# Patient Record
Sex: Male | Born: 1960 | Race: Black or African American | Hispanic: No | Marital: Married | Smoking: Former smoker
Health system: Southern US, Community
[De-identification: ages and names within clinical notes are randomized; demographics above are authoritative.]

## PROBLEM LIST (undated history)

## (undated) DIAGNOSIS — E669 Obesity, unspecified: Secondary | ICD-10-CM

## (undated) DIAGNOSIS — E7800 Pure hypercholesterolemia, unspecified: Secondary | ICD-10-CM

## (undated) DIAGNOSIS — R7989 Other specified abnormal findings of blood chemistry: Secondary | ICD-10-CM

## (undated) DIAGNOSIS — E119 Type 2 diabetes mellitus without complications: Secondary | ICD-10-CM

## (undated) DIAGNOSIS — G4733 Obstructive sleep apnea (adult) (pediatric): Secondary | ICD-10-CM

## (undated) DIAGNOSIS — I428 Other cardiomyopathies: Secondary | ICD-10-CM

## (undated) DIAGNOSIS — R945 Abnormal results of liver function studies: Secondary | ICD-10-CM

## (undated) DIAGNOSIS — Z8616 Personal history of COVID-19: Secondary | ICD-10-CM

## (undated) DIAGNOSIS — J15211 Pneumonia due to Methicillin susceptible Staphylococcus aureus: Secondary | ICD-10-CM

## (undated) DIAGNOSIS — R7881 Bacteremia: Secondary | ICD-10-CM

## (undated) DIAGNOSIS — I10 Essential (primary) hypertension: Secondary | ICD-10-CM

## (undated) HISTORY — DX: Bacteremia: R78.81

## (undated) HISTORY — DX: Obstructive sleep apnea (adult) (pediatric): G47.33

## (undated) HISTORY — DX: Type 2 diabetes mellitus without complications: E11.9

## (undated) HISTORY — DX: Obesity, unspecified: E66.9

## (undated) HISTORY — DX: Pneumonia due to methicillin susceptible Staphylococcus aureus: J15.211

## (undated) HISTORY — PX: FOOT SURGERY: SHX648

## (undated) HISTORY — DX: Other specified abnormal findings of blood chemistry: R79.89

## (undated) HISTORY — DX: Pure hypercholesterolemia, unspecified: E78.00

## (undated) HISTORY — DX: Abnormal results of liver function studies: R94.5

## (undated) HISTORY — DX: Essential (primary) hypertension: I10

---

## 2000-08-14 ENCOUNTER — Ambulatory Visit (HOSPITAL_COMMUNITY): Admission: RE | Admit: 2000-08-14 | Discharge: 2000-08-14 | Payer: Self-pay | Admitting: Internal Medicine

## 2000-08-14 ENCOUNTER — Encounter: Payer: Self-pay | Admitting: Internal Medicine

## 2006-01-22 ENCOUNTER — Encounter: Admission: RE | Admit: 2006-01-22 | Discharge: 2006-02-10 | Payer: Self-pay | Admitting: Sports Medicine

## 2006-08-19 ENCOUNTER — Encounter: Admission: RE | Admit: 2006-08-19 | Discharge: 2006-08-19 | Payer: Self-pay | Admitting: Internal Medicine

## 2006-11-23 ENCOUNTER — Ambulatory Visit (HOSPITAL_COMMUNITY): Admission: RE | Admit: 2006-11-23 | Discharge: 2006-11-23 | Payer: Self-pay | Admitting: Interventional Cardiology

## 2006-12-06 ENCOUNTER — Ambulatory Visit (HOSPITAL_BASED_OUTPATIENT_CLINIC_OR_DEPARTMENT_OTHER): Admission: RE | Admit: 2006-12-06 | Discharge: 2006-12-06 | Payer: Self-pay | Admitting: Internal Medicine

## 2006-12-12 ENCOUNTER — Ambulatory Visit: Payer: Self-pay | Admitting: Internal Medicine

## 2006-12-18 ENCOUNTER — Ambulatory Visit (HOSPITAL_BASED_OUTPATIENT_CLINIC_OR_DEPARTMENT_OTHER): Admission: RE | Admit: 2006-12-18 | Discharge: 2006-12-18 | Payer: Self-pay | Admitting: Internal Medicine

## 2006-12-27 ENCOUNTER — Ambulatory Visit: Payer: Self-pay | Admitting: Internal Medicine

## 2006-12-29 ENCOUNTER — Ambulatory Visit: Payer: Self-pay | Admitting: Internal Medicine

## 2006-12-29 LAB — CONVERTED CEMR LAB
BUN: 9 mg/dL (ref 6–23)
Basophils Absolute: 0 10*3/uL (ref 0.0–0.1)
Basophils Relative: 0 % (ref 0.0–1.0)
CO2: 29 meq/L (ref 19–32)
Creatinine, Ser: 1.3 mg/dL (ref 0.4–1.5)
GFR calc Af Amer: 76 mL/min
HCT: 39.3 % (ref 39.0–52.0)
Hemoglobin: 13.2 g/dL (ref 13.0–17.0)
Lymphocytes Relative: 31.2 % (ref 12.0–46.0)
MCHC: 33.5 g/dL (ref 30.0–36.0)
Monocytes Absolute: 0.5 10*3/uL (ref 0.2–0.7)
Monocytes Relative: 5.4 % (ref 3.0–11.0)
Neutro Abs: 5.3 10*3/uL (ref 1.4–7.7)
Neutrophils Relative %: 59.5 % (ref 43.0–77.0)
Potassium: 3.8 meq/L (ref 3.5–5.1)
Pro B Natriuretic peptide (BNP): 16 pg/mL (ref 0.0–100.0)
RDW: 13.8 % (ref 11.5–14.6)
Sodium: 141 meq/L (ref 135–145)

## 2007-02-09 DIAGNOSIS — E119 Type 2 diabetes mellitus without complications: Secondary | ICD-10-CM | POA: Insufficient documentation

## 2007-02-09 DIAGNOSIS — J45909 Unspecified asthma, uncomplicated: Secondary | ICD-10-CM | POA: Insufficient documentation

## 2007-02-09 DIAGNOSIS — I209 Angina pectoris, unspecified: Secondary | ICD-10-CM | POA: Insufficient documentation

## 2007-02-09 DIAGNOSIS — I10 Essential (primary) hypertension: Secondary | ICD-10-CM

## 2007-02-09 DIAGNOSIS — E785 Hyperlipidemia, unspecified: Secondary | ICD-10-CM | POA: Insufficient documentation

## 2007-02-09 DIAGNOSIS — G4733 Obstructive sleep apnea (adult) (pediatric): Secondary | ICD-10-CM | POA: Insufficient documentation

## 2007-02-09 HISTORY — DX: Essential (primary) hypertension: I10

## 2008-04-20 ENCOUNTER — Inpatient Hospital Stay (HOSPITAL_COMMUNITY): Admission: EM | Admit: 2008-04-20 | Discharge: 2008-04-22 | Payer: Self-pay | Admitting: Emergency Medicine

## 2008-09-07 ENCOUNTER — Emergency Department (HOSPITAL_COMMUNITY): Admission: EM | Admit: 2008-09-07 | Discharge: 2008-09-07 | Payer: Self-pay | Admitting: Emergency Medicine

## 2009-10-14 ENCOUNTER — Emergency Department (HOSPITAL_COMMUNITY): Admission: EM | Admit: 2009-10-14 | Discharge: 2009-10-14 | Payer: Self-pay | Admitting: Emergency Medicine

## 2010-06-15 LAB — RAPID STREP SCREEN (MED CTR MEBANE ONLY): Streptococcus, Group A Screen (Direct): POSITIVE — AB

## 2010-07-08 LAB — POCT I-STAT, CHEM 8
Calcium, Ion: 1.07 mmol/L — ABNORMAL LOW (ref 1.12–1.32)
Creatinine, Ser: 1.2 mg/dL (ref 0.4–1.5)
Glucose, Bld: 183 mg/dL — ABNORMAL HIGH (ref 70–99)
HCT: 41 % (ref 39.0–52.0)
Hemoglobin: 13.9 g/dL (ref 13.0–17.0)
Potassium: 3.4 mEq/L — ABNORMAL LOW (ref 3.5–5.1)
TCO2: 21 mmol/L (ref 0–100)

## 2010-07-08 LAB — CBC
HCT: 38.1 % — ABNORMAL LOW (ref 39.0–52.0)
Hemoglobin: 13.1 g/dL (ref 13.0–17.0)
MCHC: 34.2 g/dL (ref 30.0–36.0)
MCV: 84.3 fL (ref 78.0–100.0)
RBC: 4.52 MIL/uL (ref 4.22–5.81)
RDW: 14.6 % (ref 11.5–15.5)

## 2010-07-15 LAB — GLUCOSE, CAPILLARY
Glucose-Capillary: 252 mg/dL — ABNORMAL HIGH (ref 70–99)
Glucose-Capillary: 283 mg/dL — ABNORMAL HIGH (ref 70–99)
Glucose-Capillary: 284 mg/dL — ABNORMAL HIGH (ref 70–99)
Glucose-Capillary: 288 mg/dL — ABNORMAL HIGH (ref 70–99)
Glucose-Capillary: 300 mg/dL — ABNORMAL HIGH (ref 70–99)

## 2010-07-15 LAB — BASIC METABOLIC PANEL
BUN: 14 mg/dL (ref 6–23)
CO2: 27 mEq/L (ref 19–32)
Calcium: 9.5 mg/dL (ref 8.4–10.5)
Chloride: 96 mEq/L (ref 96–112)
Creatinine, Ser: 1.44 mg/dL (ref 0.4–1.5)
Creatinine, Ser: 1.48 mg/dL (ref 0.4–1.5)
GFR calc Af Amer: >60 mL/min (ref >60)
GFR calc non Af Amer: 52 mL/min — ABNORMAL LOW (ref >60)
Glucose, Bld: 277 mg/dL — ABNORMAL HIGH (ref 70–99)
Glucose, Bld: 307 mg/dL — ABNORMAL HIGH (ref 70–99)
Potassium: 4.5 mEq/L (ref 3.5–5.1)
Sodium: 133 mEq/L — ABNORMAL LOW (ref 135–145)

## 2010-07-15 LAB — HEMOGLOBIN A1C: Mean Plasma Glucose: 235 mg/dL

## 2010-07-15 LAB — DIFFERENTIAL
Basophils Absolute: 0 10*3/uL (ref 0.0–0.1)
Eosinophils Relative: 1 % (ref 0–5)
Lymphocytes Relative: 19 % (ref 12–46)
Lymphs Abs: 2.4 10*3/uL (ref 0.7–4.0)
Neutrophils Relative %: 76 % (ref 43–77)

## 2010-07-15 LAB — CBC
HCT: 42.7 % (ref 39.0–52.0)
Platelets: 408 10*3/uL — ABNORMAL HIGH (ref 150–400)
RDW: 14.6 % (ref 11.5–15.5)
WBC: 12.2 10*3/uL — ABNORMAL HIGH (ref 4.0–10.5)

## 2010-08-13 NOTE — Op Note (Signed)
NAMEGEORGES, Kevin Long NO.:  [id number]   MEDICAL RECORD NO.:  [id number]          PATIENT TYPE:  OBV   LOCATION:  5125                         FACILITY:  [facility]   PHYSICIAN:  Mark C. Ophelia Charter, M.D.    DATE OF BIRTH:  [birth year]   DATE OF PROCEDURE:  DATE OF DISCHARGE:                               OPERATIVE REPORT   PREOPERATIVE DIAGNOSIS:  Right foot tarsometatarsal fracture  dislocation, first through fifth.   POSTOPERATIVE DIAGNOSIS:  Right foot tarsometatarsal fracture  dislocation, first through fifth.   PROCEDURE:  Open reduction and internal fixation of right  tarsometatarsal fracture dislocation first to fifth (Lisfranc joint).   SURGEON:  Mark C. Ophelia Charter, MD   ASSISTANT:  Wende Neighbors, PA-C   TOURNIQUET TIME:  41 minutes.   DRAINS:  None.   BRIEF HISTORY:  This male diabetic patient injured his foot when his  knee gave way, noticed severe deformity of his foot with prominence of  the bone on the dorsum of the foot and he states 45-90 degrees  angulation.  He has some degree of diabetic neuropathy reached down and  reduced it himself and then was brought to the emergency room.  X-rays  demonstrate tarsometatarsal dislocation.  He is placed in a compressive  soft dressing with posterior splint, admitted for stabilization.   After standard prepping and draping with a proximal thigh tourniquet,  regular stockinette was applied, cervical checklist was completed, 2 g  of Ancef was given prophylactically.  A sterile glove placed over the  toes, sterile skin marker was used in the dorsum of the foot, and  incision was made between first and second, proximal metatarsals  extended up over the tarsal bones and retinaculum was split.  Branch of  the superficial peroneal were carefully mobilized as well as extensor  tendon.  Joint showed dislocation of the tarsometatarsal joint, widening  of the Lisfranc joint as expected and initially the first  tarsometatarsal joint was reduced, held with K-wire and 4.5 cannulated  screw fully threaded was placed 24 mm.  As it was being tightened down,  a small crack occurred in the cortex.  A second screw was then placed  from the second metatarsal back into the first cuneiform reducing  Lisfranc anatomically and then another was placed between the first and  second metatarsal bones in the metaphyseal region for stabilization  keeping the second over against the first without over tightening.  This  was not a compressive screw and was neutral.  Next, the incision was  made over the third-fourth metatarsal-tarsal joint interval, third and  fourth were reduced, there were still floppy mobile reduced and were  fixed with another cannulated screw, another one was placed in for  stabilizing, the fifth was reduced pullover by the fourth and did not  require a screw but was openly reduced in order to get the fourth over  and then fixed.  AP, lateral, and oblique pictures were taken confirming  satisfactory position with reduction and fixation.  All screws were in  good position, appropriate lengths on x-ray and after irrigation  with  saline solution, tourniquet was deflated after 41 minutes, skin was  closed with 3-0 nylon.  Xeroform, Marcaine infiltration minimally since his ASO neuropathy,  bulky soft the Robert-Jones dressing was applied.  The patient was  transferred recovery room in stable condition.  Instrument count and  needle count was correct.      Mark C. Ophelia Charter, M.D.  Electronically Signed     MCY/MEDQ  D:  04/20/2008  T:  04/21/2008  Job:  [id number]

## 2010-08-13 NOTE — Procedures (Signed)
NAME:  Kevin Long, Kevin Long NO.:  [id number]   MEDICAL RECORD NO.:  [id number]          PATIENT TYPE:  OUT   LOCATION:  SLEEP CENTER                 FACILITY:  [facility]   PHYSICIAN:  Clinton D. Maple Hudson, MD, FCCP, FACPDATE OF BIRTH:  [birth year] 22, 1960   DATE OF STUDY:  12/06/2006                            NOCTURNAL POLYSOMNOGRAM   REFERRING PHYSICIAN:  Fleet Contras, M.D.   INDICATION FOR STUDY:  Hypersomnia with sleep apnea.   EPWORTH SLEEPINESS SCORE:  16/24, height 5 feet 6 inches, weight 246  pounds.   MEDICATIONS:  Home medications are listed and reviewed.   Diagnostic NPSG protocol ordered.   SLEEP ARCHITECTURE:  Total sleep time 269 minutes with sleep efficiency  71%.  Stage I was 27%, stage II 73%, stages III and REM were absent.  Sleep latency 6 minutes, awake after sleep onset 101 minutes, arousal  index 27.8, indicating increased EEG arousal.  The sleep architecture  was marked by frequent wakings consistent with respiratory arousals  through the study.  No bedtime medication was reported.   RESPIRATORY DATA:  Diagnostic NPSG protocol.  Apnea-hypopnea index (AHI,  RDI) 60.1 obstructive events per hour, indicating severe obstructive  sleep apnea/hypopnea syndrome.  There were 27 obstructive apneas and 243  hypopneas.  Events were significantly associated with supine and right  side sleep positions.   OXYGEN DATA:  Loud snoring with oxygen desaturation nadir 82%.  Mean  oxygen saturation through the study 96% on room air.   CARDIAC DATA:  Normal sinus rhythm.   MOVEMENT-PARASOMNIA:  No significant movement disturbance.   IMPRESSIONS-RECOMMENDATIONS:  1. Severe obstructive sleep apnea/hypopnea syndrome, apnea-hypopnea      index 60.1 per hour with nonpositional events, very loud snoring      and oxygen desaturation nadir of 82%.  2. Frequent waking through the study consistent with respiratory      arousal.  3. Consider return for continuous positive airway  pressure titration      or evaluate for alternative therapies as appropriate.      Clinton D. Maple Hudson, MD, [facility], FACP  Diplomate, Biomedical engineer of Sleep Medicine  Electronically Signed     CDY/MEDQ  D:  12/12/2006 14:16:24  T:  12/13/2006 12:17:16  Job:  [id number]

## 2010-08-13 NOTE — Procedures (Signed)
NAME:  Kevin Long, GULINO NO.:  [id number]   MEDICAL RECORD NO.:  [id number]          PATIENT TYPE:  OUT   LOCATION:  SLEEP CENTER                 FACILITY:  [facility]   PHYSICIAN:  Clinton D. Maple Hudson, MD, FCCP, FACPDATE OF BIRTH:  29-Apr-1960   DATE OF STUDY:  12/18/2006                            NOCTURNAL POLYSOMNOGRAM   REFERRING PHYSICIAN:  Fleet Contras, M.D.   INDICATION FOR STUDY:  Hypersomnia with sleep apnea.   EPWORTH SLEEPINESS SCORE:  16/24, BMI 39.4, weight 246 pounds.   HOME MEDICATIONS:  Listed and reviewed.   A diagnostic NPSG on December 06, 2006, recorded an AHI of 60.1 per  hour.  CPAP titration is requested.   SLEEP ARCHITECTURE:  Total sleep time 335 minutes.  The sleep efficiency  85%.  Stage I was 10%, stage II 71%, stage III 6%, REM 13% of total  sleep time.  Sleep latency 12 minutes.  REM latency 98 minutes.  Awake  after sleep onset 49 minutes.  Arousal index 7.2.  No bedtime medication  was taken.   RESPIRATORY DATA:  CPAP titration protocol.  CPAP was titrated to 14-  CWP, AHI 1.5 per hour.  A medium Respironics Comfort Gel mask was chosen  with heated humidifier.   OXYGEN DATA:  Storing was prevented by CPAP and oxygen saturation held  at 97% on room air.   CARDIAC DATA:  Normal sinus rhythm.   MOVEMENT-PARASOMNIA:  No significant movement disturbance, bathroom x1.   IMPRESSIONS-RECOMMENDATIONS:  1. Successful CPAP titration to 14-CWP, apnea/hypopnea index 1.5 per      hour.  A medium Respironics Comfort Gel mask was chosen with heated      humidifier.  2. Baseline diagnostic nocturnal polysomnogram on December 06, 2006,      had recorded an apnea/hypopnea index of 60.1 per hour.     Clinton D. Maple Hudson, MD, [facility], FACP  Diplomate, Biomedical engineer of Sleep Medicine  Electronically Signed    CDY/MEDQ  D:  12/27/2006 09:45:03  T:  12/27/2006 12:24:22  Job:  [id number]

## 2012-07-06 ENCOUNTER — Encounter: Payer: Self-pay | Admitting: Cardiology

## 2012-07-11 ENCOUNTER — Encounter (HOSPITAL_COMMUNITY): Payer: Self-pay | Admitting: Emergency Medicine

## 2012-07-11 ENCOUNTER — Emergency Department (HOSPITAL_COMMUNITY)
Admission: EM | Admit: 2012-07-11 | Discharge: 2012-07-11 | Disposition: A | Discharge status: Home / Self Care | Payer: Medicaid Other | Attending: Emergency Medicine | Admitting: Emergency Medicine

## 2012-07-11 ENCOUNTER — Emergency Department (HOSPITAL_COMMUNITY): Payer: Medicaid Other

## 2012-07-11 DIAGNOSIS — I10 Essential (primary) hypertension: Secondary | ICD-10-CM | POA: Insufficient documentation

## 2012-07-11 DIAGNOSIS — Z79899 Other long term (current) drug therapy: Secondary | ICD-10-CM | POA: Insufficient documentation

## 2012-07-11 DIAGNOSIS — E7800 Pure hypercholesterolemia, unspecified: Secondary | ICD-10-CM | POA: Insufficient documentation

## 2012-07-11 DIAGNOSIS — Z794 Long term (current) use of insulin: Secondary | ICD-10-CM | POA: Insufficient documentation

## 2012-07-11 DIAGNOSIS — E669 Obesity, unspecified: Secondary | ICD-10-CM | POA: Insufficient documentation

## 2012-07-11 DIAGNOSIS — I509 Heart failure, unspecified: Secondary | ICD-10-CM

## 2012-07-11 DIAGNOSIS — E119 Type 2 diabetes mellitus without complications: Secondary | ICD-10-CM | POA: Insufficient documentation

## 2012-07-11 DIAGNOSIS — R002 Palpitations: Secondary | ICD-10-CM | POA: Insufficient documentation

## 2012-07-11 DIAGNOSIS — R071 Chest pain on breathing: Secondary | ICD-10-CM | POA: Insufficient documentation

## 2012-07-11 DIAGNOSIS — Z87891 Personal history of nicotine dependence: Secondary | ICD-10-CM | POA: Insufficient documentation

## 2012-07-11 DIAGNOSIS — G4733 Obstructive sleep apnea (adult) (pediatric): Secondary | ICD-10-CM | POA: Insufficient documentation

## 2012-07-11 DIAGNOSIS — I252 Old myocardial infarction: Secondary | ICD-10-CM | POA: Insufficient documentation

## 2012-07-11 DIAGNOSIS — J45909 Unspecified asthma, uncomplicated: Secondary | ICD-10-CM | POA: Insufficient documentation

## 2012-07-11 LAB — BASIC METABOLIC PANEL WITH GFR
BUN: 10 mg/dL (ref 6–23)
CO2: 24 meq/L (ref 19–32)
Chloride: 102 meq/L (ref 96–112)
GFR calc Af Amer: >90 mL/min (ref >90)
Glucose, Bld: 168 mg/dL — ABNORMAL HIGH (ref 70–99)
Potassium: 3.8 meq/L (ref 3.5–5.1)

## 2012-07-11 LAB — CBC WITH DIFFERENTIAL/PLATELET
Basophils Absolute: 0.1 10*3/uL (ref 0.0–0.1)
Basophils Relative: 1 % (ref 0–1)
Eosinophils Absolute: 0.3 10*3/uL (ref 0.0–0.7)
Eosinophils Relative: 3 % (ref 0–5)
HCT: 37.4 % — ABNORMAL LOW (ref 39.0–52.0)
Hemoglobin: 13.2 g/dL (ref 13.0–17.0)
Lymphocytes Relative: 24 % (ref 12–46)
Lymphs Abs: 2.4 K/uL (ref 0.7–4.0)
MCH: 27.9 pg (ref 26.0–34.0)
MCHC: 35.3 g/dL (ref 30.0–36.0)
MCV: 79.1 fL (ref 78.0–100.0)
Monocytes Absolute: 0.7 K/uL (ref 0.1–1.0)
Monocytes Relative: 7 % (ref 3–12)
Neutro Abs: 6.6 K/uL (ref 1.7–7.7)
Neutrophils Relative %: 66 % (ref 43–77)
Platelets: 399 10*3/uL (ref 150–400)
RBC: 4.73 MIL/uL (ref 4.22–5.81)
RDW: 13.9 % (ref 11.5–15.5)
WBC: 10 K/uL (ref 4.0–10.5)

## 2012-07-11 LAB — BASIC METABOLIC PANEL
Calcium: 9.1 mg/dL (ref 8.4–10.5)
Creatinine, Ser: 0.93 mg/dL (ref 0.50–1.35)
GFR calc non Af Amer: >90 mL/min (ref >90)
Sodium: 137 mEq/L (ref 135–145)

## 2012-07-11 LAB — POCT I-STAT TROPONIN I: Troponin i, poc: 0.02 ng/mL (ref 0.00–0.08)

## 2012-07-11 LAB — PRO B NATRIURETIC PEPTIDE: Pro B Natriuretic peptide (BNP): 261 pg/mL — ABNORMAL HIGH (ref 0–125)

## 2012-07-11 MED ORDER — FUROSEMIDE 20 MG PO TABS: 20.0000 mg | ORAL_TABLET | Freq: Two times a day (BID) | ORAL | Status: DC

## 2012-07-11 MED ORDER — NITROGLYCERIN 0.4 MG SL SUBL
0.4000 mg | SUBLINGUAL_TABLET | Freq: Once | SUBLINGUAL | Status: AC
  Administered 2012-07-11: 0.4 mg via SUBLINGUAL
  Filled 2012-07-11: qty 25

## 2012-07-11 MED ORDER — IPRATROPIUM BROMIDE 0.02 % IN SOLN
0.5000 mg | Freq: Once | RESPIRATORY_TRACT | Status: AC
  Administered 2012-07-11: 0.5 mg via RESPIRATORY_TRACT
  Filled 2012-07-11: qty 2.5

## 2012-07-11 MED ORDER — ASPIRIN 81 MG PO CHEW
162.0000 mg | CHEWABLE_TABLET | Freq: Once | ORAL | Status: AC
  Administered 2012-07-11: 162 mg via ORAL
  Filled 2012-07-11: qty 2

## 2012-07-11 MED ORDER — FUROSEMIDE 10 MG/ML IJ SOLN
40.0000 mg | Freq: Once | INTRAMUSCULAR | Status: AC
  Administered 2012-07-11: 40 mg via INTRAVENOUS
  Filled 2012-07-11: qty 4

## 2012-07-11 MED ORDER — ALBUTEROL SULFATE (5 MG/ML) 0.5% IN NEBU
5.0000 mg | INHALATION_SOLUTION | Freq: Once | RESPIRATORY_TRACT | Status: AC
  Administered 2012-07-11: 5 mg via RESPIRATORY_TRACT
  Filled 2012-07-11: qty 1

## 2012-07-11 NOTE — ED Provider Notes (Signed)
History     CSN: [id number]  Arrival date & time 07/11/12  [id number]   First MD Initiated Contact with Patient 07/11/12 [phone]      Chief Complaint  Patient presents with  . Shortness of Breath    (Consider location/radiation/quality/duration/timing/severity/associated sxs/prior treatment) HPI Comments: Patient with history of mild congestive heart failure based on his history with a low left ventricular output according to the patient with possible history of MI and what sounds like diagnostic after the fact by perfusion study presents with 3 or 4 day history of gradually worsening chest pressure intermittently associated with shortness of breath worse with exertion. Patient does not think he's gained much weight. He is obese. He also has a history of asthma and quit smoking approximately 7 years ago. He also has history of hypertension and diabetes. He denies any fever or chills but does have a family sick contacts recently had pneumonia. He did not have his pneumonia shots nor A. Influenza vaccination this season. Patient has not spoken to his primary care physician Dr. Concepcion Elk.  He has a history of sleep apnea but did to a short circuit of the machine, and is not currently working he has not used his machine in quite some time. Patient and his spouse reports the patient has had more difficulty laying flat and sleeping at nighttime. He reports even walking short distances in the last one to 2 days he gets extremely short of breath and has had significant heart palpitations when he does try to exert himself. He is tried his albuterol inhaler without any significant relief the chest pressure remains in his anterior chest wall, does not radiate to his arms, jaw, back. No nausea vomiting or diaphoresis.  The history is provided by the patient, the spouse and medical records.    Past Medical History  Diagnosis Date  . Diabetes mellitus, type 2   . Hypercholesterolemia   . Hypertension   . Asthma   .  Obesity   . OSA (obstructive sleep apnea)     Past Surgical History  Procedure Laterality Date  . Foot surgery      Family History  Problem Relation Age of Onset  . Diabetes Mother   . Anxiety disorder Brother   . Depression Son   . Heart murmur Son     History  Substance Use Topics  . Smoking status: Former Smoker    Quit date: 03/31/2005  . Smokeless tobacco: Not on file  . Alcohol Use: No      Review of Systems  Constitutional: Negative for fever, chills and diaphoresis.  HENT: Negative for congestion and rhinorrhea.   Respiratory: Positive for chest tightness and shortness of breath. Negative for wheezing.   Cardiovascular: Positive for palpitations. Negative for leg swelling.  Gastrointestinal: Negative for nausea, vomiting and abdominal pain.  Musculoskeletal: Negative for back pain.  All other systems reviewed and are negative.    Allergies  Review of patient's allergies indicates no known allergies.  Home Medications   Current Outpatient Rx  Name  Route  Sig  Dispense  Refill  . albuterol (PROVENTIL HFA;VENTOLIN HFA) 108 (90 BASE) MCG/ACT inhaler   Inhalation   Inhale 2 puffs into the lungs every 4 (four) hours as needed for wheezing or shortness of breath.          . allopurinol (ZYLOPRIM) 100 MG tablet   Oral   Take 100 mg by mouth daily.          Marland Kitchen  amLODipine (NORVASC) 5 MG tablet   Oral   Take 5 mg by mouth daily.         . cholecalciferol (VITAMIN D) 1000 UNITS tablet   Oral   Take 1,000 Units by mouth daily.         Marland Kitchen EPINEPHrine (AUVI-Q) 0.3 mg/0.3 mL DEVI   Intramuscular   Inject 0.3 mg into the muscle once.         . ezetimibe-simvastatin (VYTORIN) 10-20 MG per tablet   Oral   Take 1 tablet by mouth at bedtime.         . fluticasone (FLONASE) 50 MCG/ACT nasal spray   Nasal   Place 2 sprays into the nose daily.         Marland Kitchen guaiFENesin (MUCINEX) 600 MG 12 hr tablet   Oral   Take 600 mg by mouth 2 (two) times daily  as needed for congestion.         . hydrochlorothiazide (MICROZIDE) 12.5 MG capsule   Oral   Take 12.5 mg by mouth daily.         . insulin glargine (LANTUS) 100 UNIT/ML injection   Subcutaneous   Inject 25 Units into the skin at bedtime.         Marland Kitchen omeprazole (PRILOSEC) 20 MG capsule   Oral   Take 20 mg by mouth daily as needed (gerd).          . sitaGLIPtan-metformin (JANUMET) 50-500 MG per tablet   Oral   Take 1 tablet by mouth 2 (two) times daily with a meal.            BP 119/80  Pulse 85  Temp(Src) 98.2 F (36.8 C) (Oral)  Resp 14  SpO2 97%  Physical Exam  Nursing note and vitals reviewed. Constitutional: He is oriented to person, place, and time. He appears well-developed and well-nourished. No distress.  HENT:  Head: Normocephalic and atraumatic.  Eyes: EOM are normal. No scleral icterus.  Neck: No JVD present.  Cardiovascular: Normal rate and regular rhythm.  Exam reveals no friction rub.   No murmur heard. Pulmonary/Chest: Effort normal. No stridor. No respiratory distress. He has no wheezes. He has rales.  Abdominal: Soft. He exhibits no distension. There is no tenderness. There is no rebound and no guarding.  Musculoskeletal: He exhibits no edema and no tenderness.  Neurological: He is alert and oriented to person, place, and time. Coordination normal.  Skin: Skin is warm and dry. No rash noted. He is not diaphoretic.  Psychiatric: He has a normal mood and affect.    ED Course  Procedures (including critical care time)  Labs Reviewed  CBC WITH DIFFERENTIAL - Abnormal; Notable for the following:    HCT 37.4 (*)    All other components within normal limits  BASIC METABOLIC PANEL - Abnormal; Notable for the following:    Glucose, Bld 168 (*)    All other components within normal limits  PRO B NATRIURETIC PEPTIDE - Abnormal; Notable for the following:    Pro B Natriuretic peptide (BNP) 261.0 (*)    All other components within normal limits   POCT I-STAT TROPONIN I   Dg Abd Acute W/chest  07/11/2012  *RADIOLOGY REPORT*  Clinical Data: Chest pain.  Shortness of breath.  Fluid retention. Epigastric abdominal pain.  Constipation.  ACUTE ABDOMEN SERIES (ABDOMEN 2 VIEW & CHEST 1 VIEW)  Comparison: No prior abdominal imaging.  Two-view chest x-ray 09/07/2008.  Findings: Bowel gas pattern unremarkable  without evidence of obstruction or significant ileus.  No evidence of free air or significant air fluid levels on the erect image.  No visible opaque urinary tract calculi.  Mild degenerative changes involving the lumbar spine.  Cardiac silhouette upper normal in size, unchanged.  Hilar and mediastinal contours otherwise unremarkable.  Diffuse interstitial pulmonary opacities with Kerley B lines.  No pleural effusions.  IMPRESSION:  1.  No acute abdominal abnormality. 2.  Borderline heart size.  Mild diffuse interstitial opacities consistent with pulmonary edema.   Original Report Authenticated By: Hulan Saas, M.D.      1. CHF (congestive heart failure)     Room air saturation 97% which I interpret to be normal.  ECG at time of 09:29, shows sinus rhythm at a rate of 89, normal axis, nonspecific inverted T waves in anterior leads and lead I and AVL, and no ST changes.  Interpretation is borderline ECG.  No prior ECG's are available.    11:45 AM Initial troponin is neg.  Pt had no sig improvement with alb atrovent nebs.  CXR and elevated BNP suggest CHF as cause.  Will give SL NTG and IV lasix here and continue to monitor for improvement.     1:10 PM Pt feels improved after NTG and lasix, has diuresed, able to ambulate around hallway maintaining RA sats at 95% or higher, HR up to 110, but no CP, or sig SOB.     1:50 PM Spoke to Dr. Anne Fu who agrees that pt can be seen back in cardiology office as outpt this week,   MDM  Pt with chest pressure and exertional DOE, some orthopnea by history.  Could be OSA and non compliance but gien  possible h/o MI int he past, will likely need admission and observation in the ED.  Pt has not seen a cardiologist in about 5 years. Pt had a perfusion study scheduled in 2011, but it seems it was never completed.  He had one in 2008, showing EF of 44% and fixed anterior defect.  ECG shows no acute ischemia.  Will give nebs, ASA, rule out with enzymes, obtain CXR and BNP.  Pt has normal sats, normotensive currently, no need for immediate NTG.          Gavin Pound. Oletta Lamas, MD 07/13/12 [phone]

## 2012-07-11 NOTE — ED Notes (Signed)
Ghim, MD at bedside. 

## 2012-07-11 NOTE — ED Notes (Signed)
Pt in from home via POV, pt c/o increasing SOB onset x 1 wk, worsening x days, pt c/o mid CP radiating into L arm onset x 4 days, pt states, "My chest started hurting after I worked in the yard." pt denies current CP, pt hx of COPD, asthma, & sleep anea, pt A&O x4, follows commands, speaks in complete sentences

## 2012-07-11 NOTE — Discharge Instructions (Signed)
Heart Failure  Heart failure (HF) is a condition in which the heart has trouble pumping blood. This means your heart does not pump blood efficiently for your body to work well. In some cases of HF, fluid may back up into your lungs or you may have swelling (edema) in your lower legs. HF is a long-term (chronic) condition. It is important for you to take good care of yourself and follow your caregiver's treatment plan.  CAUSES   · Health conditions:  · High blood pressure (hypertension) causes the heart muscle to work harder than normal. When pressure in the blood vessels is high, the heart needs to pump (contract) with more force in order to circulate blood throughout the body. High blood pressure eventually causes the heart to become stiff and weak.  · Coronary artery disease (CAD) is the buildup of cholesterol and fat (plaques) in the arteries of the heart. The blockage in the arteries deprives the heart muscle of oxygen and blood. This can cause chest pain and may lead to a heart attack. High blood pressure can also contribute to CAD.  · Heart attack (myocardial infarction) occurs when 1 or more arteries in the heart become blocked. The loss of oxygen damages the muscle tissue of the heart. When this happens, part of the heart muscle dies. The injured tissue does not contract as well and weakens the heart's ability to pump blood.  · Abnormal heart valves can cause HF when the heart valves do not open and close properly. This makes the heart muscle pump harder to keep the blood flowing.  · Heart muscle disease (cardiomyopathy or myocarditis) is damage to the heart muscle from a variety of causes. These can include drug or alcohol abuse, infections, or unknown reasons. These can increase the risk of HF.  · Lung disease makes the heart work harder because the lungs do not work properly. This can cause a strain on the heart leading it to fail.  · Diabetes increases the risk of HF. High blood sugar contributes to high  fat (lipid) levels in the blood. Diabetes can also cause slow damage to tiny blood vessels that carry important nutrients to the heart muscle. When the heart does not get enough oxygen and food, it can cause the heart to become weak and stiff. This leads to a heart that does not contract efficiently.  · Other diseases can contribute to HF. These include abnormal heart rhythms, thyroid problems, and low blood counts (anemia).  · Unhealthy lifestyle habits:  · Obesity.  · Smoking.  · Eating foods high in fat and cholesterol.  · Eating or drinking beverages high in salt.  · Drug or alcohol abuse.  · Lack of exercise.  SYMPTOMS   HF symptoms may vary and can be hard to detect. Symptoms may include:  · Shortness of breath with activity, such as climbing stairs.  · Persistent cough.  · Swelling of the feet, ankles, legs, or abdomen.  · Unexplained weight gain.  · Difficulty breathing when lying flat.  · Waking from sleep because of the need to sit up and get more air.  · Rapid heartbeat.  · Fatigue and loss of energy.  · Feeling lightheaded or close to fainting.  DIAGNOSIS   A diagnosis of HF is based on your history, symptoms, physical examination, and diagnostic tests.  Diagnostic tests for HF may include:  · EKG.  · Chest X-ray.  · Blood tests.  · Exercise stress test.  · Blood   oxygen test (arterial blood gas).  · Evaluation by a heart doctor (cardiologist).  · Ultrasound evaluation of the heart (echocardiogram).  · Heart artery test to look for blockages (angiogram).  · Radioactive imaging to look at the heart (radionuclide test).  TREATMENT   Treatment is aimed at managing the symptoms of HF. Medicines, lifestyle changes, or surgical intervention may be necessary to treat HF.  · Medicines to help treat HF may include:  · Angiotensin-converting enzyme (ACE) inhibitors. These block the effects of a blood protein called angiotensin-converting enzyme. ACE inhibitors relax (dilate) the blood vessels and help lower blood  pressure. This decreases the workload of the heart, slows the progression of HF, and improves symptoms.  · Angiotensin receptor blockers (ARBs). These medications work similar to ACE inhibitors. ARBs may be an alternative for people who cannot tolerate an ACE inhibitor.  · Aldosterone antagonists. This medication helps get rid of extra fluid from your body. This lowers the volume of blood the heart has to pump.  · Water pills (diuretics). Diuretics cause the kidneys to remove salt and water from the blood. The extra fluid is removed by urination. By removing extra fluid from the body, diuretics help lower the workload of the heart and help prevent fluid buildup in the lungs so breathing is easier.  · Beta blockers. These prevent the heart from beating too fast and improve heart muscle strength. Beta blockers help maintain a normal heart rate, control blood pressure, and improve HF symptoms.  · Digitalis. This increases the force of the heartbeat and may be helpful to people with HF or heart rhythm problems.  · Healthy lifestyle changes include:  · Stopping smoking.  · Eating a healthy diet. Avoid foods high in fat. Avoid foods fried in oil or made with fat. A dietician can help with healthy food choices.  · Limiting how much salt you eat.  · Limiting alcohol intake to no more than 1 drink per day for women and 2 drinks per day for men. Drinking more than that is harmful to your heart. If your heart has already been damaged by alcohol or you have severe HF, drinking alcohol should be stopped completely.  · Exercising as directed by your caregiver.  · Surgical treatment for HF may include:  · Procedures to open blocked arteries, repair damaged heart valves, or remove damaged heart muscle tissue.  · A pacemaker to help heart muscle function and to control certain abnormal heart rhythms.  · A defibrillator to possibly prevent sudden cardiac death.  HOME CARE INSTRUCTIONS   · Activity level. Your caregiver can help you  determine what type of exercise program may be helpful. It is important to maintain your strength. Pace your physical activity to avoid shortness of breath or chest pain. Rest for 1 hour before and after meals. A cardiac rehabilitation program may be helpful to some people with HF.  · Diet. Eat a heart healthy diet. Food choices should be low in saturated fat and cholesterol. Talk to a dietician to learn about heart healthy foods.  · Salt intake. When you have HF, you need to limit the amount of salt you eat. Eat less than 1500 milligrams (mg) of salt per day or as recommended by your caregiver.  · Weight monitoring. Weigh yourself every day. You should weigh yourself in the morning after you urinate and before you eat breakfast. Wear the same amount of clothing each time you weigh yourself. Record your weight daily. Bring your recorded   weights to your clinic visits. Tell your caregiver right away if you have gained 3 lb/1.4 kg in 1 day, or 5 lb/2.3 kg in a week or whatever amount you were told to report.  · Blood pressure monitoring. This should be done as directed by your caregiver. A home blood pressure cuff can be purchased at a drugstore. Record your blood pressure numbers and bring them to your clinic visits. Tell your caregiver if you become dizzy or lightheaded upon standing up.  · Smoking. If you are currently a smoker, it is time to quit. Nicotine makes your heart work harder by causing your blood vessels to constrict. Do not use nicotine gum or patches before talking to your caregiver.  · Follow up. Be sure to schedule a follow-up visit with your caregiver. Keep all your appointments.  SEEK MEDICAL CARE IF:   · Your weight increases by 3 lb/1.4 kg in 1 day or 5 lb/2.3 kg in a week.  · You notice increasing shortness of breath that is unusual for you. This may happen during rest, sleep, or with activity.  · You cough more than normal, especially with physical activity.  · You notice more swelling in your  hands, feet, ankles, or belly (abdomen).  · You are unable to sleep because it is hard to breathe.  · You cough up bloody mucus (sputum).  · You begin to feel "jumping" or "fluttering" sensations (palpitations) in your chest.  SEEK IMMEDIATE MEDICAL CARE IF:   · You have severe chest pain or pressure which may include symptoms such as:  · Pain or pressure in the arms, neck, jaw, or back.  · Feeling sweaty.  · Feeling sick to your stomach (nauseous).  · Feeling short of breath while at rest.  · Having a fast or irregular heartbeat.  · You experience stroke symptoms. These symptoms include:  · Facial weakness or numbness.  · Weakness or numbness in an arm, leg, or on one side of your body.  · Blurred vision.  · Difficulty talking or thinking.  · Dizziness or fainting.  · Severe headache.  MAKE SURE YOU:   · Understand these instructions.  · Will watch your condition.  · Will get help right away if you are not doing well or get worse.  Document Released: 03/17/2005 Document Revised: 09/16/2011 Document Reviewed: 06/29/2009  [employer]® Patient Information ©2013 [employer].

## 2012-07-29 ENCOUNTER — Other Ambulatory Visit: Payer: Self-pay | Admitting: Interventional Cardiology

## 2012-07-29 ENCOUNTER — Encounter (HOSPITAL_COMMUNITY): Payer: Self-pay | Admitting: Pharmacy Technician

## 2012-08-04 ENCOUNTER — Encounter (HOSPITAL_COMMUNITY): Payer: Self-pay

## 2012-08-04 ENCOUNTER — Encounter (HOSPITAL_COMMUNITY)
Admission: RE | Disposition: A | Discharge status: Home / Self Care | Payer: Self-pay | Source: Ambulatory Visit | Attending: Interventional Cardiology

## 2012-08-04 ENCOUNTER — Inpatient Hospital Stay (HOSPITAL_BASED_OUTPATIENT_CLINIC_OR_DEPARTMENT_OTHER): Admit: 2012-08-04 | Payer: Self-pay | Admitting: Interventional Cardiology

## 2012-08-04 ENCOUNTER — Encounter (HOSPITAL_BASED_OUTPATIENT_CLINIC_OR_DEPARTMENT_OTHER): Payer: Self-pay

## 2012-08-04 ENCOUNTER — Ambulatory Visit (HOSPITAL_COMMUNITY)
Admission: RE | Admit: 2012-08-04 | Discharge: 2012-08-04 | Disposition: A | Discharge status: Home / Self Care | Payer: Medicaid Other | Source: Ambulatory Visit | Attending: Interventional Cardiology | Admitting: Interventional Cardiology

## 2012-08-04 DIAGNOSIS — Z794 Long term (current) use of insulin: Secondary | ICD-10-CM | POA: Insufficient documentation

## 2012-08-04 DIAGNOSIS — I10 Essential (primary) hypertension: Secondary | ICD-10-CM | POA: Insufficient documentation

## 2012-08-04 DIAGNOSIS — E119 Type 2 diabetes mellitus without complications: Secondary | ICD-10-CM | POA: Insufficient documentation

## 2012-08-04 DIAGNOSIS — Z87891 Personal history of nicotine dependence: Secondary | ICD-10-CM | POA: Insufficient documentation

## 2012-08-04 DIAGNOSIS — I428 Other cardiomyopathies: Secondary | ICD-10-CM | POA: Insufficient documentation

## 2012-08-04 DIAGNOSIS — E669 Obesity, unspecified: Secondary | ICD-10-CM | POA: Insufficient documentation

## 2012-08-04 DIAGNOSIS — I2789 Other specified pulmonary heart diseases: Secondary | ICD-10-CM | POA: Insufficient documentation

## 2012-08-04 DIAGNOSIS — Z79899 Other long term (current) drug therapy: Secondary | ICD-10-CM | POA: Insufficient documentation

## 2012-08-04 DIAGNOSIS — I509 Heart failure, unspecified: Secondary | ICD-10-CM | POA: Insufficient documentation

## 2012-08-04 HISTORY — PX: LEFT AND RIGHT HEART CATHETERIZATION WITH CORONARY ANGIOGRAM: SHX5449

## 2012-08-04 HISTORY — DX: Other cardiomyopathies: I42.8

## 2012-08-04 LAB — GLUCOSE, CAPILLARY
Glucose-Capillary: 128 mg/dL — ABNORMAL HIGH (ref 70–99)
Glucose-Capillary: 107 mg/dL — ABNORMAL HIGH (ref 70–99)

## 2012-08-04 LAB — POCT I-STAT 3, VENOUS BLOOD GAS (G3P V)
Bicarbonate: 26.2 mEq/L — ABNORMAL HIGH (ref 20.0–24.0)
O2 Saturation: 54 %
TCO2: 28 mmol/L (ref 0–100)

## 2012-08-04 LAB — POCT I-STAT 3, ART BLOOD GAS (G3+)
Bicarbonate: 24.4 mEq/L — ABNORMAL HIGH (ref 20.0–24.0)
TCO2: 26 mmol/L (ref 0–100)
pCO2 arterial: 38.4 mmHg (ref 35.0–45.0)
pH, Arterial: 7.411 (ref 7.350–7.450)
pO2, Arterial: 60 mmHg — ABNORMAL LOW (ref 80.0–100.0)

## 2012-08-04 SURGERY — JV LEFT AND RIGHT HEART CATHETERIZATION WITH CORONARY ANGIOGRAM

## 2012-08-04 SURGERY — LEFT AND RIGHT HEART CATHETERIZATION WITH CORONARY ANGIOGRAM
Anesthesia: LOCAL

## 2012-08-04 MED ORDER — ASPIRIN 81 MG PO CHEW
324.0000 mg | CHEWABLE_TABLET | ORAL | Status: AC
  Administered 2012-08-04: 324 mg via ORAL
  Filled 2012-08-04: qty 4

## 2012-08-04 MED ORDER — SODIUM CHLORIDE 0.9 % IV SOLN
INTRAVENOUS | Status: DC
  Administered 2012-08-04: 09:00:00 via INTRAVENOUS

## 2012-08-04 MED ORDER — VERAPAMIL HCL 2.5 MG/ML IV SOLN
INTRAVENOUS | Status: AC
  Filled 2012-08-04: qty 2

## 2012-08-04 MED ORDER — FENTANYL CITRATE 0.05 MG/ML IJ SOLN
INTRAMUSCULAR | Status: AC
  Filled 2012-08-04: qty 2

## 2012-08-04 MED ORDER — SODIUM CHLORIDE 0.9 % IV SOLN: 250.0000 mL | INTRAVENOUS | Status: DC | PRN

## 2012-08-04 MED ORDER — SITAGLIPTIN PHOS-METFORMIN HCL 50-500 MG PO TABS: 1.0000 | ORAL_TABLET | Freq: Two times a day (BID) | ORAL | Status: DC

## 2012-08-04 MED ORDER — ONDANSETRON HCL 4 MG/2ML IJ SOLN: 4.0000 mg | Freq: Four times a day (QID) | INTRAMUSCULAR | Status: DC | PRN

## 2012-08-04 MED ORDER — SODIUM CHLORIDE 0.9 % IJ SOLN: 3.0000 mL | Freq: Two times a day (BID) | INTRAMUSCULAR | Status: DC

## 2012-08-04 MED ORDER — ASPIRIN 81 MG PO CHEW: 81.0000 mg | CHEWABLE_TABLET | Freq: Every day | ORAL | Status: DC

## 2012-08-04 MED ORDER — HEPARIN SODIUM (PORCINE) 1000 UNIT/ML IJ SOLN
INTRAMUSCULAR | Status: AC
  Filled 2012-08-04: qty 1

## 2012-08-04 MED ORDER — LIDOCAINE HCL (PF) 1 % IJ SOLN
INTRAMUSCULAR | Status: AC
  Filled 2012-08-04: qty 30

## 2012-08-04 MED ORDER — FUROSEMIDE 10 MG/ML IJ SOLN
INTRAMUSCULAR | Status: AC
  Filled 2012-08-04: qty 4

## 2012-08-04 MED ORDER — FUROSEMIDE 10 MG/ML IJ SOLN: 40.0000 mg | Freq: Once | INTRAMUSCULAR | Status: DC

## 2012-08-04 MED ORDER — SODIUM CHLORIDE 0.9 % IV SOLN: INTRAVENOUS | Status: DC

## 2012-08-04 MED ORDER — SODIUM CHLORIDE 0.9 % IJ SOLN: 3.0000 mL | INTRAMUSCULAR | Status: DC | PRN

## 2012-08-04 MED ORDER — DIAZEPAM 5 MG PO TABS
5.0000 mg | ORAL_TABLET | ORAL | Status: DC
  Filled 2012-08-04: qty 1

## 2012-08-04 MED ORDER — HEPARIN (PORCINE) IN NACL 2-0.9 UNIT/ML-% IJ SOLN
INTRAMUSCULAR | Status: AC
  Filled 2012-08-04: qty 1000

## 2012-08-04 MED ORDER — MIDAZOLAM HCL 2 MG/2ML IJ SOLN
INTRAMUSCULAR | Status: AC
  Filled 2012-08-04: qty 2

## 2012-08-04 MED ORDER — ACETAMINOPHEN 325 MG PO TABS: 650.0000 mg | ORAL_TABLET | ORAL | Status: DC | PRN

## 2012-08-04 MED ORDER — FUROSEMIDE 40 MG PO TABS: 80.0000 mg | ORAL_TABLET | Freq: Every day | ORAL | Status: DC

## 2012-08-04 NOTE — H&P (Signed)
  Date of Initial H&P: 07/30/12  History reviewed, patient examined, no change in status, stable for surgery.

## 2012-08-04 NOTE — CV Procedure (Addendum)
PROCEDURE:  Left heart catheterization with selective coronary angiography, left ventriculogram.  Right heart cath.  INDICATIONS:  Cardiomyopathy  The risks, benefits, and details of the procedure were explained to the patient.  The patient verbalized understanding and wanted to proceed.  Informed written consent was obtained.  PROCEDURE TECHNIQUE:  After Xylocaine anesthesia a 27F sheath was placed in the right radial artery with a single anterior needle wall stick.  A 5 Fr sheath was exchanged for an IV in a right antecubital vein over a wire.   Left coronary angiography was done using a Judkins L3 guide catheter.  Right coronary angiography was done using a Judkins R4 guide catheter.  Left ventriculography was done using a pigtail catheter.  TR band used for hemostasis.  Manual compression used at the right brachial vein site.    CONTRAST:  Total of 85 cc.  COMPLICATIONS:  None.    HEMODYNAMICS:  Aortic pressure was 115/83; mean Ao pressure 97 mm Hg; LV pressure was 113/28; LVEDP 40.  There was no gradient between the left ventricle and aorta.  RA pressure: 27/19  Mean RA pressure 23 mm Hg;  RV pressure: 49/28, RVEDP 29 mm Hg; PA pressure: 53/31  PCWP: 34/31 mm Hg    Mean PCWP 31 mm Hg   Cardiac output  4.74 L/min  Cardiac index 2.12  ANGIOGRAPHIC DATA:   The left main coronary artery is a large vessel which is widely patent.  The left anterior descending artery is a large vessel which reaches the apex.  There are couple of small diagonal branches which appear patent.  The left circumflex artery is a large vessel which appears widely patent.  There is a large ramus vessel which has only minimal atherosclerosis.  There is a long first obtuse marginal which is widely patent.  There is a medium-sized second obtuse marginal..  The right coronary artery is widely patent.  It is a medium-size vessel.  LEFT VENTRICULOGRAM:  Left ventricular angiogram was done in the 30 RAO projection and revealed  normal left ventricular wall motion and systolic function with an estimated ejection fraction of 25 %.  LVEDP was 40 mmHg.  IMPRESSIONS:  1. Normal left main coronary artery. 2. Normal left anterior descending artery and its branches. 3. Normal left circumflex artery and its branches. 4. Normal right coronary artery. 5. Normal left ventricular systolic function.  LVEDP 40 mmHg.  Ejection fraction 25 %. 6.   Severe volume overload.  Pulmonary hypertension.  RECOMMENDATION:  Nonischemic cardiomyopathy.  Recommend aggressive medical therapy.  Will give IV Lasix now to help with diuresis.  This will likely help his symptoms.  He will need ACE inhibitor, beta blocker, spironolactone.

## 2013-02-03 ENCOUNTER — Other Ambulatory Visit: Payer: Self-pay

## 2013-04-21 ENCOUNTER — Ambulatory Visit (INDEPENDENT_AMBULATORY_CARE_PROVIDER_SITE_OTHER): Payer: Medicaid Other | Admitting: Interventional Cardiology

## 2013-04-21 ENCOUNTER — Encounter: Payer: Self-pay | Admitting: Interventional Cardiology

## 2013-04-21 VITALS — BP 147/91 | HR 72 | Ht 66.0 in | Wt 271.0 lb

## 2013-04-21 DIAGNOSIS — I10 Essential (primary) hypertension: Secondary | ICD-10-CM

## 2013-04-21 DIAGNOSIS — E785 Hyperlipidemia, unspecified: Secondary | ICD-10-CM

## 2013-04-21 DIAGNOSIS — I428 Other cardiomyopathies: Secondary | ICD-10-CM

## 2013-04-21 DIAGNOSIS — E669 Obesity, unspecified: Secondary | ICD-10-CM | POA: Insufficient documentation

## 2013-04-21 HISTORY — DX: Morbid (severe) obesity due to excess calories: E66.01

## 2013-04-21 MED ORDER — FUROSEMIDE 40 MG PO TABS: ORAL_TABLET | ORAL | Status: DC

## 2013-04-21 MED ORDER — LISINOPRIL 10 MG PO TABS: 10.0000 mg | ORAL_TABLET | Freq: Every day | ORAL | Status: DC

## 2013-04-21 NOTE — Progress Notes (Signed)
Patient ID: Kevin Long, male   DOB: November 04, 1960, 53 y.o.   MRN: [id number]    [address] [place], [place]  [address] Phone: [phone] Fax:  [phone]  Date:  04/21/2013   ID:  Laine Rasler, DOB 1960-10-29, MRN [id number]  PCP:  Dorrene German, MD      History of Present Illness: Giordan Gilbertson is a 53 y.o. male who has had a nonischemic cardiomyopathy. He had a cath in 2014. He was fluid overloaded. He has responded well to more lasix.  Breathing and DOE are much better with walking after diuresis. He has some occasional fatigue.  Some DOE with walking  up hills.  He has gained some weight.  Not following a strict diet.  He is taking Lasix every other day. It seems more effective to him that way.  No orthopnea or PND.     Wt Readings from Last 3 Encounters:  04/21/13 271 lb (122.925 kg)  08/04/12 260 lb (117.935 kg)  08/04/12 260 lb (117.935 kg)     Past Medical History  Diagnosis Date  . Diabetes mellitus, type 2   . Hypercholesterolemia   . Hypertension   . Asthma   . Obesity   . OSA (obstructive sleep apnea)   . Other primary cardiomyopathies     Current Outpatient Prescriptions  Medication Sig Dispense Refill  . albuterol (PROVENTIL HFA;VENTOLIN HFA) 108 (90 BASE) MCG/ACT inhaler Inhale 2 puffs into the lungs every 4 (four) hours as needed for wheezing or shortness of breath. For wheezing or shortness of breath      . allopurinol (ZYLOPRIM) 100 MG tablet Take 100 mg by mouth daily.       . carvedilol (COREG) 6.25 MG tablet Take 6.25 mg by mouth 2 (two) times daily with a meal.      . cholecalciferol (VITAMIN D) 1000 UNITS tablet Take 1,000 Units by mouth daily.      . diclofenac (VOLTAREN) 50 MG EC tablet Take 50 mg by mouth 2 (two) times daily.      Marland Kitchen ezetimibe-simvastatin (VYTORIN) 10-20 MG per tablet Take 1 tablet by mouth daily.      . fluticasone (FLONASE) 50 MCG/ACT nasal spray Place 2 sprays into the nose daily as needed for allergies. For  seasonal allergies      . furosemide (LASIX) 40 MG tablet Take 2 tablets (80 mg total) by mouth daily.  60 tablet  11  . Insulin Glargine (LANTUS SOLOSTAR) 100 UNIT/ML SOPN Inject 25 Units into the skin at bedtime.      Marland Kitchen lisinopril (PRINIVIL,ZESTRIL) 10 MG tablet Take 10 mg by mouth daily.      . sitaGLIPtan-metformin (JANUMET) 50-500 MG per tablet Take 1 tablet by mouth 2 (two) times daily with a meal.       No current facility-administered medications for this visit.    Allergies:   No Known Allergies  Social History:  The patient  reports that he quit smoking about 8 years ago. He does not have any smokeless tobacco history on file. He reports that he does not drink alcohol.   Family History:  The patient's family history includes Anxiety disorder in his brother; Depression in his son; Diabetes in his mother; Heart murmur in his son.   ROS:  Please see the history of present illness.  No nausea, vomiting.  No fevers, chills.  No focal weakness.  No dysuria.    All other systems reviewed and negative.  PHYSICAL EXAM: VS:  BP 147/91  Pulse 72  Ht 5\' 6"  (1.676 m)  Wt 271 lb (122.925 kg)  BMI 43.76 kg/m2 Well nourished, well developed, in no acute distress HEENT: normal Neck: no JVD, no carotid bruits Cardiac:  normal S1, S2; RRR;  Lungs:  clear to auscultation bilaterally, no wheezing, rhonchi or rales Abd: soft, nontender, no hepatomegaly Ext: no edema Skin: warm and dry Neuro:   no focal abnormalities noted     ASSESSMENT AND PLAN:  Chronic systolic heart failure  Continue Lasix Tablet, 40 MG, 1 tablet, Orally, twice a day prn; taking it every other day Continue  Carvedilol Tablet, 6.25 MG, 1 tablet with food, Orally, Twice a day, 30 day(s), 60, Refills 11 Continue Lisinopril Tablet, 10 MG, 1 tablet, Orally, Once a day Notes: Stopped the norvasc due to hypotension. WOuld rather have him on ACE-I due to systolic dysfunction. No signs of fluid overload at this time. Well  compensated.  EF 30-35% in 4/14. Recheck in 8/14 showed EF 25-30%. Walking limited by knee pain. Consider referral to EP for AICD. No significant sx . Will recheck echo. WIll titrate meds and follow LVEF.    2. Hypertension, essential  Notes: Controlled. Better at home.    3. Obesity: Gained 15 lbs since 9/14.  Stressed importance of diet control and exercise.  Eats a lot of [place] food.  I asked him to decrease this habit.    Signed, Fredric MareJay S. Fabiola Mudgett, MD, [facility] 04/21/2013 4:58 PM

## 2013-04-21 NOTE — Patient Instructions (Signed)
Your physician has requested that you have an echocardiogram. Echocardiography is a painless test that uses sound waves to create images of your heart. It provides your doctor with information about the size and shape of your heart and how well your heart's chambers and valves are working. This procedure takes approximately one hour. There are no restrictions for this procedure.  Your physician wants you to follow-up in: 3 MONTHS with Dr. Eldridge Dace. You will receive a reminder letter in the mail two months in advance. If you don't receive a letter, please call our office to schedule the follow-up appointment.

## 2013-04-25 ENCOUNTER — Ambulatory Visit (HOSPITAL_COMMUNITY): Payer: Medicaid Other | Attending: Cardiology | Admitting: Cardiology

## 2013-04-25 ENCOUNTER — Encounter: Payer: Self-pay | Admitting: Cardiology

## 2013-04-25 DIAGNOSIS — R5383 Other fatigue: Secondary | ICD-10-CM | POA: Insufficient documentation

## 2013-04-25 DIAGNOSIS — R0989 Other specified symptoms and signs involving the circulatory and respiratory systems: Secondary | ICD-10-CM

## 2013-04-25 DIAGNOSIS — R5381 Other malaise: Secondary | ICD-10-CM | POA: Insufficient documentation

## 2013-04-25 DIAGNOSIS — Z87891 Personal history of nicotine dependence: Secondary | ICD-10-CM | POA: Insufficient documentation

## 2013-04-25 DIAGNOSIS — I10 Essential (primary) hypertension: Secondary | ICD-10-CM | POA: Insufficient documentation

## 2013-04-25 DIAGNOSIS — E785 Hyperlipidemia, unspecified: Secondary | ICD-10-CM | POA: Insufficient documentation

## 2013-04-25 DIAGNOSIS — I428 Other cardiomyopathies: Secondary | ICD-10-CM

## 2013-04-25 DIAGNOSIS — E119 Type 2 diabetes mellitus without complications: Secondary | ICD-10-CM | POA: Insufficient documentation

## 2013-04-25 DIAGNOSIS — R0609 Other forms of dyspnea: Secondary | ICD-10-CM | POA: Insufficient documentation

## 2013-04-25 DIAGNOSIS — I059 Rheumatic mitral valve disease, unspecified: Secondary | ICD-10-CM | POA: Insufficient documentation

## 2013-04-25 NOTE — Progress Notes (Signed)
Echo performed. 

## 2013-05-03 NOTE — Progress Notes (Signed)
lmtrc

## 2013-06-11 ENCOUNTER — Encounter: Payer: Self-pay | Admitting: *Deleted

## 2013-08-04 ENCOUNTER — Ambulatory Visit: Payer: Medicaid Other | Admitting: Interventional Cardiology

## 2013-08-04 ENCOUNTER — Ambulatory Visit (INDEPENDENT_AMBULATORY_CARE_PROVIDER_SITE_OTHER): Payer: Medicaid Other | Admitting: Interventional Cardiology

## 2013-08-04 ENCOUNTER — Encounter: Payer: Self-pay | Admitting: Interventional Cardiology

## 2013-08-04 VITALS — BP 138/80 | HR 70 | Ht 66.0 in | Wt 262.1 lb

## 2013-08-04 DIAGNOSIS — I10 Essential (primary) hypertension: Secondary | ICD-10-CM

## 2013-08-04 DIAGNOSIS — E669 Obesity, unspecified: Secondary | ICD-10-CM

## 2013-08-04 DIAGNOSIS — I428 Other cardiomyopathies: Secondary | ICD-10-CM

## 2013-08-04 NOTE — Patient Instructions (Signed)
Your physician recommends that you continue on your current medications as directed. Please refer to the Current Medication list given to you today.  Your physician wants you to follow-up in: 6 months with Dr. Varanasi.  You will receive a reminder letter in the mail two months in advance. If you don't receive a letter, please call our office to schedule the follow-up appointment.  

## 2013-08-04 NOTE — Progress Notes (Signed)
Patient ID: Kevin Long, male   DOB: [birth year], 53 y.o.   MRN: [id number] Patient ID: Kevin Long, male   DOB: [birth year], 53 y.o.   MRN: [id number]    [address] [place], [place]  [id number] Phone: [phone] Fax:  [phone]  Date:  08/04/2013   ID:  Kevin Long, DOB [birth year], MRN [id number]  PCP:  Dorrene GermanAVBUERE,EDWIN A, MD      History of Present Illness: Kevin Long is a 53 y.o. male who has had a nonischemic cardiomyopathy. He had a cath in 2014. He was fluid overloaded. He has responded well to more lasix.  Breathing and DOE are much better with walking after diuresis. He has some occasional fatigue.  Some DOE with walking  up hills.  He has gained some weight.  Not following a strict diet.  He is taking Lasix every other day. It seems more effective to him that way.  No orthopnea or PND.    He had some transient right hand weakness for three days.  He self medicated with Alprazolam.  Arm is back to normal.  He is trying to watch his diet to lose weight.   Wt Readings from Last 3 Encounters:  08/04/13 262 lb 1.9 oz (118.897 kg)  04/21/13 271 lb (122.925 kg)  08/04/12 260 lb (117.935 kg)     Past Medical History  Diagnosis Date  . Diabetes mellitus, type 2   . Hypercholesterolemia   . Hypertension   . Asthma   . Obesity   . OSA (obstructive sleep apnea)   . Other primary cardiomyopathies     Current Outpatient Prescriptions  Medication Sig Dispense Refill  . albuterol (PROVENTIL HFA;VENTOLIN HFA) 108 (90 BASE) MCG/ACT inhaler Inhale 2 puffs into the lungs every 4 (four) hours as needed for wheezing or shortness of breath. For wheezing or shortness of breath      . allopurinol (ZYLOPRIM) 100 MG tablet Take 100 mg by mouth daily.       . carvedilol (COREG) 6.25 MG tablet Take 6.25 mg by mouth 2 (two) times daily with a meal.      . cholecalciferol (VITAMIN D) 1000 UNITS tablet Take 1,000 Units by mouth daily.      . diclofenac (VOLTAREN) 50 MG EC  tablet Take 50 mg by mouth 2 (two) times daily.      Marland Kitchen. escitalopram (LEXAPRO) 10 MG tablet Take 10 mg by mouth daily.      Marland Kitchen. ezetimibe-simvastatin (VYTORIN) 10-20 MG per tablet Take 1 tablet by mouth daily.      . fluticasone (FLONASE) 50 MCG/ACT nasal spray Place 2 sprays into the nose daily as needed for allergies. For seasonal allergies      . furosemide (LASIX) 40 MG tablet 1 TABLET EVERY OTHER DAY  60 tablet  11  . Insulin Glargine (LANTUS SOLOSTAR) 100 UNIT/ML SOPN Inject 25 Units into the skin at bedtime.      Marland Kitchen. lisinopril (PRINIVIL,ZESTRIL) 10 MG tablet Take 1 tablet (10 mg total) by mouth daily.  90 tablet  3  . sitaGLIPtan-metformin (JANUMET) 50-500 MG per tablet Take 1 tablet by mouth 2 (two) times daily with a meal.       No current facility-administered medications for this visit.    Allergies:   No Known Allergies  Social History:  The patient  reports that he quit smoking about 8 years ago. He does not have any smokeless tobacco history on file. He reports that he does  not drink alcohol.   Family History:  The patient's family history includes Anxiety disorder in his brother; Depression in his son; Diabetes in his mother; Heart murmur in his son.   ROS:  Please see the history of present illness.  No nausea, vomiting.  No fevers, chills.  No focal weakness.  No dysuria.    All other systems reviewed and negative.   PHYSICAL EXAM: VS:  BP 138/80  Pulse 70  Ht 5\' 6"  (1.676 m)  Wt 262 lb 1.9 oz (118.897 kg)  BMI 42.33 kg/m2 Well nourished, well developed, in no acute distress HEENT: normal Neck: no JVD, no carotid bruits Cardiac:  normal S1, S2; RRR;  Lungs:  clear to auscultation bilaterally, no wheezing, rhonchi or rales Abd: soft, nontender, no hepatomegaly Ext: no edema Skin: warm and dry Neuro:   no focal abnormalities noted     ASSESSMENT AND PLAN:  Chronic systolic heart failure  Continue Lasix Tablet, 40 MG, 1 tablet, Orally, twice a day prn; taking it  every other day Continue  Carvedilol Tablet, 6.25 MG, 1 tablet with food, Orally, Twice a day, 30 day(s), 60, Refills 11 Continue Lisinopril Tablet, 10 MG, 1 tablet, Orally, Once a day Notes: Stopped the norvasc due to hypotension. WOuld rather have him on ACE-I due to systolic dysfunction. No signs of fluid overload at this time. Well compensated.  EF 30-35% in 4/14. Recheck in 8/14 showed EF 25-30%. Walking limited by knee pain. No need for  EP referral for AICD. No significant sx . 1/15 recheck of echo showed EF 40-45%.Marland Kitchen WIll titrate meds and follow LVEF.    2. Hypertension, essential  Notes: Controlled. Better at home.  Continue to check at home.    3. Obesity: Lost 10 lbs since 1/15.  Stressed importance of diet control and exercise.  Eats a lot of [place] food.  I asked him to decrease this habit to avoid salt.  Signed, Fredric Mare, MD, [facility] 08/04/2013 12:18 PM

## 2013-08-11 ENCOUNTER — Ambulatory Visit: Payer: Medicaid Other | Admitting: Interventional Cardiology

## 2013-09-13 ENCOUNTER — Other Ambulatory Visit: Payer: Self-pay

## 2013-09-13 MED ORDER — FUROSEMIDE 40 MG PO TABS: ORAL_TABLET | ORAL | Status: DC

## 2013-10-23 ENCOUNTER — Other Ambulatory Visit: Payer: Self-pay

## 2013-10-23 MED ORDER — FUROSEMIDE 40 MG PO TABS: ORAL_TABLET | ORAL | Status: DC

## 2013-10-24 ENCOUNTER — Ambulatory Visit (HOSPITAL_COMMUNITY)
Admission: RE | Admit: 2013-10-24 | Discharge: 2013-10-24 | Disposition: A | Discharge status: Home / Self Care | Payer: Medicaid Other | Source: Ambulatory Visit | Attending: Internal Medicine | Admitting: Internal Medicine

## 2013-10-24 ENCOUNTER — Other Ambulatory Visit (HOSPITAL_COMMUNITY): Payer: Self-pay | Admitting: Internal Medicine

## 2013-10-24 DIAGNOSIS — J209 Acute bronchitis, unspecified: Secondary | ICD-10-CM | POA: Diagnosis not present

## 2013-10-24 DIAGNOSIS — J208 Acute bronchitis due to other specified organisms: Secondary | ICD-10-CM

## 2014-01-13 ENCOUNTER — Other Ambulatory Visit: Payer: Self-pay

## 2014-03-09 ENCOUNTER — Encounter (HOSPITAL_COMMUNITY): Payer: Self-pay | Admitting: Interventional Cardiology

## 2014-03-13 ENCOUNTER — Other Ambulatory Visit: Payer: Self-pay | Admitting: Interventional Cardiology

## 2014-06-25 ENCOUNTER — Other Ambulatory Visit: Payer: Self-pay | Admitting: Interventional Cardiology

## 2014-09-25 ENCOUNTER — Other Ambulatory Visit: Payer: Self-pay

## 2015-04-24 ENCOUNTER — Ambulatory Visit: Payer: Medicaid Other | Admitting: Podiatry

## 2015-07-25 ENCOUNTER — Telehealth: Payer: Self-pay | Admitting: Interventional Cardiology

## 2015-07-25 NOTE — Telephone Encounter (Signed)
LMTCB

## 2015-07-25 NOTE — Telephone Encounter (Signed)
Pt c/o Shortness Of Breath: STAT if SOB developed within the last 24 hours or pt is noticeably SOB on the phone  1. Are you currently SOB (can you hear that pt is SOB on the phone)? no 2. How long have you been experiencing SOB? 6 MONTHS 3. Are you SOB when sitting or when up moving around? both  4. Are you currently experiencing any other symptoms? Chest pain/ coming and going     Pt c/o of Chest Pain: STAT if CP now or developed within 24 hours  1. Are you having CP right now? no 2. Are you experiencing any other symptoms (ex. SOB, nausea, vomiting, sweating)? SOB  3. How long have you been experiencing CP? 6 months   4. Is your CP continuous or coming and going? Coming and going  5. Have you taken Nitroglycerin? no ?

## 2015-07-27 NOTE — Telephone Encounter (Signed)
The pt states that he continues to be SOB and has CP on and off but not at this time. He is requesting an apt., he is almost 2 years overdue for f/u. The pt has been scheduled to see Herma Carson, PA-c on 5/15 at 11:45 and he is advised to call 911 or have someone drive him to the closest ER if his s/s persist or worsen. He verbalized understanding.

## 2015-07-30 ENCOUNTER — Ambulatory Visit: Payer: Medicaid Other | Admitting: Physician Assistant

## 2015-08-13 ENCOUNTER — Encounter: Payer: Self-pay | Admitting: Physician Assistant

## 2015-08-13 ENCOUNTER — Ambulatory Visit (INDEPENDENT_AMBULATORY_CARE_PROVIDER_SITE_OTHER): Payer: Medicaid Other | Admitting: Physician Assistant

## 2015-08-13 ENCOUNTER — Ambulatory Visit: Payer: Medicaid Other | Admitting: Physician Assistant

## 2015-08-13 VITALS — BP 118/68 | HR 82 | Ht 66.0 in | Wt 267.8 lb

## 2015-08-13 DIAGNOSIS — R079 Chest pain, unspecified: Secondary | ICD-10-CM | POA: Diagnosis not present

## 2015-08-13 DIAGNOSIS — E669 Obesity, unspecified: Secondary | ICD-10-CM

## 2015-08-13 DIAGNOSIS — I429 Cardiomyopathy, unspecified: Secondary | ICD-10-CM

## 2015-08-13 DIAGNOSIS — I428 Other cardiomyopathies: Secondary | ICD-10-CM

## 2015-08-13 DIAGNOSIS — I10 Essential (primary) hypertension: Secondary | ICD-10-CM | POA: Diagnosis not present

## 2015-08-13 DIAGNOSIS — R002 Palpitations: Secondary | ICD-10-CM | POA: Diagnosis not present

## 2015-08-13 HISTORY — DX: Palpitations: R00.2

## 2015-08-13 HISTORY — DX: Other cardiomyopathies: I42.8

## 2015-08-13 LAB — CBC
HEMATOCRIT: 40.4 % (ref 38.5–50.0)
Hemoglobin: 13.3 g/dL (ref 13.2–17.1)
MCH: 26.5 pg — AB (ref 27.0–33.0)
MCHC: 32.9 g/dL (ref 32.0–36.0)
MCV: 80.5 fL (ref 80.0–100.0)
MPV: 10.9 fL (ref 7.5–12.5)
Platelets: 430 10*3/uL — ABNORMAL HIGH (ref 140–400)
RBC: 5.02 MIL/uL (ref 4.20–5.80)
RDW: 15.4 % — AB (ref 11.0–15.0)
WBC: 8 10*3/uL (ref 3.8–10.8)

## 2015-08-13 LAB — COMPREHENSIVE METABOLIC PANEL
ALBUMIN: 4.3 g/dL (ref 3.6–5.1)
ALT: 16 U/L (ref 9–46)
AST: 17 U/L (ref 10–35)
Alkaline Phosphatase: 69 U/L (ref 40–115)
BILIRUBIN TOTAL: 0.6 mg/dL (ref 0.2–1.2)
BUN: 8 mg/dL (ref 7–25)
CALCIUM: 9.8 mg/dL (ref 8.6–10.3)
CHLORIDE: 102 mmol/L (ref 98–110)
CO2: 23 mmol/L (ref 20–31)
Creat: 1.19 mg/dL (ref 0.70–1.33)
Glucose, Bld: 258 mg/dL — ABNORMAL HIGH (ref 65–99)
POTASSIUM: 4.6 mmol/L (ref 3.5–5.3)
Sodium: 138 mmol/L (ref 135–146)
Total Protein: 7 g/dL (ref 6.1–8.1)

## 2015-08-13 LAB — LIPID PANEL
CHOL/HDL RATIO: 4.5 ratio (ref <=5.0)
Cholesterol: 246 mg/dL — ABNORMAL HIGH (ref 125–200)
HDL: 55 mg/dL (ref >=40)
LDL CALC: 167 mg/dL — AB (ref <130)
TRIGLYCERIDES: 122 mg/dL (ref <150)
VLDL: 24 mg/dL (ref <30)

## 2015-08-13 LAB — TSH: TSH: 2.17 mIU/L (ref 0.40–4.50)

## 2015-08-13 MED ORDER — CARVEDILOL 12.5 MG PO TABS: 12.5000 mg | ORAL_TABLET | Freq: Two times a day (BID) | ORAL | Status: DC

## 2015-08-13 NOTE — Patient Instructions (Signed)
Medication Instructions:  Your physician has recommended you make the following change in your medication:  1.  INCREASE the Coreg to 12.5 mg taking 1 tablet twice a day   Labwork: TODAY;  CBC, CMET, TSH, & LIPID  Testing/Procedures: Your physician has requested that you have an echocardiogram. Echocardiography is a painless test that uses sound waves to create images of your heart. It provides your doctor with information about the size and shape of your heart and how well your heart's chambers and valves are working. This procedure takes approximately one hour. There are no restrictions for this procedure.  Your physician has recommended that you wear an event monitor. Event monitors are medical devices that record the heart's electrical activity. Doctors most often [place] these monitors to diagnose arrhythmias. Arrhythmias are problems with the speed or rhythm of the heartbeat. The monitor is a small, portable device. You can wear one while you do your normal daily activities. This is usually used to diagnose what is causing palpitations/syncope (passing out).   Follow-Up: Your physician recommends that you schedule a follow-up appointment in: 1 MONTH WITH DR. VARANASI  Any Other Special Instructions Will Be Listed Below (If Applicable). Echocardiogram An echocardiogram, or echocardiography, uses sound waves (ultrasound) to produce an image of your heart. The echocardiogram is simple, painless, obtained within a short period of time, and offers valuable information to your health care provider. The images from an echocardiogram can provide information such as:  Evidence of coronary artery disease (CAD).  Heart size.  Heart muscle function.  Heart valve function.  Aneurysm detection.  Evidence of a past heart attack.  Fluid buildup around the heart.  Heart muscle thickening.  Assess heart valve function. LET [facility] CARE PROVIDER KNOW ABOUT:  Any allergies you have.  All  medicines you are taking, including vitamins, herbs, eye drops, creams, and over-the-counter medicines.  Previous problems you or members of your family have had with the use of anesthetics.  Any blood disorders you have.  Previous surgeries you have had.  Medical conditions you have.  Possibility of pregnancy, if this applies. BEFORE THE PROCEDURE  No special preparation is needed. Eat and drink normally.  PROCEDURE   In order to produce an image of your heart, gel will be applied to your chest and a wand-like tool (transducer) will be moved over your chest. The gel will help transmit the sound waves from the transducer. The sound waves will harmlessly bounce off your heart to allow the heart images to be captured in real-time motion. These images will then be recorded.  You may need an IV to receive a medicine that improves the quality of the pictures. AFTER THE PROCEDURE You may return to your normal schedule including diet, activities, and medicines, unless your health care provider tells you otherwise.   This information is not intended to replace advice given to you by your health care provider. Make sure you discuss any questions you have with your health care provider.   Document Released: 03/14/2000 Document Revised: 04/07/2014 Document Reviewed: 11/22/2012 Elsevier Interactive Patient Education 2016 Else[employer] Inc.  Cardiac Event Monitoring A cardiac event monitor is a small recording device used to help detect abnormal heart rhythms (arrhythmias). The monitor is used to record heart rhythm when noticeable symptoms such as the following occur:  Fast heartbeats (palpitations), such as heart racing or fluttering.  Dizziness.  Fainting or light-headedness.  Unexplained weakness. The monitor is wired to two electrodes placed on your chest. Electrodes  are flat, sticky disks that attach to your skin. The monitor can be worn for up to 30 days. You will wear the monitor at all  times, except when bathing.  HOW TO USE YOUR CARDIAC EVENT MONITOR A technician will prepare your chest for the electrode placement. The technician will show you how to place the electrodes, how to work the monitor, and how to replace the batteries. Take time to practice using the monitor before you leave the office. Make sure you understand how to send the information from the monitor to your health care provider. This requires a telephone with a landline, not a cell phone. You need to:  Wear your monitor at all times, except when you are in water:  Do not get the monitor wet.  Take the monitor off when bathing. Do not swim or use a hot tub with it on.  Keep your skin clean. Do not put body lotion or moisturizer on your chest.  Change the electrodes daily or any time they stop sticking to your skin. You might need to use tape to keep them on.  It is possible that your skin under the electrodes could become irritated. To keep this from happening, try to put the electrodes in slightly different places on your chest. However, they must remain in the area under your left breast and in the upper right section of your chest.  Make sure the monitor is safely clipped to your clothing or in a location close to your body that your health care provider recommends.  Press the button to record when you feel symptoms of heart trouble, such as dizziness, weakness, light-headedness, palpitations, thumping, shortness of breath, unexplained weakness, or a fluttering or racing heart. The monitor is always on and records what happened slightly before you pressed the button, so do not worry about being too late to get good information.  Keep a diary of your activities, such as walking, doing chores, and taking medicine. It is especially important to note what you were doing when you pushed the button to record your symptoms. This will help your health care provider determine what might be contributing to your  symptoms. The information stored in your monitor will be reviewed by your health care provider alongside your diary entries.  Send the recorded information as recommended by your health care provider. It is important to understand that it will take some time for your health care provider to process the results.  Change the batteries as recommended by your health care provider. SEEK IMMEDIATE MEDICAL CARE IF:   You have chest pain.  You have extreme difficulty breathing or shortness of breath.  You develop a very fast heartbeat that persists.  You develop dizziness that does not go away.  You faint or constantly feel you are about to faint.   This information is not intended to replace advice given to you by your health care provider. Make sure you discuss any questions you have with your health care provider.   Document Released: 12/25/2007 Document Revised: 04/07/2014 Document Reviewed: 09/13/2012 Elsevier Interactive Patient Education [employer]    If you need a refill on your cardiac medications before your next appointment, please call your pharmacy.

## 2015-08-13 NOTE — Progress Notes (Signed)
Cardiology Office Note    Date:  08/13/2015   ID:  Kevin Long, DOB [birth year], MRN [id number]  PCP:  Dorrene German, MD  Cardiologist:  Dr. Eldridge Dace  Chief Complaint  Patient presents with  . Chest Pain    History of Present Illness:  Kevin Long is a 55 y.o. male with a nonischemic cardiomyopathy. Patient had cardiac catheterization 2014 show normal coronary arteries EF 25%. Patient had severe volume overload and pulmonary hypertension. Last 2-D echo 04/25/2013 EF 40-45% with diffuse hypokinesis. There was akinesis of the inferior myocardium. Grade 2 DD mild MR a patent foramen ovale cannot be excluded. She also has history of hypertension and obesity.  Patient comes in today after a 2 year absence from our office.Complains of heart racing associated with chest pain and dyspnea on exertion with activity. He has to stop and then continue on. He notices it when moving lawn, climbing stairs, walking too far-200 yards. This has been going on for about a year. Wants to lose weight so he can have knee operation, but can't exercise. Hasn't taken lisinopril for over 1 1/2 yrs.      Past Medical History  Diagnosis Date  . Diabetes mellitus, type 2 (HCC)   . Hypercholesterolemia   . Hypertension   . Asthma   . Obesity   . OSA (obstructive sleep apnea)   . Other primary cardiomyopathies     Past Surgical History  Procedure Laterality Date  . Foot surgery      has pin and screws  . Left and right heart catheterization with coronary angiogram N/A 08/04/2012    Procedure: LEFT AND RIGHT HEART CATHETERIZATION WITH CORONARY ANGIOGRAM;  Surgeon: Corky Crafts, MD;  Location: [facility] CATH LAB;  Service: Cardiovascular;  Laterality: N/A;    Current Medications: Outpatient Prescriptions Prior to Visit  Medication Sig Dispense Refill  . albuterol (PROVENTIL HFA;VENTOLIN HFA) 108 (90 BASE) MCG/ACT inhaler Inhale 2 puffs into the lungs every 4 (four) hours as needed for wheezing or  shortness of breath. For wheezing or shortness of breath    . fluticasone (FLONASE) 50 MCG/ACT nasal spray Place 2 sprays into the nose daily as needed for allergies. For seasonal allergies    . furosemide (LASIX) 40 MG tablet 1 TABLET EVERY OTHER DAY 30 tablet 11  . Insulin Glargine (LANTUS SOLOSTAR) 100 UNIT/ML SOPN Inject 25 Units into the skin at bedtime.    . sitaGLIPtan-metformin (JANUMET) 50-500 MG per tablet Take 1 tablet by mouth 2 (two) times daily with a meal.    . allopurinol (ZYLOPRIM) 100 MG tablet Take 100 mg by mouth daily.     . carvedilol (COREG) 6.25 MG tablet Take 6.25 mg by mouth 2 (two) times daily with a meal.    . cholecalciferol (VITAMIN D) 1000 UNITS tablet Take 1,000 Units by mouth daily.    . diclofenac (VOLTAREN) 50 MG EC tablet Take 50 mg by mouth 2 (two) times daily.    Marland Kitchen escitalopram (LEXAPRO) 10 MG tablet Take 10 mg by mouth daily.    Marland Kitchen ezetimibe-simvastatin (VYTORIN) 10-20 MG per tablet Take 1 tablet by mouth daily.    Marland Kitchen lisinopril (PRINIVIL,ZESTRIL) 10 MG tablet TAKE ONE TABLET BY MOUTH ONCE DAILY 90 tablet 3   No facility-administered medications prior to visit.     Allergies:   Review of patient's allergies indicates no known allergies.   Social History   Social History  . Marital Status: Single    Spouse Name: N/A  .  Number of Children: N/A  . Years of Education: N/A   Social History Main Topics  . Smoking status: Former Smoker    Quit date: 03/31/2005  . Smokeless tobacco: None  . Alcohol Use: No  . Drug Use: None  . Sexual Activity: Not Asked   Other Topics Concern  . None   Social History Narrative     Family History:  The patient's    family history includes Anxiety disorder in his brother; Depression in his son; Diabetes in his mother; Heart murmur in his son.   ROS:   Please see the history of present illness.    Review of Systems  Constitution: Negative.  HENT: Negative.   Cardiovascular: Positive for chest pain and dyspnea  on exertion.  Respiratory: Positive for sleep disturbances due to breathing.   Endocrine: Negative.   Hematologic/Lymphatic: Negative.   Musculoskeletal: Positive for back pain and myalgias.  Gastrointestinal: Negative.   Genitourinary: Negative.   Neurological: Negative.   Psychiatric/Behavioral: The patient is nervous/anxious.    All other systems reviewed and are negative.   PHYSICAL EXAM:   VS:  BP 118/68 mmHg  Pulse 82  Ht 5\' 6"  (1.676 m)  Wt 267 lb 12.8 oz (121.473 kg)  BMI 43.24 kg/m2   BHA:LPFXT, in no acute distress Neck: no JVD, carotid bruits, or masses Cardiac: RRR; no murmurs, rubs, or gallops,no edema  Respiratory:  clear to auscultation bilaterally, normal work of breathing GI: soft, nontender, nondistended, + BS MS: no deformity or atrophy Skin: warm and dry, no rash Neuro:  Alert and Oriented x 3, Strength and sensation are intact Psych: euthymic mood, full affect  Wt Readings from Last 3 Encounters:  08/13/15 267 lb 12.8 oz (121.473 kg)  08/04/13 262 lb 1.9 oz (118.897 kg)  04/21/13 271 lb (122.925 kg)      Studies/Labs Reviewed:   EKG:  EKG is ordered today.  The ekg ordered today demonstrates Normal sinus rhythm with PVC nonspecific ST-T wave changes  Recent Labs: No results found for requested labs within last 365 days.   Lipid Panel No results found for: CHOL, TRIG, HDL, CHOLHDL, VLDL, LDLCALC, LDLDIRECT  Additional studies/ records that were reviewed today include:  2-D echo January 2015 Study Conclusions  - Left ventricle: The cavity size was normal. Wall thickness   was normal. Systolic function was mildly to moderately   reduced. The estimated ejection fraction was in the range   of 40% to 45%. Diffuse hypokinesis. There is akinesis of   the inferior myocardium. Features are consistent with a   pseudonormal left ventricular filling pattern, with   concomitant abnormal relaxation and increased filling   pressure (grade 2 diastolic  dysfunction). - Aortic valve: Valve area: 2.61cm^2 (Vmax). - Mitral valve: Mild regurgitation. - Atrial septum: A patent foramen ovale cannot be excluded. Transthoracic echocardiography.  M-mode, complete 2D, spectral Doppler, and color Doppler.  Height:  Height: 167.6cm. Height: 66in.  Weight:  Weight: 122.9kg. Weight: 270.4lb.  Body mass index:  BMI: 43.7kg/m^2.  Body surface area:    BSA: 2.62m^2.  Blood pressure:     146/92.  Patient status:  Outpatient.  Cardiac catheterization 2014 LEFT VENTRICULOGRAM:  Left ventricular angiogram was done in the 30 RAO projection and revealed normal left ventricular wall motion and systolic function with an estimated ejection fraction of 25 %.  LVEDP was 40 mmHg.  IMPRESSIONS:     Normal left main coronary artery.  Normal left anterior descending artery and its  branches.  Normal left circumflex artery and its branches.  Normal right coronary artery.  Normal left ventricular systolic function.  LVEDP 40 mmHg.  Ejection fraction 25 %. 6.   Severe volume overload.  Pulmonary hypertension.  RECOMMENDATION:  Nonischemic cardiomyopathy.  Recommend aggressive medical therapy.  Will give IV Lasix now to help with diuresis.  This will likely help his symptoms.  He will need ACE inhibitor, beta blocker, spironolactone    ASSESSMENT:    1. Chest pain, unspecified chest pain type   2. Nonischemic cardiomyopathy (HCC)   3. Palpitations   4. Essential hypertension   5. Obesity, unspecified      PLAN:  In order of problems listed above:  Chest pain :Patient comes in complaining of over a year history of racing heart associated with chest pain and dyspnea with little activity. He has history of normal coronary arteries on cardiac catheterization 2014 EF of 25% with follow-up echo showing EF of 40-45%. Will increase Coreg to 12.5 mg twice a day. Place 30 day monitor and arrhythmia or tachycardia. We'll also check labs including fasting lipid panel,  TSH, CMET,CBC  Nonischemic cardiomyopathy follow-up 2-D echo to reevaluate EF.  Hypertension controlled  Obesity weight loss is imperative. Recommend weight loss program such as Weight Watchers. Patient needs to have knee surgery but was told he has to lose weight first.    Medication Adjustments/Labs and Tests Ordered: Current medicines are reviewed at length with the patient today.  Concerns regarding medicines are outlined above.  Medication changes, Labs and Tests ordered today are listed in the Patient Instructions below. Patient Instructions  Medication Instructions:  Your physician has recommended you make the following change in your medication:  1.  INCREASE the Coreg to 12.5 mg taking 1 tablet twice a day   Labwork: TODAY;  CBC, CMET, TSH, & LIPID  Testing/Procedures: Your physician has requested that you have an echocardiogram. Echocardiography is a painless test that uses sound waves to create images of your heart. It provides your doctor with information about the size and shape of your heart and how well your heart's chambers and valves are working. This procedure takes approximately one hour. There are no restrictions for this procedure.  Your physician has recommended that you wear an event monitor. Event monitors are medical devices that record the heart's electrical activity. Doctors most often [place] these monitors to diagnose arrhythmias. Arrhythmias are problems with the speed or rhythm of the heartbeat. The monitor is a small, portable device. You can wear one while you do your normal daily activities. This is usually used to diagnose what is causing palpitations/syncope (passing out).   Follow-Up: Your physician recommends that you schedule a follow-up appointment in: 1 MONTH WITH DR. VARANASI  Any Other Special Instructions Will Be Listed Below (If Applicable). Echocardiogram An echocardiogram, or echocardiography, uses sound waves (ultrasound) to produce an image  of your heart. The echocardiogram is simple, painless, obtained within a short period of time, and offers valuable information to your health care provider. The images from an echocardiogram can provide information such as:  Evidence of coronary artery disease (CAD).  Heart size.  Heart muscle function.  Heart valve function.  Aneurysm detection.  Evidence of a past heart attack.  Fluid buildup around the heart.  Heart muscle thickening.  Assess heart valve function. LET [facility] CARE PROVIDER KNOW ABOUT:  Any allergies you have.  All medicines you are taking, including vitamins, herbs, eye drops, creams, and over-the-counter medicines.  Previous problems you or members of your family have had with the use of anesthetics.  Any blood disorders you have.  Previous surgeries you have had.  Medical conditions you have.  Possibility of pregnancy, if this applies. BEFORE THE PROCEDURE  No special preparation is needed. Eat and drink normally.  PROCEDURE   In order to produce an image of your heart, gel will be applied to your chest and a wand-like tool (transducer) will be moved over your chest. The gel will help transmit the sound waves from the transducer. The sound waves will harmlessly bounce off your heart to allow the heart images to be captured in real-time motion. These images will then be recorded.  You may need an IV to receive a medicine that improves the quality of the pictures. AFTER THE PROCEDURE You may return to your normal schedule including diet, activities, and medicines, unless your health care provider tells you otherwise.   This information is not intended to replace advice given to you by your health care provider. Make sure you discuss any questions you have with your health care provider.   Document Released: 03/14/2000 Document Revised: 04/07/2014 Document Reviewed: 11/22/2012 Elsevier Interactive Patient Education 2016 [employer] Inc[employer]  Cardiac  Event Monitoring A cardiac event monitor is a small recording device used to help detect abnormal heart rhythms (arrhythmias). The monitor is used to record heart rhythm when noticeable symptoms such as the following occur:  Fast heartbeats (palpitations), such as heart racing or fluttering.  Dizziness.  Fainting or light-headedness.  Unexplained weakness. The monitor is wired to two electrodes placed on your chest. Electrodes are flat, sticky disks that attach to your skin. The monitor can be worn for up to 30 days. You will wear the monitor at all times, except when bathing.  HOW TO USE YOUR CARDIAC EVENT MONITOR A technician will prepare your chest for the electrode placement. The technician will show you how to place the electrodes, how to work the monitor, and how to replace the batteries. Take time to practice using the monitor before you leave the office. Make sure you understand how to send the information from the monitor to your health care provider. This requires a telephone with a landline, not a cell phone. You need to:  Wear your monitor at all times, except when you are in water:  Do not get the monitor wet.  Take the monitor off when bathing. Do not swim or use a hot tub with it on.  Keep your skin clean. Do not put body lotion or moisturizer on your chest.  Change the electrodes daily or any time they stop sticking to your skin. You might need to use tape to keep them on.  It is possible that your skin under the electrodes could become irritated. To keep this from happening, try to put the electrodes in slightly different places on your chest. However, they must remain in the area under your left breast and in the upper right section of your chest.  Make sure the monitor is safely clipped to your clothing or in a location close to your body that your health care provider recommends.  Press the button to record when you feel symptoms of heart trouble, such as dizziness,  weakness, light-headedness, palpitations, thumping, shortness of breath, unexplained weakness, or a fluttering or racing heart. The monitor is always on and records what happened slightly before you pressed the button, so do not worry about being too late to get good  information.  Keep a diary of your activities, such as walking, doing chores, and taking medicine. It is especially important to note what you were doing when you pushed the button to record your symptoms. This will help your health care provider determine what might be contributing to your symptoms. The information stored in your monitor will be reviewed by your health care provider alongside your diary entries.  Send the recorded information as recommended by your health care provider. It is important to understand that it will take some time for your health care provider to process the results.  Change the batteries as recommended by your health care provider. SEEK IMMEDIATE MEDICAL CARE IF:   You have chest pain.  You have extreme difficulty breathing or shortness of breath.  You develop a very fast heartbeat that persists.  You develop dizziness that does not go away.  You faint or constantly feel you are about to faint.   This information is not intended to replace advice given to you by your health care provider. Make sure you discuss any questions you have with your health care provider.   Document Released: 12/25/2007 Document Revised: 04/07/2014 Document Reviewed: 09/13/2012 Elsevier Interactive Patient Education [employer]    If you need a refill on your cardiac medications before your next appointment, please call your pharmacy.       Elson Clan, PA-C  08/13/2015 12:13 PM    [facility] [address] [place], [place], [place]  [address] Phone: [phone]; Fax: [phone]

## 2015-08-15 ENCOUNTER — Encounter: Payer: Self-pay | Admitting: Physician Assistant

## 2015-08-15 ENCOUNTER — Telehealth: Payer: Self-pay | Admitting: *Deleted

## 2015-08-15 DIAGNOSIS — I10 Essential (primary) hypertension: Secondary | ICD-10-CM

## 2015-08-15 DIAGNOSIS — E785 Hyperlipidemia, unspecified: Secondary | ICD-10-CM

## 2015-08-15 MED ORDER — ATORVASTATIN CALCIUM 20 MG PO TABS: 20.0000 mg | ORAL_TABLET | Freq: Every day | ORAL | Status: DC

## 2015-08-15 NOTE — Telephone Encounter (Signed)
-----   Message from Dyann Kief, PA-C sent at 08/15/2015  9:33 AM EDT ----- Cholesterol and LDL very high. Start lipitor 20 mg daily. Repeat lipids and Liver funtion in 6 weeks. Glucose also high. Needs tighter diabetes control. Weight loss would help both of these.

## 2015-08-23 ENCOUNTER — Other Ambulatory Visit: Payer: Self-pay | Admitting: Physician Assistant

## 2015-08-23 DIAGNOSIS — R002 Palpitations: Secondary | ICD-10-CM

## 2015-08-23 DIAGNOSIS — I428 Other cardiomyopathies: Secondary | ICD-10-CM

## 2015-08-28 ENCOUNTER — Ambulatory Visit (INDEPENDENT_AMBULATORY_CARE_PROVIDER_SITE_OTHER): Payer: Medicaid Other

## 2015-08-28 ENCOUNTER — Ambulatory Visit (HOSPITAL_COMMUNITY): Payer: Medicaid Other | Attending: Cardiovascular Disease

## 2015-08-28 ENCOUNTER — Telehealth: Payer: Self-pay | Admitting: Interventional Cardiology

## 2015-08-28 ENCOUNTER — Other Ambulatory Visit: Payer: Self-pay

## 2015-08-28 DIAGNOSIS — I351 Nonrheumatic aortic (valve) insufficiency: Secondary | ICD-10-CM | POA: Insufficient documentation

## 2015-08-28 DIAGNOSIS — Z87891 Personal history of nicotine dependence: Secondary | ICD-10-CM | POA: Insufficient documentation

## 2015-08-28 DIAGNOSIS — I340 Nonrheumatic mitral (valve) insufficiency: Secondary | ICD-10-CM | POA: Insufficient documentation

## 2015-08-28 DIAGNOSIS — E785 Hyperlipidemia, unspecified: Secondary | ICD-10-CM | POA: Diagnosis not present

## 2015-08-28 DIAGNOSIS — I429 Cardiomyopathy, unspecified: Secondary | ICD-10-CM | POA: Diagnosis present

## 2015-08-28 DIAGNOSIS — E119 Type 2 diabetes mellitus without complications: Secondary | ICD-10-CM | POA: Insufficient documentation

## 2015-08-28 DIAGNOSIS — I119 Hypertensive heart disease without heart failure: Secondary | ICD-10-CM | POA: Diagnosis not present

## 2015-08-28 DIAGNOSIS — I071 Rheumatic tricuspid insufficiency: Secondary | ICD-10-CM | POA: Diagnosis not present

## 2015-08-28 DIAGNOSIS — I428 Other cardiomyopathies: Secondary | ICD-10-CM

## 2015-08-28 DIAGNOSIS — R002 Palpitations: Secondary | ICD-10-CM | POA: Diagnosis not present

## 2015-08-28 NOTE — Telephone Encounter (Signed)
Walk in pt form-patient dropped off Letter-gave to [employer]

## 2015-08-29 ENCOUNTER — Telehealth: Payer: Self-pay | Admitting: Physician Assistant

## 2015-08-29 NOTE — Telephone Encounter (Signed)
F/u  Pt returning RN phone call- echo results. Please call back and discuss.   

## 2015-08-30 NOTE — Telephone Encounter (Signed)
Returned pts call.  Pt was made aware of his echo results and has been advised to continue his medication that he was restarted on and the importance of it. Pt verbalized understanding and appreciation.

## 2015-08-30 NOTE — Telephone Encounter (Signed)
-----   Message from Dyann Kief, [place] sent at 08/28/2015  3:35 PM EDT ----- EF was 25% got better 45% but he stopped meds and now down again.  Continue what  I started and f/u with Dr. Eldridge Dace

## 2015-09-06 ENCOUNTER — Telehealth: Payer: Self-pay | Admitting: Interventional Cardiology

## 2015-09-06 NOTE — Telephone Encounter (Signed)
New message      Calling to see if paperwork for [facility] has been completed.  Please call and give status on this paperwork

## 2015-09-10 NOTE — Telephone Encounter (Signed)
Corky Crafts, MD  Lorelle Formosa Via, LPN            I reviewed Mr. Mitchelle history. Review of information from the FDA showed that:   The FDA released details regarding adverse events in a 2008 document. These sections may be useful in your response to the patient:   "65% (42/65) of the cases reported the use of concomitant medications.Marland KitchenMarland Kitchen[and] contributing factors (medical history of labeled medications) were reported in almost half of the cases."   "Almost all cardiac associated cases (8/11, 73%) reported contributing medical history including HTN, chronic diastolic HF, cardiomyopathy, CABG, afib, HLD, or use of concomitant medications labeled for cardiac related events. Propoxyphene products may have had additive effects in these cases but a direct causal role could not be determined given the contributing factors cited."   Since he has a nonischemic cardiomyopathy that could cause arrhythmia, I cannot say that Darvocet caused his arrhythmia.   Everette Rank, MD, [facility]     The pt is advised of the above. He has a lot of questions and states that he does not think that his entire heart history was looked at before this decision was made. I explained to him that our pharmacists and Dr Eldridge Dace was involved in gathering his past medical history which includes the times that he was seen by Dr Eldridge Dace at the [place] office. He is requesting a time to come by the office to discuss this matter with Dr Eldridge Dace. or a sooner apt with Dr Eldridge Dace. He is advised that he has an appt scheduled with Dr Eldridge Dace on 09/27/15 and that Dr Eldridge Dace has no available openings in his schedule before then. He is requesting a copy of note that Dr Eldridge Dace wrote in regards to this matter. He is advised that he may pick up the papers that he dropped off for Dr Eldridge Dace to review and the note that Dr Eldridge Dace wrote concerning this matter anytime that is convenient for him between 8-5 Monday through Friday at our  front office. He verbalized understanding and thanked me for calling him back. He states that he will discuss this more with Dr Eldridge Dace at his f/u on 6/29.

## 2015-09-18 ENCOUNTER — Telehealth: Payer: Self-pay | Admitting: Interventional Cardiology

## 2015-09-18 NOTE — Telephone Encounter (Signed)
New Message  Pt call requesting to speak with RN. Pt states he left an envelope for Dr. Eldridge Dace at the front desk and wants to know did he receive the paperwork. Please call back to discuss.

## 2015-09-18 NOTE — Telephone Encounter (Signed)
Pt called to see if Dr. Eldridge Dace had received a  FDA information pt drop off last week the 15 th or 16 th at the front desk. The  FDA is a review of 2010. Pt is aware that Dr Eldridge Dace and Larita Fife his nurse are not here. MD's nurse will be here tomorrow 09/19/15. I will send this message to MD's nurse for verification. Pt verbalized understanding.

## 2015-09-19 NOTE — Telephone Encounter (Signed)
**Note De-Identified Kevin Long Obfuscation** The pt is advised that we did receive the information that he dropped off here at the office. He verbalized understanding.

## 2015-09-24 ENCOUNTER — Other Ambulatory Visit: Payer: Medicaid Other

## 2015-09-25 ENCOUNTER — Other Ambulatory Visit (INDEPENDENT_AMBULATORY_CARE_PROVIDER_SITE_OTHER): Payer: Medicaid Other

## 2015-09-25 DIAGNOSIS — I10 Essential (primary) hypertension: Secondary | ICD-10-CM | POA: Diagnosis not present

## 2015-09-25 DIAGNOSIS — E785 Hyperlipidemia, unspecified: Secondary | ICD-10-CM

## 2015-09-25 LAB — HEPATIC FUNCTION PANEL
ALK PHOS: 71 U/L (ref 40–115)
ALT: 14 U/L (ref 9–46)
AST: 15 U/L (ref 10–35)
Albumin: 3.8 g/dL (ref 3.6–5.1)
BILIRUBIN DIRECT: 0.1 mg/dL (ref <=0.2)
BILIRUBIN INDIRECT: 0.4 mg/dL (ref 0.2–1.2)
BILIRUBIN TOTAL: 0.5 mg/dL (ref 0.2–1.2)
Total Protein: 6.3 g/dL (ref 6.1–8.1)

## 2015-09-25 LAB — LIPID PANEL
CHOL/HDL RATIO: 4.4 ratio (ref <=5.0)
Cholesterol: 239 mg/dL — ABNORMAL HIGH (ref 125–200)
HDL: 54 mg/dL (ref >=40)
LDL CALC: 155 mg/dL — AB (ref <130)
TRIGLYCERIDES: 152 mg/dL — AB (ref <150)
VLDL: 30 mg/dL (ref <30)

## 2015-09-27 ENCOUNTER — Encounter: Payer: Self-pay | Admitting: Interventional Cardiology

## 2015-09-27 ENCOUNTER — Ambulatory Visit (INDEPENDENT_AMBULATORY_CARE_PROVIDER_SITE_OTHER): Payer: Medicaid Other | Admitting: Interventional Cardiology

## 2015-09-27 VITALS — BP 110/70 | HR 80 | Ht 66.0 in | Wt 265.0 lb

## 2015-09-27 DIAGNOSIS — E785 Hyperlipidemia, unspecified: Secondary | ICD-10-CM

## 2015-09-27 DIAGNOSIS — Z794 Long term (current) use of insulin: Secondary | ICD-10-CM | POA: Diagnosis not present

## 2015-09-27 DIAGNOSIS — I429 Cardiomyopathy, unspecified: Secondary | ICD-10-CM | POA: Diagnosis not present

## 2015-09-27 DIAGNOSIS — E119 Type 2 diabetes mellitus without complications: Secondary | ICD-10-CM | POA: Diagnosis not present

## 2015-09-27 DIAGNOSIS — I428 Other cardiomyopathies: Secondary | ICD-10-CM

## 2015-09-27 NOTE — Patient Instructions (Signed)
**Note De-Identified Kevin Long Obfuscation** Medication Instructions:  Same  Labwork: None  Testing/Procedures: None  Follow-Up: Your physician wants you to follow-up in: 1 year. You will receive a reminder letter in the mail two months in advance. If you don't receive a letter, please call our office to schedule the follow-up appointment.   Any Other Special Instructions Will Be Listed Below (If Applicable).     If you need a refill on your cardiac medications before your next appointment, please call your pharmacy.

## 2015-10-01 NOTE — Progress Notes (Signed)
Cardiology Office Note   Date:  10/01/2015   ID:  Kevin Long, DOB 02-12-1961, MRN [id number]  PCP:  Dorrene German, MD    No chief complaint on file.    Wt Readings from Last 3 Encounters:  09/27/15 265 lb (120.203 kg)  08/13/15 267 lb 12.8 oz (121.473 kg)  08/04/13 262 lb 1.9 oz (118.897 kg)       History of Present Illness: Kevin Long is a 55 y.o. male  with a history of a nonischemic cardiomyopathy. This was first diagnosed in 2008. At that time, his ejection fraction was 44% but there was no ischemia by stress testing. In 2014, he had a cardiac catheterization showing normal coronary arteries with an ejection fraction of 25%. His last echocardiogram in 2015 showed an ejection fraction of 40-45%.  He was seen a few months ago. He had been off of some of his medications. He is considering knee surgery. He is trying to lose weight prior to that time.  His biggest concern today is regarding his prior usage of Darvocet. He knows this was taken off the market. He apparently is considering seeking damages from the drug company made Darvocet since it was taken off the market. He requests a letter stating that we cannot be certain whether Darvocet did or did not cause his cardiac abnormality. He did start taking Darvocet prior to the stress test mentioned above in 2008. He took the medicine intermittently over a 4 year period.    Past Medical History  Diagnosis Date  . Diabetes mellitus, type 2 (HCC)   . Hypercholesterolemia   . Hypertension   . Asthma   . Obesity   . OSA (obstructive sleep apnea)   . Other primary cardiomyopathies     Past Surgical History  Procedure Laterality Date  . Foot surgery      has pin and screws  . Left and right heart catheterization with coronary angiogram N/A 08/04/2012    Procedure: LEFT AND RIGHT HEART CATHETERIZATION WITH CORONARY ANGIOGRAM;  Surgeon: Corky Crafts, MD;  Location: [facility] CATH LAB;  Service: Cardiovascular;   Laterality: N/A;     Current Outpatient Prescriptions  Medication Sig Dispense Refill  . albuterol (PROVENTIL HFA;VENTOLIN HFA) 108 (90 BASE) MCG/ACT inhaler Inhale 2 puffs into the lungs every 4 (four) hours as needed for wheezing or shortness of breath. For wheezing or shortness of breath    . atorvastatin (LIPITOR) 20 MG tablet Take 1 tablet (20 mg total) by mouth daily. 90 tablet 3  . carvedilol (COREG) 12.5 MG tablet Take 1 tablet (12.5 mg total) by mouth 2 (two) times daily. 180 tablet 3  . fluticasone (FLONASE) 50 MCG/ACT nasal spray Place 2 sprays into the nose daily as needed for allergies. For seasonal allergies    . furosemide (LASIX) 40 MG tablet Take 40 mg by mouth daily.    . Insulin Glargine (LANTUS SOLOSTAR) 100 UNIT/ML SOPN Inject 60 Units into the skin at bedtime.     . Iron-Vitamins (GERITOL COMPLETE) TABS Take 1 tablet by mouth daily.    . sitaGLIPtan-metformin (JANUMET) 50-500 MG per tablet Take 1 tablet by mouth 2 (two) times daily with a meal.     No current facility-administered medications for this visit.    Allergies:   Review of patient's allergies indicates no known allergies.    Social History:  The patient  reports that he quit smoking about 10 years ago. He does not have any smokeless tobacco  history on file. He reports that he does not drink alcohol.   Family History:  The patient's family history includes Anxiety disorder in his brother; Depression in his son; Diabetes in his mother; Heart murmur in his son. There is no history of Heart attack, Hypertension, or Stroke.    ROS:  Please see the history of present illness.   Otherwise, review of systems are positive for knee pain.   All other systems are reviewed and negative.    PHYSICAL EXAM: VS:  BP 110/70 mmHg  Pulse 80  Ht  (1.676 m)  Wt 265 lb (120.203 kg)  BMI 42.79 kg/m2 , BMI Body mass index is 42.79 kg/(m^2). GEN: Well nourished, well developed, in no acute distress HEENT: normal Neck:  no JVD, carotid bruits, or masses Cardiac: RRR; no murmurs, rubs, or gallops,no edema  Respiratory:  clear to auscultation bilaterally, normal work of breathing GI: soft, nontender, nondistended, + BS MS: no deformity or atrophy Skin: warm and dry, no rash Neuro:  Strength and sensation are intact Psych: euthymic mood, full affect     Recent Labs: 08/13/2015: BUN 8; Creat 1.19; Hemoglobin 13.3; Platelets 430*; Potassium 4.6; Sodium 138; TSH 2.17 09/25/2015: ALT 14   Lipid Panel    Component Value Date/Time   CHOL 239* 09/25/2015 0921   TRIG 152* 09/25/2015 0921   HDL 54 09/25/2015 0921   CHOLHDL 4.4 09/25/2015 0921   VLDL 30 09/25/2015 0921   LDLCALC 155* 09/25/2015 0921     Other studies Reviewed: Additional studies/ records that were reviewed today with results demonstrating: prior cath results and echo results as above.   ASSESSMENT AND PLAN:  1. Nonischemic cardiomyopathy.: Appears euvolemic 2. Prior darvocet usage.: We discussed this at length. He seems convinced that the Darvocet caused some of his cardiac issues. I explained to him that I could not be certain. There are many potential causes of cardiomyopathy. He requested that I write a letter saying that we could not determine definitively or could not rule out the possibility completely that Darvocet was the cause of his cardiomyopathy. I think it is fair to say that I don't know whether or not the Darvocet caused his cardiomyopathy, or possibly it could've been something else. 3. Hypelipidemia: LDL high. Continue atorvastatin 20 mg daily. 4. Diabetes: Weight loss will help DM control.  F/u with PMD.   Current medicines are reviewed at length with the patient today.  The patient concerns regarding his medicines were addressed.  The following changes have been made:  No change  Labs/ tests ordered today include:  No orders of the defined types were placed in this encounter.    Recommend 150 minutes/week of  aerobic exercise Low fat, low carb, high fiber diet recommended  Disposition:   FU in 1 year   Signed, Lance Muss, MD  10/01/2015 9:17 AM    [facility] [address] [place], [place], [place]  [address] Phone: [phone]; Fax: [phone]

## 2015-10-08 ENCOUNTER — Telehealth: Payer: Self-pay | Admitting: Interventional Cardiology

## 2015-10-08 NOTE — Telephone Encounter (Signed)
New Message  Pt call requesting to speak with RN about a letter he was to pick up. Please call back to discuss

## 2015-10-08 NOTE — Telephone Encounter (Addendum)
LMTCB. I left a message on the pt VM explaining that Dr Eldridge Dace is out of the office this week but that he plans to write this letter when he returns to the office.

## 2015-10-15 ENCOUNTER — Encounter: Payer: Self-pay | Admitting: Interventional Cardiology

## 2015-10-15 NOTE — Telephone Encounter (Signed)
To whom it may concern,  I have followed Mr. Cullinan for several years.  He has a nonischemic cardiomyopathy.  He is concerned that his prior Darvocet usage contributed to his heart problems.  I cannot say for certain whether or not his Darvocet usage did or did not contribute to his nonischemic cardiomyopathy.       Fredric Mare, MD, [facility]   Larita Fife, Please check with legal prior to sending this letter.

## 2015-10-15 NOTE — Telephone Encounter (Signed)
Follow up   Pt verbalized that he is calling to check up on letter that he wants to pick up  Educated pt that rn and Dr.Varanasi will be in the office 10/16/15

## 2015-10-15 NOTE — Telephone Encounter (Signed)
Will forward to Lynn/Dr Brazil

## 2015-10-17 NOTE — Telephone Encounter (Signed)
Letter was given to Collins Scotland on Tuesday morning 7/18 who placed letter on Angel's desk for her review when she arrives in the office.

## 2015-10-23 NOTE — Telephone Encounter (Signed)
New Message   Pt calling about the letter. Please call back. wy

## 2015-10-23 NOTE — Telephone Encounter (Signed)
**Note De-Identified Kevin Long Obfuscation** The pt is advised per Lawanna Kobus that his letter should be ready for him to pick up in 24 to 48 hours. The pt verbalized understanding and thanked me for calling him back.

## 2015-10-25 NOTE — Telephone Encounter (Signed)
Follow Up:    Pt wants to know if the letter is ready yet please.

## 2015-10-25 NOTE — Telephone Encounter (Signed)
**Note De-Identified Gem Conkle Obfuscation** The pt is advised that his letter is not ready to pick up at this time and that as soon as it is ready I will call and let him know. He verbalized understanding.

## 2015-10-26 NOTE — Telephone Encounter (Signed)
The pt is advised that his letter has been written/typed and that as soon as Dr Eldridge Dace signs it I will call him so he can come by to pick it up. He verbalized understanding and thanked me for keeping him up to date on the progress of his letter.

## 2015-10-29 ENCOUNTER — Telehealth: Payer: Self-pay

## 2015-10-29 NOTE — Telephone Encounter (Signed)
Called patient to inform him that Dr. Eldridge Dace had signed a note for him. Informed patient that he can pick up letter at front desk. Patient verbalized understanding.

## 2015-10-30 NOTE — Telephone Encounter (Signed)
The pt is advised that his letter is ready for him to pick up. Per his request I have placed the letter in the mail.

## 2015-11-30 ENCOUNTER — Encounter: Payer: Self-pay | Admitting: Interventional Cardiology

## 2015-12-03 NOTE — Progress Notes (Signed)
Cardiology Office Note   Date:  12/04/2015   ID:  Kevin Long, DOB 1960/10/14, MRN [id number]  PCP:  Dorrene German, MD    No chief complaint on file. prior Darvocet usage   Wt Readings from Last 3 Encounters:  12/04/15 273 lb 1.9 oz (123.9 kg)  09/27/15 265 lb (120.2 kg)  08/13/15 267 lb 12.8 oz (121.5 kg)       History of Present Illness: Kevin Long is a 55 y.o. male  with a history of a nonischemic cardiomyopathy. This was first diagnosed in 2008. At that time, his ejection fraction was 44% but there was no ischemia by stress testing. In 2014, he had a cardiac catheterization showing normal coronary arteries with an ejection fraction of 25%. His last echocardiogram in 2015 showed an ejection fraction of 40-45%.  He was seen a few months ago. He had been off of some of his medications. He was considering knee surgery. He is trying to lose weight prior to that time.  At his last visit, we spoke mostly about his prior Darvocet use and potential effects on the heart. He notes that the FDA to drug off the market and he is quite certain that the Darvocet is what caused his heart problem.   At that same visit, it appeared he was considering seeking damages from the drug company made Darvocet since it was taken off the market. He requests a letter stating that we cannot be certain whether Darvocet did or did not cause his cardiac abnormality. He did start taking Darvocet prior to the stress test mentioned above in 2008. He took the medicine intermittently over a 4 year period.  At that time, I gave him such a letter stating that I could not say for certain either way whether or not Darvocet causes cardiac problems. We discussed the wording of the letter and at that time, that would be sufficient for him.  Today, he comes in and is asking that I use a specific diagnosis code saying that he had cardiotoxicity from the drug. I explained that since we did not have normal echocardiogram  and then an abnormal echo with Darvocet usage in between, I couldn't for certain say that there was a causative relationship from the medicine in terms of his nonischemic cardiopathy. He was quite insistent that he took a "poison." He was quite irritated that I did not agree with his point and was not certain that Darvocet actually causes issue.     Past Medical History:  Diagnosis Date  . Asthma   . Diabetes mellitus, type 2 (HCC)   . Hypercholesterolemia   . Hypertension   . Obesity   . OSA (obstructive sleep apnea)   . Other primary cardiomyopathies     Past Surgical History:  Procedure Laterality Date  . FOOT SURGERY     has pin and screws  . LEFT AND RIGHT HEART CATHETERIZATION WITH CORONARY ANGIOGRAM N/A 08/04/2012   Procedure: LEFT AND RIGHT HEART CATHETERIZATION WITH CORONARY ANGIOGRAM;  Surgeon: Corky Crafts, MD;  Location: [facility] CATH LAB;  Service: Cardiovascular;  Laterality: N/A;     Current Outpatient Prescriptions  Medication Sig Dispense Refill  . albuterol (PROVENTIL HFA;VENTOLIN HFA) 108 (90 BASE) MCG/ACT inhaler Inhale 2 puffs into the lungs every 4 (four) hours as needed for wheezing or shortness of breath. For wheezing or shortness of breath    . carvedilol (COREG) 12.5 MG tablet Take 1 tablet (12.5 mg total) by mouth 2 (  two) times daily. 180 tablet 3  . fluticasone (FLONASE) 50 MCG/ACT nasal spray Place 2 sprays into the nose daily as needed for allergies. For seasonal allergies    . furosemide (LASIX) 40 MG tablet Take 40 mg by mouth daily.    . Insulin Glargine (LANTUS SOLOSTAR) 100 UNIT/ML SOPN Inject 60 Units into the skin at bedtime.     . sitaGLIPtan-metformin (JANUMET) 50-500 MG per tablet Take 1 tablet by mouth 2 (two) times daily with a meal.     No current facility-administered medications for this visit.     Allergies:   Review of patient's allergies indicates no known allergies.    Social History:  The patient  reports that he quit smoking  about 10 years ago. He has never used smokeless tobacco. He reports that he does not drink alcohol or use drugs.   Family History:  The patient's family history includes Anxiety disorder in his brother; Depression in his son; Diabetes in his mother; Heart murmur in his son.    ROS:  Please see the history of present illness.   Otherwise, review of systems are positive for knee pain.   All other systems are reviewed and negative.    PHYSICAL EXAM: VS:  BP 138/90   Pulse 93   Ht 5\' 6"  (1.676 m)   Wt 273 lb 1.9 oz (123.9 kg)   BMI 44.08 kg/m  , BMI Body mass index is 44.08 kg/m. GEN: Well nourished, well developed, in no acute distress  HEENT: normal  Neck: no JVD, carotid bruits, or masses Cardiac: RRR; no murmurs, rubs, or gallops,no edema  Respiratory:  clear to auscultation bilaterally, normal work of breathing GI: soft, nontender, nondistended, + BS MS: no deformity or atrophy  Skin: warm and dry, no rash Neuro:  Strength and sensation are intact Psych: euthymic mood, full affect     Recent Labs: 08/13/2015: BUN 8; Creat 1.19; Hemoglobin 13.3; Platelets 430; Potassium 4.6; Sodium 138; TSH 2.17 09/25/2015: ALT 14   Lipid Panel    Component Value Date/Time   CHOL 239 (H) 09/25/2015 0921   TRIG 152 (H) 09/25/2015 0921   HDL 54 09/25/2015 0921   CHOLHDL 4.4 09/25/2015 0921   VLDL 30 09/25/2015 0921   LDLCALC 155 (H) 09/25/2015 [id number]     Other studies Reviewed: Additional studies/ records that were reviewed today with results demonstrating: prior cath results and echo results as above.   ASSESSMENT AND PLAN:  1. Nonischemic cardiomyopathy.: Appears euvolemic.  Continue aggressive medical therapy- although at times medicines have fallen off his list.  He is currently not taking an ACE inhibitor. Continue beta blocker. I would like to get him started on an ACE inhibitor but given the issues with Darvocet use, he may have to see the heart failure clinic and have that issue  settled prior to starting any medication. 2. Prior darvocet usage.: We discussed this at length. He seems convinced that the Darvocet caused some of his cardiac issues. I explained to him that I could not be certain as there are people who have taken Darvocet and not had cardiac problems. There are also people who have nonischemic cardiomyopathy and has never taken Darvocet. There are many potential causes of cardiomyopathy. He would like another letter that established more causation between the Darvocet and his nonischemic cardiomyopathy. He also asked to use a specific ICD-9 code that states his cardiomyopathy came from drug toxicity. He is somewhat irritated that I don't immediately agreed to his  point that Darvocet caused his problem. It seems that it is in the realm of possibility that Darvocet could've contributed, but I was not comfortable saying for sure that that was the cause. After long discussion, I offered him a second opinion. I mentioned Dr. Verl Dicker name as a possible cardiologist to give a second opinion. He stated that "he knows about Dr. Jacinto Halim "and does not want to see him."  I then mentioned the heart failure clinic as a possibility. He was agreeable to this. They may have more expertise in determining a definitive cause of his cardiomyopathy.  He also now reports that he thinks he may have gotten some kidney disease from the Darvocet and because metabolites from Darvocet were not cleared from his system, this made his heart condition worse. I informed him I would not give any opinion about his kidney questions. 3. Hypelipidemia: LDL high. Continue atorvastatin 20 mg daily. 4. Diabetes: Weight loss will help DM control.  He still needs lifestyle modification to lose weight to treat obesity. F/u with PMD.     Current medicines are reviewed at length with the patient today.  The patient concerns regarding his medicines were addressed.  The following changes have been made:  No  change  Labs/ tests ordered today include:  No orders of the defined types were placed in this encounter.   Recommend 150 minutes/week of aerobic exercise Low fat, low carb, high fiber diet recommended  Disposition:   FU in 1 year   Signed, Lance Muss, MD  12/04/2015 1:41 PM    [facility] [address] [place], [place], [place]  [address] Phone: [phone]; Fax: [phone]

## 2015-12-04 ENCOUNTER — Encounter: Payer: Self-pay | Admitting: Interventional Cardiology

## 2015-12-04 ENCOUNTER — Ambulatory Visit (INDEPENDENT_AMBULATORY_CARE_PROVIDER_SITE_OTHER): Payer: Medicaid Other | Admitting: Interventional Cardiology

## 2015-12-04 VITALS — BP 138/90 | HR 93 | Ht 66.0 in | Wt 273.1 lb

## 2015-12-04 DIAGNOSIS — E785 Hyperlipidemia, unspecified: Secondary | ICD-10-CM

## 2015-12-04 DIAGNOSIS — E669 Obesity, unspecified: Secondary | ICD-10-CM | POA: Diagnosis not present

## 2015-12-04 DIAGNOSIS — I429 Cardiomyopathy, unspecified: Secondary | ICD-10-CM

## 2015-12-04 DIAGNOSIS — I428 Other cardiomyopathies: Secondary | ICD-10-CM

## 2015-12-04 NOTE — Patient Instructions (Addendum)
**Note De-Identified Miakoda Mcmillion Obfuscation** Medication Instructions:  Same-no changes  Labwork: None  Testing/Procedures: None  Follow-Up: Dr Eldridge Dace is referring you to our CHF clinic, we will arrange.     If you need a refill on your cardiac medications before your next appointment, please call your pharmacy.

## 2015-12-21 ENCOUNTER — Ambulatory Visit (HOSPITAL_COMMUNITY)
Admission: RE | Admit: 2015-12-21 | Discharge: 2015-12-21 | Disposition: A | Discharge status: Home / Self Care | Payer: Medicaid Other | Source: Ambulatory Visit | Attending: Cardiology | Admitting: Cardiology

## 2015-12-21 VITALS — BP 144/82 | HR 89 | Wt 270.8 lb

## 2015-12-21 DIAGNOSIS — I429 Cardiomyopathy, unspecified: Secondary | ICD-10-CM | POA: Diagnosis not present

## 2015-12-21 DIAGNOSIS — Z794 Long term (current) use of insulin: Secondary | ICD-10-CM | POA: Insufficient documentation

## 2015-12-21 DIAGNOSIS — I10 Essential (primary) hypertension: Secondary | ICD-10-CM | POA: Diagnosis not present

## 2015-12-21 DIAGNOSIS — I5022 Chronic systolic (congestive) heart failure: Secondary | ICD-10-CM | POA: Insufficient documentation

## 2015-12-21 DIAGNOSIS — G4733 Obstructive sleep apnea (adult) (pediatric): Secondary | ICD-10-CM | POA: Diagnosis not present

## 2015-12-21 DIAGNOSIS — Z87891 Personal history of nicotine dependence: Secondary | ICD-10-CM | POA: Diagnosis not present

## 2015-12-21 DIAGNOSIS — I428 Other cardiomyopathies: Secondary | ICD-10-CM

## 2015-12-21 DIAGNOSIS — I110 Hypertensive heart disease with heart failure: Secondary | ICD-10-CM | POA: Insufficient documentation

## 2015-12-21 DIAGNOSIS — E785 Hyperlipidemia, unspecified: Secondary | ICD-10-CM | POA: Insufficient documentation

## 2015-12-21 LAB — BASIC METABOLIC PANEL
Anion gap: 8 (ref 5–15)
BUN: 7 mg/dL (ref 6–20)
CHLORIDE: 105 mmol/L (ref 101–111)
CO2: 24 mmol/L (ref 22–32)
Calcium: 9.1 mg/dL (ref 8.9–10.3)
Creatinine, Ser: 0.94 mg/dL (ref 0.61–1.24)
GFR calc Af Amer: >60 mL/min (ref >60)
GFR calc non Af Amer: >60 mL/min (ref >60)
GLUCOSE: 141 mg/dL — AB (ref 65–99)
POTASSIUM: 4 mmol/L (ref 3.5–5.1)
Sodium: 137 mmol/L (ref 135–145)

## 2015-12-21 LAB — BRAIN NATRIURETIC PEPTIDE: B NATRIURETIC PEPTIDE 5: 79.5 pg/mL (ref 0.0–100.0)

## 2015-12-21 MED ORDER — SACUBITRIL-VALSARTAN 24-26 MG PO TABS: 1.0000 | ORAL_TABLET | Freq: Two times a day (BID) | ORAL | 3 refills | Status: DC

## 2015-12-21 NOTE — Patient Instructions (Signed)
Start Entresto 24/26 mg Twice daily   Take your Furosemide (Lasix) 40 mg every day  Labs today  Labs in 1 week  Your physician has requested that you have a cardiac MRI. Cardiac MRI uses a computer to create images of your heart as its beating, producing both still and moving pictures of your heart and major blood vessels. For further information please visit [email or web address]. Please follow the instruction sheet given to you today for more information.  Your physician recommends that you schedule a follow-up appointment in: 2 weeks

## 2015-12-23 NOTE — Progress Notes (Signed)
PCP: Dr. Concepcion ElkAvbuere Cardiology: Dr. Eldridge DaceVaranasi HF Cardiology: Dr. Shirlee LatchMcLean  55 yo with history of HTN, diabetes, and nonischemic cardiomyopathy with chronic systolic CHF presents for HF clinic evaluation.  Patient was diagnosed with a cardiomyopathy in around 2008.  He had left heart cath in 2014 with normal coronaries. He was never a heavy drinker and has not used drugs. No family history of cardiomyopathy.  Most recent echo in 5/17 showed EF 25-30%.  He is currently only taking Coreg and Lasix every other day.  He stopped taking atorvastatin, says it made him "feel fuzzy."    He notes dyspnea if he walks fast or walks up a flight stairs.  He went on Lasix about a month ago and is feeling better since then.  Orthopnea is improved, now only has to sleep on 2 pillows.  No chest pain.  Able to mow the grass recently.    He is very concerned that his cardiomyopathy came from Darvocet.   ECG: NSR, nonspecific T wave abnormalities.  Labs (6/17): LDL 155, K 4.6, creatinine 1.19  PMH: 1. Type II diabetes 2. Hyperlipidemia 3. HTN 4. OSA: Not using CPAP.  5. OA: Primarily knees.  6. Chronic systolic CHF: Nonischemic cardiomyopathy.   - Cardiolite 2008 with EF 44%, no ischemia.  - LHC (2014): EF 25%, normal coronaries.  - Echo (1/15): EF 25-30%. - Echo (5/17): EF 25-30%, moderate LV dilation, mild MR, PASP 48 mmHg.  7. PVCs: Noted by 30 day monitor, not quantified.  [place]: [place], quit smoking in his 40s, no drugs, no ETOH, used to be a IT trainertrucker.   FH: No cardiomyopathy or other cardiac history that he is aware of.  ROS: All systems reviewed and negative except as per HPI.   Current Outpatient Prescriptions  Medication Sig Dispense Refill  . albuterol (PROVENTIL HFA;VENTOLIN HFA) 108 (90 BASE) MCG/ACT inhaler Inhale 2 puffs into the lungs every 4 (four) hours as needed for wheezing or shortness of breath. For wheezing or shortness of breath    . carvedilol (COREG) 12.5 MG tablet Take 1  tablet (12.5 mg total) by mouth 2 (two) times daily. 180 tablet 3  . fluticasone (FLONASE) 50 MCG/ACT nasal spray Place 2 sprays into the nose daily as needed for allergies. For seasonal allergies    . furosemide (LASIX) 40 MG tablet Take 40 mg by mouth daily.    . Insulin Glargine (LANTUS SOLOSTAR) 100 UNIT/ML SOPN Inject 60 Units into the skin at bedtime.     . sitaGLIPtan-metformin (JANUMET) 50-500 MG per tablet Take 1 tablet by mouth 2 (two) times daily with a meal.    . sacubitril-valsartan (ENTRESTO) 24-26 MG Take 1 tablet by mouth 2 (two) times daily. 60 tablet 3   No current facility-administered medications for this encounter.    BP (!) 144/82   Pulse 89   Wt 270 lb 12 oz (122.8 kg)   SpO2 92%   BMI 43.70 kg/m  General: NAD Neck: JVP 11-12 cm, no thyromegaly or thyroid nodule.  Lungs: Clear to auscultation bilaterally with normal respiratory effort. CV: Nondisplaced PMI.  Heart regular S1/S2, no S3/S4, no murmur.  1+ ankle edema bilaterally.  No carotid bruit.  Normal pedal pulses.  Abdomen: Soft, nontender, no hepatosplenomegaly, no distention.  Skin: Intact without lesions or rashes.  Neurologic: Alert and oriented x 3.  Psych: Normal affect. Extremities: No clubbing or cyanosis.  HEENT: Normal.   Assessment/Plan: 1. Chronic systolic CHF: Nonischemic cardiomyopathy.  No family history  of cardiomyopathy, no heavy ETOH or drugs.  Last echo in 5/17 with EF 25-30%.  EF has been low since at least 2008.  NYHA class II-III symptoms.  On exam today, he remains volume overloaded.  - Increase Lasix to 40 mg daily rather than every other day.  - Add Entresto 24/26 bid.  - Continue current Coreg.  - BMET/BNP today and repeat in 1 week.  - Send SPEP. - I will arrange cardiac MRI to assess for infiltrative disease/prior myocarditis.  - Followup in 2 wks for re-evaluation.  - If EF remains low on good medical regimen, ICD would be reasonable (nonischemic cardiomyopathy but would lean  towards ICD with young age).  He would not be a CRT candidate.  2. OSA: Suspected. Does not want sleep study at this time.  3. HTN: Starting Entresto today.  4. Hyperlipidemia: LDL very high recently.  Given diabetes, he should be on statin.  He has not been able to tolerate atorvastatin or Crestor.  We have discussed trying pravastatin.  He will think about it.  5. Prior Darvocet use: Patient is concerned that Darvocet caused his cardiomyopathy.  Its use was associated temporally with diagnosis of cardiomyopathy.  Darvocet was taken off the market because of cardiac risk. Mechanistically, it seems that arrhythmias would be the major worry with Darvocet rather than CHF/cardiomyopathy.  However, cannot rule out Darvocet as contributing to his cardiac problems though there is really no way to prove causality.   Marca Ancona 12/23/2015

## 2015-12-24 LAB — PROTEIN ELECTROPHORESIS, SERUM
A/G Ratio: 1.1 (ref 0.7–1.7)
ALBUMIN ELP: 3.6 g/dL (ref 2.9–4.4)
ALPHA-1-GLOBULIN: 0.3 g/dL (ref 0.0–0.4)
ALPHA-2-GLOBULIN: 0.8 g/dL (ref 0.4–1.0)
Beta Globulin: 1.3 g/dL (ref 0.7–1.3)
GLOBULIN, TOTAL: 3.4 g/dL (ref 2.2–3.9)
Gamma Globulin: 1.1 g/dL (ref 0.4–1.8)
TOTAL PROTEIN ELP: 7 g/dL (ref 6.0–8.5)

## 2015-12-26 ENCOUNTER — Telehealth (HOSPITAL_COMMUNITY): Payer: Self-pay | Admitting: *Deleted

## 2015-12-26 NOTE — Telephone Encounter (Signed)
Patients insurance does not require pre cert. Message sent to [facility] to schedule

## 2015-12-31 ENCOUNTER — Ambulatory Visit (HOSPITAL_COMMUNITY)
Admission: RE | Admit: 2015-12-31 | Discharge: 2015-12-31 | Disposition: A | Discharge status: Home / Self Care | Payer: Medicaid Other | Source: Ambulatory Visit | Attending: Internal Medicine | Admitting: Internal Medicine

## 2015-12-31 DIAGNOSIS — I5023 Acute on chronic systolic (congestive) heart failure: Secondary | ICD-10-CM | POA: Diagnosis not present

## 2015-12-31 DIAGNOSIS — I428 Other cardiomyopathies: Secondary | ICD-10-CM | POA: Diagnosis present

## 2015-12-31 LAB — BASIC METABOLIC PANEL
ANION GAP: 8 (ref 5–15)
BUN: <5 mg/dL — ABNORMAL LOW (ref 6–20)
CALCIUM: 9.2 mg/dL (ref 8.9–10.3)
CO2: 29 mmol/L (ref 22–32)
Chloride: 103 mmol/L (ref 101–111)
Creatinine, Ser: 1.1 mg/dL (ref 0.61–1.24)
GLUCOSE: 90 mg/dL (ref 65–99)
POTASSIUM: 3.7 mmol/L (ref 3.5–5.1)
Sodium: 140 mmol/L (ref 135–145)

## 2015-12-31 LAB — BRAIN NATRIURETIC PEPTIDE: B NATRIURETIC PEPTIDE 5: 78.4 pg/mL (ref 0.0–100.0)

## 2016-01-04 ENCOUNTER — Encounter (HOSPITAL_COMMUNITY): Payer: Self-pay

## 2016-01-04 ENCOUNTER — Ambulatory Visit (HOSPITAL_COMMUNITY)
Admission: RE | Admit: 2016-01-04 | Discharge: 2016-01-04 | Disposition: A | Discharge status: Home / Self Care | Payer: Medicaid Other | Source: Ambulatory Visit | Attending: Cardiology | Admitting: Cardiology

## 2016-01-04 VITALS — BP 143/87 | HR 76 | Ht 66.0 in | Wt 274.8 lb

## 2016-01-04 DIAGNOSIS — I110 Hypertensive heart disease with heart failure: Secondary | ICD-10-CM | POA: Diagnosis not present

## 2016-01-04 DIAGNOSIS — I10 Essential (primary) hypertension: Secondary | ICD-10-CM

## 2016-01-04 DIAGNOSIS — E119 Type 2 diabetes mellitus without complications: Secondary | ICD-10-CM | POA: Diagnosis not present

## 2016-01-04 DIAGNOSIS — I5022 Chronic systolic (congestive) heart failure: Secondary | ICD-10-CM | POA: Insufficient documentation

## 2016-01-04 DIAGNOSIS — Z794 Long term (current) use of insulin: Secondary | ICD-10-CM | POA: Insufficient documentation

## 2016-01-04 DIAGNOSIS — G4733 Obstructive sleep apnea (adult) (pediatric): Secondary | ICD-10-CM | POA: Insufficient documentation

## 2016-01-04 DIAGNOSIS — E7800 Pure hypercholesterolemia, unspecified: Secondary | ICD-10-CM | POA: Diagnosis not present

## 2016-01-04 DIAGNOSIS — Z87891 Personal history of nicotine dependence: Secondary | ICD-10-CM | POA: Diagnosis not present

## 2016-01-04 DIAGNOSIS — E785 Hyperlipidemia, unspecified: Secondary | ICD-10-CM | POA: Diagnosis not present

## 2016-01-04 DIAGNOSIS — I428 Other cardiomyopathies: Secondary | ICD-10-CM | POA: Diagnosis not present

## 2016-01-04 MED ORDER — PRAVASTATIN SODIUM 20 MG PO TABS: 20.0000 mg | ORAL_TABLET | Freq: Every evening | ORAL | 6 refills | Status: DC

## 2016-01-04 MED ORDER — ASPIRIN EC 81 MG PO TBEC: 81.0000 mg | DELAYED_RELEASE_TABLET | Freq: Every day | ORAL | 3 refills | Status: DC

## 2016-01-04 MED ORDER — SPIRONOLACTONE 25 MG PO TABS: 12.5000 mg | ORAL_TABLET | Freq: Every day | ORAL | 3 refills | Status: DC

## 2016-01-04 NOTE — Patient Instructions (Signed)
Start Aspirin 81 mg daily  Start Pravastatin 20 mg daily  Start Spironolactone 12.5 mg (1/2 tab) daily  Labs in 10 days  You have been referred to [facility]  Your physician recommends that you schedule a follow-up appointment in: 6 weeks

## 2016-01-06 NOTE — Progress Notes (Signed)
PCP: Dr. Concepcion Long HF Cardiology: Dr. Shirlee Long  54 yo with history of HTN, diabetes, and nonischemic cardiomyopathy with chronic systolic CHF presents for HF clinic evaluation.  Patient was diagnosed with a cardiomyopathy in around 2008.  He had left heart cath in 2014 with normal coronaries. He was never a heavy drinker and has not used drugs. No family history of cardiomyopathy.  Most recent echo in 5/17 showed EF 25-30%.  He stopped taking atorvastatin, says it made him "feel fuzzy."    Overall breathing better with medication titration.  Able to mow his entire lawn without resting.  Ok walking in [place].  Generally, no dyspnea when walking on flat ground.   He is very concerned that his cardiomyopathy came from Darvocet.   ECG: NSR, poor RWP  Labs (6/17): LDL 155, K 4.6, creatinine 1.19 Labs (10/17): K 3.7, creatinine 1.1, BNP 78  PMH: 1. Type II diabetes 2. Hyperlipidemia 3. HTN 4. OSA: Not using CPAP.  5. OA: Primarily knees.  6. Chronic systolic CHF: Nonischemic cardiomyopathy.   - Cardiolite 2008 with EF 44%, no ischemia.  - LHC (2014): EF 25%, normal coronaries.  - Echo (1/15): EF 25-30%. - Echo (5/17): EF 25-30%, moderate LV dilation, mild MR, PASP 48 mmHg.  7. PVCs: Noted by 30 day monitor, not quantified.  SH: Lives in [place], quit smoking in his 40s, no drugs, no ETOH, used to be a IT trainer.   FH: No cardiomyopathy or other cardiac history that he is aware of.  ROS: All systems reviewed and negative except as per HPI.   Current Outpatient Prescriptions  Medication Sig Dispense Refill  . albuterol (PROVENTIL HFA;VENTOLIN HFA) 108 (90 BASE) MCG/ACT inhaler Inhale 2 puffs into the lungs every 4 (four) hours as needed for wheezing or shortness of breath. For wheezing or shortness of breath    . carvedilol (COREG) 12.5 MG tablet Take 1 tablet (12.5 mg total) by mouth 2 (two) times daily. 180 tablet 3  . fluticasone (FLONASE) 50 MCG/ACT nasal spray Place 2 sprays into the  nose daily as needed for allergies. For seasonal allergies    . furosemide (LASIX) 40 MG tablet Take 40 mg by mouth daily.    . Insulin Glargine (LANTUS SOLOSTAR) 100 UNIT/ML SOPN Inject 60 Units into the skin at bedtime.     . sacubitril-valsartan (ENTRESTO) 24-26 MG Take 1 tablet by mouth 2 (two) times daily. 60 tablet 3  . sitaGLIPtan-metformin (JANUMET) 50-500 MG per tablet Take 1 tablet by mouth 2 (two) times daily with a meal.    . aspirin EC 81 MG tablet Take 1 tablet (81 mg total) by mouth daily. 90 tablet 3  . pravastatin (PRAVACHOL) 20 MG tablet Take 1 tablet (20 mg total) by mouth every evening. 30 tablet 6  . spironolactone (ALDACTONE) 25 MG tablet Take 0.5 tablets (12.5 mg total) by mouth daily. 15 tablet 3   No current facility-administered medications for this encounter.    BP (!) 143/87 (BP Location: Left Arm, Patient Position: Sitting, Cuff Size: Large)   Pulse 76   Ht 5\' 6"  (1.676 m)   Wt 274 lb 12.8 oz (124.6 kg)   SpO2 97%   BMI 44.35 kg/m  General: NAD Neck: JVP 8 cm, no thyromegaly or thyroid nodule.  Lungs: Clear to auscultation bilaterally with normal respiratory effort. CV: Nondisplaced PMI.  Heart regular S1/S2, no S3/S4, no murmur.  Trace ankle edema.  No carotid bruit.  Normal pedal pulses.  Abdomen: Soft, nontender, no  hepatosplenomegaly, no distention.  Skin: Intact without lesions or rashes.  Neurologic: Alert and oriented x 3.  Psych: Normal affect. Extremities: No clubbing or cyanosis.  HEENT: Normal.   Assessment/Plan: 1. Chronic systolic CHF: Nonischemic cardiomyopathy.  No family history of cardiomyopathy, no heavy ETOH or drugs.  Last echo in 5/17 with EF 25-30%.  EF has been low since at least 2008.  NYHA class II-III symptoms.  Volume looks better on exam today compared to last appointment.  - Continue Lasix 40 mg daily.  - Continue Entresto 24/26 bid.  - Continue current Coreg.  - Add spironolactone 12.5 mg daily with BMET today and in 10  days.  - Awaiting cardiac MRI to assess for infiltrative disease/prior myocarditis.  - Followup in 3 weeks with HF pharmacist for medication titration then 6 wks with me. - If EF remains low on good medical regimen, ICD would be reasonable (nonischemic cardiomyopathy but would lean towards ICD with young age).  He would not be a CRT candidate.  2. OSA: Suspected. Does not want sleep study at this time.  3. HTN: Adding spironolactone today.  4. Hyperlipidemia: LDL very high recently.  Given diabetes, he should be on statin.  He has not been able to tolerate atorvastatin or Crestor.  He is willing to try pravastatin 20 mg daily, will order.  Check lipids/LFTs in 2 months.  5. Given diabetes, I would like him to take ASA 81 mg daily.  6. Prior Darvocet use: Patient is concerned that Darvocet caused his cardiomyopathy.  Its use was associated temporally with diagnosis of cardiomyopathy.  Darvocet was taken off the market because of cardiac risk. Mechanistically, it seems that arrhythmias would be the major worry with Darvocet rather than CHF/cardiomyopathy.  However, cannot rule out Darvocet as contributing to his cardiac problems though there is really no way to prove causality.   [place] 01/06/2016

## 2016-01-08 ENCOUNTER — Telehealth (HOSPITAL_COMMUNITY): Payer: Self-pay | Admitting: Pharmacist

## 2016-01-08 NOTE — Telephone Encounter (Signed)
Entresto 24-26 mg BID PA approved by [place] Medicaid through 12/21/16.   Tyler Deis. Bonnye Fava, PharmD, BCPS, CPP Clinical Pharmacist Pager: [phone] Phone: [phone] 01/08/2016 4:12 PM

## 2016-01-14 ENCOUNTER — Ambulatory Visit (HOSPITAL_COMMUNITY)
Admission: RE | Admit: 2016-01-14 | Discharge: 2016-01-14 | Disposition: A | Discharge status: Home / Self Care | Payer: Medicaid Other | Source: Ambulatory Visit | Attending: Internal Medicine | Admitting: Internal Medicine

## 2016-01-14 ENCOUNTER — Encounter: Payer: Self-pay | Admitting: Cardiology

## 2016-01-14 DIAGNOSIS — I428 Other cardiomyopathies: Secondary | ICD-10-CM | POA: Diagnosis present

## 2016-01-14 LAB — BASIC METABOLIC PANEL
ANION GAP: 8 (ref 5–15)
BUN: 7 mg/dL (ref 6–20)
CALCIUM: 9.1 mg/dL (ref 8.9–10.3)
CO2: 28 mmol/L (ref 22–32)
Chloride: 103 mmol/L (ref 101–111)
Creatinine, Ser: 1.15 mg/dL (ref 0.61–1.24)
GLUCOSE: 114 mg/dL — AB (ref 65–99)
Potassium: 3.7 mmol/L (ref 3.5–5.1)
Sodium: 139 mmol/L (ref 135–145)

## 2016-01-21 ENCOUNTER — Ambulatory Visit (HOSPITAL_COMMUNITY)
Admission: RE | Admit: 2016-01-21 | Discharge: 2016-01-21 | Disposition: A | Discharge status: Home / Self Care | Payer: Medicaid Other | Source: Ambulatory Visit | Attending: Cardiology | Admitting: Cardiology

## 2016-01-21 ENCOUNTER — Encounter (HOSPITAL_COMMUNITY): Payer: Self-pay

## 2016-01-21 DIAGNOSIS — I428 Other cardiomyopathies: Secondary | ICD-10-CM

## 2016-01-28 ENCOUNTER — Inpatient Hospital Stay (HOSPITAL_COMMUNITY): Admission: RE | Admit: 2016-01-28 | Payer: Medicaid Other | Source: Ambulatory Visit

## 2016-02-15 ENCOUNTER — Ambulatory Visit (HOSPITAL_COMMUNITY)
Admission: RE | Admit: 2016-02-15 | Discharge: 2016-02-15 | Disposition: A | Discharge status: Home / Self Care | Payer: Medicaid Other | Source: Ambulatory Visit | Attending: Cardiology | Admitting: Cardiology

## 2016-02-15 ENCOUNTER — Encounter (HOSPITAL_COMMUNITY): Payer: Self-pay

## 2016-02-15 VITALS — BP 128/66 | HR 73 | Wt 275.5 lb

## 2016-02-15 DIAGNOSIS — E7800 Pure hypercholesterolemia, unspecified: Secondary | ICD-10-CM | POA: Diagnosis not present

## 2016-02-15 DIAGNOSIS — M170 Bilateral primary osteoarthritis of knee: Secondary | ICD-10-CM | POA: Diagnosis not present

## 2016-02-15 DIAGNOSIS — I5022 Chronic systolic (congestive) heart failure: Secondary | ICD-10-CM | POA: Insufficient documentation

## 2016-02-15 DIAGNOSIS — I110 Hypertensive heart disease with heart failure: Secondary | ICD-10-CM | POA: Diagnosis present

## 2016-02-15 DIAGNOSIS — I428 Other cardiomyopathies: Secondary | ICD-10-CM | POA: Diagnosis not present

## 2016-02-15 DIAGNOSIS — E119 Type 2 diabetes mellitus without complications: Secondary | ICD-10-CM | POA: Diagnosis not present

## 2016-02-15 DIAGNOSIS — Z87891 Personal history of nicotine dependence: Secondary | ICD-10-CM | POA: Insufficient documentation

## 2016-02-15 DIAGNOSIS — G4733 Obstructive sleep apnea (adult) (pediatric): Secondary | ICD-10-CM | POA: Diagnosis not present

## 2016-02-15 DIAGNOSIS — Z7982 Long term (current) use of aspirin: Secondary | ICD-10-CM | POA: Insufficient documentation

## 2016-02-15 DIAGNOSIS — I429 Cardiomyopathy, unspecified: Secondary | ICD-10-CM | POA: Diagnosis not present

## 2016-02-15 DIAGNOSIS — E785 Hyperlipidemia, unspecified: Secondary | ICD-10-CM | POA: Insufficient documentation

## 2016-02-15 DIAGNOSIS — Z794 Long term (current) use of insulin: Secondary | ICD-10-CM | POA: Diagnosis not present

## 2016-02-15 LAB — BASIC METABOLIC PANEL
Anion gap: 7 (ref 5–15)
BUN: 8 mg/dL (ref 6–20)
CALCIUM: 8.9 mg/dL (ref 8.9–10.3)
CO2: 25 mmol/L (ref 22–32)
CREATININE: 1.1 mg/dL (ref 0.61–1.24)
Chloride: 106 mmol/L (ref 101–111)
GFR calc Af Amer: >60 mL/min (ref >60)
GLUCOSE: 141 mg/dL — AB (ref 65–99)
Potassium: 4.1 mmol/L (ref 3.5–5.1)
Sodium: 138 mmol/L (ref 135–145)

## 2016-02-15 MED ORDER — ISOSORB DINITRATE-HYDRALAZINE 20-37.5 MG PO TABS: 1.0000 | ORAL_TABLET | Freq: Three times a day (TID) | ORAL | 3 refills | Status: DC

## 2016-02-15 NOTE — Patient Instructions (Signed)
Start Bidil 1 tab Three times a day   Take Furosemide (Lasix) as needed  Lab today  You have been referred to Cardiac Rehab, they will call you to schedule  Your physician recommends that you schedule a follow-up appointment in: 1 month

## 2016-02-16 NOTE — Progress Notes (Signed)
PCP: Dr. Concepcion ElkAvbuere HF Cardiology: Dr. Shirlee LatchMcLean  [age] yo with history of HTN, diabetes, and nonischemic cardiomyopathy with chronic systolic CHF presents for HF clinic evaluation.  Patient was diagnosed with a cardiomyopathy in around 2008.  He had left heart cath in 2014 with normal coronaries. He was never a heavy drinker and has not used drugs. No family history of cardiomyopathy.  Most recent echo in 5/17 showed EF 25-30%.  He stopped taking atorvastatin, says it made him "feel fuzzy."  I tried him on pravastatin, but this did the same thing and he stopped it.  He was unable to fit in MRI for cardiac MRI.   Overall breathing better with medication titration.  Able to do yardwork without problems.  Generally, no dyspnea when walking on flat ground.  No chest pain.  No orthopnea/PND.  No lightheadedness.    He is very concerned that his cardiomyopathy came from Darvocet.   Labs (6/17): LDL 155, K 4.6, creatinine 1.19 Labs (10/17): K 3.7, creatinine 1.1, BNP 78  PMH: 1. Type II diabetes 2. Hyperlipidemia: has not tolerated atorvastatin or pravastatin.  3. HTN 4. OSA: Not using CPAP.  5. OA: Primarily knees.  6. Chronic systolic CHF: Nonischemic cardiomyopathy.   - Cardiolite 2008 with EF 44%, no ischemia.  - LHC (2014): EF 25%, normal coronaries.  - Echo (1/15): EF 25-30%. - Echo (5/17): EF 25-30%, moderate LV dilation, mild MR, PASP 48 mmHg.  7. PVCs: Noted by 30 day monitor, not quantified.  SH: Lives in [place], quit smoking in his 40s, no drugs, no ETOH, used to be a IT trainertrucker.   FH: No cardiomyopathy or other cardiac history that he is aware of.  ROS: All systems reviewed and negative except as per HPI.   Current Outpatient Prescriptions  Medication Sig Dispense Refill  . albuterol (PROVENTIL HFA;VENTOLIN HFA) 108 (90 BASE) MCG/ACT inhaler Inhale 2 puffs into the lungs every 4 (four) hours as needed for wheezing or shortness of breath. For wheezing or shortness of breath    .  aspirin EC 81 MG tablet Take 1 tablet (81 mg total) by mouth daily. 90 tablet 3  . carvedilol (COREG) 12.5 MG tablet Take 1 tablet (12.5 mg total) by mouth 2 (two) times daily. 180 tablet 3  . fluticasone (FLONASE) 50 MCG/ACT nasal spray Place 2 sprays into the nose daily as needed for allergies. For seasonal allergies    . furosemide (LASIX) 40 MG tablet Take 40 mg by mouth daily.    . Insulin Glargine (LANTUS SOLOSTAR) 100 UNIT/ML SOPN Inject 60 Units into the skin at bedtime.     . sacubitril-valsartan (ENTRESTO) 24-26 MG Take 1 tablet by mouth 2 (two) times daily. 60 tablet 3  . sitaGLIPtan-metformin (JANUMET) 50-500 MG per tablet Take 1 tablet by mouth 2 (two) times daily with a meal.    . spironolactone (ALDACTONE) 25 MG tablet Take 0.5 tablets (12.5 mg total) by mouth daily. 15 tablet 3  . isosorbide-hydrALAZINE (BIDIL) 20-37.5 MG tablet Take 1 tablet by mouth 3 (three) times daily. 90 tablet 3   No current facility-administered medications for this encounter.    BP 128/66   Pulse 73   Wt 275 lb 8 oz (125 kg)   SpO2 98%   BMI 44.47 kg/m  General: NAD Neck: JVP 7 cm, no thyromegaly or thyroid nodule.  Lungs: Clear to auscultation bilaterally with normal respiratory effort. CV: Nondisplaced PMI.  Heart regular S1/S2, no S3/S4, no murmur.  Trace ankle edema.  No carotid bruit.  Normal pedal pulses.  Abdomen: Soft, nontender, no hepatosplenomegaly, no distention.  Skin: Intact without lesions or rashes.  Neurologic: Alert and oriented x 3.  Psych: Normal affect. Extremities: No clubbing or cyanosis.  HEENT: Normal.   Assessment/Plan: 1. Chronic systolic CHF: Nonischemic cardiomyopathy.  No family history of cardiomyopathy, no heavy ETOH or drugs.  Last echo in 5/17 with EF 25-30%.  EF has been low since at least 2008.  NYHA class II symptoms.  He is not volume overloaded.  He was unable to fit for cardiac MRI. - He has been taking Lasix only prn.  - Continue Entresto 24/26 bid.  -  Continue current Coreg.  - Continue spironolactone 12.5 daily.   - Add Bidil 1 tab tid.  - If EF remains low on good medical regimen, ICD would be reasonable (nonischemic cardiomyopathy but would lean towards ICD with young age).  He would not be a CRT candidate. Will repeat echo in 1/18.  - I will refer him to cardiac rehab.  2. OSA: Suspected. Does not want sleep study at this time.  3. HTN: BP improved.   4. Hyperlipidemia: He has not been able to tolerate atorvastatin or Crestor.  I tried him on pravastatin but he developed the same feeling of "fuzziness" that he had with the other statins.  Followup in 1 month.   Marca Ancona 02/16/2016

## 2016-03-17 ENCOUNTER — Ambulatory Visit (HOSPITAL_COMMUNITY)
Admission: RE | Admit: 2016-03-17 | Discharge: 2016-03-17 | Disposition: A | Discharge status: Home / Self Care | Payer: Medicaid Other | Source: Ambulatory Visit | Attending: Cardiology | Admitting: Cardiology

## 2016-03-17 ENCOUNTER — Encounter (HOSPITAL_COMMUNITY): Payer: Self-pay

## 2016-03-17 VITALS — BP 134/86 | HR 73 | Wt 274.2 lb

## 2016-03-17 DIAGNOSIS — I5022 Chronic systolic (congestive) heart failure: Secondary | ICD-10-CM

## 2016-03-17 DIAGNOSIS — E119 Type 2 diabetes mellitus without complications: Secondary | ICD-10-CM | POA: Insufficient documentation

## 2016-03-17 DIAGNOSIS — Z794 Long term (current) use of insulin: Secondary | ICD-10-CM | POA: Insufficient documentation

## 2016-03-17 DIAGNOSIS — E785 Hyperlipidemia, unspecified: Secondary | ICD-10-CM | POA: Diagnosis not present

## 2016-03-17 DIAGNOSIS — G4733 Obstructive sleep apnea (adult) (pediatric): Secondary | ICD-10-CM | POA: Diagnosis not present

## 2016-03-17 DIAGNOSIS — I429 Cardiomyopathy, unspecified: Secondary | ICD-10-CM | POA: Diagnosis not present

## 2016-03-17 DIAGNOSIS — Z87891 Personal history of nicotine dependence: Secondary | ICD-10-CM | POA: Diagnosis not present

## 2016-03-17 DIAGNOSIS — I10 Essential (primary) hypertension: Secondary | ICD-10-CM | POA: Diagnosis not present

## 2016-03-17 DIAGNOSIS — E784 Other hyperlipidemia: Secondary | ICD-10-CM | POA: Diagnosis not present

## 2016-03-17 DIAGNOSIS — Z7982 Long term (current) use of aspirin: Secondary | ICD-10-CM | POA: Diagnosis not present

## 2016-03-17 DIAGNOSIS — I428 Other cardiomyopathies: Secondary | ICD-10-CM

## 2016-03-17 DIAGNOSIS — M170 Bilateral primary osteoarthritis of knee: Secondary | ICD-10-CM | POA: Insufficient documentation

## 2016-03-17 DIAGNOSIS — I110 Hypertensive heart disease with heart failure: Secondary | ICD-10-CM | POA: Diagnosis not present

## 2016-03-17 DIAGNOSIS — E7849 Other hyperlipidemia: Secondary | ICD-10-CM

## 2016-03-17 MED ORDER — SACUBITRIL-VALSARTAN 49-51 MG PO TABS: 1.0000 | ORAL_TABLET | Freq: Two times a day (BID) | ORAL | 3 refills | Status: DC

## 2016-03-17 NOTE — Patient Instructions (Signed)
Increase Entresto to 49/51 mg Twice daily   Labs in 10 days  Your physician recommends that you schedule a follow-up appointment in: 4 weeks

## 2016-03-17 NOTE — Progress Notes (Signed)
PCP: Dr. Concepcion Elk HF Cardiology: Dr. Shirlee Latch  55 yo with history of HTN, diabetes, and nonischemic cardiomyopathy with chronic systolic CHF presents for HF clinic evaluation.  Patient was diagnosed with a cardiomyopathy in around 2008.  He had left heart cath in 2014 with normal coronaries. He was never a heavy drinker and has not used drugs. No family history of cardiomyopathy.  Most recent echo in 5/17 showed EF 25-30%.  He stopped taking atorvastatin, says it made him "feel fuzzy."  Intolerant pravastatin, but this did the same thing and he stopped it.  He was unable to fit in MRI for cardiac MRI.   He returns for HF follow up. Last visit bidil added however he stopped because his BP dropped into the 90s. Says he was dizzy and felt awful.  Overall feeling fair. SOB with brisk walking. SOB with steps. He is not weighing at home. Does not have scale. + Orthopnea. Tries to limit fluids. Taking all medications.   Labs (6/17): LDL 155, K 4.6, creatinine 1.19 Labs (10/17): K 3.7, creatinine 1.1, BNP 78 Labs (02/15/2016) : K 4.1 Creatinine 1.1  PMH: 1. Type II diabetes 2. Hyperlipidemia: has not tolerated atorvastatin or pravastatin.  3. HTN 4. OSA: Not using CPAP.  5. OA: Primarily knees.  6. Chronic systolic CHF: Nonischemic cardiomyopathy.   - Cardiolite 2008 with EF 44%, no ischemia.  - LHC (2014): EF 25%, normal coronaries.  - Echo (1/15): EF 25-30%. - Echo (5/17): EF 25-30%, moderate LV dilation, mild MR, PASP 48 mmHg.  7. PVCs: Noted by 30 day monitor, not quantified.  SH: Lives in [place], quit smoking in his 40s, no drugs, no ETOH, used to be a IT trainer.   FH: No cardiomyopathy or other cardiac history that he is aware of.  ROS: All systems reviewed and negative except as per HPI.   Current Outpatient Prescriptions  Medication Sig Dispense Refill  . albuterol (PROVENTIL HFA;VENTOLIN HFA) 108 (90 BASE) MCG/ACT inhaler Inhale 2 puffs into the lungs every 4 (four) hours as needed  for wheezing or shortness of breath. For wheezing or shortness of breath    . aspirin EC 81 MG tablet Take 1 tablet (81 mg total) by mouth daily. 90 tablet 3  . carvedilol (COREG) 12.5 MG tablet Take 1 tablet (12.5 mg total) by mouth 2 (two) times daily. 180 tablet 3  . fluticasone (FLONASE) 50 MCG/ACT nasal spray Place 2 sprays into the nose daily as needed for allergies. For seasonal allergies    . furosemide (LASIX) 40 MG tablet Take 40 mg by mouth daily.    . Insulin Glargine (LANTUS SOLOSTAR) 100 UNIT/ML SOPN Inject 60 Units into the skin at bedtime.     . sacubitril-valsartan (ENTRESTO) 24-26 MG Take 1 tablet by mouth 2 (two) times daily. 60 tablet 3  . sitaGLIPtan-metformin (JANUMET) 50-500 MG per tablet Take 1 tablet by mouth 2 (two) times daily with a meal.    . spironolactone (ALDACTONE) 25 MG tablet Take 0.5 tablets (12.5 mg total) by mouth daily. 15 tablet 3   No current facility-administered medications for this encounter.    BP 134/86   Pulse 73   Wt 274 lb 4 oz (124.4 kg)   SpO2 97%   BMI 44.27 kg/m  General: NAD Neck: JVP 9-10 cm, no thyromegaly or thyroid nodule.  Lungs: Clear to auscultation bilaterally with normal respiratory effort. CV: Nondisplaced PMI.  Heart regular S1/S2, no S3/S4, no murmur.  R and LLE 1+edema.  No  carotid bruit.  Normal pedal pulses.  Abdomen: Soft, nontender, no hepatosplenomegaly, + distention.  Skin: Intact without lesions or rashes.  Neurologic: Alert and oriented x 3.  Psych: Normal affect. Extremities: No clubbing or cyanosis.  HEENT: Normal.   Assessment/Plan: 1. Chronic systolic CHF: Nonischemic cardiomyopathy.  No family history of cardiomyopathy, no heavy ETOH or drugs.  Last echo in 5/17 with EF 25-30%.  EF has been low since at least 2008.  Unable to fit for CMRI.  NYHA class II-III symptoms.  Mild volume overload.  He has been taking Lasix only prn.  - Increase  Entresto 49-51 mg twice a day.   - Continue current Coreg.  -  Continue spironolactone 12.5 daily.   - Intolerant bidil due to hypotension. Says he does not wan to try at a reduced dose.   - If EF remains low on good medical regimen, ICD would be reasonable (nonischemic cardiomyopathy but would lean towards ICD with young age).  He would not be a CRT candidate. Will repeat echo in 1/18.  -Today I provided a weight chart and discussed discuss daily weights,.  2. OSA: Suspected. Does not want sleep study at this time.  3. HTN: BP improved.   4. Hyperlipidemia: He has not been able to tolerate atorvastatin or Crestor.  Intolerant pravastatin but he developed the same feeling of "fuzziness" that he had with the other statins.  Follow up 10 days with BMET.  Follow up in 1 month.   Faelyn Sigler NP-C  03/17/2016

## 2016-03-27 ENCOUNTER — Encounter (HOSPITAL_COMMUNITY): Payer: Self-pay | Admitting: *Deleted

## 2016-03-27 ENCOUNTER — Ambulatory Visit (HOSPITAL_COMMUNITY)
Admission: RE | Admit: 2016-03-27 | Discharge: 2016-03-27 | Disposition: A | Discharge status: Home / Self Care | Payer: Medicaid Other | Source: Ambulatory Visit | Attending: Cardiology | Admitting: Cardiology

## 2016-03-27 DIAGNOSIS — I428 Other cardiomyopathies: Secondary | ICD-10-CM | POA: Diagnosis not present

## 2016-03-27 LAB — BASIC METABOLIC PANEL
ANION GAP: 8 (ref 5–15)
BUN: 6 mg/dL (ref 6–20)
CHLORIDE: 101 mmol/L (ref 101–111)
CO2: 26 mmol/L (ref 22–32)
Calcium: 9.1 mg/dL (ref 8.9–10.3)
Creatinine, Ser: 1.15 mg/dL (ref 0.61–1.24)
GFR calc Af Amer: >60 mL/min (ref >60)
Glucose, Bld: 211 mg/dL — ABNORMAL HIGH (ref 65–99)
POTASSIUM: 3.6 mmol/L (ref 3.5–5.1)
SODIUM: 135 mmol/L (ref 135–145)

## 2016-03-28 ENCOUNTER — Encounter (HOSPITAL_COMMUNITY): Payer: Self-pay

## 2016-03-28 ENCOUNTER — Encounter (HOSPITAL_COMMUNITY): Payer: Self-pay | Admitting: Student

## 2016-03-28 DIAGNOSIS — I493 Ventricular premature depolarization: Secondary | ICD-10-CM

## 2016-03-28 HISTORY — DX: Ventricular premature depolarization: I49.3

## 2016-04-08 ENCOUNTER — Inpatient Hospital Stay (HOSPITAL_COMMUNITY): Admission: RE | Admit: 2016-04-08 | Payer: Medicaid Other | Source: Ambulatory Visit

## 2016-04-08 ENCOUNTER — Telehealth (HOSPITAL_COMMUNITY): Payer: Self-pay | Admitting: *Deleted

## 2016-04-14 ENCOUNTER — Encounter (HOSPITAL_COMMUNITY): Payer: Self-pay | Admitting: *Deleted

## 2016-04-14 ENCOUNTER — Encounter (HOSPITAL_COMMUNITY): Payer: Self-pay

## 2016-04-14 ENCOUNTER — Ambulatory Visit (HOSPITAL_COMMUNITY)
Admission: RE | Admit: 2016-04-14 | Discharge: 2016-04-14 | Disposition: A | Discharge status: Home / Self Care | Payer: Medicaid Other | Source: Ambulatory Visit | Attending: Cardiology | Admitting: Cardiology

## 2016-04-14 VITALS — BP 132/90 | HR 61 | Wt 272.0 lb

## 2016-04-14 DIAGNOSIS — E785 Hyperlipidemia, unspecified: Secondary | ICD-10-CM | POA: Diagnosis not present

## 2016-04-14 DIAGNOSIS — I10 Essential (primary) hypertension: Secondary | ICD-10-CM | POA: Diagnosis not present

## 2016-04-14 DIAGNOSIS — Z7982 Long term (current) use of aspirin: Secondary | ICD-10-CM | POA: Diagnosis not present

## 2016-04-14 DIAGNOSIS — I110 Hypertensive heart disease with heart failure: Secondary | ICD-10-CM | POA: Diagnosis not present

## 2016-04-14 DIAGNOSIS — Z79899 Other long term (current) drug therapy: Secondary | ICD-10-CM | POA: Diagnosis not present

## 2016-04-14 DIAGNOSIS — I428 Other cardiomyopathies: Secondary | ICD-10-CM | POA: Insufficient documentation

## 2016-04-14 DIAGNOSIS — I493 Ventricular premature depolarization: Secondary | ICD-10-CM

## 2016-04-14 DIAGNOSIS — Z794 Long term (current) use of insulin: Secondary | ICD-10-CM | POA: Insufficient documentation

## 2016-04-14 DIAGNOSIS — I5022 Chronic systolic (congestive) heart failure: Secondary | ICD-10-CM | POA: Insufficient documentation

## 2016-04-14 DIAGNOSIS — E119 Type 2 diabetes mellitus without complications: Secondary | ICD-10-CM | POA: Insufficient documentation

## 2016-04-14 MED ORDER — SACUBITRIL-VALSARTAN 49-51 MG PO TABS: 1.0000 | ORAL_TABLET | Freq: Two times a day (BID) | ORAL | 3 refills | Status: DC

## 2016-04-14 NOTE — Progress Notes (Signed)
PCP: Dr. Concepcion Elk HF Cardiology: Dr. Shirlee Latch  56 yo with history of HTN, diabetes, and nonischemic cardiomyopathy with chronic systolic CHF presents for HF clinic evaluation.  Patient was diagnosed with a cardiomyopathy in around 2008.  He had left heart cath in 2014 with normal coronaries. He was never a heavy drinker and has not used drugs. No family history of cardiomyopathy.  Most recent echo in 5/17 showed EF 25-30%.  He stopped taking atorvastatin, says it made him "feel fuzzy."  Intolerant pravastatin, but this did the same thing and he stopped it.  He was unable to fit in MRI for cardiac MRI.   He returns for HF follow up. Last visit entresto was increased to 49-51 mg twice a day, however he continued  the lower dose of  24-26 mg twice a day because he said the pharmacy would not increase because it was less than 30 days. Mild dyspnea with brisk walking. Denies PND/Orthopnea. No fever chills. Weight at home 268 pounds. Not exercising much. Wants to go cardiac rehab.  Following low salt diet. Drinking < 2 liters. Taking all other medications.   Labs (6/17): LDL 155, K 4.6, creatinine 1.19 Labs (10/17): K 3.7, creatinine 1.1, BNP 78 Labs (02/15/2016) : K 4.1 Creatinine 1.1 Labs (03/27/2016): K 3.6 Creatinine 1.15  PMH: 1. Type II diabetes 2. Hyperlipidemia: has not tolerated atorvastatin or pravastatin.  3. HTN 4. OSA: Not using CPAP.  5. OA: Primarily knees.  6. Chronic systolic CHF: Nonischemic cardiomyopathy.   - Cardiolite 2008 with EF 44%, no ischemia.  - LHC (2014): EF 25%, normal coronaries.  - Echo (1/15): EF 25-30%. - Echo (5/17): EF 25-30%, moderate LV dilation, mild MR, PASP 48 mmHg.  7. PVCs: 2008 Noted by 30 day monitor, not quantified.  SH: Lives in [place], quit smoking in his 40s, no drugs, no ETOH, used to be a IT trainer.   FH: No cardiomyopathy or other cardiac history that he is aware of.  ROS: All systems reviewed and negative except as per HPI.   Current  Outpatient Prescriptions  Medication Sig Dispense Refill  . albuterol (PROVENTIL HFA;VENTOLIN HFA) 108 (90 BASE) MCG/ACT inhaler Inhale 2 puffs into the lungs every 4 (four) hours as needed for wheezing or shortness of breath. For wheezing or shortness of breath    . aspirin EC 81 MG tablet Take 1 tablet (81 mg total) by mouth daily. 90 tablet 3  . carvedilol (COREG) 12.5 MG tablet Take 1 tablet (12.5 mg total) by mouth 2 (two) times daily. 180 tablet 3  . fluticasone (FLONASE) 50 MCG/ACT nasal spray Place 2 sprays into the nose daily as needed for allergies. For seasonal allergies    . furosemide (LASIX) 40 MG tablet Take 40 mg by mouth daily.    . Insulin Glargine (LANTUS SOLOSTAR) 100 UNIT/ML SOPN Inject 60 Units into the skin at bedtime.     . sacubitril-valsartan (ENTRESTO) 24-26 MG Take 1 tablet by mouth 2 (two) times daily.    Marland Kitchen spironolactone (ALDACTONE) 25 MG tablet Take 0.5 tablets (12.5 mg total) by mouth daily. 15 tablet 3   No current facility-administered medications for this encounter.    BP 132/90   Pulse 61   Wt 272 lb (123.4 kg)   SpO2 98%   BMI 43.90 kg/m  General: NAD. Walked in the clinic.  Neck: JVP 5-6 cm, no thyromegaly or thyroid nodule.  Lungs: CTAB. CV: Nondisplaced PMI.  Heart regular S1/S2, no S3/S4, no murmur.  R  and LLE trace edema.  No carotid bruit.  Normal pedal pulses.  Abdomen: Soft, nontender, no hepatosplenomegaly, + distention.  Skin: Intact without lesions or rashes.  Neurologic: Alert and oriented x 3.  Psych: Normal affect. Extremities: No clubbing or cyanosis. Warm HEENT: Normal.   Assessment/Plan: 1. Chronic systolic CHF: Nonischemic cardiomyopathy.  No family history of cardiomyopathy, no heavy ETOH or drugs.  Last echo in 5/17 with EF 25-30%.  EF has been low since at least 2008.  Unable to fit for CMRI.  NYHA class II-III symptoms.  Mild volume overload.  He has been taking Lasix only prn.  - Increase  Entresto 49-51 mg twice a day again  today. I have asked the pharmacist to help up get entresto approved. Plan to repeat BMET in 10 days.  - Continue current Coreg.  - Continue spiro dose 12.5 mg daily spironolactone.   -Can not take bidil due to hypotension. Refuses to re challenge.    -Plan to repeat ECHO in the next few months after HF meds optimze d If EF remains low on good medical regimen, ICD would be reasonable (nonischemic cardiomyopathy but would lean towards ICD with young age).  He would not be a CRT candidate. Will repeat echo in 1/18.  -Today I provided a weight chart and discussed discuss daily weights,.  2. OSA: Suspected. Discussed sleep study. He declines.   3. HTN: BP improved. Continue current regimen but increase entresto.   4. Hyperlipidemia: He has not been able to tolerate atorvastatin or Crestor.  Intolerant pravastatin but he developed the same feeling of "fuzziness" that he had with the other statins. 5. PVCs/Arrhythmia- - noted in back to 2008 by Dr Eldridge Dace. Also on event monitior May 2017 with PVCs. Continue BB at current dose.    BMET in 10 days.  Follow up in 1 month with Dr Shirlee Latch.   Amy Clegg NP-C  04/14/2016

## 2016-04-14 NOTE — Patient Instructions (Addendum)
Increase Entresto to 49-51mg  (1 Tablet) Two Times daily.    Labs in 10 days  Follow up in 1 month

## 2016-04-16 ENCOUNTER — Ambulatory Visit (HOSPITAL_COMMUNITY): Payer: Medicaid Other

## 2016-04-18 ENCOUNTER — Other Ambulatory Visit (HOSPITAL_COMMUNITY): Payer: Self-pay | Admitting: Cardiology

## 2016-04-18 ENCOUNTER — Ambulatory Visit (HOSPITAL_COMMUNITY): Payer: Medicaid Other

## 2016-04-21 ENCOUNTER — Ambulatory Visit (HOSPITAL_COMMUNITY): Payer: Medicaid Other

## 2016-04-23 ENCOUNTER — Ambulatory Visit (HOSPITAL_COMMUNITY): Payer: Medicaid Other

## 2016-04-23 ENCOUNTER — Inpatient Hospital Stay (HOSPITAL_COMMUNITY): Admission: RE | Admit: 2016-04-23 | Payer: Medicaid Other | Source: Ambulatory Visit

## 2016-04-25 ENCOUNTER — Ambulatory Visit (HOSPITAL_COMMUNITY): Payer: Medicaid Other

## 2016-04-28 ENCOUNTER — Telehealth (HOSPITAL_COMMUNITY): Payer: Self-pay | Admitting: Pharmacist

## 2016-04-28 ENCOUNTER — Ambulatory Visit (HOSPITAL_COMMUNITY): Payer: Medicaid Other

## 2016-04-28 NOTE — Telephone Encounter (Signed)
Entresto 49-51 mg BID PA approved by [place] [employer] through 04/17/17.   Tyler Deis. Bonnye Fava, PharmD, BCPS, CPP Clinical Pharmacist Pager: [phone] Phone: [phone] 04/28/2016 2:40 PM

## 2016-04-30 ENCOUNTER — Ambulatory Visit (HOSPITAL_COMMUNITY): Payer: Medicaid Other

## 2016-05-02 ENCOUNTER — Ambulatory Visit (HOSPITAL_COMMUNITY): Payer: Medicaid Other

## 2016-05-05 ENCOUNTER — Ambulatory Visit (HOSPITAL_COMMUNITY): Payer: Medicaid Other

## 2016-05-07 ENCOUNTER — Ambulatory Visit (HOSPITAL_COMMUNITY): Payer: Medicaid Other

## 2016-05-09 ENCOUNTER — Ambulatory Visit (HOSPITAL_COMMUNITY): Payer: Medicaid Other

## 2016-05-12 ENCOUNTER — Ambulatory Visit (HOSPITAL_COMMUNITY): Payer: Medicaid Other

## 2016-05-14 ENCOUNTER — Ambulatory Visit (HOSPITAL_COMMUNITY): Payer: Medicaid Other

## 2016-05-16 ENCOUNTER — Ambulatory Visit (HOSPITAL_COMMUNITY): Payer: Medicaid Other

## 2016-05-19 ENCOUNTER — Encounter (HOSPITAL_COMMUNITY): Payer: Self-pay

## 2016-05-19 ENCOUNTER — Ambulatory Visit (HOSPITAL_COMMUNITY)
Admission: RE | Admit: 2016-05-19 | Discharge: 2016-05-19 | Disposition: A | Discharge status: Home / Self Care | Payer: Medicaid Other | Source: Ambulatory Visit | Attending: Cardiology | Admitting: Cardiology

## 2016-05-19 ENCOUNTER — Ambulatory Visit (HOSPITAL_COMMUNITY): Payer: Medicaid Other

## 2016-05-19 VITALS — BP 164/90 | HR 90 | Wt 271.8 lb

## 2016-05-19 DIAGNOSIS — G4733 Obstructive sleep apnea (adult) (pediatric): Secondary | ICD-10-CM | POA: Insufficient documentation

## 2016-05-19 DIAGNOSIS — Z794 Long term (current) use of insulin: Secondary | ICD-10-CM | POA: Diagnosis not present

## 2016-05-19 DIAGNOSIS — I110 Hypertensive heart disease with heart failure: Secondary | ICD-10-CM | POA: Diagnosis not present

## 2016-05-19 DIAGNOSIS — I429 Cardiomyopathy, unspecified: Secondary | ICD-10-CM | POA: Insufficient documentation

## 2016-05-19 DIAGNOSIS — E119 Type 2 diabetes mellitus without complications: Secondary | ICD-10-CM | POA: Diagnosis not present

## 2016-05-19 DIAGNOSIS — Z7982 Long term (current) use of aspirin: Secondary | ICD-10-CM | POA: Insufficient documentation

## 2016-05-19 DIAGNOSIS — Z87891 Personal history of nicotine dependence: Secondary | ICD-10-CM | POA: Diagnosis not present

## 2016-05-19 DIAGNOSIS — E785 Hyperlipidemia, unspecified: Secondary | ICD-10-CM | POA: Insufficient documentation

## 2016-05-19 DIAGNOSIS — I10 Essential (primary) hypertension: Secondary | ICD-10-CM

## 2016-05-19 DIAGNOSIS — I5022 Chronic systolic (congestive) heart failure: Secondary | ICD-10-CM | POA: Diagnosis not present

## 2016-05-19 DIAGNOSIS — I428 Other cardiomyopathies: Secondary | ICD-10-CM

## 2016-05-19 LAB — BASIC METABOLIC PANEL
Anion gap: 9 (ref 5–15)
BUN: 5 mg/dL — AB (ref 6–20)
CALCIUM: 9.2 mg/dL (ref 8.9–10.3)
CO2: 26 mmol/L (ref 22–32)
CREATININE: 1.14 mg/dL (ref 0.61–1.24)
Chloride: 103 mmol/L (ref 101–111)
GFR calc Af Amer: >60 mL/min (ref >60)
GLUCOSE: 208 mg/dL — AB (ref 65–99)
Potassium: 4.1 mmol/L (ref 3.5–5.1)
Sodium: 138 mmol/L (ref 135–145)

## 2016-05-19 LAB — BRAIN NATRIURETIC PEPTIDE: B Natriuretic Peptide: 77.4 pg/mL (ref 0.0–100.0)

## 2016-05-19 MED ORDER — SPIRONOLACTONE 25 MG PO TABS: 12.5000 mg | ORAL_TABLET | Freq: Every day | ORAL | 3 refills | Status: DC

## 2016-05-19 NOTE — Progress Notes (Signed)
Medication Samples have been provided to the patient.  Drug name: Sherryll Burger        Strength: 49/51mg         Qty: 2  LOT: [id number]  Exp.Date: 10/19  Dosing instructions: 1 tab Twice daily   The patient has been instructed regarding the correct time, dose, and frequency of taking this medication, including desired effects and most common side effects.   Gerre Ranum 12:46 PM 05/19/2016

## 2016-05-19 NOTE — Patient Instructions (Signed)
Start Spironolactone 12.5 mg (1/2 tab) daily  Start Entresto 49/51 mg Twice daily, [facility] will have in stock tomorrow afternoon  Labs today  Labs in 10 days  Your physician recommends that you schedule a follow-up appointment in: 1 month with echocardiogram

## 2016-05-20 NOTE — Progress Notes (Signed)
PCP: Dr. Concepcion Elk HF Cardiology: Dr. Shirlee Latch  56 yo with history of HTN, diabetes, and nonischemic cardiomyopathy with chronic systolic CHF presents for HF clinic evaluation.  Patient was diagnosed with a cardiomyopathy in around 2008.  He had left heart cath in 2014 with normal coronaries. He was never a heavy drinker and has not used drugs. No family history of cardiomyopathy.  Most recent echo in 5/17 showed EF 25-30%.  He stopped taking atorvastatin, says it made him "feel fuzzy."  I tried him on pravastatin, but this did the same thing and he stopped it.  He was unable to fit in MRI for cardiac MRI.   He felt better after going on Entresto, but is now off it, apparently due to a snafu with his pharmacy. He has also run out of spironolactone.  He is short of breath if he walks fast, worse off Entresto.  OK walking at a slow/steady pace.  No chest pain.  Sleeps on 3 pillows long-term.  No PND.  No lightheadedness.  Only taking Lasix every other day. Weight is stable.  He is very concerned that his cardiomyopathy came from Darvocet.   Labs (6/17): LDL 155, K 4.6, creatinine 1.19 Labs (10/17): K 3.7, creatinine 1.1, BNP 78 Labs (12/17): K 3.6, creatinine 1.15  PMH: 1. Type II diabetes 2. Hyperlipidemia: has not tolerated atorvastatin or pravastatin.  3. HTN 4. OSA: Not using CPAP.  5. OA: Primarily knees.  6. Chronic systolic CHF: Nonischemic cardiomyopathy.   - Cardiolite 2008 with EF 44%, no ischemia.  - LHC (2014): EF 25%, normal coronaries.  - Echo (1/15): EF 25-30%. - Echo (5/17): EF 25-30%, moderate LV dilation, mild MR, PASP 48 mmHg.  7. PVCs: Noted by 30 day monitor, not quantified.  SH: Lives in [place], quit smoking in his 40s, no drugs, no ETOH, used to be a IT trainer.   FH: No cardiomyopathy or other cardiac history that he is aware of.  ROS: All systems reviewed and negative except as per HPI.   Current Outpatient Prescriptions  Medication Sig Dispense Refill  . aspirin  EC 81 MG tablet Take 1 tablet (81 mg total) by mouth daily. 90 tablet 3  . carvedilol (COREG) 12.5 MG tablet Take 1 tablet (12.5 mg total) by mouth 2 (two) times daily. 180 tablet 3  . furosemide (LASIX) 40 MG tablet Take 40 mg by mouth daily.    . Insulin Glargine (LANTUS SOLOSTAR) 100 UNIT/ML SOPN Inject 60 Units into the skin at bedtime.     Marland Kitchen albuterol (PROVENTIL HFA;VENTOLIN HFA) 108 (90 BASE) MCG/ACT inhaler Inhale 2 puffs into the lungs every 4 (four) hours as needed for wheezing or shortness of breath. For wheezing or shortness of breath    . fluticasone (FLONASE) 50 MCG/ACT nasal spray Place 2 sprays into the nose daily as needed for allergies. For seasonal allergies    . sacubitril-valsartan (ENTRESTO) 49-51 MG Take 1 tablet by mouth 2 (two) times daily. (Patient not taking: Reported on 05/19/2016) 60 tablet 3  . spironolactone (ALDACTONE) 25 MG tablet Take 0.5 tablets (12.5 mg total) by mouth daily. 15 tablet 3   No current facility-administered medications for this encounter.    BP (!) 164/90 (BP Location: Left Arm, Patient Position: Sitting, Cuff Size: Normal)   Pulse 90   Wt 271 lb 12.8 oz (123.3 kg)   SpO2 94%   BMI 43.87 kg/m  General: NAD Neck: JVP 8-9 cm, no thyromegaly or thyroid nodule.  Lungs: Clear to  auscultation bilaterally with normal respiratory effort. CV: Nondisplaced PMI.  Heart regular S1/S2, no S3/S4, no murmur.  No edema.  No carotid bruit.  Normal pedal pulses.  Abdomen: Soft, nontender, no hepatosplenomegaly, no distention.  Skin: Intact without lesions or rashes.  Neurologic: Alert and oriented x 3.  Psych: Normal affect. Extremities: No clubbing or cyanosis.  HEENT: Normal.   Assessment/Plan: 1. Chronic systolic CHF: Nonischemic cardiomyopathy.  No family history of cardiomyopathy, no heavy ETOH or drugs.  Last echo in 5/17 with EF 25-30%.  He was unable to fit for cardiac MRI.  EF has been low since at least 2008.  NYHA class II-III symptoms.  Increase  dyspnea since stopping Entresto, NYHA class II-III with mild volume overload. - Continue Lasix 40 mg every other day.  Restart Entresto 49/51 bid. This should help with volume.  BMET/BNP today and again in 10 days.  - Restart spironolactone 12.5 daily.  - Continue current Coreg.  - Lightheaded with Bidil, has not wanted to re-try.  - If EF remains low on good medical regimen, ICD would be reasonable (nonischemic cardiomyopathy but would lean towards ICD with young age).  He would not be a CRT candidate. Will repeat echo in at followup in 1 month.  2. OSA: Suspected. Does not want sleep study at this time.  3. HTN: BP high but has been off Entresto and spironolactone.   4. Hyperlipidemia: He has not been able to tolerate atorvastatin or Crestor.  I tried him on pravastatin but he developed the same feeling of "fuzziness" that he had with the other statins.  Followup in 1 month.   Marca Ancona 05/20/2016

## 2016-05-21 ENCOUNTER — Ambulatory Visit (HOSPITAL_COMMUNITY): Payer: Medicaid Other

## 2016-05-23 ENCOUNTER — Ambulatory Visit (HOSPITAL_COMMUNITY): Payer: Medicaid Other

## 2016-05-26 ENCOUNTER — Ambulatory Visit (HOSPITAL_COMMUNITY): Payer: Medicaid Other

## 2016-05-28 ENCOUNTER — Ambulatory Visit (HOSPITAL_COMMUNITY): Payer: Medicaid Other

## 2016-05-29 ENCOUNTER — Inpatient Hospital Stay (HOSPITAL_COMMUNITY): Admission: RE | Admit: 2016-05-29 | Payer: Medicaid Other | Source: Ambulatory Visit

## 2016-05-30 ENCOUNTER — Ambulatory Visit (HOSPITAL_COMMUNITY): Payer: Medicaid Other

## 2016-05-30 ENCOUNTER — Ambulatory Visit (HOSPITAL_COMMUNITY)
Admission: RE | Admit: 2016-05-30 | Discharge: 2016-05-30 | Disposition: A | Discharge status: Home / Self Care | Payer: Medicaid Other | Source: Ambulatory Visit | Attending: Cardiology | Admitting: Cardiology

## 2016-05-30 DIAGNOSIS — I428 Other cardiomyopathies: Secondary | ICD-10-CM | POA: Insufficient documentation

## 2016-05-30 LAB — BASIC METABOLIC PANEL
Anion gap: 9 (ref 5–15)
BUN: 5 mg/dL — ABNORMAL LOW (ref 6–20)
CHLORIDE: 103 mmol/L (ref 101–111)
CO2: 25 mmol/L (ref 22–32)
CREATININE: 1.09 mg/dL (ref 0.61–1.24)
Calcium: 9.2 mg/dL (ref 8.9–10.3)
GFR calc Af Amer: >60 mL/min (ref >60)
GFR calc non Af Amer: >60 mL/min (ref >60)
GLUCOSE: 237 mg/dL — AB (ref 65–99)
Potassium: 3.8 mmol/L (ref 3.5–5.1)
Sodium: 137 mmol/L (ref 135–145)

## 2016-06-02 ENCOUNTER — Ambulatory Visit (HOSPITAL_COMMUNITY): Payer: Medicaid Other

## 2016-06-04 ENCOUNTER — Ambulatory Visit (HOSPITAL_COMMUNITY): Payer: Medicaid Other

## 2016-06-06 ENCOUNTER — Ambulatory Visit (HOSPITAL_COMMUNITY): Payer: Medicaid Other

## 2016-06-09 ENCOUNTER — Ambulatory Visit (HOSPITAL_COMMUNITY): Payer: Medicaid Other

## 2016-06-09 ENCOUNTER — Telehealth (HOSPITAL_COMMUNITY): Payer: Self-pay | Admitting: Internal Medicine

## 2016-06-09 NOTE — Telephone Encounter (Signed)
S/w pt, he was scheduled back in January, due to other health issues pt can't do this program at this time but will c/b if able to.... KJ

## 2016-06-11 ENCOUNTER — Ambulatory Visit (HOSPITAL_COMMUNITY): Payer: Medicaid Other

## 2016-06-13 ENCOUNTER — Ambulatory Visit (HOSPITAL_COMMUNITY): Payer: Medicaid Other

## 2016-06-16 ENCOUNTER — Ambulatory Visit (HOSPITAL_COMMUNITY): Payer: Medicaid Other

## 2016-06-18 ENCOUNTER — Ambulatory Visit (HOSPITAL_COMMUNITY): Payer: Medicaid Other

## 2016-06-20 ENCOUNTER — Ambulatory Visit (HOSPITAL_COMMUNITY): Payer: Medicaid Other

## 2016-06-23 ENCOUNTER — Ambulatory Visit (HOSPITAL_COMMUNITY): Payer: Medicaid Other

## 2016-06-25 ENCOUNTER — Ambulatory Visit (HOSPITAL_COMMUNITY): Payer: Medicaid Other

## 2016-06-27 ENCOUNTER — Ambulatory Visit (HOSPITAL_BASED_OUTPATIENT_CLINIC_OR_DEPARTMENT_OTHER)
Admission: RE | Admit: 2016-06-27 | Discharge: 2016-06-27 | Disposition: A | Discharge status: Home / Self Care | Payer: Medicaid Other | Source: Ambulatory Visit | Attending: Cardiology | Admitting: Cardiology

## 2016-06-27 ENCOUNTER — Encounter (HOSPITAL_COMMUNITY): Payer: Self-pay

## 2016-06-27 ENCOUNTER — Ambulatory Visit (HOSPITAL_COMMUNITY): Payer: Medicaid Other

## 2016-06-27 ENCOUNTER — Ambulatory Visit (HOSPITAL_COMMUNITY)
Admission: RE | Admit: 2016-06-27 | Discharge: 2016-06-27 | Disposition: A | Discharge status: Home / Self Care | Payer: Medicaid Other | Source: Ambulatory Visit | Attending: Cardiology | Admitting: Cardiology

## 2016-06-27 VITALS — BP 152/82 | HR 63 | Wt 266.5 lb

## 2016-06-27 DIAGNOSIS — I10 Essential (primary) hypertension: Secondary | ICD-10-CM | POA: Diagnosis not present

## 2016-06-27 DIAGNOSIS — I5022 Chronic systolic (congestive) heart failure: Secondary | ICD-10-CM

## 2016-06-27 DIAGNOSIS — I071 Rheumatic tricuspid insufficiency: Secondary | ICD-10-CM | POA: Insufficient documentation

## 2016-06-27 DIAGNOSIS — I428 Other cardiomyopathies: Secondary | ICD-10-CM

## 2016-06-27 DIAGNOSIS — I340 Nonrheumatic mitral (valve) insufficiency: Secondary | ICD-10-CM | POA: Insufficient documentation

## 2016-06-27 DIAGNOSIS — I509 Heart failure, unspecified: Secondary | ICD-10-CM | POA: Insufficient documentation

## 2016-06-27 DIAGNOSIS — I272 Pulmonary hypertension, unspecified: Secondary | ICD-10-CM | POA: Insufficient documentation

## 2016-06-27 LAB — ECHOCARDIOGRAM COMPLETE
AVLVOTPG: 4 mmHg
CHL CUP DOP CALC LVOT VTI: 21 cm
CHL CUP MV DEC (S): 173
E/e' ratio: 22.34
EWDT: 173 ms
FS: 17 % — AB (ref 28–44)
IV/PV OW: 0.8
LA vol index: 28.8 mL/m2
LA vol: 70.8 mL
LADIAMINDEX: 1.63 cm/m2
LASIZE: 40 mm
LAVOLA4C: 66.8 mL
LEFT ATRIUM END SYS DIAM: 40 mm
LV PW d: 10.8 mm — AB (ref 0.6–1.1)
LV e' LATERAL: 5.55 cm/s
LVEEAVG: 22.34
LVEEMED: 22.34
LVOT SV: 73 mL
LVOT area: 3.46 cm2
LVOT peak vel: 101 cm/s
LVOTD: 21 mm
Lateral S' vel: 10.9 cm/s
MV Peak grad: 6 mmHg
MVPKAVEL: 64.9 m/s
MVPKEVEL: 124 m/s
TAPSE: 20.4 mm
TDI e' lateral: 5.55
TDI e' medial: 5.22

## 2016-06-27 MED ORDER — SPIRONOLACTONE 25 MG PO TABS: 12.5000 mg | ORAL_TABLET | Freq: Every day | ORAL | 6 refills | Status: DC

## 2016-06-27 MED ORDER — SACUBITRIL-VALSARTAN 24-26 MG PO TABS: 1.0000 | ORAL_TABLET | Freq: Two times a day (BID) | ORAL | 6 refills | Status: DC

## 2016-06-27 NOTE — Progress Notes (Signed)
  Echocardiogram 2D Echocardiogram has been performed.  Delcie Roch 06/27/2016, 9:41 AM

## 2016-06-27 NOTE — Patient Instructions (Signed)
START Entresto 24/26 mg tablet twice daily.  START Spironolactone 12.5 mg (1/2 tablet) once daily.  Return in 1-2 weeks for lab work.  ___________________________________________________  ___________________________________________________  Follow up 1 month.  ____________________________________________________  ____________________________________________________  Do the following things EVERYDAY: 1) Weigh yourself in the morning before breakfast. Write it down and keep it in a log. 2) Take your medicines as prescribed 3) Eat low salt foods-Limit salt (sodium) to 2000 mg per day.  4) Stay as active as you can everyday 5) Limit all fluids for the day to less than 2 liters

## 2016-06-28 NOTE — Progress Notes (Signed)
PCP: Dr. Concepcion Elk HF Cardiology: Dr. Shirlee Latch  56 yo with history of HTN, diabetes, and nonischemic cardiomyopathy with chronic systolic CHF presents for HF clinic evaluation.  Patient was diagnosed with a cardiomyopathy in around 2008.  He had left heart cath in 2014 with normal coronaries. He was never a heavy drinker and has not used drugs. No family history of cardiomyopathy.  He is very concerned that his cardiomyopathy came from Darvocet. Echo in 5/17 showed EF 25-30%, echo done today shows EF 25% with diffuse hypokinesis. He was unable to fit in MRI for cardiac MRI.   He is not taking spironolactone or Entresto.  He was dizzy on the 49/51 bid dose of Entresto but just stopped it rather than going back to the lower dose.  He is not sure why he is no longer taking spironolactone.  Weight is down 5 lbs.  Feels "pretty good" overall.  Short of breath walking fast, ok if he paces himself.  He can climb a flight of steps without much trouble.  No orthopnea/PND.  No chest pain.    Labs (6/17): LDL 155, K 4.6, creatinine 1.19 Labs (10/17): K 3.7, creatinine 1.1, BNP 78 Labs (12/17): K 3.6, creatinine 1.15 Labs (3/18): K 3.8, creatinine 1.09  PMH: 1. Type II diabetes 2. Hyperlipidemia: has not tolerated atorvastatin or pravastatin.  3. HTN 4. OSA: Not using CPAP.  5. OA: Primarily knees.  6. Chronic systolic CHF: Nonischemic cardiomyopathy.   - Cardiolite 2008 with EF 44%, no ischemia.  - LHC (2014): EF 25%, normal coronaries.  - Echo (1/15): EF 25-30%. - Echo (5/17): EF 25-30%, moderate LV dilation, mild MR, PASP 48 mmHg.  - Echo (3/18): EF 25%, diffuse hypokinesis, normal RV size and systolic function.  7. PVCs: Noted by 30 day monitor, not quantified.  SH: Lives in [place], quit smoking in his 40s, no drugs, no ETOH, used to be a IT trainer.   FH: No cardiomyopathy or other cardiac history that he is aware of.  ROS: All systems reviewed and negative except as per HPI.   Current  Outpatient Prescriptions  Medication Sig Dispense Refill  . albuterol (PROVENTIL HFA;VENTOLIN HFA) 108 (90 BASE) MCG/ACT inhaler Inhale 2 puffs into the lungs every 4 (four) hours as needed for wheezing or shortness of breath. For wheezing or shortness of breath    . aspirin EC 81 MG tablet Take 1 tablet (81 mg total) by mouth daily. 90 tablet 3  . carvedilol (COREG) 12.5 MG tablet Take 1 tablet (12.5 mg total) by mouth 2 (two) times daily. 180 tablet 3  . fluticasone (FLONASE) 50 MCG/ACT nasal spray Place 2 sprays into the nose daily as needed for allergies. For seasonal allergies    . furosemide (LASIX) 40 MG tablet Take 40 mg by mouth daily.    . Insulin Glargine (LANTUS SOLOSTAR) 100 UNIT/ML SOPN Inject 60 Units into the skin at bedtime.     . sacubitril-valsartan (ENTRESTO) 24-26 MG Take 1 tablet by mouth 2 (two) times daily. 60 tablet 6  . spironolactone (ALDACTONE) 25 MG tablet Take 0.5 tablets (12.5 mg total) by mouth daily. 15 tablet 6   No current facility-administered medications for this encounter.    BP (!) 152/82   Pulse 63   Wt 266 lb 8 oz (120.9 kg)   SpO2 100%   BMI 43.01 kg/m  General: NAD Neck: JVP 8 cm, no thyromegaly or thyroid nodule.  Lungs: Clear to auscultation bilaterally with normal respiratory effort. CV: Nondisplaced  PMI.  Heart regular S1/S2, no S3/S4, no murmur.  1+ ankle edema.  No carotid bruit.  Normal pedal pulses.  Abdomen: Soft, nontender, no hepatosplenomegaly, no distention.  Skin: Intact without lesions or rashes.  Neurologic: Alert and oriented x 3.  Psych: Normal affect. Extremities: No clubbing or cyanosis.  HEENT: Normal.   Assessment/Plan: 1. Chronic systolic CHF: Nonischemic cardiomyopathy.  No family history of cardiomyopathy, no heavy ETOH or drugs.  Echo today was reviewed, EF remains 25%.  He was unable to fit for cardiac MRI.  EF has been low since at least 2008.  NYHA class II-III symptoms.  He is very mildly volume overloaded.  He is  not taking Entresto or spironolactone.  - Would hold off on ICD yet, he has not been compliant with a good medical regimen and has a nonischemic cardiomyopathy.  Will work on getting him to stay on meds, then repeat echo in a few months with ICD given young age if EF remains low.  Not CRT candidate.  - Continue Lasix 40 mg daily.  - Restart Entresto at 24/26 bid.  BMET/BNP in 10 days.  I will not increase Lasix as he will get some diuresis with Entresto.  - Restart spironolactone 12.5 daily.  - Continue current Coreg.  - Lightheaded with Bidil, has not wanted to re-try.  2. OSA: Suspected. Does not want sleep study at this time.  3. HTN: BP high but has been off Entresto and spironolactone, restart.   4. Hyperlipidemia: He has not been able to tolerate atorvastatin or Crestor.  I tried him on pravastatin but he developed the same feeling of "fuzziness" that he had with the other statins.  Followup in 1 month with PA/NP.   Marca Ancona 06/28/2016

## 2016-06-30 ENCOUNTER — Ambulatory Visit (HOSPITAL_COMMUNITY): Payer: Medicaid Other

## 2016-07-02 ENCOUNTER — Ambulatory Visit (HOSPITAL_COMMUNITY): Payer: Medicaid Other

## 2016-07-04 ENCOUNTER — Ambulatory Visit (HOSPITAL_COMMUNITY): Payer: Medicaid Other

## 2016-07-11 ENCOUNTER — Ambulatory Visit (HOSPITAL_COMMUNITY)
Admission: RE | Admit: 2016-07-11 | Discharge: 2016-07-11 | Disposition: A | Discharge status: Home / Self Care | Payer: Medicaid Other | Source: Ambulatory Visit | Attending: Cardiology | Admitting: Cardiology

## 2016-07-11 DIAGNOSIS — I428 Other cardiomyopathies: Secondary | ICD-10-CM | POA: Diagnosis not present

## 2016-07-11 LAB — BASIC METABOLIC PANEL
Anion gap: 8 (ref 5–15)
BUN: 6 mg/dL (ref 6–20)
CHLORIDE: 105 mmol/L (ref 101–111)
CO2: 23 mmol/L (ref 22–32)
CREATININE: 0.98 mg/dL (ref 0.61–1.24)
Calcium: 8.8 mg/dL — ABNORMAL LOW (ref 8.9–10.3)
GFR calc non Af Amer: >60 mL/min (ref >60)
Glucose, Bld: 231 mg/dL — ABNORMAL HIGH (ref 65–99)
POTASSIUM: 3.9 mmol/L (ref 3.5–5.1)
SODIUM: 136 mmol/L (ref 135–145)

## 2016-07-11 LAB — BRAIN NATRIURETIC PEPTIDE: B NATRIURETIC PEPTIDE 5: 80.5 pg/mL (ref 0.0–100.0)

## 2016-07-28 ENCOUNTER — Ambulatory Visit (HOSPITAL_COMMUNITY)
Admission: RE | Admit: 2016-07-28 | Discharge: 2016-07-28 | Disposition: A | Discharge status: Home / Self Care | Payer: Medicaid Other | Source: Ambulatory Visit | Attending: Cardiology | Admitting: Cardiology

## 2016-07-28 VITALS — BP 134/74 | HR 84 | Wt 266.0 lb

## 2016-07-28 DIAGNOSIS — I10 Essential (primary) hypertension: Secondary | ICD-10-CM

## 2016-07-28 DIAGNOSIS — Z794 Long term (current) use of insulin: Secondary | ICD-10-CM | POA: Insufficient documentation

## 2016-07-28 DIAGNOSIS — Z87891 Personal history of nicotine dependence: Secondary | ICD-10-CM | POA: Insufficient documentation

## 2016-07-28 DIAGNOSIS — I429 Cardiomyopathy, unspecified: Secondary | ICD-10-CM | POA: Insufficient documentation

## 2016-07-28 DIAGNOSIS — Z7982 Long term (current) use of aspirin: Secondary | ICD-10-CM | POA: Insufficient documentation

## 2016-07-28 DIAGNOSIS — I428 Other cardiomyopathies: Secondary | ICD-10-CM

## 2016-07-28 DIAGNOSIS — E119 Type 2 diabetes mellitus without complications: Secondary | ICD-10-CM | POA: Insufficient documentation

## 2016-07-28 DIAGNOSIS — I110 Hypertensive heart disease with heart failure: Secondary | ICD-10-CM | POA: Diagnosis not present

## 2016-07-28 DIAGNOSIS — E785 Hyperlipidemia, unspecified: Secondary | ICD-10-CM | POA: Diagnosis not present

## 2016-07-28 DIAGNOSIS — E669 Obesity, unspecified: Secondary | ICD-10-CM

## 2016-07-28 DIAGNOSIS — I5022 Chronic systolic (congestive) heart failure: Secondary | ICD-10-CM | POA: Diagnosis not present

## 2016-07-28 DIAGNOSIS — G4733 Obstructive sleep apnea (adult) (pediatric): Secondary | ICD-10-CM | POA: Insufficient documentation

## 2016-07-28 MED ORDER — CARVEDILOL 12.5 MG PO TABS: 18.7500 mg | ORAL_TABLET | Freq: Two times a day (BID) | ORAL | 3 refills | Status: DC

## 2016-07-28 NOTE — Patient Instructions (Signed)
INCREASE Carvedilol (Coreg) to 18.75 mg (1.5 tabs) twice daily.  No labs today.  Follow up with CHF clinical pharmacist Elizabeth Palau in 3 weeks.  Follow up with Otilio Saber PA-C in 2 months.  Do the following things EVERYDAY: 1) Weigh yourself in the morning before breakfast. Write it down and keep it in a log. 2) Take your medicines as prescribed 3) Eat low salt foods-Limit salt (sodium) to 2000 mg per day.  4) Stay as active as you can everyday 5) Limit all fluids for the day to less than 2 liters

## 2016-07-28 NOTE — Progress Notes (Signed)
Advanced Heart Failure Clinic Note   PCP: Dr. Concepcion Elk HF Cardiology: Dr. Derrek Gu Wichern is a 56 y.o. male with history of HTN, diabetes, and nonischemic cardiomyopathy with chronic systolic CHF presents for HF clinic evaluation.  Patient was diagnosed with a cardiomyopathy in around 2008.  He had left heart cath in 2014 with normal coronaries. He was never a heavy drinker and has not used drugs. No family history of cardiomyopathy.  He is very concerned that his cardiomyopathy came from Darvocet. Echo in 5/17 showed EF 25-30%, echo done today shows EF 25% with diffuse hypokinesis. He was unable to fit in MRI for cardiac MRI.   He presents today for regular follow up. Last visit started back on spiro and Entresto. Has been feeling better. Breathing has been good. Can walk 0.5 - 1 block without SOB. Walking at pace he can walk on flat ground with minimal difficulty. More SOB if he gets in a hurry.  Denies SOB with ADLs. + Bendopnea.  Chronic 3 pillow orthopnea.  Has mild "chest tightness" occasionally with SOB. Denies lightheadedness or dizziness. Taking all medications as directed.    Labs (6/17): LDL 155, K 4.6, creatinine 1.19 Labs (10/17): K 3.7, creatinine 1.1, BNP 78 Labs (12/17): K 3.6, creatinine 1.15 Labs (3/18): K 3.8, creatinine 1.09  PMH: 1. Type II diabetes 2. Hyperlipidemia: has not tolerated atorvastatin or pravastatin.  3. HTN 4. OSA: Not using CPAP.  5. OA: Primarily knees.  6. Chronic systolic CHF: Nonischemic cardiomyopathy.   - Cardiolite 2008 with EF 44%, no ischemia.  - LHC (2014): EF 25%, normal coronaries.  - Echo (1/15): EF 25-30%. - Echo (5/17): EF 25-30%, moderate LV dilation, mild MR, PASP 48 mmHg.  - Echo (3/18): EF 25%, diffuse hypokinesis, normal RV size and systolic function.  7. PVCs: Noted by 30 day monitor, not quantified.  SH: Lives in [place], quit smoking in his 40s, no drugs, no ETOH, used to be a IT trainer.   FH: No cardiomyopathy or  other cardiac history that he is aware of.   Review of systems complete and found to be negative unless listed in HPI.    Current Outpatient Prescriptions  Medication Sig Dispense Refill  . albuterol (PROVENTIL HFA;VENTOLIN HFA) 108 (90 BASE) MCG/ACT inhaler Inhale 2 puffs into the lungs every 4 (four) hours as needed for wheezing or shortness of breath. For wheezing or shortness of breath    . aspirin EC 81 MG tablet Take 1 tablet (81 mg total) by mouth daily. 90 tablet 3  . carvedilol (COREG) 12.5 MG tablet Take 1 tablet (12.5 mg total) by mouth 2 (two) times daily. 180 tablet 3  . fluticasone (FLONASE) 50 MCG/ACT nasal spray Place 2 sprays into the nose daily as needed for allergies. For seasonal allergies    . furosemide (LASIX) 40 MG tablet Take 40 mg by mouth daily.    . Insulin Glargine (LANTUS SOLOSTAR) 100 UNIT/ML SOPN Inject 60 Units into the skin at bedtime.     . sacubitril-valsartan (ENTRESTO) 24-26 MG Take 1 tablet by mouth 2 (two) times daily. 60 tablet 6  . spironolactone (ALDACTONE) 25 MG tablet Take 0.5 tablets (12.5 mg total) by mouth daily. 15 tablet 6   No current facility-administered medications for this encounter.    Vitals:   07/28/16 1341  BP: 134/74  Pulse: 84  SpO2: 94%  Weight: 266 lb (120.7 kg)   Wt Readings from Last 3 Encounters:  07/28/16 266 lb (120.7 kg)  06/27/16 266 lb 8 oz (120.9 kg)  05/19/16 271 lb 12.8 oz (123.3 kg)    General: Well appearing. No resp difficulty. HEENT: normal Neck: supple. JVD 7-8. Carotids 2+ bilat; no bruits. No thyromegaly or nodule noted. Cor: PMI nondisplaced. RRR, No M/G/R noted Lungs: CTAB, normal effort. Abdomen: soft, non-tender, distended, no HSM. No bruits or masses. +BS  Extremities: no cyanosis, clubbing, rash, R and LLE no edema.  Neuro: alert & orientedx3, cranial nerves grossly intact. moves all 4 extremities w/o difficulty. Affect pleasant     Assessment/Plan: 1. Chronic systolic CHF: Nonischemic  cardiomyopathy.  No family history of cardiomyopathy, no heavy ETOH or drugs.  Echo today was reviewed, EF remains 25%.  He was unable to fit for cardiac MRI.  EF has been low since at least 2008.  - NYHA III symptoms.  - Volume status looks OK on exam. Continue lasix 40 mg daily.  - Encouraged medication compliance and plans for repeat Echo later this year.  - Continue Lasix 40 mg daily.  - Continue Entresto at 24/26 bid.  Recent BMET stable.  - Continue spironolactone 12.5 daily.  - Increase coreg 18.75 mg BID  - Lightheaded with Bidil, and refuses re-challenge.  - Reinforced fluid restriction to < 2 L daily, sodium restriction to less than 2000 mg daily, and the importance of daily weights.   2. OSA:  - Suspected. Refused sleep study.  3. HTN:  - Meds as above.  4. Hyperlipidemia:  - Intolerant to statins with "fuzziness" in his head. Refuses to rechallenge.   Follow up 4 weeks with Pharm-D for med-titration.  Follow up 2 months for provider visit.   Graciella Freer, PA-C  07/28/2016  Greater than 50% of the 25 minute visit was spent in counseling/coordination of care regarding disease state educations, fluid/salt restriction, sliding scale diuretics, and importance of medication compliance.

## 2016-08-18 ENCOUNTER — Ambulatory Visit (HOSPITAL_COMMUNITY)
Admission: RE | Admit: 2016-08-18 | Discharge: 2016-08-18 | Disposition: A | Discharge status: Home / Self Care | Payer: Medicaid Other | Source: Ambulatory Visit | Attending: Cardiology | Admitting: Cardiology

## 2016-08-18 ENCOUNTER — Encounter (HOSPITAL_COMMUNITY): Payer: Self-pay

## 2016-08-18 VITALS — BP 142/78 | HR 58 | Wt 272.0 lb

## 2016-08-18 DIAGNOSIS — I110 Hypertensive heart disease with heart failure: Secondary | ICD-10-CM | POA: Insufficient documentation

## 2016-08-18 DIAGNOSIS — I5022 Chronic systolic (congestive) heart failure: Secondary | ICD-10-CM | POA: Insufficient documentation

## 2016-08-18 DIAGNOSIS — Z79899 Other long term (current) drug therapy: Secondary | ICD-10-CM | POA: Insufficient documentation

## 2016-08-18 DIAGNOSIS — E785 Hyperlipidemia, unspecified: Secondary | ICD-10-CM | POA: Diagnosis not present

## 2016-08-18 DIAGNOSIS — E119 Type 2 diabetes mellitus without complications: Secondary | ICD-10-CM | POA: Insufficient documentation

## 2016-08-18 DIAGNOSIS — I255 Ischemic cardiomyopathy: Secondary | ICD-10-CM | POA: Insufficient documentation

## 2016-08-18 DIAGNOSIS — I428 Other cardiomyopathies: Secondary | ICD-10-CM

## 2016-08-18 MED ORDER — SPIRONOLACTONE 25 MG PO TABS
25.0000 mg | ORAL_TABLET | Freq: Every day | ORAL | 3 refills | Status: DC
Start: 2016-08-18 — End: 2016-12-02

## 2016-08-18 MED ORDER — SACUBITRIL-VALSARTAN 24-26 MG PO TABS: 1.0000 | ORAL_TABLET | Freq: Two times a day (BID) | ORAL | 6 refills | Status: DC

## 2016-08-18 NOTE — Progress Notes (Signed)
HF MD: [facility] HPI: Kevin Long is a 56 y.o. AA male with history of HTN, diabetes, and nonischemic cardiomyopathy with chronic systolic CHF presents for HF clinic evaluation.  Patient was diagnosed with a cardiomyopathy in around 2008.  He had left heart cath in 2014 with normal coronaries. He was never a heavy drinker and has not used drugs. No family history of cardiomyopathy.  He is very concerned that his cardiomyopathy came from Darvocet. Echo in 5/17 showed EF 25-30%, echo done today shows EF 25% with diffuse hypokinesis. He was unable to fit in MRI for cardiac MRI.   He presents today for pharmacist lead HF medication titration. At last visit with Mardelle Matte on 4/30, Coreg dose was increased to 18.75mg  BID. Pt reports that he is doing well since this but has been a little more tired than usual. This has since improved. Overall, no complaints. He states that he walks about a block without any SOB. Does occasionally have "chest tightness" and increased SOB while sleeping. However, when this happens he takes his lasix 40mg  daily and it improves. Of note, pt previously was only taking lasix 40mg  prn. In addition, also discovered that he was only taking Entresto daily instead of BID. Chronic 3 pillow orthopnea. Denies lightheadedness or dizziness.  . Shortness of breath/dyspnea on exertion? No . Orthopnea/PND? Yes - he could tell that he had additional SOB while laying flat last week, he takes lasix prn and after taking the medication this went away. He has chronic 3 pillow orthopnea. . Edema? No  . Lightheadedness/dizziness? No  . Daily weights at home? Yes - [id number] . Blood pressure/heart rate monitoring at home? Yes - 120-130s/mid70s . Following low-sodium/fluid-restricted diet? Yes - does not put salt on his food, watches what foods he eats when he does eat out.  HF Medications: Spironolactone 12.5mg  Carvedilol 18.75mg  BID Entresto 24-26mg  DAILY Lasix 40mg  daily - pt is taking prn but has been  taking daily for the last 2 weeks.  Has the patient been experiencing any side effects to the medications prescribed?  No   Does the patient have any problems obtaining medications due to transportation or finances?   No   Understanding of regimen: good Understanding of indications: good Potential of compliance: good Patient understands to avoid NSAIDs. Patient understands to avoid decongestants.    Pertinent Lab Values: . 4/13: Serum creatinine 0.98, CO2 23, Potassium 3.9, Sodium 136, BNP 80.5  Vital Signs: . Weight: 272lb (dry weight: 265lb) . Blood pressure: 142/15mmHg  . Heart rate: 58 bpm   Assessment: 1. Chronicsystolic CHF (EF 82% on ECHO 06/27/16), due to NICM. NYHA class IIsymptoms. - Volume status slightly up on exam. With reports of chest tightness and increased orthopnea, instructed pt to take lasix 40mg  daily and not prn. - Increase Entresto to 24/26 bid, instead of daily and increase spironolactone to 25mg  daily. BMET in one week. - Continue Coreg 18.75 mg BID  - Lightheaded with Bidil, and refuses re-challenge.  - Reinforced fluid restriction to < 2 L daily, sodium restriction to less than 2000 mg daily, and the importance of daily weights.   - Basic disease state pathophysiology, medication indication, mechanism and side effects reviewed at length with patient and he verbalized understanding 2. OSA:  - Suspected. Refused sleep study.  3. HTN:  - Still above goal <130/80 mmHg - Increase Entresto and spironolactone as above  4. Hyperlipidemia:  - Intolerant to statins with "fuzziness" in his head. Refuses to rechallenge.  Plan: 1) Medication changes: Based on clinical presentation, vital signs and recent labs will increase spironolactone to 25mg  daily, increase Entresto to BID from daily, continue/resume lasix 40mg  daily instead of prn, and continue Coreg 18.75mg  BID. 2) Labs: BMET May 30th 3) Follow-up: APP clinic on 09/25/2016  Leone Haven, PharmD,  BCPS PGY2 Pharmacy Resident   Tyler Deis. Bonnye Fava, PharmD, BCPS, CPP Clinical Pharmacist Pager: [phone] Phone: [phone] 08/18/2016 2:05 PM

## 2016-08-18 NOTE — Patient Instructions (Addendum)
It was great to see you today.  Please increase your spironolactone to 25mg  daily (1 full tablet).  Please increase your Entresto 24/26mg  to 1 tablet twice a day.  Please come in on May 30th at 2:00PM to get your lab work done, we will call you if there are any issues  Your follow up appointment is scheduled for 09/25/2016.

## 2016-08-27 ENCOUNTER — Ambulatory Visit (HOSPITAL_COMMUNITY)
Admission: RE | Admit: 2016-08-27 | Discharge: 2016-08-27 | Disposition: A | Discharge status: Home / Self Care | Payer: Medicaid Other | Source: Ambulatory Visit | Attending: Internal Medicine | Admitting: Internal Medicine

## 2016-08-27 DIAGNOSIS — I428 Other cardiomyopathies: Secondary | ICD-10-CM

## 2016-08-27 LAB — BASIC METABOLIC PANEL
ANION GAP: 7 (ref 5–15)
BUN: 5 mg/dL — AB (ref 6–20)
CHLORIDE: 104 mmol/L (ref 101–111)
CO2: 26 mmol/L (ref 22–32)
Calcium: 8.9 mg/dL (ref 8.9–10.3)
Creatinine, Ser: 1.03 mg/dL (ref 0.61–1.24)
GFR calc Af Amer: >60 mL/min (ref >60)
GLUCOSE: 193 mg/dL — AB (ref 65–99)
POTASSIUM: 3.8 mmol/L (ref 3.5–5.1)
Sodium: 137 mmol/L (ref 135–145)

## 2016-09-25 ENCOUNTER — Ambulatory Visit (HOSPITAL_COMMUNITY)
Admission: RE | Admit: 2016-09-25 | Discharge: 2016-09-25 | Disposition: A | Discharge status: Home / Self Care | Payer: Medicaid Other | Source: Ambulatory Visit | Attending: Cardiology | Admitting: Cardiology

## 2016-09-25 ENCOUNTER — Encounter (HOSPITAL_COMMUNITY): Payer: Self-pay

## 2016-09-25 VITALS — BP 152/86 | HR 80 | Wt 265.8 lb

## 2016-09-25 DIAGNOSIS — E785 Hyperlipidemia, unspecified: Secondary | ICD-10-CM | POA: Diagnosis not present

## 2016-09-25 DIAGNOSIS — Z794 Long term (current) use of insulin: Secondary | ICD-10-CM | POA: Diagnosis not present

## 2016-09-25 DIAGNOSIS — I10 Essential (primary) hypertension: Secondary | ICD-10-CM | POA: Diagnosis not present

## 2016-09-25 DIAGNOSIS — E119 Type 2 diabetes mellitus without complications: Secondary | ICD-10-CM | POA: Insufficient documentation

## 2016-09-25 DIAGNOSIS — I110 Hypertensive heart disease with heart failure: Secondary | ICD-10-CM | POA: Diagnosis not present

## 2016-09-25 DIAGNOSIS — G4733 Obstructive sleep apnea (adult) (pediatric): Secondary | ICD-10-CM | POA: Diagnosis not present

## 2016-09-25 DIAGNOSIS — E6609 Other obesity due to excess calories: Secondary | ICD-10-CM | POA: Diagnosis not present

## 2016-09-25 DIAGNOSIS — H8110 Benign paroxysmal vertigo, unspecified ear: Secondary | ICD-10-CM | POA: Diagnosis not present

## 2016-09-25 DIAGNOSIS — R42 Dizziness and giddiness: Secondary | ICD-10-CM

## 2016-09-25 DIAGNOSIS — I428 Other cardiomyopathies: Secondary | ICD-10-CM

## 2016-09-25 DIAGNOSIS — Z7982 Long term (current) use of aspirin: Secondary | ICD-10-CM | POA: Insufficient documentation

## 2016-09-25 DIAGNOSIS — Z79899 Other long term (current) drug therapy: Secondary | ICD-10-CM | POA: Diagnosis not present

## 2016-09-25 DIAGNOSIS — Z87891 Personal history of nicotine dependence: Secondary | ICD-10-CM | POA: Insufficient documentation

## 2016-09-25 DIAGNOSIS — I5043 Acute on chronic combined systolic (congestive) and diastolic (congestive) heart failure: Secondary | ICD-10-CM | POA: Insufficient documentation

## 2016-09-25 DIAGNOSIS — I5020 Unspecified systolic (congestive) heart failure: Secondary | ICD-10-CM

## 2016-09-25 DIAGNOSIS — I5022 Chronic systolic (congestive) heart failure: Secondary | ICD-10-CM | POA: Diagnosis not present

## 2016-09-25 DIAGNOSIS — I5042 Chronic combined systolic (congestive) and diastolic (congestive) heart failure: Secondary | ICD-10-CM | POA: Insufficient documentation

## 2016-09-25 HISTORY — DX: Unspecified systolic (congestive) heart failure: I50.20

## 2016-09-25 HISTORY — DX: Dizziness and giddiness: R42

## 2016-09-25 LAB — BASIC METABOLIC PANEL
ANION GAP: 9 (ref 5–15)
BUN: 5 mg/dL — ABNORMAL LOW (ref 6–20)
CALCIUM: 9 mg/dL (ref 8.9–10.3)
CO2: 25 mmol/L (ref 22–32)
Chloride: 102 mmol/L (ref 101–111)
Creatinine, Ser: 1.09 mg/dL (ref 0.61–1.24)
GLUCOSE: 262 mg/dL — AB (ref 65–99)
POTASSIUM: 3.5 mmol/L (ref 3.5–5.1)
Sodium: 136 mmol/L (ref 135–145)

## 2016-09-25 MED ORDER — CARVEDILOL 25 MG PO TABS: 25.0000 mg | ORAL_TABLET | Freq: Two times a day (BID) | ORAL | 6 refills | Status: DC

## 2016-09-25 MED ORDER — SACUBITRIL-VALSARTAN 24-26 MG PO TABS: 1.0000 | ORAL_TABLET | Freq: Two times a day (BID) | ORAL | 6 refills | Status: DC

## 2016-09-25 NOTE — Progress Notes (Signed)
Advanced Heart Failure Clinic Note   PCP: Dr. Concepcion Elk HF Cardiology: Dr. Derrek Gu Kevin Long is a 56 y.o. male with history of HTN, diabetes, and nonischemic cardiomyopathy with chronic systolic CHF presents for HF clinic evaluation.  Patient was diagnosed with a cardiomyopathy in around 2008.  He had left heart cath in 2014 with normal coronaries. He was never a heavy drinker and has not used drugs. No family history of cardiomyopathy.  He is very concerned that his cardiomyopathy came from Kevin Long. Echo in 5/17 showed EF 25-30%, echo done today shows EF 25% with diffuse hypokinesis. He was unable to fit in MRI for cardiac MRI.   He presents today for regular follow up. Last clinic visit coreg increased. Saw Pharm-D 08/18/16 and spiro increased, as well as Entresto corrected to BID dosing. Is having dizziness. States it feels like room is spinning, and when he turns his head it feels like his vision is turning to catch up. States he has had this chronically and intermittently, but has been worse over past 45 days. Has been good from HF standpoint. Can push push a mower without difficulty (it is somewhat self propelled), but he can now do the whole yard. No SOB on flat ground at pace. Mild SOB with stairs or incline. Still occasional has chest heaviness with SOB, but not as often.  Taking all medications as directed.   Labs (6/17): LDL 155, K 4.6, creatinine 1.19 Labs (10/17): K 3.7, creatinine 1.1, BNP 78 Labs (12/17): K 3.6, creatinine 1.15 Labs (3/18): K 3.8, creatinine 1.09  PMH: 1. Type II diabetes 2. Hyperlipidemia: has not tolerated atorvastatin or pravastatin.  3. HTN 4. OSA: Not using CPAP.  5. OA: Primarily knees.  6. Chronic systolic CHF: Nonischemic cardiomyopathy.   - Cardiolite 2008 with EF 44%, no ischemia.  - LHC (2014): EF 25%, normal coronaries.  - Echo (1/15): EF 25-30%. - Echo (5/17): EF 25-30%, moderate LV dilation, mild MR, PASP 48 mmHg.  - Echo (3/18): EF 25%,  diffuse hypokinesis, normal RV size and systolic function.  7. PVCs: Noted by 30 day monitor, not quantified.  SH: Lives in [place], quit smoking in his 40s, no drugs, no ETOH, used to be a IT trainer.   FH: No cardiomyopathy or other cardiac history that he is aware of.  Review of systems complete and found to be negative unless listed in HPI.    Current Outpatient Prescriptions  Medication Sig Dispense Refill  . aspirin EC 81 MG tablet Take 1 tablet (81 mg total) by mouth daily. 90 tablet 3  . carvedilol (COREG) 12.5 MG tablet Take 1.5 tablets (18.75 mg total) by mouth 2 (two) times daily. 270 tablet 3  . escitalopram (LEXAPRO) 10 MG tablet Take 10 mg by mouth daily.    . fluticasone (FLONASE) 50 MCG/ACT nasal spray Place 2 sprays into the nose daily as needed for allergies. For seasonal allergies    . furosemide (LASIX) 40 MG tablet Take 40 mg by mouth daily.    . Insulin Glargine (LANTUS SOLOSTAR) 100 UNIT/ML SOPN Inject 60 Units into the skin at bedtime.     . meclizine (ANTIVERT) 25 MG tablet Take 25 mg by mouth 3 (three) times daily as needed for dizziness.    . metFORMIN (GLUCOPHAGE) 500 MG tablet Take by mouth 2 (two) times daily with a meal.    . sacubitril-valsartan (ENTRESTO) 24-26 MG Take 1 tablet by mouth 2 (two) times daily. 60 tablet 6  . sitaGLIPtin (JANUVIA)  100 MG tablet Take 100 mg by mouth daily.    Marland Kitchen spironolactone (ALDACTONE) 25 MG tablet Take 1 tablet (25 mg total) by mouth daily. 90 tablet 3  . albuterol (PROVENTIL HFA;VENTOLIN HFA) 108 (90 BASE) MCG/ACT inhaler Inhale 2 puffs into the lungs every 4 (four) hours as needed for wheezing or shortness of breath. For wheezing or shortness of breath     No current facility-administered medications for this encounter.    Vitals:   09/25/16 1341  BP: (!) 152/86  Pulse: 80  SpO2: 97%  Weight: 265 lb 12.8 oz (120.6 kg)   Wt Readings from Last 3 Encounters:  09/25/16 265 lb 12.8 oz (120.6 kg)  08/18/16 272 lb (123.4  kg)  07/28/16 266 lb (120.7 kg)    General: Well appearing. No resp difficulty. HEENT: Normal Neck: Supple. JVP 7-8 cm. Carotids 2+ bilat; no bruits. No thyromegaly or nodule noted. Cor: PMI nondisplaced. RRR, No M/G/R noted Lungs: CTAB, normal effort. Abdomen: Soft, non-tender, non-distended, no HSM. No bruits or masses. +BS  Extremities: No cyanosis, clubbing, rash, R and LLE no edema.  Neuro: Alert & orientedx3, cranial nerves grossly intact. moves all 4 extremities w/o difficulty. Affect pleasant   Assessment/Plan: 1. Chronic systolic CHF: Nonischemic cardiomyopathy.  No family history of cardiomyopathy, no heavy ETOH or drugs.  Echo 06/27/2016  EF remains 25%. Unable to fit cMRI.   EF has been low since at least 2008.  - NYHA II-III symptoms. - Volume status stable on exam. Continue lasix 40 mg daily.  - Continue Entresto at 24/26 bid.  BMET today. He refuses up-titration. Was previously on 49/51 with severe lightheadedness  - Continue spironolactone 25 mg daily.  - Increase coreg to 25 mg BID.  - Lightheaded with Bidil, and refuses re-challenge.  - Reinforced fluid restriction to < 2 L daily, sodium restriction to less than 2000 mg daily, and the importance of daily weights.   2. OSA:  - Suspected. Refuses sleep study.  3. HTN:  - Meds as above.  4. Hyperlipidemia:  - Intolerant to statins with "fuzziness" in his head. Refuses to rechallenge. No change. 5. BPPV/Vertigo - Meclizine not really helping. Symptoms very consistent with BPPV, occur with lying in bed and turning head and quickly glancing in a different direction - Will send to [facility] for Vestibular retrainining  Meds as above. BMET today. Repeat echo in 6 weeks to look for EF recovery with improved med compliance. If EF remains low, will need EP referral for ICD consideration.   RTC 2 months to review Echo with MD visit.   Graciella Freer, PA-C  09/25/2016  Greater than 50% of the 25 minute visit was  spent in counseling/coordination of care regarding disease state education, salt/fluid restriction, medication reconciliation, medication compliance, and discussion of medical regimen with on-site Pharm-D.

## 2016-09-25 NOTE — Patient Instructions (Addendum)
INCREASE Coreg 25 mg, one tab twice a day  Your physician has requested that you have an echocardiogram. Echocardiography is a painless test that uses sound waves to create images of your heart. It provides your doctor with information about the size and shape of your heart and how well your heart's chambers and valves are working. This procedure takes approximately one hour. There are no restrictions for this procedure.  You have been referred to [facility] for further evaluation of vertigo/ dizziness Address: [address], [place], [place] [address] Phone: [phone] __________________________________________________________ __________________________________________________________  Your physician recommends that you schedule a follow-up appointment in: 2 months with Dr Shirlee Latch  Do the following things EVERYDAY: 1) Weigh yourself in the morning before breakfast. Write it down and keep it in a log. 2) Take your medicines as prescribed 3) Eat low salt foods-Limit salt (sodium) to 2000 mg per day.  4) Stay as active as you can everyday 5) Limit all fluids for the day to less than 2 liters

## 2016-09-26 ENCOUNTER — Telehealth (HOSPITAL_COMMUNITY): Payer: Self-pay | Admitting: Cardiology

## 2016-09-26 DIAGNOSIS — I509 Heart failure, unspecified: Secondary | ICD-10-CM

## 2016-09-26 NOTE — Telephone Encounter (Signed)
-----   Message from Graciella Freer, PA-C sent at 09/26/2016  7:31 AM EDT ----- OK. Will not increase K for now with Cleda Daub and Sherryll Burger.  Recheck BMET 2 weeks, please.    Casimiro Needle [address]" [place], [place] 09/26/2016 7:31 AM

## 2016-09-30 ENCOUNTER — Encounter: Payer: Self-pay | Admitting: Neurology

## 2016-09-30 ENCOUNTER — Ambulatory Visit (INDEPENDENT_AMBULATORY_CARE_PROVIDER_SITE_OTHER): Payer: Medicaid Other | Admitting: Neurology

## 2016-09-30 VITALS — BP 147/81 | HR 76 | Ht 66.0 in | Wt 268.0 lb

## 2016-09-30 DIAGNOSIS — H8110 Benign paroxysmal vertigo, unspecified ear: Secondary | ICD-10-CM

## 2016-09-30 DIAGNOSIS — H9313 Tinnitus, bilateral: Secondary | ICD-10-CM

## 2016-09-30 NOTE — Patient Instructions (Addendum)
Please remember, that vertigo can recur without warning, triggers could be stress, sleep deprivation, dehydration and even taking a bumpy car ride, sometimes an acute illness, even a common (viral) cold. It can last hours or days. Please change positions slowly and always stay well-hydrated. Physical therapy with particular attention to vestibular rehabilitation can be very helpful. While there is no specific medication that helps with vertigo, some people get relief with as needed use of meclizine. Certain medications can exacerbate vertigo.  As discussed, I will make a referral to physical therapy.   We will do a brain scan, called MRI and call you with the test results. We will have to schedule you for this on a separate date. This test requires authorization from your insurance, and we will take care of the insurance process.  If you have ongoing vertigo, please talk to your PCP about seeing an ENT specialist.   Neurologically, you have a non-focal exam, which is reassuring. For intermittent headaches, you can take tylenol as needed.   Please see your ophthalmologist, as it has been 2 years or longer and you complain of blurry vision. As a diabetic, you should seen an eye doctor at least once a year.
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
## 2016-09-30 NOTE — Progress Notes (Signed)
Thank you :)

## 2016-10-10 ENCOUNTER — Ambulatory Visit (HOSPITAL_COMMUNITY)
Admission: RE | Admit: 2016-10-10 | Discharge: 2016-10-10 | Disposition: A | Discharge status: Home / Self Care | Payer: Medicaid Other | Source: Ambulatory Visit | Attending: Internal Medicine | Admitting: Internal Medicine

## 2016-10-10 DIAGNOSIS — I509 Heart failure, unspecified: Secondary | ICD-10-CM

## 2016-10-10 LAB — BASIC METABOLIC PANEL
Anion gap: 10 (ref 5–15)
BUN: 6 mg/dL (ref 6–20)
CALCIUM: 8.7 mg/dL — AB (ref 8.9–10.3)
CHLORIDE: 103 mmol/L (ref 101–111)
CO2: 24 mmol/L (ref 22–32)
CREATININE: 1.05 mg/dL (ref 0.61–1.24)
Glucose, Bld: 187 mg/dL — ABNORMAL HIGH (ref 65–99)
Potassium: 3.6 mmol/L (ref 3.5–5.1)
SODIUM: 137 mmol/L (ref 135–145)

## 2016-10-13 ENCOUNTER — Telehealth: Payer: Self-pay | Admitting: Neurology

## 2016-10-13 NOTE — Telephone Encounter (Signed)
I called pt. I advised him that his MRI was denied by Medicaid, but that Dr. Frances Furbish recommends that pt continue with vestibular rehab through physical therapy for his vertigo, and also to see an ophthalmologist and an ENT. Pt wants to know how much the MRI would be if he wanted to pay for it out of pocket. I advised him that I will send this to Irving Burton to research and call him back. Pt says that he will discuss with his PCP a good ophthalmologist and ENT for him to see. Pt reports that neuro rehab has not called him to schedule vestibular rehab yet. I will ask Annabelle Harman to follow up on this. Pt verbalized understanding of these recommendations.

## 2016-10-13 NOTE — Telephone Encounter (Signed)
Please call patient and advise him that his insurance is not covering his brain MRI. At this point, I suggest that we follow him clinically. Thankfully, since his neurological exam was nonfocal we can do so, and see how he does after vestibular rehabilitation through physical therapy, as far as his vertigo goes. My other recommendations were to see ophthalmology for a full eye exam and ENT (please remind pt).

## 2016-10-13 NOTE — Telephone Encounter (Signed)
Alvino Chapel with [facility] emailed me on Friday but I was not here so I was unable to respond to her. She informed me that medicaid did not approve the MRI Brain and was pending a peer to peer. The phone number to the peer to peer is [phone]. The case number is [id number].  He is scheduled to have this MRI done at [facility] tomorrow at 6 PM.

## 2016-10-14 ENCOUNTER — Other Ambulatory Visit: Payer: Medicaid Other

## 2016-10-14 NOTE — Telephone Encounter (Signed)
I have sent Kizzy a referral message to call and schedule the patient when she has time. Thanks Annabelle Harman.  Neuro Rehab veistubular rehab. I have called and spoke to patient with details.

## 2016-10-14 NOTE — Telephone Encounter (Signed)
I spoke to the patient and informed me that I was not sure if he could be self pay due to the patient having insurance. I gave him [facility] imaging phone number [phone] to give them a call and see what their process is. Also if he didn't want to keep the appointment to cancel it. Patient understood and will call [facility].

## 2016-11-19 ENCOUNTER — Ambulatory Visit (HOSPITAL_COMMUNITY)
Admission: RE | Admit: 2016-11-19 | Discharge: 2016-11-19 | Disposition: A | Discharge status: Home / Self Care | Payer: Medicaid Other | Source: Ambulatory Visit | Attending: Internal Medicine | Admitting: Internal Medicine

## 2016-11-19 DIAGNOSIS — E7800 Pure hypercholesterolemia, unspecified: Secondary | ICD-10-CM | POA: Insufficient documentation

## 2016-11-19 DIAGNOSIS — I110 Hypertensive heart disease with heart failure: Secondary | ICD-10-CM | POA: Diagnosis not present

## 2016-11-19 DIAGNOSIS — I428 Other cardiomyopathies: Secondary | ICD-10-CM | POA: Diagnosis not present

## 2016-11-19 DIAGNOSIS — E669 Obesity, unspecified: Secondary | ICD-10-CM | POA: Insufficient documentation

## 2016-11-19 DIAGNOSIS — I509 Heart failure, unspecified: Secondary | ICD-10-CM | POA: Insufficient documentation

## 2016-11-19 DIAGNOSIS — I340 Nonrheumatic mitral (valve) insufficiency: Secondary | ICD-10-CM | POA: Diagnosis not present

## 2016-11-19 DIAGNOSIS — E119 Type 2 diabetes mellitus without complications: Secondary | ICD-10-CM | POA: Insufficient documentation

## 2016-11-19 NOTE — Progress Notes (Signed)
  Echocardiogram 2D Echocardiogram has been performed.  Kevin Long 11/19/2016, 1:49 PM

## 2016-11-21 ENCOUNTER — Telehealth (HOSPITAL_COMMUNITY): Payer: Self-pay

## 2016-11-21 NOTE — Telephone Encounter (Signed)
ECHOCARDIOGRAM COMPLETE  Order: [id number]  Status:  Final result Visible to patient:  Yes (MyChart) Dx:  Nonischemic cardiomyopathy (HCC)  Notes recorded by Chyrl Civatte, RN on 11/21/2016 at 4:25 PM EDT Attempted to call, no answer. Will put note on apt desk to discuss referral at 9/4 apt ------  Notes recorded by Graciella Freer, PA-C on 11/19/2016 at 3:41 PM EDT He needs ICD referral. Can review further at office visit 12/02/16.    Casimiro Needle [address]" [place], [place] 11/19/2016 3:41 PM

## 2016-12-02 ENCOUNTER — Ambulatory Visit (HOSPITAL_COMMUNITY)
Admission: RE | Admit: 2016-12-02 | Discharge: 2016-12-02 | Disposition: A | Discharge status: Home / Self Care | Payer: Medicaid Other | Source: Ambulatory Visit | Attending: Cardiology | Admitting: Cardiology

## 2016-12-02 ENCOUNTER — Encounter (HOSPITAL_COMMUNITY): Payer: Self-pay | Admitting: Cardiology

## 2016-12-02 VITALS — BP 129/76 | HR 79 | Wt 274.2 lb

## 2016-12-02 DIAGNOSIS — Z7982 Long term (current) use of aspirin: Secondary | ICD-10-CM | POA: Diagnosis not present

## 2016-12-02 DIAGNOSIS — I5022 Chronic systolic (congestive) heart failure: Secondary | ICD-10-CM | POA: Diagnosis not present

## 2016-12-02 DIAGNOSIS — E785 Hyperlipidemia, unspecified: Secondary | ICD-10-CM | POA: Diagnosis not present

## 2016-12-02 DIAGNOSIS — Z79899 Other long term (current) drug therapy: Secondary | ICD-10-CM | POA: Diagnosis not present

## 2016-12-02 DIAGNOSIS — Z87891 Personal history of nicotine dependence: Secondary | ICD-10-CM | POA: Diagnosis not present

## 2016-12-02 DIAGNOSIS — Z794 Long term (current) use of insulin: Secondary | ICD-10-CM | POA: Insufficient documentation

## 2016-12-02 DIAGNOSIS — I110 Hypertensive heart disease with heart failure: Secondary | ICD-10-CM | POA: Insufficient documentation

## 2016-12-02 DIAGNOSIS — E119 Type 2 diabetes mellitus without complications: Secondary | ICD-10-CM | POA: Diagnosis not present

## 2016-12-02 DIAGNOSIS — G4733 Obstructive sleep apnea (adult) (pediatric): Secondary | ICD-10-CM

## 2016-12-02 LAB — BASIC METABOLIC PANEL
Anion gap: 7 (ref 5–15)
BUN: <5 mg/dL — ABNORMAL LOW (ref 6–20)
CHLORIDE: 106 mmol/L (ref 101–111)
CO2: 27 mmol/L (ref 22–32)
Calcium: 8.8 mg/dL — ABNORMAL LOW (ref 8.9–10.3)
Creatinine, Ser: 1.08 mg/dL (ref 0.61–1.24)
GFR calc Af Amer: >60 mL/min (ref >60)
Glucose, Bld: 121 mg/dL — ABNORMAL HIGH (ref 65–99)
Potassium: 3.4 mmol/L — ABNORMAL LOW (ref 3.5–5.1)
Sodium: 140 mmol/L (ref 135–145)

## 2016-12-02 LAB — BRAIN NATRIURETIC PEPTIDE: B Natriuretic Peptide: 23.5 pg/mL (ref 0.0–100.0)

## 2016-12-02 MED ORDER — LOSARTAN POTASSIUM 25 MG PO TABS: 25.0000 mg | ORAL_TABLET | Freq: Every day | ORAL | 3 refills | Status: DC

## 2016-12-02 MED ORDER — SPIRONOLACTONE 25 MG PO TABS: 12.5000 mg | ORAL_TABLET | Freq: Every day | ORAL | 3 refills | Status: DC

## 2016-12-02 NOTE — Progress Notes (Signed)
PCP: Dr. Concepcion Elk HF Cardiology: Dr. Shirlee Latch  56 yo with history of HTN, diabetes, and nonischemic cardiomyopathy with chronic systolic CHF presents for HF clinic evaluation.  Patient was diagnosed with a cardiomyopathy in around 2008.  He had left heart cath in 2014 with normal coronaries. He was never a heavy drinker and has not used drugs. No family history of cardiomyopathy.  He is very concerned that his cardiomyopathy came from Darvocet. Echo in 5/17 showed EF 25-30%, echo done today shows EF 25% with diffuse hypokinesis. He was unable to fit in MRI for cardiac MRI.   He stopped taking spironolactone and Entresto again.  He has had BPPV, and thinks one of the two must be causing his vertigo.  He saw neurology but did not get a head MRI or do vestibular rehab.  Vertigo resolved over time, now has it only rarely.  Echo was done today, EF 25-30% with severe LV dilation.  Generally, he does ok walking on flat ground.  He is short of breath with stairs and inclines.  Weight is up 9 lbs.  He has chronic 3 pillow orthopnea.  No chest pain.  No lightheadedness.  He was diagnosed with OSA in the past but does not use CPAP.  He has daytime sleepiness.   Labs (6/17): LDL 155, K 4.6, creatinine 1.19 Labs (10/17): K 3.7, creatinine 1.1, BNP 78 Labs (12/17): K 3.6, creatinine 1.15 Labs (3/18): K 3.8, creatinine 1.09  ECG (personally reviewed): NSR, lateral TWIs  PMH: 1. Type II diabetes 2. Hyperlipidemia: has not tolerated atorvastatin or pravastatin.  3. HTN 4. OSA: Not using CPAP.  5. OA: Primarily knees.  6. Chronic systolic CHF: Nonischemic cardiomyopathy.   - Cardiolite 2008 with EF 44%, no ischemia.  - LHC (2014): EF 25%, normal coronaries.  - Echo (1/15): EF 25-30%. - Echo (5/17): EF 25-30%, moderate LV dilation, mild MR, PASP 48 mmHg.  - Echo (3/18): EF 25%, diffuse hypokinesis, normal RV size and systolic function.  - Echo (8/18): EF 25-30%, severe LV dilation, mild MR, moderate RV dilation  with mildly decreased systolic function.  7. PVCs: Noted by 30 day monitor, not quantified.  SH: Lives in [place], quit smoking in his 40s, no drugs, no ETOH, used to be a IT trainer.   FH: No cardiomyopathy or other cardiac history that he is aware of.  ROS: All systems reviewed and negative except as per HPI.   Current Outpatient Prescriptions  Medication Sig Dispense Refill  . albuterol (PROVENTIL HFA;VENTOLIN HFA) 108 (90 BASE) MCG/ACT inhaler Inhale 2 puffs into the lungs every 4 (four) hours as needed for wheezing or shortness of breath. For wheezing or shortness of breath    . aspirin EC 81 MG tablet Take 1 tablet (81 mg total) by mouth daily. 90 tablet 3  . atorvastatin (LIPITOR) 40 MG tablet Take 40 mg by mouth daily.    . carvedilol (COREG) 25 MG tablet Take 1 tablet (25 mg total) by mouth 2 (two) times daily. 60 tablet 6  . escitalopram (LEXAPRO) 10 MG tablet Take 10 mg by mouth daily.    . fluticasone (FLONASE) 50 MCG/ACT nasal spray Place 2 sprays into the nose daily as needed for allergies. For seasonal allergies    . furosemide (LASIX) 40 MG tablet Take 40 mg by mouth daily.    . Insulin Glargine (LANTUS SOLOSTAR) 100 UNIT/ML SOPN Inject 60 Units into the skin at bedtime.     . meclizine (ANTIVERT) 25 MG tablet Take  25 mg by mouth 3 (three) times daily as needed for dizziness.    . metFORMIN (GLUCOPHAGE) 500 MG tablet Take by mouth 2 (two) times daily with a meal.    . sitaGLIPtin (JANUVIA) 100 MG tablet Take 100 mg by mouth daily.    Marland Kitchen losartan (COZAAR) 25 MG tablet Take 1 tablet (25 mg total) by mouth daily. 30 tablet 3  . spironolactone (ALDACTONE) 25 MG tablet Take 0.5 tablets (12.5 mg total) by mouth daily. 15 tablet 3   No current facility-administered medications for this encounter.    BP 129/76   Pulse 79   Wt 274 lb 4 oz (124.4 kg)   SpO2 99%   BMI 44.27 kg/m  General: NAD Neck: Thick, JVP 7-8 cm, no thyromegaly or thyroid nodule.  Lungs: Clear to  auscultation bilaterally with normal respiratory effort. CV: Nondisplaced PMI.  Heart regular S1/S2, no S3/S4, no murmur.  Trace ankle edema.  No carotid bruit.  Normal pedal pulses.  Abdomen: Soft, nontender, no hepatosplenomegaly, no distention.  Skin: Intact without lesions or rashes.  Neurologic: Alert and oriented x 3.  Psych: Normal affect. Extremities: No clubbing or cyanosis.  HEENT: Normal.   Assessment/Plan: 1. Chronic systolic CHF: Nonischemic cardiomyopathy.  No family history of cardiomyopathy, no heavy ETOH or drugs.  Echo today was reviewed, EF remains 25-30% with moderate RV dysfunction.  He was unable to fit for cardiac MRI.  EF has been low since at least 2008.  NYHA class II-III symptoms.  He is not volume overloaded.  He is not taking Entresto or spironolactone => thinks they caused his BPPV.  - Do not think BPPV was caused by his meds.  He has repeatedly stopped Entresto.  I will instead use a low dose of losartan, 25 mg daily.  I will also start back spironolactone 12.5 mg daily.  BMET/BNP today, BMET in 10 days.  - I will refer him for ICD evaluation, prefer [employer] device for [employer].  He is not a CRT candidate.   - Continue Lasix 40 mg daily.   - Continue current Coreg.  - Lightheaded with Bidil, has not wanted to re-try.  2. OSA: Prior diagnosis, not on CPAP.  Significant daytime sleepiness.  I will refer to Dr. Mayford Knife for evaluation.  3. HTN: BP controlled.  4. Hyperlipidemia: He is on atorvastatin 20 daily.   5. Diabetes: He is on sitagliptin, which may worsen CHF.  Would encourage PCP to consider switching from sitagliptin to empagliflozin.  Followup with PA/NP in 1 month.  Marca Ancona 12/02/2016

## 2016-12-02 NOTE — Patient Instructions (Addendum)
Start Losartan 25 mg (1 tab) daily  Start Spironolactone 12.5 mg (1/2 tab) daily  Labs drawn today  Your physician recommends that you return for lab work in: 10 days  You have been referred to EP  You have been referred to have sleep study  Your physician recommends that you schedule a follow-up appointment in: 1 month

## 2016-12-03 MED ORDER — LOSARTAN POTASSIUM 25 MG PO TABS: 25.0000 mg | ORAL_TABLET | Freq: Every day | ORAL | 3 refills | Status: DC

## 2016-12-03 MED ORDER — SPIRONOLACTONE 25 MG PO TABS
12.5000 mg | ORAL_TABLET | Freq: Every day | ORAL | 3 refills | Status: DC
Start: 2016-12-03 — End: 2019-06-22

## 2016-12-03 NOTE — Addendum Note (Signed)
Encounter addended by: Teresa Coombs, RN on: 12/03/2016 11:15 AM<BR>    Actions taken: Order list changed

## 2016-12-04 ENCOUNTER — Ambulatory Visit: Payer: Medicaid Other | Admitting: Physician Assistant

## 2016-12-04 ENCOUNTER — Telehealth: Payer: Self-pay | Admitting: *Deleted

## 2016-12-04 NOTE — Telephone Encounter (Signed)
S/w pt Herma Carson, PA, stated pt did not have to come in today for ov appt. Due to being seen 9/4 at the CHF clinic and having a f/u appt in October with CHF clinic.  Pt was agreeable.

## 2016-12-08 ENCOUNTER — Institutional Professional Consult (permissible substitution): Payer: Medicaid Other | Admitting: Internal Medicine

## 2016-12-12 ENCOUNTER — Other Ambulatory Visit (HOSPITAL_COMMUNITY): Payer: Medicaid Other

## 2017-01-01 ENCOUNTER — Encounter (HOSPITAL_COMMUNITY): Payer: Medicaid Other | Admitting: Cardiology

## 2017-01-25 ENCOUNTER — Encounter (HOSPITAL_BASED_OUTPATIENT_CLINIC_OR_DEPARTMENT_OTHER): Payer: Medicaid Other

## 2019-06-22 ENCOUNTER — Other Ambulatory Visit: Payer: Self-pay

## 2019-06-22 ENCOUNTER — Encounter: Payer: Self-pay | Admitting: Cardiology

## 2019-06-22 ENCOUNTER — Ambulatory Visit: Payer: Medicaid Other | Admitting: Cardiology

## 2019-06-22 VITALS — BP 118/82 | HR 90 | Temp 98.1°F | Resp 16 | Ht 66.0 in | Wt 261.6 lb

## 2019-06-22 DIAGNOSIS — I428 Other cardiomyopathies: Secondary | ICD-10-CM

## 2019-06-22 DIAGNOSIS — I5022 Chronic systolic (congestive) heart failure: Secondary | ICD-10-CM

## 2019-06-22 DIAGNOSIS — Z6841 Body Mass Index (BMI) 40.0 and over, adult: Secondary | ICD-10-CM

## 2019-06-22 DIAGNOSIS — R0609 Other forms of dyspnea: Secondary | ICD-10-CM

## 2019-06-22 MED ORDER — BIDIL 20-37.5 MG PO TABS: 1.0000 | ORAL_TABLET | Freq: Three times a day (TID) | ORAL | 2 refills | Status: DC

## 2019-06-22 NOTE — Progress Notes (Addendum)
Primary Physician/Referring:  Nolene Ebbs, MD  Patient ID: Kevin Long, male    DOB: 1960-11-23, 59 y.o.   MRN: [id number]  Chief Complaint  Patient presents with  . New Patient (Initial Visit)    sob  . Congestive Heart Failure   HPI:    Kevin Long  is a 59 y.o. African-American male with hypertension, diabetes mellitus, obstructive sleep apnea not on CPAP, nonischemic cardiomyopathy with chronic systolic heart failure diagnosed around 2008, last cardiac catheterization in 2014 revealing normal coronary arteries.  He has no history of heavy alcohol use.  He was last seen by heart failure clinic in September 2018.  Patient states that over the past 1 to 2 years, he was recently himself with he had and had discontinued all of his medications.  He recently reestablished with his PCP and was restarted back on carvedilol and insulin and referred to me for further evaluation. Except for shortness of breath that is chronic, no other specific complaints today.  States that dyspnea may be slightly worse over time.  No leg edema, no PND or orthopnea.  Past Medical History:  Diagnosis Date  . Asthma   . Diabetes mellitus, type 2 ([place])   . Hypercholesterolemia   . Hypertension   . Obesity   . OSA (obstructive sleep apnea)   . Other primary cardiomyopathies    Past Surgical History:  Procedure Laterality Date  . FOOT SURGERY     has pin and screws  . LEFT AND RIGHT HEART CATHETERIZATION WITH CORONARY ANGIOGRAM N/A 08/04/2012   Procedure: LEFT AND RIGHT HEART CATHETERIZATION WITH CORONARY ANGIOGRAM;  Surgeon: Jettie Booze, MD;  Location: [facility] CATH LAB;  Service: Cardiovascular;  Laterality: N/A;   Family History  Problem Relation Age of Onset  . Diabetes Mother   . Anxiety disorder Brother   . Depression Son   . Heart murmur Son   . Heart attack Neg Hx   . Hypertension Neg Hx   . Stroke Neg Hx     Social History   Tobacco Use  . Smoking status: Former Smoker    Quit  date: 03/31/2005    Years since quitting: 14.2  . Smokeless tobacco: Never Used  Substance Use Topics  . Alcohol use: No   ROS  Review of Systems  Cardiovascular: Positive for dyspnea on exertion. Negative for chest pain, leg swelling, orthopnea and paroxysmal nocturnal dyspnea.  Gastrointestinal: Negative for melena.   Objective  Blood pressure 118/82, pulse 90, temperature 98.1 F (36.7 C), resp. rate 16, height 5' 6" (1.676 m), weight 261 lb 9.6 oz (118.7 kg), SpO2 95 %.  Vitals with BMI 06/22/2019 12/02/2016 09/30/2016  Height 5' 6" - 5' 6"  Weight 261 lbs 10 oz 274 lbs 4 oz 268 lbs  BMI 62.69 - 48.5  Systolic 462 703 500  Diastolic 82 76 81  Pulse 90 79 76     Physical Exam  Constitutional:  Morbidly obese in no acute distress.  Cardiovascular: Normal rate, regular rhythm and normal heart sounds. Exam reveals no gallop.  No murmur heard. Pulses:      Carotid pulses are 2+ on the right side and 2+ on the left side.      Dorsalis pedis pulses are 2+ on the right side and 2+ on the left side.       Posterior tibial pulses are 2+ on the right side and 2+ on the left side.  Femoral and popliteal pulse difficult to feel due  to patient's body habitus.  No leg edema. JVD difficult to see due to short neck.  Pulmonary/Chest: Effort normal and breath sounds normal.  Abdominal: Soft. Bowel sounds are normal.  Obese. Pannus present   Laboratory examination:   External labs:   06/09/2019: BUN 10, creatinine 1.03, potassium 3.5, EGFR >60 mL.  10/18/2018: A1c 11.3%.  TSH normal at 1.34. Total cholesterol 246, triglycerides 112, HDL 56, LDL 166.  Non-HDL cholesterol 190. Serum glucose 234, BUN 7, creatinine 0.93, EGFR 104 mL.  Medications and allergies   Allergies  Allergen Reactions  . Celebrex [Celecoxib] Swelling    Swelling in face  . Entresto [Sacubitril-Valsartan] Other (See Comments)    Dizziness     Current Outpatient Medications  Medication Instructions  . aspirin  EC 81 mg, Oral, Daily  . carvedilol (COREG) 12.5 mg, Oral, 2 times daily with meals  . furosemide (LASIX) 40 mg, Oral  . insulin aspart (NOVOLOG) 100 UNIT/ML injection Subcutaneous, 3 times daily before meals  . insulin glargine (LANTUS SOLOSTAR) 70 Units, Subcutaneous, Daily at bedtime  . isosorbide-hydrALAZINE (BIDIL) 20-37.5 MG tablet 1 tablet, Oral, 3 times daily   Radiology:   No results found.  Cardiac Studies:   LEFT AND RIGHT HEART CATHETERIZATION WITH CORONARY ANGIOGRAM Non ischemic cardiomyopathy 08/04/2012   Echocardiogram 11/19/2016: Left ventricle is severely dilated, LV systolic function severely reduced with EF 25 to 30% with global hypokinesis.  Grade 2 diastolic dysfunction. Right ventricle is moderately dilated.  Right ventricular systolic function moderately reduced. No significant valvular abnormality.  EKG  EKG 06/22/2019: Normal sinus rhythm with rate of 97 bpm, left atrial enlargement, nonspecific T abnormality.     Assessment     ICD-10-CM   1. Chronic systolic CHF (congestive heart failure) (HCC)  I50.22 EKG 12-Lead    PCV ECHOCARDIOGRAM COMPLETE    isosorbide-hydrALAZINE (BIDIL) 20-37.5 MG tablet  2. Non-ischemic cardiomyopathy (HCC)  I42.8 isosorbide-hydrALAZINE (BIDIL) 20-37.5 MG tablet  3. DOE (dyspnea on exertion)  R06.00 EKG 12-Lead  4. Class 3 severe obesity due to excess calories with serious comorbidity and body mass index (BMI) of 40.0 to 44.9 in adult [facility])  E66.01    Z68.41     Meds ordered this encounter  Medications  . isosorbide-hydrALAZINE (BIDIL) 20-37.5 MG tablet    Sig: Take 1 tablet by mouth 3 (three) times daily.    Dispense:  90 tablet    Refill:  2    Medications Discontinued During This Encounter  Medication Reason  . carvedilol (COREG) 25 MG tablet Error  . amLODipine (NORVASC) 5 MG tablet Error  . escitalopram (LEXAPRO) 10 MG tablet Error  . albuterol (PROVENTIL HFA;VENTOLIN HFA) 108 (90 BASE) MCG/ACT inhaler Patient  Discharge  . furosemide (LASIX) 40 MG tablet Patient Preference  . atorvastatin (LIPITOR) 40 MG tablet Patient Preference  . dapagliflozin propanediol (FARXIGA) 10 MG TABS tablet Patient Preference  . metFORMIN (GLUCOPHAGE) 500 MG tablet Patient Preference  . sitaGLIPtin (JANUVIA) 100 MG tablet Error  . meclizine (ANTIVERT) 25 MG tablet Error  . losartan (COZAAR) 25 MG tablet Patient Preference  . fluticasone (FLONASE) 50 MCG/ACT nasal spray Error  . sacubitril-valsartan (ENTRESTO) 49-51 MG Error  . spironolactone (ALDACTONE) 25 MG tablet Discontinued by provider    Recommendations:   Kevin Long  is a 59 y.o. African-American male with hypertension, diabetes mellitus, obstructive sleep apnea not on CPAP, nonischemic cardiomyopathy with chronic systolic heart failure diagnosed around 2008, last cardiac catheterization in 2014 revealing normal coronary arteries.  Was previously on Entresto discontinued due to dizziness.   Patient has discontinued all his medications and has not seen any physicians for the past year and a half, fortunately has remained stable without acute decompensated heart failure.  He just recently reestablished with his PCP.  Recent labs were performed by his PCP, will obtain them.  I have started him on BiDil 1 p.o. 3 times daily I will await his labs prior to making decisions regarding appropriateness of therapy.  If hypotension becomes an issue, advised the patient to try 1 tablet twice daily for a week prior to escalating it.  Although patient prescribed Entresto and Farxiga by Dr. Jeanie Cooks, not sure he has started this. Encouraged him to bring all medications. Encouraged him to keep sleep medicine referral made by PCP to [facility]. If no lipids and complete labs were done, I could perform them as patient appears unreliable and not sure if he just had BMP or complete panel.   Diabetes continues to be uncontrolled obvious reasons, weight continues to be another issue,  extensive discussion regarding weight loss help.  I reviewed most of his records that were available to me.  I will see him back in 4 to 6 weeks for follow-up, will obtain an echocardiogram prior to his next office visit.  Adrian Prows, MD, [facility] 06/22/2019, 10:06 PM [facility]. [place] Office: [phone]

## 2019-06-23 ENCOUNTER — Ambulatory Visit: Payer: Medicaid Other

## 2019-06-23 DIAGNOSIS — I5022 Chronic systolic (congestive) heart failure: Secondary | ICD-10-CM

## 2019-08-11 ENCOUNTER — Encounter (HOSPITAL_COMMUNITY): Payer: Self-pay

## 2019-08-11 ENCOUNTER — Emergency Department (HOSPITAL_COMMUNITY): Payer: Medicaid Other

## 2019-08-11 ENCOUNTER — Inpatient Hospital Stay (HOSPITAL_COMMUNITY)
Admission: EM | Admit: 2019-08-11 | Discharge: 2019-08-19 | DRG: 871 | Disposition: A | Discharge status: Home Health | Payer: Medicaid Other | Attending: Internal Medicine | Admitting: Internal Medicine

## 2019-08-11 ENCOUNTER — Other Ambulatory Visit: Payer: Self-pay

## 2019-08-11 DIAGNOSIS — H8110 Benign paroxysmal vertigo, unspecified ear: Secondary | ICD-10-CM | POA: Diagnosis present

## 2019-08-11 DIAGNOSIS — J432 Centrilobular emphysema: Secondary | ICD-10-CM | POA: Diagnosis present

## 2019-08-11 DIAGNOSIS — E872 Acidosis, unspecified: Secondary | ICD-10-CM | POA: Diagnosis present

## 2019-08-11 DIAGNOSIS — I420 Dilated cardiomyopathy: Secondary | ICD-10-CM | POA: Diagnosis present

## 2019-08-11 DIAGNOSIS — G9341 Metabolic encephalopathy: Secondary | ICD-10-CM | POA: Diagnosis present

## 2019-08-11 DIAGNOSIS — M79672 Pain in left foot: Secondary | ICD-10-CM | POA: Diagnosis present

## 2019-08-11 DIAGNOSIS — Z66 Do not resuscitate: Secondary | ICD-10-CM | POA: Diagnosis present

## 2019-08-11 DIAGNOSIS — J9601 Acute respiratory failure with hypoxia: Secondary | ICD-10-CM | POA: Diagnosis present

## 2019-08-11 DIAGNOSIS — R7881 Bacteremia: Secondary | ICD-10-CM

## 2019-08-11 DIAGNOSIS — A4189 Other specified sepsis: Principal | ICD-10-CM | POA: Diagnosis present

## 2019-08-11 DIAGNOSIS — I2699 Other pulmonary embolism without acute cor pulmonale: Secondary | ICD-10-CM | POA: Diagnosis present

## 2019-08-11 DIAGNOSIS — E669 Obesity, unspecified: Secondary | ICD-10-CM | POA: Diagnosis present

## 2019-08-11 DIAGNOSIS — R590 Localized enlarged lymph nodes: Secondary | ICD-10-CM | POA: Diagnosis present

## 2019-08-11 DIAGNOSIS — N179 Acute kidney failure, unspecified: Secondary | ICD-10-CM | POA: Diagnosis present

## 2019-08-11 DIAGNOSIS — E785 Hyperlipidemia, unspecified: Secondary | ICD-10-CM | POA: Diagnosis present

## 2019-08-11 DIAGNOSIS — I5043 Acute on chronic combined systolic (congestive) and diastolic (congestive) heart failure: Secondary | ICD-10-CM | POA: Diagnosis present

## 2019-08-11 DIAGNOSIS — B9561 Methicillin susceptible Staphylococcus aureus infection as the cause of diseases classified elsewhere: Secondary | ICD-10-CM | POA: Diagnosis present

## 2019-08-11 DIAGNOSIS — Z794 Long term (current) use of insulin: Secondary | ICD-10-CM

## 2019-08-11 DIAGNOSIS — J1282 Pneumonia due to coronavirus disease 2019: Secondary | ICD-10-CM | POA: Diagnosis present

## 2019-08-11 DIAGNOSIS — Z87891 Personal history of nicotine dependence: Secondary | ICD-10-CM

## 2019-08-11 DIAGNOSIS — J851 Abscess of lung with pneumonia: Secondary | ICD-10-CM | POA: Diagnosis present

## 2019-08-11 DIAGNOSIS — Z23 Encounter for immunization: Secondary | ICD-10-CM

## 2019-08-11 DIAGNOSIS — Z818 Family history of other mental and behavioral disorders: Secondary | ICD-10-CM

## 2019-08-11 DIAGNOSIS — G4733 Obstructive sleep apnea (adult) (pediatric): Secondary | ICD-10-CM | POA: Diagnosis present

## 2019-08-11 DIAGNOSIS — Z833 Family history of diabetes mellitus: Secondary | ICD-10-CM

## 2019-08-11 DIAGNOSIS — R7989 Other specified abnormal findings of blood chemistry: Secondary | ICD-10-CM

## 2019-08-11 DIAGNOSIS — Z79899 Other long term (current) drug therapy: Secondary | ICD-10-CM

## 2019-08-11 DIAGNOSIS — A0839 Other viral enteritis: Secondary | ICD-10-CM | POA: Diagnosis present

## 2019-08-11 DIAGNOSIS — R918 Other nonspecific abnormal finding of lung field: Secondary | ICD-10-CM | POA: Diagnosis present

## 2019-08-11 DIAGNOSIS — Z9119 Patient's noncompliance with other medical treatment and regimen: Secondary | ICD-10-CM

## 2019-08-11 DIAGNOSIS — U071 COVID-19: Secondary | ICD-10-CM | POA: Diagnosis present

## 2019-08-11 DIAGNOSIS — R52 Pain, unspecified: Secondary | ICD-10-CM

## 2019-08-11 DIAGNOSIS — M79671 Pain in right foot: Secondary | ICD-10-CM | POA: Diagnosis present

## 2019-08-11 DIAGNOSIS — Z7982 Long term (current) use of aspirin: Secondary | ICD-10-CM

## 2019-08-11 DIAGNOSIS — E1165 Type 2 diabetes mellitus with hyperglycemia: Secondary | ICD-10-CM | POA: Diagnosis present

## 2019-08-11 DIAGNOSIS — Z888 Allergy status to other drugs, medicaments and biological substances status: Secondary | ICD-10-CM

## 2019-08-11 DIAGNOSIS — I10 Essential (primary) hypertension: Secondary | ICD-10-CM | POA: Diagnosis present

## 2019-08-11 DIAGNOSIS — E7800 Pure hypercholesterolemia, unspecified: Secondary | ICD-10-CM | POA: Diagnosis present

## 2019-08-11 DIAGNOSIS — J45909 Unspecified asthma, uncomplicated: Secondary | ICD-10-CM | POA: Diagnosis present

## 2019-08-11 DIAGNOSIS — I5042 Chronic combined systolic (congestive) and diastolic (congestive) heart failure: Secondary | ICD-10-CM

## 2019-08-11 DIAGNOSIS — R0902 Hypoxemia: Secondary | ICD-10-CM

## 2019-08-11 DIAGNOSIS — J15211 Pneumonia due to Methicillin susceptible Staphylococcus aureus: Secondary | ICD-10-CM

## 2019-08-11 DIAGNOSIS — I110 Hypertensive heart disease with heart failure: Secondary | ICD-10-CM | POA: Diagnosis present

## 2019-08-11 LAB — POCT I-STAT 7, (LYTES, BLD GAS, ICA,H+H)
Acid-base deficit: 7 mmol/L — ABNORMAL HIGH (ref 0.0–2.0)
Bicarbonate: 15.2 mmol/L — ABNORMAL LOW (ref 20.0–28.0)
Calcium, Ion: 1.15 mmol/L (ref 1.15–1.40)
HCT: 46 % (ref 39.0–52.0)
Hemoglobin: 15.6 g/dL (ref 13.0–17.0)
O2 Saturation: 98 %
Patient temperature: 98
Potassium: 4.6 mmol/L (ref 3.5–5.1)
Sodium: 134 mmol/L — ABNORMAL LOW (ref 135–145)
TCO2: 16 mmol/L — ABNORMAL LOW (ref 22–32)
pCO2 arterial: 22.9 mmHg — ABNORMAL LOW (ref 32.0–48.0)
pH, Arterial: 7.429 (ref 7.350–7.450)
pO2, Arterial: 94 mmHg (ref 83.0–108.0)

## 2019-08-11 LAB — URINALYSIS, ROUTINE W REFLEX MICROSCOPIC
Bacteria, UA: NONE SEEN
Bilirubin Urine: NEGATIVE
Glucose, UA: >=500 mg/dL — AB
Ketones, ur: 20 mg/dL — AB
Leukocytes,Ua: NEGATIVE
Nitrite: NEGATIVE
Protein, ur: NEGATIVE mg/dL
Specific Gravity, Urine: 1.03 (ref 1.005–1.030)
pH: 5 (ref 5.0–8.0)

## 2019-08-11 LAB — CBC WITH DIFFERENTIAL/PLATELET
Abs Immature Granulocytes: 0.35 10*3/uL — ABNORMAL HIGH (ref 0.00–0.07)
Basophils Absolute: 0.1 10*3/uL (ref 0.0–0.1)
Basophils Relative: 0 %
Eosinophils Absolute: 0 10*3/uL (ref 0.0–0.5)
Eosinophils Relative: 0 %
HCT: 51.4 % (ref 39.0–52.0)
Hemoglobin: 15.8 g/dL (ref 13.0–17.0)
Immature Granulocytes: 2 %
Lymphocytes Relative: 3 %
Lymphs Abs: 0.6 10*3/uL — ABNORMAL LOW (ref 0.7–4.0)
MCH: 26.7 pg (ref 26.0–34.0)
MCHC: 30.7 g/dL (ref 30.0–36.0)
MCV: 87 fL (ref 80.0–100.0)
Monocytes Absolute: 1.2 10*3/uL — ABNORMAL HIGH (ref 0.1–1.0)
Monocytes Relative: 6 %
Neutro Abs: 17.8 10*3/uL — ABNORMAL HIGH (ref 1.7–7.7)
Neutrophils Relative %: 89 %
Platelets: 345 10*3/uL (ref 150–400)
RBC: 5.91 MIL/uL — ABNORMAL HIGH (ref 4.22–5.81)
RDW: 16.4 % — ABNORMAL HIGH (ref 11.5–15.5)
WBC: 19.9 10*3/uL — ABNORMAL HIGH (ref 4.0–10.5)
nRBC: 0 % (ref 0.0–0.2)

## 2019-08-11 LAB — COMPREHENSIVE METABOLIC PANEL
ALT: 23 U/L (ref 0–44)
AST: 24 U/L (ref 15–41)
Albumin: 3 g/dL — ABNORMAL LOW (ref 3.5–5.0)
Alkaline Phosphatase: 68 U/L (ref 38–126)
Anion gap: 21 — ABNORMAL HIGH (ref 5–15)
BUN: 11 mg/dL (ref 6–20)
CO2: 18 mmol/L — ABNORMAL LOW (ref 22–32)
Calcium: 8.9 mg/dL (ref 8.9–10.3)
Chloride: 100 mmol/L (ref 98–111)
Creatinine, Ser: 1.65 mg/dL — ABNORMAL HIGH (ref 0.61–1.24)
GFR calc Af Amer: 52 mL/min — ABNORMAL LOW (ref >60)
GFR calc non Af Amer: 44 mL/min — ABNORMAL LOW (ref >60)
Glucose, Bld: 392 mg/dL — ABNORMAL HIGH (ref 70–99)
Potassium: 5 mmol/L (ref 3.5–5.1)
Sodium: 139 mmol/L (ref 135–145)
Total Bilirubin: 3.3 mg/dL — ABNORMAL HIGH (ref 0.3–1.2)
Total Protein: 6.9 g/dL (ref 6.5–8.1)

## 2019-08-11 LAB — PROCALCITONIN: Procalcitonin: 0.73 ng/mL

## 2019-08-11 LAB — APTT: aPTT: 24 seconds (ref 24–36)

## 2019-08-11 LAB — PROTIME-INR
INR: 1.7 — ABNORMAL HIGH (ref 0.8–1.2)
Prothrombin Time: 19 seconds — ABNORMAL HIGH (ref 11.4–15.2)

## 2019-08-11 LAB — LACTIC ACID, PLASMA
Lactic Acid, Venous: 4.1 mmol/L (ref 0.5–1.9)
Lactic Acid, Venous: 4.4 mmol/L (ref 0.5–1.9)

## 2019-08-11 LAB — D-DIMER, QUANTITATIVE: D-Dimer, Quant: 11.45 ug/mL-FEU — ABNORMAL HIGH (ref 0.00–0.50)

## 2019-08-11 LAB — LACTATE DEHYDROGENASE: LDH: 542 U/L — ABNORMAL HIGH (ref 98–192)

## 2019-08-11 LAB — TRIGLYCERIDES: Triglycerides: 159 mg/dL — ABNORMAL HIGH (ref <150)

## 2019-08-11 LAB — FIBRINOGEN: Fibrinogen: 543 mg/dL — ABNORMAL HIGH (ref 210–475)

## 2019-08-11 LAB — C-REACTIVE PROTEIN: CRP: 15.6 mg/dL — ABNORMAL HIGH (ref <1.0)

## 2019-08-11 LAB — FERRITIN: Ferritin: 469 ng/mL — ABNORMAL HIGH (ref 24–336)

## 2019-08-11 MED ORDER — IOHEXOL 350 MG/ML SOLN
100.0000 mL | Freq: Once | INTRAVENOUS | Status: AC | PRN
  Administered 2019-08-11: 100 mL via INTRAVENOUS

## 2019-08-11 MED ORDER — ACETAMINOPHEN 500 MG PO TABS
1000.0000 mg | ORAL_TABLET | Freq: Once | ORAL | Status: AC
  Administered 2019-08-11: 1000 mg via ORAL
  Filled 2019-08-11: qty 2

## 2019-08-11 MED ORDER — SODIUM CHLORIDE 0.9 % IV SOLN
1.0000 g | Freq: Once | INTRAVENOUS | Status: AC
  Administered 2019-08-11: 1 g via INTRAVENOUS
  Filled 2019-08-11: qty 10

## 2019-08-11 MED ORDER — ALBUTEROL SULFATE HFA 108 (90 BASE) MCG/ACT IN AERS
2.0000 | INHALATION_SPRAY | RESPIRATORY_TRACT | Status: DC | PRN
  Administered 2019-08-12: 2 via RESPIRATORY_TRACT
  Filled 2019-08-11: qty 6.7

## 2019-08-11 MED ORDER — LACTATED RINGERS IV BOLUS (SEPSIS)
1000.0000 mL | Freq: Once | INTRAVENOUS | Status: AC
  Administered 2019-08-11: 1000 mL via INTRAVENOUS

## 2019-08-11 MED ORDER — SODIUM CHLORIDE 0.9 % IV SOLN
200.0000 mg | Freq: Once | INTRAVENOUS | Status: AC
  Administered 2019-08-12: 200 mg via INTRAVENOUS
  Filled 2019-08-11: qty 40

## 2019-08-11 MED ORDER — GUAIFENESIN-DM 100-10 MG/5ML PO SYRP: 10.0000 mL | ORAL_SOLUTION | ORAL | Status: DC | PRN

## 2019-08-11 MED ORDER — HEPARIN BOLUS VIA INFUSION
5000.0000 [IU] | Freq: Once | INTRAVENOUS | Status: DC
  Filled 2019-08-11: qty 5000

## 2019-08-11 MED ORDER — HYDROCOD POLST-CPM POLST ER 10-8 MG/5ML PO SUER
5.0000 mL | Freq: Two times a day (BID) | ORAL | Status: DC | PRN
  Administered 2019-08-12: 5 mL via ORAL
  Filled 2019-08-11: qty 5

## 2019-08-11 MED ORDER — DEXAMETHASONE SODIUM PHOSPHATE 10 MG/ML IJ SOLN
6.0000 mg | INTRAMUSCULAR | Status: DC
  Administered 2019-08-12: 6 mg via INTRAVENOUS
  Filled 2019-08-11: qty 1

## 2019-08-11 MED ORDER — SODIUM CHLORIDE 0.9 % IV SOLN
100.0000 mg | Freq: Every day | INTRAVENOUS | Status: AC
  Administered 2019-08-13 – 2019-08-16 (×4): 100 mg via INTRAVENOUS
  Filled 2019-08-11 (×4): qty 20

## 2019-08-11 MED ORDER — SODIUM CHLORIDE 0.9 % IV SOLN
500.0000 mg | Freq: Once | INTRAVENOUS | Status: AC
  Administered 2019-08-11: 500 mg via INTRAVENOUS
  Filled 2019-08-11: qty 500

## 2019-08-11 MED ORDER — LACTATED RINGERS IV BOLUS: 1000.0000 mL | Freq: Once | INTRAVENOUS | Status: DC

## 2019-08-11 MED ORDER — HEPARIN (PORCINE) 25000 UT/250ML-% IV SOLN
1600.0000 [IU]/h | INTRAVENOUS | Status: DC
  Filled 2019-08-11: qty 250

## 2019-08-11 NOTE — Sepsis Progress Note (Signed)
Notified provider of need to order antibiotics.   

## 2019-08-11 NOTE — ED Notes (Signed)
Found pt sitting on side of bed, very short of breath, confused.  Pt has urinated all over bed and floor, IV had been pulled out.  Pt cleaned up, put back in bed.

## 2019-08-11 NOTE — ED Notes (Signed)
Called Respiratory for blood gas

## 2019-08-11 NOTE — ED Provider Notes (Addendum)
[facility] EMERGENCY DEPARTMENT Provider Note   CSN: [id number] Arrival date & time: 08/11/19  1439     History Chief Complaint  Patient presents with  . Sepsis alert, Covid pos    Kevin Long is a 59 y.o. male with PMH significant for HTN, HLD, asthma, IDDM who presents the ED via EMS for shortness of breath symptoms.  I obtained history from EMS reports that patient was initially 72% on RA before being placed on 15 L via NRB.  I reviewed patient's medical record and he was found to be positive for COVID-19 on 07/28/2019 at a [facility].  Patient states that he has not been taking his prescribed medications for at least 1 week.  He obtained COVID-19 testing only because his son was positive.  He states that he never really got sick and denies any cough or other significant upper respiratory symptoms.  However, he noticed bilateral foot redness and pain which he states was 10 out of 10, but has since improved after receiving oxygen via EMS.  Patient was a difficult historian.  Attempted to call his wife, Christianne Dolin, however was unable to get her on the phone.  HPI     Past Medical History:  Diagnosis Date  . Asthma   . Diabetes mellitus, type 2 (HCC)   . Hypercholesterolemia   . Hypertension   . Obesity   . OSA (obstructive sleep apnea)   . Other primary cardiomyopathies     Patient Active Problem List   Diagnosis Date Noted  . Uncontrolled type 2 diabetes mellitus with hyperglycemia, with long-term current use of insulin (HCC) 08/12/2019  . Acute metabolic encephalopathy 08/12/2019  . Acute respiratory failure with hypoxemia (HCC) 08/12/2019  . Lactic acidosis 08/12/2019  . Acute pulmonary embolism without acute cor pulmonale (HCC) 08/12/2019  . Sepsis due to COVID-19 (HCC) 08/12/2019  . AKI (acute kidney injury) (HCC) 08/12/2019  . Mass of upper lobe of right lung 08/12/2019  . Chronic combined systolic and diastolic congestive heart failure  (HCC) 09/25/2016  . Vertigo 09/25/2016  . PVC's (premature ventricular contractions) 03/28/2016  . Nonischemic cardiomyopathy (HCC) 08/13/2015  . Palpitations 08/13/2015  . Obesity, unspecified 04/21/2013  . Other primary cardiomyopathies   . DIABETES MELLITUS 02/09/2007  . Hyperlipidemia 02/09/2007  . OBSTRUCTIVE SLEEP APNEA 02/09/2007  . Essential hypertension 02/09/2007  . ANGINA, STABLE 02/09/2007  . ASTHMA 02/09/2007    Past Surgical History:  Procedure Laterality Date  . FOOT SURGERY     has pin and screws  . LEFT AND RIGHT HEART CATHETERIZATION WITH CORONARY ANGIOGRAM N/A 08/04/2012   Procedure: LEFT AND RIGHT HEART CATHETERIZATION WITH CORONARY ANGIOGRAM;  Surgeon: Corky Crafts, MD;  Location: [facility] CATH LAB;  Service: Cardiovascular;  Laterality: N/A;       Family History  Problem Relation Age of Onset  . Diabetes Mother   . Anxiety disorder Brother   . Depression Son   . Heart murmur Son   . Heart attack Neg Hx   . Hypertension Neg Hx   . Stroke Neg Hx     Social History   Tobacco Use  . Smoking status: Former Smoker    Quit date: 03/31/2005    Years since quitting: 14.3  . Smokeless tobacco: Never Used  Substance Use Topics  . Alcohol use: No  . Drug use: No    Home Medications Prior to Admission medications   Medication Sig Start Date End Date Taking? Authorizing Provider  acetaminophen (  TYLENOL) 500 MG tablet Take 1,000 mg by mouth every 6 (six) hours as needed for headache (pain).   Yes [provider]  albuterol (PROVENTIL) (2.5 MG/3ML) 0.083% nebulizer solution Take 2.5 mg by nebulization every 6 (six) hours as needed for wheezing or shortness of breath.   Yes [provider]  albuterol (VENTOLIN HFA) 108 (90 Base) MCG/ACT inhaler Inhale 2 puffs into the lungs 4 (four) times daily as needed for wheezing or shortness of breath.    Yes [provider]  amLODipine (NORVASC) 5 MG tablet Take 5 mg by mouth daily.   Yes  [provider]  Ascorbic Acid (VITAMIN C) 1000 MG tablet Take 1,000 mg by mouth daily.   Yes [provider]  aspirin EC 81 MG tablet Take 1 tablet (81 mg total) by mouth daily. 01/04/16  Yes Laurey Morale, MD  carvedilol (COREG) 12.5 MG tablet Take 12.5 mg by mouth 2 (two) times daily with a meal.   Yes [provider]  Cholecalciferol (VITAMIN D) 50 MCG (2000 UT) tablet Take 2,000 Units by mouth daily.   Yes [provider]  Flaxseed, Linseed, (FLAX SEEDS PO) Take 1 capsule by mouth daily.   Yes [provider]  furosemide (LASIX) 40 MG tablet Take 40 mg by mouth daily as needed (leg swelling).    Yes [provider]  insulin aspart (NOVOLOG FLEXPEN) 100 UNIT/ML FlexPen Inject 15 Units into the skin 2 (two) times daily before a meal.   Yes [provider]  Insulin Glargine (LANTUS SOLOSTAR) 100 UNIT/ML SOPN Inject 70 Units into the skin at bedtime.    Yes [provider]  isosorbide-hydrALAZINE (BIDIL) 20-37.5 MG tablet Take 1 tablet by mouth 3 (three) times daily. 06/22/19  Yes Yates Decamp, MD  Zinc 50 MG TABS Take 50 mg by mouth daily.   Yes [provider]    Allergies    Celebrex [celecoxib] and Entresto [sacubitril-valsartan]  Review of Systems   Review of Systems  All other systems reviewed and are negative.   Physical Exam Updated Vital Signs BP 117/90 (BP Location: Left Wrist)   Pulse 94   Temp 98.1 F (36.7 C) (Oral)   Resp 19   Ht  (1.676 m)   Wt 108.9 kg   SpO2 97%   BMI 38.75 kg/m   Physical Exam Vitals and nursing note reviewed. Exam conducted with a chaperone present.  Constitutional:      General: He is in acute distress.     Appearance: He is obese. He is ill-appearing.  HENT:     Head: Normocephalic and atraumatic.  Eyes:     General: No scleral icterus.    Conjunctiva/sclera: Conjunctivae normal.  Cardiovascular:     Rate and Rhythm: Regular rhythm. Tachycardia  present.     Pulses: Normal pulses.     Heart sounds: Normal heart sounds.  Pulmonary:     Comments: Increased work of breathing.  Tachypnea.  Breath sounds intact bilaterally. Abdominal:     General: Abdomen is flat. There is no distension.     Palpations: Abdomen is soft.     Tenderness: There is no abdominal tenderness. There is no guarding.  Musculoskeletal:     Comments: Feet: Swollen and erythematous bilaterally.  Generalized TTP.  Pedal pulse intact bilaterally.  No obvious wound or source of infection.  Can flex and dorsiflex ankle.  Can wiggle toes. No lower leg tenderness or edema appreciated.  Skin:  General: Skin is dry.     Capillary Refill: Capillary refill takes less than 2 seconds.  Neurological:     Mental Status: He is alert.     GCS: GCS eye subscore is 4. GCS verbal subscore is 5. GCS motor subscore is 6.  Psychiatric:        Mood and Affect: Mood normal.        Behavior: Behavior normal.        Thought Content: Thought content normal.     ED Results / Procedures / Treatments   Labs (all labs ordered are listed, but only abnormal results are displayed) Labs Reviewed  CULTURE, BLOOD (ROUTINE X 2) - Abnormal; Notable for the following components:      Result Value   Culture STAPHYLOCOCCUS AUREUS (*)    All other components within normal limits  BLOOD CULTURE ID PANEL (REFLEXED) - Abnormal; Notable for the following components:   Staphylococcus species DETECTED (*)    Staphylococcus aureus (BCID) DETECTED (*)    All other components within normal limits  LACTIC ACID, PLASMA - Abnormal; Notable for the following components:   Lactic Acid, Venous 4.1 (*)    All other components within normal limits  LACTIC ACID, PLASMA - Abnormal; Notable for the following components:   Lactic Acid, Venous 4.4 (*)    All other components within normal limits  CBC WITH DIFFERENTIAL/PLATELET - Abnormal; Notable for the following components:   WBC 19.9 (*)    RBC 5.91 (*)      RDW 16.4 (*)    Neutro Abs 17.8 (*)    Lymphs Abs 0.6 (*)    Monocytes Absolute 1.2 (*)    Abs Immature Granulocytes 0.35 (*)    All other components within normal limits  COMPREHENSIVE METABOLIC PANEL - Abnormal; Notable for the following components:   CO2 18 (*)    Glucose, Bld 392 (*)    Creatinine, Ser 1.65 (*)    Albumin 3.0 (*)    Total Bilirubin 3.3 (*)    GFR calc non Af Amer 44 (*)    GFR calc Af Amer 52 (*)    Anion gap 21 (*)    All other components within normal limits  D-DIMER, QUANTITATIVE (NOT AT [facility]) - Abnormal; Notable for the following components:   D-Dimer, Quant 11.45 (*)    All other components within normal limits  LACTATE DEHYDROGENASE - Abnormal; Notable for the following components:   LDH 542 (*)    All other components within normal limits  FERRITIN - Abnormal; Notable for the following components:   Ferritin 469 (*)    All other components within normal limits  TRIGLYCERIDES - Abnormal; Notable for the following components:   Triglycerides 159 (*)    All other components within normal limits  FIBRINOGEN - Abnormal; Notable for the following components:   Fibrinogen 543 (*)    All other components within normal limits  C-REACTIVE PROTEIN - Abnormal; Notable for the following components:   CRP 15.6 (*)    All other components within normal limits  PROTIME-INR - Abnormal; Notable for the following components:   Prothrombin Time 19.0 (*)    INR 1.7 (*)    All other components within normal limits  URINALYSIS, ROUTINE W REFLEX MICROSCOPIC - Abnormal; Notable for the following components:   Glucose, UA >=500 (*)    Hgb urine dipstick MODERATE (*)    Ketones, ur 20 (*)    All other components within normal limits  D-DIMER, QUANTITATIVE (NOT AT [facility]) - Abnormal; Notable for the following components:   D-Dimer, Quant 6.86 (*)    All other components within normal limits  LACTIC ACID, PLASMA - Abnormal; Notable for the following components:   Lactic  Acid, Venous 7.9 (*)    All other components within normal limits  BRAIN NATRIURETIC PEPTIDE - Abnormal; Notable for the following components:   B Natriuretic Peptide 1,918.7 (*)    All other components within normal limits  HEMOGLOBIN A1C - Abnormal; Notable for the following components:   Hgb A1c MFr Bld 12.4 (*)    All other components within normal limits  PROTIME-INR - Abnormal; Notable for the following components:   Prothrombin Time 23.7 (*)    INR 2.2 (*)    All other components within normal limits  CORTISOL-AM, BLOOD - Abnormal; Notable for the following components:   Cortisol - AM 30.2 (*)    All other components within normal limits  LACTIC ACID, PLASMA - Abnormal; Notable for the following components:   Lactic Acid, Venous 3.8 (*)    All other components within normal limits  GLUCOSE, CAPILLARY - Abnormal; Notable for the following components:   Glucose-Capillary 258 (*)    All other components within normal limits  GLUCOSE, CAPILLARY - Abnormal; Notable for the following components:   Glucose-Capillary 270 (*)    All other components within normal limits  BLOOD GAS, ARTERIAL - Abnormal; Notable for the following components:   pH, Arterial 7.484 (*)    pCO2 arterial 25.6 (*)    Bicarbonate 19.1 (*)    Acid-base deficit 3.9 (*)    All other components within normal limits  C-REACTIVE PROTEIN - Abnormal; Notable for the following components:   CRP 29.9 (*)    All other components within normal limits  CBC WITH DIFFERENTIAL/PLATELET - Abnormal; Notable for the following components:   WBC 14.7 (*)    RDW 16.3 (*)    Neutro Abs 12.9 (*)    All other components within normal limits  COMPREHENSIVE METABOLIC PANEL - Abnormal; Notable for the following components:   CO2 19 (*)    Glucose, Bld 157 (*)    Creatinine, Ser 1.53 (*)    Calcium 8.8 (*)    Albumin 2.6 (*)    Total Bilirubin 1.6 (*)    GFR calc non Af Amer 49 (*)    GFR calc Af Amer 56 (*)    All other  components within normal limits  FERRITIN - Abnormal; Notable for the following components:   Ferritin 557 (*)    All other components within normal limits  PHOSPHORUS - Abnormal; Notable for the following components:   Phosphorus 2.3 (*)    All other components within normal limits  GLUCOSE, CAPILLARY - Abnormal; Notable for the following components:   Glucose-Capillary 237 (*)    All other components within normal limits  GLUCOSE, CAPILLARY - Abnormal; Notable for the following components:   Glucose-Capillary 222 (*)    All other components within normal limits  D-DIMER, QUANTITATIVE (NOT AT [facility]) - Abnormal; Notable for the following components:   D-Dimer, Quant 3.62 (*)    All other components within normal limits  C-REACTIVE PROTEIN - Abnormal; Notable for the following components:   CRP 28.7 (*)    All other components within normal limits  CBC WITH DIFFERENTIAL/PLATELET - Abnormal; Notable for the following components:   WBC 12.8 (*)    RDW 16.3 (*)    Neutro Abs 11.2 (*)  Abs Immature Granulocytes 0.08 (*)    All other components within normal limits  COMPREHENSIVE METABOLIC PANEL - Abnormal; Notable for the following components:   CO2 17 (*)    Glucose, Bld 221 (*)    BUN 30 (*)    Creatinine, Ser 1.83 (*)    Calcium 8.6 (*)    Total Protein 5.6 (*)    Albumin 2.3 (*)    AST 81 (*)    Total Bilirubin 1.4 (*)    GFR calc non Af Amer 39 (*)    GFR calc Af Amer 45 (*)    Anion gap 18 (*)    All other components within normal limits  FERRITIN - Abnormal; Notable for the following components:   Ferritin 1,224 (*)    All other components within normal limits  BRAIN NATRIURETIC PEPTIDE - Abnormal; Notable for the following components:   B Natriuretic Peptide 701.0 (*)    All other components within normal limits  GLUCOSE, CAPILLARY - Abnormal; Notable for the following components:   Glucose-Capillary 142 (*)    All other components within normal limits  GLUCOSE,  CAPILLARY - Abnormal; Notable for the following components:   Glucose-Capillary 48 (*)    All other components within normal limits  GLUCOSE, CAPILLARY - Abnormal; Notable for the following components:   Glucose-Capillary 68 (*)    All other components within normal limits  HEPARIN LEVEL (UNFRACTIONATED) - Abnormal; Notable for the following components:   Heparin Unfractionated 0.78 (*)    All other components within normal limits  GLUCOSE, CAPILLARY - Abnormal; Notable for the following components:   Glucose-Capillary 251 (*)    All other components within normal limits  GLUCOSE, CAPILLARY - Abnormal; Notable for the following components:   Glucose-Capillary 367 (*)    All other components within normal limits  HEPARIN LEVEL (UNFRACTIONATED) - Abnormal; Notable for the following components:   Heparin Unfractionated 0.96 (*)    All other components within normal limits  D-DIMER, QUANTITATIVE (NOT AT [facility]) - Abnormal; Notable for the following components:   D-Dimer, Quant 5.09 (*)    All other components within normal limits  C-REACTIVE PROTEIN - Abnormal; Notable for the following components:   CRP 18.6 (*)    All other components within normal limits  CBC WITH DIFFERENTIAL/PLATELET - Abnormal; Notable for the following components:   WBC 13.2 (*)    RDW 15.9 (*)    Neutro Abs 11.2 (*)    Abs Immature Granulocytes 0.17 (*)    All other components within normal limits  COMPREHENSIVE METABOLIC PANEL - Abnormal; Notable for the following components:   BUN 25 (*)    Creatinine, Ser 1.36 (*)    Calcium 8.5 (*)    Albumin 2.3 (*)    AST 43 (*)    Total Bilirubin 1.6 (*)    GFR calc non Af Amer 56 (*)    All other components within normal limits  FERRITIN - Abnormal; Notable for the following components:   Ferritin 1,060 (*)    All other components within normal limits  GLUCOSE, CAPILLARY - Abnormal; Notable for the following components:   Glucose-Capillary 188 (*)    All other  components within normal limits  GLUCOSE, CAPILLARY - Abnormal; Notable for the following components:   Glucose-Capillary 283 (*)    All other components within normal limits  D-DIMER, QUANTITATIVE (NOT AT [facility]) - Abnormal; Notable for the following components:   D-Dimer, Quant 3.86 (*)    All  other components within normal limits  C-REACTIVE PROTEIN - Abnormal; Notable for the following components:   CRP 8.5 (*)    All other components within normal limits  CBC WITH DIFFERENTIAL/PLATELET - Abnormal; Notable for the following components:   Neutro Abs 8.4 (*)    All other components within normal limits  COMPREHENSIVE METABOLIC PANEL - Abnormal; Notable for the following components:   Sodium 134 (*)    Chloride 97 (*)    Glucose, Bld 345 (*)    BUN 31 (*)    Creatinine, Ser 1.57 (*)    Calcium 8.2 (*)    Total Protein 5.9 (*)    Albumin 2.0 (*)    GFR calc non Af Amer 47 (*)    GFR calc Af Amer 55 (*)    All other components within normal limits  FERRITIN - Abnormal; Notable for the following components:   Ferritin 712 (*)    All other components within normal limits  GLUCOSE, CAPILLARY - Abnormal; Notable for the following components:   Glucose-Capillary 331 (*)    All other components within normal limits  GLUCOSE, CAPILLARY - Abnormal; Notable for the following components:   Glucose-Capillary 309 (*)    All other components within normal limits  GLUCOSE, CAPILLARY - Abnormal; Notable for the following components:   Glucose-Capillary 364 (*)    All other components within normal limits  GLUCOSE, CAPILLARY - Abnormal; Notable for the following components:   Glucose-Capillary 223 (*)    All other components within normal limits  D-DIMER, QUANTITATIVE (NOT AT [facility]) - Abnormal; Notable for the following components:   D-Dimer, Quant 3.94 (*)    All other components within normal limits  C-REACTIVE PROTEIN - Abnormal; Notable for the following components:   CRP 4.7 (*)    All  other components within normal limits  CBC WITH DIFFERENTIAL/PLATELET - Abnormal; Notable for the following components:   WBC 12.3 (*)    Platelets 420 (*)    Neutro Abs 10.6 (*)    Abs Immature Granulocytes 0.10 (*)    All other components within normal limits  COMPREHENSIVE METABOLIC PANEL - Abnormal; Notable for the following components:   Glucose, Bld 243 (*)    BUN 35 (*)    Creatinine, Ser 1.50 (*)    Calcium 8.2 (*)    Total Protein 5.9 (*)    Albumin 2.1 (*)    GFR calc non Af Amer 50 (*)    GFR calc Af Amer 58 (*)    All other components within normal limits  FERRITIN - Abnormal; Notable for the following components:   Ferritin 627 (*)    All other components within normal limits  GLUCOSE, CAPILLARY - Abnormal; Notable for the following components:   Glucose-Capillary 147 (*)    All other components within normal limits  GLUCOSE, CAPILLARY - Abnormal; Notable for the following components:   Glucose-Capillary 223 (*)    All other components within normal limits  GLUCOSE, CAPILLARY - Abnormal; Notable for the following components:   Glucose-Capillary 226 (*)    All other components within normal limits  GLUCOSE, CAPILLARY - Abnormal; Notable for the following components:   Glucose-Capillary 304 (*)    All other components within normal limits  CBC - Abnormal; Notable for the following components:   WBC 13.8 (*)    Platelets 428 (*)    All other components within normal limits  COMPREHENSIVE METABOLIC PANEL - Abnormal; Notable for the following components:  Sodium 132 (*)    Chloride 95 (*)    Glucose, Bld 272 (*)    BUN 31 (*)    Creatinine, Ser 1.68 (*)    Calcium 8.1 (*)    Total Protein 6.1 (*)    Albumin 2.2 (*)    GFR calc non Af Amer 43 (*)    GFR calc Af Amer 50 (*)    All other components within normal limits  C-REACTIVE PROTEIN - Abnormal; Notable for the following components:   CRP 1.9 (*)    All other components within normal limits  GLUCOSE,  CAPILLARY - Abnormal; Notable for the following components:   Glucose-Capillary 132 (*)    All other components within normal limits  GLUCOSE, CAPILLARY - Abnormal; Notable for the following components:   Glucose-Capillary 207 (*)    All other components within normal limits  GLUCOSE, CAPILLARY - Abnormal; Notable for the following components:   Glucose-Capillary 211 (*)    All other components within normal limits  GLUCOSE, CAPILLARY - Abnormal; Notable for the following components:   Glucose-Capillary 114 (*)    All other components within normal limits  POCT I-STAT 7, (LYTES, BLD GAS, ICA,H+H) - Abnormal; Notable for the following components:   pCO2 arterial 22.9 (*)    Bicarbonate 15.2 (*)    TCO2 16 (*)    Acid-base deficit 7.0 (*)    Sodium 134 (*)    All other components within normal limits  CBG MONITORING, ED - Abnormal; Notable for the following components:   Glucose-Capillary 383 (*)    All other components within normal limits  TROPONIN I (HIGH SENSITIVITY) - Abnormal; Notable for the following components:   Troponin I (High Sensitivity) 274 (*)    All other components within normal limits  TROPONIN I (HIGH SENSITIVITY) - Abnormal; Notable for the following components:   Troponin I (High Sensitivity) 211 (*)    All other components within normal limits  TROPONIN I (HIGH SENSITIVITY) - Abnormal; Notable for the following components:   Troponin I (High Sensitivity) 275 (*)    All other components within normal limits  URINE CULTURE  CULTURE, BLOOD (ROUTINE X 2)  CULTURE, BLOOD (ROUTINE X 2)  PROCALCITONIN  APTT  HEPARIN LEVEL (UNFRACTIONATED)  HIV ANTIBODY (ROUTINE TESTING W REFLEX)  PROCALCITONIN  MAGNESIUM  MAGNESIUM  PHOSPHORUS  PROCALCITONIN  GLUCOSE, CAPILLARY  MAGNESIUM  PHOSPHORUS  PROCALCITONIN  GLUCOSE, CAPILLARY  GLUCOSE, CAPILLARY  URIC ACID  MAGNESIUM  I-STAT ARTERIAL BLOOD GAS, ED  I-STAT ARTERIAL BLOOD GAS, ED  ABO/RH    EKG EKG  Interpretation  Date/Time:  Thursday Aug 11 2019 14:52:44 EDT Ventricular Rate:  128 PR Interval:    QRS Duration: 83 QT Interval:  320 QTC Calculation: 467 R Axis:   45 Text Interpretation: Sinus tachycardia Probable left atrial enlargement Borderline low voltage, extremity leads Since last tracing Rate faster Confirmed by Calvert Cantor [phone]) on 08/12/2019 9:38:40 AM   Radiology No results found.  Procedures .Critical Care Performed by: Corena Herter, PA-C Authorized by: Corena Herter, PA-C   Critical care provider statement:    Critical care time (minutes):  60   Critical care was necessary to treat or prevent imminent or life-threatening deterioration of the following conditions:  Respiratory failure   Critical care was time spent personally by me on the following activities:  Discussions with consultants, evaluation of patient's response to treatment, examination of patient, ordering and performing treatments and interventions, ordering and review of  laboratory studies, ordering and review of radiographic studies, pulse oximetry, re-evaluation of patient's condition, obtaining history from patient or surrogate and review of old charts   (including critical care time)  Medications Ordered in ED Medications  albuterol (VENTOLIN HFA) 108 (90 Base) MCG/ACT inhaler 2 puff (2 puffs Inhalation Given 08/12/19 2138)  guaiFENesin-dextromethorphan (ROBITUSSIN DM) 100-10 MG/5ML syrup 10 mL (has no administration in time range)  chlorpheniramine-HYDROcodone (TUSSIONEX) 10-8 MG/5ML suspension 5 mL (5 mLs Oral Given 08/12/19 0341)  polyethylene glycol (MIRALAX / GLYCOLAX) packet 17 g (has no administration in time range)  acetaminophen (TYLENOL) tablet 650 mg (650 mg Oral Given 08/14/19 0016)    Or  acetaminophen (TYLENOL) suppository 650 mg ( Rectal See Alternative 08/14/19 0016)  ondansetron (ZOFRAN) tablet 4 mg (has no administration in time range)    Or  ondansetron (ZOFRAN)  injection 4 mg (has no administration in time range)  pneumococcal 23 valent vaccine (PNEUMOVAX-23) injection 0.5 mL (0 mLs Intramuscular Hold 08/13/19 1029)  insulin aspart (novoLOG) injection 0-20 Units (0 Units Subcutaneous Not Given 08/17/19 1557)  insulin aspart (novoLOG) injection 0-5 Units (0 Units Subcutaneous Not Given 08/16/19 2112)  ceFAZolin (ANCEF) IVPB 2g/100 mL premix (2 g Intravenous New Bag/Given 08/17/19 0804)  insulin glargine (LANTUS) injection 32 Units (32 Units Subcutaneous Given 08/16/19 2111)  insulin aspart (novoLOG) injection 6 Units (6 Units Subcutaneous Given 08/17/19 1558)  carvedilol (COREG) tablet 3.125 mg (3.125 mg Oral Given 08/17/19 1603)  gabapentin (NEURONTIN) capsule 100 mg (100 mg Oral Given 08/17/19 1603)  apixaban (ELIQUIS) tablet 10 mg (10 mg Oral Given 08/17/19 0758)    Followed by  apixaban (ELIQUIS) tablet 5 mg (has no administration in time range)  acetaminophen (TYLENOL) tablet 1,000 mg (1,000 mg Oral Given 08/11/19 1538)  cefTRIAXone (ROCEPHIN) 1 g in sodium chloride 0.9 % 100 mL IVPB (0 g Intravenous Stopped 08/11/19 1756)  azithromycin (ZITHROMAX) 500 mg in sodium chloride 0.9 % 250 mL IVPB (0 mg Intravenous Stopped 08/11/19 2054)  lactated ringers bolus 1,000 mL (0 mLs Intravenous Stopped 08/11/19 2000)    And  lactated ringers bolus 1,000 mL (0 mLs Intravenous Stopped 08/11/19 2326)  iohexol (OMNIPAQUE) 350 MG/ML injection 100 mL (100 mLs Intravenous Contrast Given 08/11/19 2227)  remdesivir 200 mg in sodium chloride 0.9% 250 mL IVPB (0 mg Intravenous Stopped 08/12/19 0219)    Followed by  remdesivir 100 mg in sodium chloride 0.9 % 100 mL IVPB (100 mg Intravenous New Bag/Given 08/16/19 0813)  enoxaparin (LOVENOX) injection 120 mg (120 mg Subcutaneous Given 08/12/19 0127)  furosemide (LASIX) injection 60 mg (60 mg Intravenous Given 08/12/19 1233)  furosemide (LASIX) injection 60 mg (60 mg Intravenous Given 08/12/19 1755)  ceFAZolin (ANCEF) IVPB 2g/100 mL premix  (0 g Intravenous Stopped 08/12/19 2218)  magnesium sulfate IVPB 1 g 100 mL (1 g Intravenous New Bag/Given 08/15/19 1239)  potassium chloride SA (KLOR-CON) CR tablet 40 mEq (40 mEq Oral Given 08/15/19 1250)  magnesium sulfate IVPB 1 g 100 mL (1 g Intravenous New Bag/Given 08/16/19 1144)    ED Course  I have reviewed the triage vital signs and the nursing notes.  Pertinent labs & imaging results that were available during my care of the patient were reviewed by me and considered in my medical decision making (see chart for details).  Clinical Course as of Aug 17 1602  Thu Aug 11, 2019  2240 Spoke with radiologist regarding CTA findings.  Will start heparin per pharm consult.   [GG]  2319  Spoke with hospitalist, Dr. Greggory Stallion, who recommended ABG but otherwise will see and admit patient.    [GG]    Clinical Course User Index [GG] Lorelee New, PA-C   MDM Rules/Calculators/A&P                      I personally reviewed DG chest obtained which demonstrates focal opacity in the right midlung concerning for pneumonia.  He also has patchy interstitial opacities in the bases of the lungs bilaterally, potentially reflective of his COVID-19 diagnosis.  Patient's lab work demonstrates a leukocytosis to 19.9 with a left shift.  Will start empirically on Rocephin and Zithromax to cover for CAP while remainder of labs and work-up pends.    On repeat examination, patient is mildly improved.  He is breathing better and speaking in full sentences.  He has been removed from the NRB and placed on 10 L high flow O2 via [place].  Patient's lactic acid came back elevated to 4.1, will start on IVF.  Ultimately plan is to admit patient for hypoxia in the setting of pneumonia.  Patient had an episode of hemoptysis here in the ED.  That in conjunction with his elevated D-dimer of 11.45, reasonable to obtain CTA to assess for pulmonary embolism as the cause of his new onset hypoxia.  I still cannot reach his spouse, Bonita Quin.     CTA was personally reviewed and demonstrates pulmonary artery emboli in the posterior segment right upper lobe in addition to multifocal areas of consolidation concerning for infection.  There is also a dense 7.7 x 8.2 cm masslike consolidation in the posterior segment of the right upper lobe concerning for malignancy.  Will consult pharmacy for heparin dosing and admit to step-down unit given these findings and hypoxia and oxygen requirement.  Spoke with hospitalist who recommended ABG but otherwise will see and admit patient.   Final Clinical Impression(s) / ED Diagnoses Final diagnoses:  Acute pulmonary embolism without acute cor pulmonale, unspecified pulmonary embolism type [facility])  Hypoxia    Rx / DC Orders ED Discharge Orders    None       Lorelee New, PA-C 08/11/19 2321    Lorelee New, PA-C 08/17/19 1604    Gerhard Munch, MD 08/17/19 2128

## 2019-08-11 NOTE — Progress Notes (Signed)
ANTICOAGULATION CONSULT NOTE - Initial Consult  Pharmacy Consult for HEPARIN Indication: pulmonary embolus in setting of Covid PNA  Allergies  Allergen Reactions  . Celebrex [Celecoxib] Swelling    Swelling in face  . Entresto [Sacubitril-Valsartan] Other (See Comments)    Dizziness    Patient Measurements: Height: 5\' 5"  (165.1 cm) Weight: 118.7 kg (261 lb 11 oz) IBW/kg (Calculated) : 61.5 Heparin Dosing Weight: 90kg  Vital Signs: Temp: 98 F (36.7 C) (05/13 1756) Temp Source: Oral (05/13 1756) BP: 138/95 (05/13 2130) Pulse Rate: 104 (05/13 2109)  Labs: Recent Labs    08/11/19 1614  HGB 15.8  HCT 51.4  PLT 345  APTT 24  LABPROT 19.0*  INR 1.7*  CREATININE 1.65*    Estimated Creatinine Clearance: 56.8 mL/min (A) (by C-G formula based on SCr of 1.65 mg/dL (H)).   Medical History: Past Medical History:  Diagnosis Date  . Asthma   . Diabetes mellitus, type 2 (HCC)   . Hypercholesterolemia   . Hypertension   . Obesity   . OSA (obstructive sleep apnea)   . Other primary cardiomyopathies     Assessment: 59yo male tested positive for Covid-19 6d ago, now c/o respiratory distress and confusion, CT reveals PE and Covid PNA, to begin heparin.  Goal of Therapy:  Heparin level 0.3-0.7 units/ml Monitor platelets by anticoagulation protocol: Yes   Plan:  Will give heparin 5000 units IV bolus x1 followed by gtt at 1600 units/hr and monitor heparin levels and CBC.  59yo, PharmD, BCPS  08/11/2019,10:55 PM

## 2019-08-11 NOTE — ED Notes (Signed)
Found pt unattached to all monitoring equipment and very confused.  O2 was also out of nose.  RN reapplied monitoring equipment and O2, which increased to 94%.  Pt educated on importance of monitoring equipment.    Pt states he is claustrophobic and having difficulties staying in the room.  RN attempted to place monitoring equipment so that he does not feel "tied down to bed."

## 2019-08-11 NOTE — Progress Notes (Signed)
Notified bedside nurse of need to order another lactic acid due to 2nd lactic being higher than first.

## 2019-08-11 NOTE — ED Notes (Signed)
Pt to CT w/ provider

## 2019-08-11 NOTE — ED Notes (Signed)
Pt has changed mind about CT - will try

## 2019-08-11 NOTE — ED Notes (Signed)
Called CT, asked about ETA for CT.  Should be shortly

## 2019-08-11 NOTE — Sepsis Progress Note (Signed)
Notified bedside nurse of need to administer antibiotics.  

## 2019-08-11 NOTE — ED Notes (Signed)
Pt is very concerned about CT - refusing CT.  Provider aware.

## 2019-08-11 NOTE — ED Triage Notes (Addendum)
From home via ems; diagnosed with Covid 6 days ago; called out for reps distress, increased confusion; co2 16, RR 40, HR 130, BP 130/70, 72% RA, placed on NRB, 90%

## 2019-08-12 ENCOUNTER — Encounter (HOSPITAL_COMMUNITY): Payer: Self-pay | Admitting: Internal Medicine

## 2019-08-12 ENCOUNTER — Inpatient Hospital Stay (HOSPITAL_COMMUNITY): Payer: Medicaid Other

## 2019-08-12 ENCOUNTER — Emergency Department (HOSPITAL_COMMUNITY): Payer: Medicaid Other

## 2019-08-12 DIAGNOSIS — R918 Other nonspecific abnormal finding of lung field: Secondary | ICD-10-CM | POA: Diagnosis present

## 2019-08-12 DIAGNOSIS — Z66 Do not resuscitate: Secondary | ICD-10-CM | POA: Diagnosis present

## 2019-08-12 DIAGNOSIS — J851 Abscess of lung with pneumonia: Secondary | ICD-10-CM | POA: Diagnosis present

## 2019-08-12 DIAGNOSIS — I420 Dilated cardiomyopathy: Secondary | ICD-10-CM | POA: Diagnosis present

## 2019-08-12 DIAGNOSIS — J45909 Unspecified asthma, uncomplicated: Secondary | ICD-10-CM | POA: Diagnosis present

## 2019-08-12 DIAGNOSIS — I5043 Acute on chronic combined systolic (congestive) and diastolic (congestive) heart failure: Secondary | ICD-10-CM | POA: Diagnosis present

## 2019-08-12 DIAGNOSIS — J9601 Acute respiratory failure with hypoxia: Secondary | ICD-10-CM | POA: Diagnosis present

## 2019-08-12 DIAGNOSIS — I5042 Chronic combined systolic (congestive) and diastolic (congestive) heart failure: Secondary | ICD-10-CM

## 2019-08-12 DIAGNOSIS — Z794 Long term (current) use of insulin: Secondary | ICD-10-CM

## 2019-08-12 DIAGNOSIS — A4189 Other specified sepsis: Principal | ICD-10-CM

## 2019-08-12 DIAGNOSIS — M7989 Other specified soft tissue disorders: Secondary | ICD-10-CM | POA: Diagnosis not present

## 2019-08-12 DIAGNOSIS — E872 Acidosis, unspecified: Secondary | ICD-10-CM | POA: Diagnosis present

## 2019-08-12 DIAGNOSIS — G4733 Obstructive sleep apnea (adult) (pediatric): Secondary | ICD-10-CM | POA: Diagnosis present

## 2019-08-12 DIAGNOSIS — N179 Acute kidney failure, unspecified: Secondary | ICD-10-CM | POA: Diagnosis present

## 2019-08-12 DIAGNOSIS — R945 Abnormal results of liver function studies: Secondary | ICD-10-CM | POA: Diagnosis not present

## 2019-08-12 DIAGNOSIS — Z888 Allergy status to other drugs, medicaments and biological substances status: Secondary | ICD-10-CM | POA: Diagnosis not present

## 2019-08-12 DIAGNOSIS — I2699 Other pulmonary embolism without acute cor pulmonale: Secondary | ICD-10-CM | POA: Diagnosis present

## 2019-08-12 DIAGNOSIS — U071 COVID-19: Secondary | ICD-10-CM

## 2019-08-12 DIAGNOSIS — I2693 Single subsegmental pulmonary embolism without acute cor pulmonale: Secondary | ICD-10-CM | POA: Diagnosis not present

## 2019-08-12 DIAGNOSIS — H8110 Benign paroxysmal vertigo, unspecified ear: Secondary | ICD-10-CM | POA: Diagnosis present

## 2019-08-12 DIAGNOSIS — I110 Hypertensive heart disease with heart failure: Secondary | ICD-10-CM | POA: Diagnosis present

## 2019-08-12 DIAGNOSIS — R7881 Bacteremia: Secondary | ICD-10-CM | POA: Diagnosis not present

## 2019-08-12 DIAGNOSIS — A0839 Other viral enteritis: Secondary | ICD-10-CM | POA: Diagnosis present

## 2019-08-12 DIAGNOSIS — J15211 Pneumonia due to Methicillin susceptible Staphylococcus aureus: Secondary | ICD-10-CM | POA: Diagnosis not present

## 2019-08-12 DIAGNOSIS — G9341 Metabolic encephalopathy: Secondary | ICD-10-CM | POA: Diagnosis present

## 2019-08-12 DIAGNOSIS — E7800 Pure hypercholesterolemia, unspecified: Secondary | ICD-10-CM | POA: Diagnosis present

## 2019-08-12 DIAGNOSIS — E1165 Type 2 diabetes mellitus with hyperglycemia: Secondary | ICD-10-CM | POA: Diagnosis present

## 2019-08-12 DIAGNOSIS — J1282 Pneumonia due to coronavirus disease 2019: Secondary | ICD-10-CM | POA: Diagnosis present

## 2019-08-12 DIAGNOSIS — Z87891 Personal history of nicotine dependence: Secondary | ICD-10-CM | POA: Diagnosis not present

## 2019-08-12 DIAGNOSIS — I10 Essential (primary) hypertension: Secondary | ICD-10-CM | POA: Diagnosis not present

## 2019-08-12 DIAGNOSIS — R590 Localized enlarged lymph nodes: Secondary | ICD-10-CM | POA: Diagnosis present

## 2019-08-12 DIAGNOSIS — B9561 Methicillin susceptible Staphylococcus aureus infection as the cause of diseases classified elsewhere: Secondary | ICD-10-CM | POA: Diagnosis present

## 2019-08-12 DIAGNOSIS — E669 Obesity, unspecified: Secondary | ICD-10-CM | POA: Diagnosis present

## 2019-08-12 HISTORY — DX: Other pulmonary embolism without acute cor pulmonale: I26.99

## 2019-08-12 HISTORY — DX: Other nonspecific abnormal finding of lung field: R91.8

## 2019-08-12 LAB — BLOOD GAS, ARTERIAL
Acid-base deficit: 3.9 mmol/L — ABNORMAL HIGH (ref 0.0–2.0)
Bicarbonate: 19.1 mmol/L — ABNORMAL LOW (ref 20.0–28.0)
FIO2: 52
O2 Saturation: 97.6 %
Patient temperature: 36.5
pCO2 arterial: 25.6 mmHg — ABNORMAL LOW (ref 32.0–48.0)
pH, Arterial: 7.484 — ABNORMAL HIGH (ref 7.350–7.450)
pO2, Arterial: 83.8 mmHg (ref 83.0–108.0)

## 2019-08-12 LAB — BLOOD CULTURE ID PANEL (REFLEXED)

## 2019-08-12 LAB — CBC WITH DIFFERENTIAL/PLATELET
Abs Immature Granulocytes: 0 10*3/uL (ref 0.00–0.07)
Basophils Absolute: 0 10*3/uL (ref 0.0–0.1)
Basophils Relative: 0 %
Eosinophils Absolute: 0 10*3/uL (ref 0.0–0.5)
Eosinophils Relative: 0 %
HCT: 48.4 % (ref 39.0–52.0)
Hemoglobin: 15.3 g/dL (ref 13.0–17.0)
Lymphocytes Relative: 8 %
Lymphs Abs: 1.2 10*3/uL (ref 0.7–4.0)
MCH: 26.9 pg (ref 26.0–34.0)
MCHC: 31.6 g/dL (ref 30.0–36.0)
MCV: 85.1 fL (ref 80.0–100.0)
Monocytes Absolute: 0.6 10*3/uL (ref 0.1–1.0)
Monocytes Relative: 4 %
Neutro Abs: 12.9 10*3/uL — ABNORMAL HIGH (ref 1.7–7.7)
Neutrophils Relative %: 88 %
Platelets: 297 10*3/uL (ref 150–400)
RBC: 5.69 MIL/uL (ref 4.22–5.81)
RDW: 16.3 % — ABNORMAL HIGH (ref 11.5–15.5)
WBC: 14.7 10*3/uL — ABNORMAL HIGH (ref 4.0–10.5)
nRBC: 0 % (ref 0.0–0.2)
nRBC: 0 /100 WBC

## 2019-08-12 LAB — PROTIME-INR
INR: 2.2 — ABNORMAL HIGH (ref 0.8–1.2)
Prothrombin Time: 23.7 seconds — ABNORMAL HIGH (ref 11.4–15.2)

## 2019-08-12 LAB — ECHOCARDIOGRAM COMPLETE
Height: 66 in
Weight: 3724.89 oz

## 2019-08-12 LAB — COMPREHENSIVE METABOLIC PANEL
ALT: 18 U/L (ref 0–44)
AST: 24 U/L (ref 15–41)
Albumin: 2.6 g/dL — ABNORMAL LOW (ref 3.5–5.0)
Alkaline Phosphatase: 63 U/L (ref 38–126)
Anion gap: 14 (ref 5–15)
BUN: 14 mg/dL (ref 6–20)
CO2: 19 mmol/L — ABNORMAL LOW (ref 22–32)
Calcium: 8.8 mg/dL — ABNORMAL LOW (ref 8.9–10.3)
Chloride: 107 mmol/L (ref 98–111)
Creatinine, Ser: 1.53 mg/dL — ABNORMAL HIGH (ref 0.61–1.24)
GFR calc Af Amer: 56 mL/min — ABNORMAL LOW (ref >60)
GFR calc non Af Amer: 49 mL/min — ABNORMAL LOW (ref >60)
Glucose, Bld: 157 mg/dL — ABNORMAL HIGH (ref 70–99)
Potassium: 4.5 mmol/L (ref 3.5–5.1)
Sodium: 140 mmol/L (ref 135–145)
Total Bilirubin: 1.6 mg/dL — ABNORMAL HIGH (ref 0.3–1.2)
Total Protein: 6.6 g/dL (ref 6.5–8.1)

## 2019-08-12 LAB — HEPARIN LEVEL (UNFRACTIONATED): Heparin Unfractionated: 0.62 IU/mL (ref 0.30–0.70)

## 2019-08-12 LAB — CORTISOL-AM, BLOOD: Cortisol - AM: 30.2 ug/dL — ABNORMAL HIGH (ref 6.7–22.6)

## 2019-08-12 LAB — PROCALCITONIN: Procalcitonin: 6.16 ng/mL

## 2019-08-12 LAB — ABO/RH: ABO/RH(D): O POS

## 2019-08-12 LAB — HIV ANTIBODY (ROUTINE TESTING W REFLEX): HIV Screen 4th Generation wRfx: NONREACTIVE

## 2019-08-12 LAB — MAGNESIUM: Magnesium: 1.9 mg/dL (ref 1.7–2.4)

## 2019-08-12 LAB — GLUCOSE, CAPILLARY
Glucose-Capillary: 258 mg/dL — ABNORMAL HIGH (ref 70–99)
Glucose-Capillary: 270 mg/dL — ABNORMAL HIGH (ref 70–99)
Glucose-Capillary: 237 mg/dL — ABNORMAL HIGH (ref 70–99)
Glucose-Capillary: 222 mg/dL — ABNORMAL HIGH (ref 70–99)
Glucose-Capillary: 142 mg/dL — ABNORMAL HIGH (ref 70–99)

## 2019-08-12 LAB — C-REACTIVE PROTEIN: CRP: 29.9 mg/dL — ABNORMAL HIGH (ref <1.0)

## 2019-08-12 LAB — TROPONIN I (HIGH SENSITIVITY)
Troponin I (High Sensitivity): 274 ng/L (ref <18)
Troponin I (High Sensitivity): 211 ng/L (ref <18)
Troponin I (High Sensitivity): 275 ng/L (ref <18)

## 2019-08-12 LAB — HEMOGLOBIN A1C
Hgb A1c MFr Bld: 12.4 % — ABNORMAL HIGH (ref 4.8–5.6)
Mean Plasma Glucose: 309.18 mg/dL

## 2019-08-12 LAB — D-DIMER, QUANTITATIVE: D-Dimer, Quant: 6.86 ug/mL-FEU — ABNORMAL HIGH (ref 0.00–0.50)

## 2019-08-12 LAB — FERRITIN: Ferritin: 557 ng/mL — ABNORMAL HIGH (ref 24–336)

## 2019-08-12 LAB — CBG MONITORING, ED: Glucose-Capillary: 383 mg/dL — ABNORMAL HIGH (ref 70–99)

## 2019-08-12 LAB — PHOSPHORUS: Phosphorus: 2.3 mg/dL — ABNORMAL LOW (ref 2.5–4.6)

## 2019-08-12 LAB — BRAIN NATRIURETIC PEPTIDE: B Natriuretic Peptide: 1918.7 pg/mL — ABNORMAL HIGH (ref 0.0–100.0)

## 2019-08-12 LAB — LACTIC ACID, PLASMA
Lactic Acid, Venous: 7.9 mmol/L (ref 0.5–1.9)
Lactic Acid, Venous: 3.8 mmol/L (ref 0.5–1.9)

## 2019-08-12 MED ORDER — ENOXAPARIN SODIUM 120 MG/0.8ML ~~LOC~~ SOLN
1.0000 mg/kg | SUBCUTANEOUS | Status: AC
  Administered 2019-08-12: 120 mg via SUBCUTANEOUS
  Filled 2019-08-12: qty 0.8

## 2019-08-12 MED ORDER — ONDANSETRON HCL 4 MG/2ML IJ SOLN: 4.0000 mg | Freq: Four times a day (QID) | INTRAMUSCULAR | Status: DC | PRN

## 2019-08-12 MED ORDER — FUROSEMIDE 10 MG/ML IJ SOLN: 40.0000 mg | Freq: Two times a day (BID) | INTRAMUSCULAR | Status: DC

## 2019-08-12 MED ORDER — INSULIN GLARGINE 100 UNIT/ML ~~LOC~~ SOLN
35.0000 [IU] | Freq: Every day | SUBCUTANEOUS | Status: DC
  Administered 2019-08-12: 35 [IU] via SUBCUTANEOUS
  Filled 2019-08-12 (×2): qty 0.35

## 2019-08-12 MED ORDER — INSULIN ASPART 100 UNIT/ML ~~LOC~~ SOLN
0.0000 [IU] | Freq: Every day | SUBCUTANEOUS | Status: DC
  Administered 2019-08-14: 4 [IU] via SUBCUTANEOUS

## 2019-08-12 MED ORDER — CARVEDILOL 12.5 MG PO TABS
12.5000 mg | ORAL_TABLET | Freq: Two times a day (BID) | ORAL | Status: DC
  Administered 2019-08-12: 12.5 mg via ORAL
  Filled 2019-08-12: qty 1

## 2019-08-12 MED ORDER — POLYETHYLENE GLYCOL 3350 17 G PO PACK: 17.0000 g | PACK | Freq: Every day | ORAL | Status: DC | PRN

## 2019-08-12 MED ORDER — FUROSEMIDE 10 MG/ML IJ SOLN
60.0000 mg | Freq: Once | INTRAMUSCULAR | Status: AC
  Administered 2019-08-12: 60 mg via INTRAVENOUS
  Filled 2019-08-12: qty 6

## 2019-08-12 MED ORDER — INSULIN GLARGINE 100 UNIT/ML ~~LOC~~ SOLN
42.0000 [IU] | Freq: Every day | SUBCUTANEOUS | Status: DC
  Administered 2019-08-12: 42 [IU] via SUBCUTANEOUS
  Filled 2019-08-12 (×3): qty 0.42

## 2019-08-12 MED ORDER — ONDANSETRON HCL 4 MG PO TABS: 4.0000 mg | ORAL_TABLET | Freq: Four times a day (QID) | ORAL | Status: DC | PRN

## 2019-08-12 MED ORDER — ACETAMINOPHEN 650 MG RE SUPP: 650.0000 mg | Freq: Four times a day (QID) | RECTAL | Status: DC | PRN

## 2019-08-12 MED ORDER — FUROSEMIDE 10 MG/ML IJ SOLN: 80.0000 mg | Freq: Once | INTRAMUSCULAR | Status: DC

## 2019-08-12 MED ORDER — ENOXAPARIN SODIUM 120 MG/0.8ML ~~LOC~~ SOLN
105.0000 mg | Freq: Two times a day (BID) | SUBCUTANEOUS | Status: DC
  Administered 2019-08-12 – 2019-08-15 (×6): 105 mg via SUBCUTANEOUS
  Filled 2019-08-12 (×7): qty 0.7

## 2019-08-12 MED ORDER — SODIUM CHLORIDE 0.9 % IV SOLN
500.0000 mg | INTRAVENOUS | Status: DC
  Administered 2019-08-12: 500 mg via INTRAVENOUS
  Filled 2019-08-12 (×2): qty 500

## 2019-08-12 MED ORDER — ACETAMINOPHEN 325 MG PO TABS
650.0000 mg | ORAL_TABLET | Freq: Four times a day (QID) | ORAL | Status: DC | PRN
  Administered 2019-08-12 – 2019-08-18 (×3): 650 mg via ORAL
  Filled 2019-08-12 (×3): qty 2

## 2019-08-12 MED ORDER — INSULIN ASPART 100 UNIT/ML ~~LOC~~ SOLN
0.0000 [IU] | Freq: Four times a day (QID) | SUBCUTANEOUS | Status: DC
  Administered 2019-08-12: 20 [IU] via SUBCUTANEOUS
  Administered 2019-08-12: 11 [IU] via SUBCUTANEOUS

## 2019-08-12 MED ORDER — PNEUMOCOCCAL VAC POLYVALENT 25 MCG/0.5ML IJ INJ
0.5000 mL | INJECTION | INTRAMUSCULAR | Status: DC
  Filled 2019-08-12: qty 0.5

## 2019-08-12 MED ORDER — INSULIN ASPART 100 UNIT/ML ~~LOC~~ SOLN
0.0000 [IU] | Freq: Three times a day (TID) | SUBCUTANEOUS | Status: DC
  Administered 2019-08-12 (×2): 7 [IU] via SUBCUTANEOUS
  Administered 2019-08-13: 20 [IU] via SUBCUTANEOUS
  Administered 2019-08-13: 11 [IU] via SUBCUTANEOUS
  Administered 2019-08-14: 4 [IU] via SUBCUTANEOUS
  Administered 2019-08-14: 11 [IU] via SUBCUTANEOUS
  Administered 2019-08-15: 7 [IU] via SUBCUTANEOUS
  Administered 2019-08-15: 20 [IU] via SUBCUTANEOUS
  Administered 2019-08-15: 15 [IU] via SUBCUTANEOUS
  Administered 2019-08-16 (×2): 7 [IU] via SUBCUTANEOUS
  Administered 2019-08-16: 15 [IU] via SUBCUTANEOUS
  Administered 2019-08-17 – 2019-08-19 (×4): 7 [IU] via SUBCUTANEOUS

## 2019-08-12 MED ORDER — INSULIN ASPART 100 UNIT/ML ~~LOC~~ SOLN
6.0000 [IU] | Freq: Three times a day (TID) | SUBCUTANEOUS | Status: DC
  Administered 2019-08-12 (×2): 6 [IU] via SUBCUTANEOUS

## 2019-08-12 MED ORDER — DEXAMETHASONE SODIUM PHOSPHATE 10 MG/ML IJ SOLN
10.0000 mg | INTRAMUSCULAR | Status: DC
  Administered 2019-08-13 – 2019-08-14 (×2): 10 mg via INTRAVENOUS
  Filled 2019-08-12 (×2): qty 1

## 2019-08-12 MED ORDER — CARVEDILOL 3.125 MG PO TABS: 3.1250 mg | ORAL_TABLET | Freq: Two times a day (BID) | ORAL | Status: DC

## 2019-08-12 MED ORDER — SODIUM CHLORIDE 0.9 % IV SOLN
3.0000 g | Freq: Four times a day (QID) | INTRAVENOUS | Status: DC
  Administered 2019-08-12 (×2): 3 g via INTRAVENOUS
  Filled 2019-08-12 (×5): qty 8

## 2019-08-12 MED ORDER — CEFAZOLIN SODIUM-DEXTROSE 2-4 GM/100ML-% IV SOLN
2.0000 g | Freq: Three times a day (TID) | INTRAVENOUS | Status: AC
  Administered 2019-08-12: 2 g via INTRAVENOUS
  Filled 2019-08-12: qty 100

## 2019-08-12 MED ORDER — ENOXAPARIN SODIUM 120 MG/0.8ML ~~LOC~~ SOLN
1.0000 mg/kg | Freq: Two times a day (BID) | SUBCUTANEOUS | Status: DC
  Filled 2019-08-12: qty 0.8

## 2019-08-12 MED ORDER — CEFAZOLIN SODIUM-DEXTROSE 2-4 GM/100ML-% IV SOLN
2.0000 g | Freq: Three times a day (TID) | INTRAVENOUS | Status: DC
  Administered 2019-08-13 – 2019-08-19 (×18): 2 g via INTRAVENOUS
  Filled 2019-08-12 (×22): qty 100

## 2019-08-12 MED ORDER — CARVEDILOL 6.25 MG PO TABS: 6.2500 mg | ORAL_TABLET | Freq: Two times a day (BID) | ORAL | Status: DC

## 2019-08-12 NOTE — Plan of Care (Signed)

## 2019-08-12 NOTE — Progress Notes (Signed)
DNR order verified and bracelet placed on patient with Achilles Dunk, RN.

## 2019-08-12 NOTE — Progress Notes (Signed)
PROGRESS NOTE                                                                                                                                                                                                             Patient Demographics:    Kevin Long, is a 59 y.o. male, DOB - 1961-03-04, NHA:[id number]  Outpatient Primary MD for the patient is Fleet Contras, MD   Admit date - 08/11/2019   LOS - 0  Chief Complaint  Patient presents with  . Sepsis alert, Covid pos       Brief Narrative: Patient is a 59 y.o. male with PMHx of chronic systolic heart failure, HTN, DM-2-  Significant Events: 4/29>> COVID-19 positive at [facility] (results available in care everywhere) 5/13>> admit to [facility] for hypoxia-CT chest with lung mass, postobstructive pneumonia, groundglass opacities and PE.  Significant studies 5/13>> CT chest: 7.7x8.2 masslike consolidation in the right upper lobe with surrounding groundglass/tree-in-bud nodularity reflecting postobstructive pneumonia, additional areas of multifocal/groundglass pneumonia, segmental/subsegmental PE in the right upper lobe.  COVID-19 medications: Steroids: 5/13>> Remdesivir: 5/13>>  Antibiotics: Unasyn: 5/14>> Zithromax: 5/13>> Ceftriaxone: 5/13 x 1  Microbiology data: 5/13: Blood culture>> pending 5/13: Urine culture>> pending  DVT prophylaxis: Therapeutic Lovenox  Procedures: None  Consults: None   Subjective:    Kelsen Keenan today is awake-slightly confused/lethargic and slow to respond.  He appears tachypneic with minimal activity but appears relatively comfortable with rest.   Assessment  & Plan :   Acute Hypoxic Resp Failure due to Covid 19 Viral pneumonia, postobstructive pneumonia and pulmonary embolism: Remains tenuous on 8 L of oxygen-awaiting a.m. labs-plans are to continue with Unasyn/Zithromax for CAP/postobstructive pneumonia-steroid/remdesivir for  COVID-19 pneumonia-and therapeutic Lovenox for pulmonary embolism.  If he deteriorates significantly-he will probably will require transfer to the ICU.  Given concern for bacterial pneumonia/elevated procalcitonin level not a candidate for Actemra. Given Lethargy-obtaining ABG to make sure he does not have hypercarbia.  Fever: afebrile Incentive Spirometry: encouraged incentive spirometry use 3-4/hour. O2 requirements:  SpO2: 95 % O2 Flow Rate (L/min): 8 L/min   COVID-19 Labs: Recent Labs    08/11/19 1614  DDIMER 11.45*  FERRITIN 469*  LDH 542*  CRP 15.6*       Component Value Date/Time   BNP 1,918.7 (H) 08/12/2019 0106    Recent Labs  Lab 08/11/19  1614  PROCALCITON 0.73    No results found for: SARSCOV2NAA   Acute on chronic systolic heart failure (EF 25-30% by TTE on 11/19/2016): Appears to have some mild pulmonary edema that is probably contributing to respiratory failure-he did receive approximately 2 L of IV fluid in the ED.  BP is soft-hence will decrease Coreg to 6.25 mg twice daily-awaiting repeat labs before starting Lasix-monitor intake/output, daily weights-volume status.  Will discuss with Dr. Gerald Dexter primary cardiologist.  Acute metabolic encephalopathy: Remains lethargic/slow to answer questions-await ABG to rule out hypercarbia-but suspect this is probably secondary to hypoxemia.  AKI: Likely hemodynamically mediated in the setting of COVID-19-UA negative for proteinuria-obtain renal ultrasound.  Awaiting repeat labs this morning.  Lung mass: Currently situation is very tenuous-with respiratory failure-we will treat underlying pneumonia/PE/CHF-if he stabilizes-at some point he will probably need either repeat imaging or bronchoscopy with biopsy.  He is a ex-smoker-quit 2009.  Elevated troponin: Probably demand ischemia in the setting of PE/hypoxemia-recheck troponin levels later this afternoon.  EKG nonacute.  Outpatient cardiology note-last [facility] in 2014  revealed normal coronary arteries.  HTN: BP soft-have been decreased Coreg to 6.25 mg twice daily-BiDil on hold.  Insulin-dependent DM-2 with uncontrolled hyperglycemia due to poor outpatient control worsened by steroids (A1c 12.4 on 08/12/2019): Increase Lantus to 42 units, add 6 units of NovoLog with meals-continue SSI.  Follow and optimize.  Recent Labs    08/12/19 0104 08/12/19 0318 08/12/19 0604  GLUCAP 383* 258* 270*   OSA: Claims that his machine broke several months ago-we will try to place him on CPAP nightly if he tolerates here in the hospital.  Morbid Obesity: Estimated body mass index is 37.58 kg/m as calculated from the following:   Height as of this encounter: 5\' 6"  (1.676 m).   Weight as of this encounter: 105.6 kg.    ABG:    Component Value Date/Time   PHART 7.429 08/11/2019 2349   PCO2ART 22.9 (L) 08/11/2019 2349   PO2ART 94 08/11/2019 2349   HCO3 15.2 (L) 08/11/2019 2349   TCO2 16 (L) 08/11/2019 2349   ACIDBASEDEF 7.0 (H) 08/11/2019 2349   O2SAT 98.0 08/11/2019 2349    Vent Settings: N/A   Condition - Extremely Guarded-very tenuous with risk for further deterioration  Family Communication  : Left a voicemail for significant other on 5/14  Code Status :  Full Code  Diet :  Diet Order            Diet clear liquid Room service appropriate? Yes; Fluid consistency: Thin  Diet effective now               Disposition Plan  :   Status is: Inpatient  Remains inpatient appropriate because:Inpatient level of care appropriate due to severity of illness  Dispo: The patient is from: Home              Anticipated d/c is to: Home              Anticipated d/c date is: > 3 days              Patient currently is not medically stable to d/c.   Barriers to discharge: Severe hypoxia requiring O2 supplementation-on IV antimicrobial therapy-AKI  Antimicorbials  :    Anti-infectives (From admission, onward)   Start     Dose/Rate Route Frequency Ordered  Stop   08/13/19 1000  remdesivir 100 mg in sodium chloride 0.9 % 100 mL IVPB  100 mg 200 mL/hr over 30 Minutes Intravenous Daily 08/11/19 2347 08/17/19 0959   08/12/19 1800  azithromycin (ZITHROMAX) 500 mg in sodium chloride 0.9 % 250 mL IVPB     500 mg 250 mL/hr over 60 Minutes Intravenous Every 24 hours 08/12/19 0709 08/14/19 1759   08/12/19 0800  Ampicillin-Sulbactam (UNASYN) 3 g in sodium chloride 0.9 % 100 mL IVPB     3 g 200 mL/hr over 30 Minutes Intravenous Every 6 hours 08/12/19 0730     08/12/19 0030  remdesivir 200 mg in sodium chloride 0.9% 250 mL IVPB     200 mg 580 mL/hr over 30 Minutes Intravenous Once 08/11/19 2347 08/12/19 0219   08/11/19 1700  cefTRIAXone (ROCEPHIN) 1 g in sodium chloride 0.9 % 100 mL IVPB     1 g 200 mL/hr over 30 Minutes Intravenous  Once 08/11/19 1649 08/11/19 1756   08/11/19 1700  azithromycin (ZITHROMAX) 500 mg in sodium chloride 0.9 % 250 mL IVPB     500 mg 250 mL/hr over 60 Minutes Intravenous  Once 08/11/19 1649 08/11/19 2054      Inpatient Medications  Scheduled Meds: . carvedilol  12.5 mg Oral BID WC  . dexamethasone (DECADRON) injection  6 mg Intravenous Q24H  . enoxaparin (LOVENOX) injection  105 mg Subcutaneous Q12H  . insulin aspart  0-20 Units Subcutaneous Q6H  . insulin glargine  35 Units Subcutaneous QHS  . [START ON 08/13/2019] pneumococcal 23 valent vaccine  0.5 mL Intramuscular Tomorrow-1000   Continuous Infusions: . ampicillin-sulbactam (UNASYN) IV 3 g (08/12/19 0842)  . azithromycin    . lactated ringers    . [START ON 08/13/2019] remdesivir 100 mg in NS 100 mL     PRN Meds:.acetaminophen **OR** acetaminophen, albuterol, chlorpheniramine-HYDROcodone, guaiFENesin-dextromethorphan, ondansetron **OR** ondansetron (ZOFRAN) IV, polyethylene glycol   Time Spent in minutes 35   See all Orders from today for further details   Oren Binet M.D on 08/12/2019 at 10:29 AM  To page go to [email or web address] - use universal  password  [facility] -  Office  [phone]    Objective:   Vitals:   08/12/19 0553 08/12/19 0653 08/12/19 0700 08/12/19 0800  BP: 111/76  101/69 117/80  Pulse: (!) 115  (!) 110 (!) 111  Resp: (!) 30  (!) 31 (!) 26  Temp: 97.9 F (36.6 C) 98.6 F (37 C) 97.7 F (36.5 C)   TempSrc: Oral Oral Oral   SpO2: 96%  94% 95%  Weight:      Height:        Wt Readings from Last 3 Encounters:  08/12/19 105.6 kg  06/22/19 118.7 kg  12/02/16 124.4 kg     Intake/Output Summary (Last 24 hours) at 08/12/2019 1029 Last data filed at 08/12/2019 0900 Gross per 24 hour  Intake 440 ml  Output 60 ml  Net 380 ml     Physical Exam Gen Exam: Awake-slightly lethargic-slow to respond-but not in any distress at rest. HEENT:atraumatic, normocephalic Chest: Bibasilar rales CVS:S1S2 regular Abdomen:soft non tender, non distended Extremities:trace edema Neurology: Moving all 4 extremities.   Skin: no rash   Data Review:    CBC Recent Labs  Lab 08/11/19 1614 08/11/19 2349 08/12/19 0922  WBC 19.9*  --  14.7*  HGB 15.8 15.6 15.3  HCT 51.4 46.0 48.4  PLT 345  --  297  MCV 87.0  --  85.1  MCH 26.7  --  26.9  MCHC 30.7  --  31.6  RDW 16.4*  --  16.3*  LYMPHSABS 0.6*  --  PENDING  MONOABS 1.2*  --  PENDING  EOSABS 0.0  --  PENDING  BASOSABS 0.1  --  PENDING    Chemistries  Recent Labs  Lab 08/11/19 1614 08/11/19 2349  NA 139 134*  K 5.0 4.6  CL 100  --   CO2 18*  --   GLUCOSE 392*  --   BUN 11  --   CREATININE 1.65*  --   CALCIUM 8.9  --   AST 24  --   ALT 23  --   ALKPHOS 68  --   BILITOT 3.3*  --    ------------------------------------------------------------------------------------------------------------------ Recent Labs    08/11/19 1615  TRIG 159*    Lab Results  Component Value Date   HGBA1C 12.4 (H) 08/12/2019   ------------------------------------------------------------------------------------------------------------------ No results for  input(s): TSH, T4TOTAL, T3FREE, THYROIDAB in the last 72 hours.  Invalid input(s): FREET3 ------------------------------------------------------------------------------------------------------------------ Recent Labs    08/11/19 1614  FERRITIN 469*    Coagulation profile Recent Labs  Lab 08/11/19 1614  INR 1.7*    Recent Labs    08/11/19 1614  DDIMER 11.45*    Cardiac Enzymes No results for input(s): CKMB, TROPONINI, MYOGLOBIN in the last 168 hours.  Invalid input(s): CK ------------------------------------------------------------------------------------------------------------------    Component Value Date/Time   BNP 1,918.7 (H) 08/12/2019 0106    Micro Results No results found for this or any previous visit (from the past 240 hour(s)).  Radiology Reports CT Angio Chest PE W/Cm &/Or Wo Cm  Result Date: 08/11/2019 CLINICAL DATA:  Shortness of breath, hypoxia, hemoptysis, recent COVID-19 diagnosis 6 days prior EXAM: CT ANGIOGRAPHY CHEST WITH CONTRAST TECHNIQUE: Multidetector CT imaging of the chest was performed using the standard protocol during bolus administration of intravenous contrast. Multiplanar CT image reconstructions and MIPs were obtained to evaluate the vascular anatomy. CONTRAST:  OMNIPAQUE IOHEXOL 350 MG/ML SOLN COMPARISON:  Same-day radiograph FINDINGS: Cardiovascular: Satisfactory opacification the pulmonary arteries to the segmental level. Evaluation beyond the lobar level is limited due to extensive respiratory motion artifact. However, there is suspected filling defect within the segmental and subsegmental pulmonary arteries of the posterior right lower lobe extending to the ostium of the right upper lobar pulmonary artery. Finding is best seen on coronal MIP imaging series 14, image [id number]. Several of the pulmonary arteries in the region of the superior segment right lower lobe and right middle lobe appear narrowed as the past within the perihilar  density detailed below. Central pulmonary arteries are normal caliber. Slight reflux of contrast into the IVC and hepatic veins. Without other convincing CT evidence of right heart strain. Borderline cardiomegaly without pericardial effusion. Atherosclerotic plaque within the normal caliber aorta. Shared origin of the left common carotid and brachiocephalic artery. Minimal plaque in the proximal great vessels. Mediastinum/Nodes: Multiple subcentimeter mediastinal and hilar nodes are present. More confluent soft tissue opacity extending within the subcarinal space in into the right hilum (9/44-48). Mild focal narrowing of the right mainstem bronchus and bronchus intermedius in this region of soft tissue attenuation. Possibly reflecting conglomerate nodal tissue versus direct extension of a mass. Tiny diverticulum seen arising from the inferomedial aspect of the left mainstem bronchus. No other acute abnormality of the central airways. Difficult to discern the margins of the distal thoracic esophagus from the soft tissue in the subcarinal region. Thyroid gland and thoracic inlet are unremarkable. Lungs/Pleura: Dense masslike consolidation measuring approximately 7.7 x 8.2 cm (11/35) is seen in the posterior segment right upper  lobe abutting the major fissure with confluent soft tissue attenuation extending into the right hilum and subcarinal region which is highly worrisome for an underlying mass lesion particularly given narrowing of the airways and pulmonary arteries in this location. However, there are additional peripheral tree-in-bud opacities and multifocal mixed ground-glass and consolidation elsewhere throughout the lungs. No visible pneumothorax or effusion. Background of centrilobular emphysema is noted towards the apices. Upper Abdomen: No acute abnormalities present in the visualized portions of the upper abdomen. Musculoskeletal: No acute osseous abnormality or suspicious osseous lesion. Multilevel  degenerative changes are present in the imaged portions of the spine. Additional degenerative changes at the left sternoclavicular joint and bilateral shoulders. Review of the MIP images confirms the above findings. IMPRESSION: 1. Segmental and subsegmental pulmonary artery emboli in the pulmonary arteries of the posterior segment right upper lobe extending towards the ostial origin from the left upper lobar pulmonary artery. No CT evidence of right heart strain. 2. Dense masslike consolidation measuring approximately 7.7 x 8.2 cm in the posterior segment right upper lobe abutting the major fissure with confluent soft tissue attenuation extending into the right hilum and subcarinal region which is highly worrisome for an underlying mass lesion particularly given narrowing of the airways and pulmonary arteries in this location. While dense infectious consolidation or large pulmonary infarct could have a similar appearance high suspicion should be maintained with continued follow-up imaging following acute intervention. Surrounding areas of ground-glass and tree-in-bud nodularity could reflect some postobstructive pneumonia given truncation the airways entering this region. 3. Additional multifocal areas of mixed consolidation and ground-glass opacity compatible with atypical pneumonia in the setting of known COVID-19 positivity. 4. Additional subcentimeter mediastinal and hilar nodes, likely reactive though metastatic disease is not excluded. 5. Aortic Atherosclerosis (ICD10-I70.0). These results were called by telephone at the time of interpretation on 08/11/2019 at 10:48 pm to provider GARRETT GREEN , who verbally acknowledged these results. Electronically Signed   By: Kreg Shropshire M.D.   On: 08/11/2019 22:49   DG Chest Port 1 View  Result Date: 08/11/2019 CLINICAL DATA:  Fall shortness of breath, hypoxia, COVID-19 positive EXAM: PORTABLE CHEST 1 VIEW COMPARISON:  Radiograph 10/24/2013 FINDINGS: Focal opacity is  seen in the right mid lung, likely in the upper lobe. More mixed patchy and interstitial opacities are seen in the lung bases and periphery. No pneumothorax or visible effusion. Mild cardiomegaly though possibly accentuated by portable technique. Remaining cardiomediastinal contours are unremarkable. No acute osseous or soft tissue abnormality. Telemetry leads overlie the chest. IMPRESSION: 1. Focal opacity in the right mid lung, likely in the upper lobe, suspicious for pneumonia. 2. Additional patchy and interstitial opacities in the lung bases and periphery may reflect additional areas of infection in the setting of COVID-19. Electronically Signed   By: Kreg Shropshire M.D.   On: 08/11/2019 15:38   US Abdomen Limited RUQ  Result Date: 08/12/2019 CLINICAL DATA:  Abnormal LFTs EXAM: ULTRASOUND ABDOMEN LIMITED RIGHT UPPER QUADRANT COMPARISON:  None. FINDINGS: Gallbladder: The gallbladder appears to be filled with slightly a hyperechoic sludge. No sonographic Murphy sign. The gallbladder wall thickness measures 3 mm. No pericholecystic fluid. Common bile duct: Diameter: 5 mm Liver: No focal lesion identified. Within normal limits in parenchymal echogenicity. Portal vein is patent on color Doppler imaging with normal direction of blood flow towards the liver. Other: None. IMPRESSION: Gallbladder sludge.  No definite evidence of cholecystitis. Electronically Signed   By: Jonna Clark M.D.   On: 08/12/2019 01:58

## 2019-08-12 NOTE — H&P (Addendum)
History and Physical    Kevin Long ZSM:[id number] DOB: 07/12/1960 DOA: 08/11/2019  PCP: Fleet Contras, MD  Patient coming from: Home   Chief Complaint:  Chief Complaint  Patient presents with  . Sepsis alert/ Covid+     HPI:  59 year old male with past medical history of hypertension, diabetes mellitus type 2, nonischemic cardiomyopathy (Echo 05/2019 with EF less than 20%), benign paroxysmal positional vertigo, obesity and recent diagnosis of COVID-19 at [facility] on 4/29 who presents to [facility] emergency department with complaints of shortness of breath.  Patient is an extremely poor historian at this time due to encephalopathy.  Patient explains that he is severely short of breath and has been for at least the past several days.  Patient explains also exhibiting a cough but it is unclear as to whether or not he is producing any sputum.  Patient denies chest pain but does complain of associated watery diarrhea for the past several days and is unable to provide further details.  Patient is also complaining of bilateral lower extremity swelling although the chronicity of this is unclear.  Since his diagnosis of COVID-19 on 4/29, patient's symptoms have rapidly progressed prompting the patient to present to [facility] emergency department for evaluation.  Upon evaluation in the emergency department patient was found have multiple SIRS criteria as well as lactic acidosis concerning for sepsis.  Patient underwent CT angiogram of the chest due to markedly elevated D-dimer of 11.45 which revealed a right upper lobe pulmonary embolism, mediastinal and hilar lymphadenopathy, a masslike consolidation measuring 7.7 x 8.2 cm in the right upper lobe that is possibly malignancy or dense consolidation as well as surrounding areas of tree-in-bud opacities concerning for a postobstructive pneumonia.  Patient was given 2 L of intravenous lactated Ringer bolus.  Patient initiated  on intravenous azithromycin and ceftriaxone.  Hospitalist group was then called to assess patient for admission to the hospital.  Lactic acid of 4.1 increased to 4.4 status post 2 L of bolus lactated Ringer solution.  Review of Systems: Unable to perform due to advanced encephalopathy  Past Medical History:  Diagnosis Date  . Asthma   . Diabetes mellitus, type 2 (HCC)   . Hypercholesterolemia   . Hypertension   . Obesity   . OSA (obstructive sleep apnea)   . Other primary cardiomyopathies     Past Surgical History:  Procedure Laterality Date  . FOOT SURGERY     has pin and screws  . LEFT AND RIGHT HEART CATHETERIZATION WITH CORONARY ANGIOGRAM N/A 08/04/2012   Procedure: LEFT AND RIGHT HEART CATHETERIZATION WITH CORONARY ANGIOGRAM;  Surgeon: Corky Crafts, MD;  Location: [facility] CATH LAB;  Service: Cardiovascular;  Laterality: N/A;     reports that he quit smoking about 14 years ago. He has never used smokeless tobacco. He reports that he does not drink alcohol or use drugs.  Allergies  Allergen Reactions  . Celebrex [Celecoxib] Swelling    Swelling in face  . Entresto [Sacubitril-Valsartan] Other (See Comments)    Dizziness    Family History  Problem Relation Age of Onset  . Diabetes Mother   . Anxiety disorder Brother   . Depression Son   . Heart murmur Son   . Heart attack Neg Hx   . Hypertension Neg Hx   . Stroke Neg Hx      Prior to Admission medications   Medication Sig Start Date End Date Taking? Authorizing Provider  aspirin EC 81 MG  tablet Take 1 tablet (81 mg total) by mouth daily. 01/04/16   Laurey Morale, MD  carvedilol (COREG) 12.5 MG tablet Take 12.5 mg by mouth 2 (two) times daily with a meal.    [provider]  furosemide (LASIX) 40 MG tablet Take 40 mg by mouth.    [provider]  insulin aspart (NOVOLOG) 100 UNIT/ML injection Inject into the skin 3 (three) times daily before meals.    [provider]  Insulin Glargine  (LANTUS SOLOSTAR) 100 UNIT/ML SOPN Inject 70 Units into the skin at bedtime.     [provider]  isosorbide-hydrALAZINE (BIDIL) 20-37.5 MG tablet Take 1 tablet by mouth 3 (three) times daily. 06/22/19   Yates Decamp, MD    Physical Exam: Vitals:   08/11/19 2130 08/11/19 2230 08/11/19 2345 08/12/19 0030  BP: (!) 138/95 137/83 129/77 113/83  Pulse:   (!) 136   Resp: (!) 30 (!) 32 (!) 26 (!) 28  Temp:      TempSrc:      SpO2:   (!) 77%   Weight:      Height:        Constitutional: Patient is lethargic but arousable and oriented x2.  Patient is in respiratory distress.  Patient is obese. Skin: no rashes, no lesions, notable poor skin turgor. Eyes: Pupils are equally reactive to light.  No evidence of scleral icterus or conjunctival pallor.  ENMT: Dry mucous membranes noted.  Posterior pharynx clear of any exudate or lesions.   Neck: normal, supple, no masses, no thyromegaly.  No evidence of jugular venous distension.   Respiratory: Extremely coarse breath sounds in all fields.  Significant rales noted in the bilateral lower and mid fields.  Cl patient is tachypneic without accessory muscle use.  Cardiovascular: Tachycardic rate but regular rhythm.  No murmurs / rubs / gallops.  Trace distal bilateral lower extremity edema.  2+ pedal pulses. No carotid bruits.  Chest:   Nontender without crepitus or deformity.   Back:   Nontender without crepitus or deformity. Abdomen: Abdomen is protuberant but soft.  No evidence of intra-abdominal masses.  Positive bowel sounds noted in all quadrants.   Musculoskeletal: No joint deformity upper and lower extremities.  Peripheral edema as noted above.  Notable tenderness of the distal bilateral lower extremities.  Good ROM, no contractures. Normal muscle tone.  Neurologic: Patient is following all commands.  Patient is lethargic but arousable and disoriented.  Sensation is grossly intact.  Patient is responsive to verbal and painful  stimuli. Psychiatric: Unable to fully assess due to encephalopathy.  Currently, patient does not possess insight as to his current situation.   Labs on Admission: I have personally reviewed following labs and imaging studies -   CBC: Recent Labs  Lab 08/11/19 1614 08/11/19 2349  WBC 19.9*  --   NEUTROABS 17.8*  --   HGB 15.8 15.6  HCT 51.4 46.0  MCV 87.0  --   PLT 345  --    Basic Metabolic Panel: Recent Labs  Lab 08/11/19 1614 08/11/19 2349  NA 139 134*  K 5.0 4.6  CL 100  --   CO2 18*  --   GLUCOSE 392*  --   BUN 11  --   CREATININE 1.65*  --   CALCIUM 8.9  --    GFR: Estimated Creatinine Clearance: 56.8 mL/min (A) (by C-G formula based on SCr of 1.65 mg/dL (H)). Liver Function Tests: Recent Labs  Lab 08/11/19 1614  AST  24  ALT 23  ALKPHOS 68  BILITOT 3.3*  PROT 6.9  ALBUMIN 3.0*   No results for input(s): LIPASE, AMYLASE in the last 168 hours. No results for input(s): AMMONIA in the last 168 hours. Coagulation Profile: Recent Labs  Lab 08/11/19 1614  INR 1.7*   Cardiac Enzymes: No results for input(s): CKTOTAL, CKMB, CKMBINDEX, TROPONINI in the last 168 hours. BNP (last 3 results) No results for input(s): PROBNP in the last 8760 hours. HbA1C: No results for input(s): HGBA1C in the last 72 hours. CBG: No results for input(s): GLUCAP in the last 168 hours. Lipid Profile: Recent Labs    08/11/19 1615  TRIG 159*   Thyroid Function Tests: No results for input(s): TSH, T4TOTAL, FREET4, T3FREE, THYROIDAB in the last 72 hours. Anemia Panel: Recent Labs    08/11/19 1614  FERRITIN 469*   Urine analysis:    Component Value Date/Time   COLORURINE YELLOW 08/11/2019 2106   APPEARANCEUR CLEAR 08/11/2019 2106   LABSPEC 1.030 08/11/2019 2106   PHURINE 5.0 08/11/2019 2106   GLUCOSEU >=500 (A) 08/11/2019 2106   HGBUR MODERATE (A) 08/11/2019 2106   BILIRUBINUR NEGATIVE 08/11/2019 2106   KETONESUR 20 (A) 08/11/2019 2106   PROTEINUR NEGATIVE  08/11/2019 2106   NITRITE NEGATIVE 08/11/2019 2106   LEUKOCYTESUR NEGATIVE 08/11/2019 2106    Radiological Exams on Admission - Personally Reviewed: CT Angio Chest PE W/Cm &/Or Wo Cm  Result Date: 08/11/2019 CLINICAL DATA:  Shortness of breath, hypoxia, hemoptysis, recent COVID-19 diagnosis 6 days prior EXAM: CT ANGIOGRAPHY CHEST WITH CONTRAST TECHNIQUE: Multidetector CT imaging of the chest was performed using the standard protocol during bolus administration of intravenous contrast. Multiplanar CT image reconstructions and MIPs were obtained to evaluate the vascular anatomy. CONTRAST:  OMNIPAQUE IOHEXOL 350 MG/ML SOLN COMPARISON:  Same-day radiograph FINDINGS: Cardiovascular: Satisfactory opacification the pulmonary arteries to the segmental level. Evaluation beyond the lobar level is limited due to extensive respiratory motion artifact. However, there is suspected filling defect within the segmental and subsegmental pulmonary arteries of the posterior right lower lobe extending to the ostium of the right upper lobar pulmonary artery. Finding is best seen on coronal MIP imaging series 14, image [id number]. Several of the pulmonary arteries in the region of the superior segment right lower lobe and right middle lobe appear narrowed as the past within the perihilar density detailed below. Central pulmonary arteries are normal caliber. Slight reflux of contrast into the IVC and hepatic veins. Without other convincing CT evidence of right heart strain. Borderline cardiomegaly without pericardial effusion. Atherosclerotic plaque within the normal caliber aorta. Shared origin of the left common carotid and brachiocephalic artery. Minimal plaque in the proximal great vessels. Mediastinum/Nodes: Multiple subcentimeter mediastinal and hilar nodes are present. More confluent soft tissue opacity extending within the subcarinal space in into the right hilum (9/44-48). Mild focal narrowing of the right mainstem  bronchus and bronchus intermedius in this region of soft tissue attenuation. Possibly reflecting conglomerate nodal tissue versus direct extension of a mass. Tiny diverticulum seen arising from the inferomedial aspect of the left mainstem bronchus. No other acute abnormality of the central airways. Difficult to discern the margins of the distal thoracic esophagus from the soft tissue in the subcarinal region. Thyroid gland and thoracic inlet are unremarkable. Lungs/Pleura: Dense masslike consolidation measuring approximately 7.7 x 8.2 cm (11/35) is seen in the posterior segment right upper lobe abutting the major fissure with confluent soft tissue attenuation extending into the right hilum and subcarinal region  which is highly worrisome for an underlying mass lesion particularly given narrowing of the airways and pulmonary arteries in this location. However, there are additional peripheral tree-in-bud opacities and multifocal mixed ground-glass and consolidation elsewhere throughout the lungs. No visible pneumothorax or effusion. Background of centrilobular emphysema is noted towards the apices. Upper Abdomen: No acute abnormalities present in the visualized portions of the upper abdomen. Musculoskeletal: No acute osseous abnormality or suspicious osseous lesion. Multilevel degenerative changes are present in the imaged portions of the spine. Additional degenerative changes at the left sternoclavicular joint and bilateral shoulders. Review of the MIP images confirms the above findings. IMPRESSION: 1. Segmental and subsegmental pulmonary artery emboli in the pulmonary arteries of the posterior segment right upper lobe extending towards the ostial origin from the left upper lobar pulmonary artery. No CT evidence of right heart strain. 2. Dense masslike consolidation measuring approximately 7.7 x 8.2 cm in the posterior segment right upper lobe abutting the major fissure with confluent soft tissue attenuation extending  into the right hilum and subcarinal region which is highly worrisome for an underlying mass lesion particularly given narrowing of the airways and pulmonary arteries in this location. While dense infectious consolidation or large pulmonary infarct could have a similar appearance high suspicion should be maintained with continued follow-up imaging following acute intervention. Surrounding areas of ground-glass and tree-in-bud nodularity could reflect some postobstructive pneumonia given truncation the airways entering this region. 3. Additional multifocal areas of mixed consolidation and ground-glass opacity compatible with atypical pneumonia in the setting of known COVID-19 positivity. 4. Additional subcentimeter mediastinal and hilar nodes, likely reactive though metastatic disease is not excluded. 5. Aortic Atherosclerosis (ICD10-I70.0). These results were called by telephone at the time of interpretation on 08/11/2019 at 10:48 pm to provider GARRETT GREEN , who verbally acknowledged these results. Electronically Signed   By: Lovena Le M.D.   On: 08/11/2019 22:49   DG Chest Port 1 View  Result Date: 08/11/2019 CLINICAL DATA:  Fall shortness of breath, hypoxia, COVID-19 positive EXAM: PORTABLE CHEST 1 VIEW COMPARISON:  Radiograph 10/24/2013 FINDINGS: Focal opacity is seen in the right mid lung, likely in the upper lobe. More mixed patchy and interstitial opacities are seen in the lung bases and periphery. No pneumothorax or visible effusion. Mild cardiomegaly though possibly accentuated by portable technique. Remaining cardiomediastinal contours are unremarkable. No acute osseous or soft tissue abnormality. Telemetry leads overlie the chest. IMPRESSION: 1. Focal opacity in the right mid lung, likely in the upper lobe, suspicious for pneumonia. 2. Additional patchy and interstitial opacities in the lung bases and periphery may reflect additional areas of infection in the setting of COVID-19. Electronically  Signed   By: Lovena Le M.D.   On: 08/11/2019 15:38    EKG: Personally reviewed.  Rhythm is sinus tachycardia with heart rate of 128 bpm.  No dynamic ST segment changes appreciated.  Assessment/Plan Principal Problem:   Sepsis due to COVID-19 [facility])  Patient exhibiting multiple SIRS criteria in the setting of bilateral COVID-19 pneumonia with concurrent lactic acidosis and acute hypoxic respiratory failure  Patient is at exceedingly high risk of rapid clinical decompensation and need for intubation with intensive care unit transfer if respiratory status worsens further  Patient has already received 2 L of intravenous lactated Ringer's via bolus by the emergency department staff  Case discussed with Dr. Genevive Bi with CCM: She agrees with continuation of intravenous ceftriaxone and azithromycin for treatment of suspected bacterial pneumonia with COVID-19 despite the normal procalcitonin considering extremely  high risk of rapid clinical decline.  She also agrees with abstaining from further intravenous volume resuscitation for now until patient's condition has stabilized further.  If patient's respiratory status is stable in the morning then gentle intravenous hydration can be initiated at that time.  She additionally agrees with treatments remdesivir and dexamethasone for COVID-19.  She has notified the CCM team in case patient clinically deteriorates further, requires endotracheal intubation and transfer to the intensive care unit.  Currently providing high flow nasal cannula for acute hypoxic respiratory failure, target oxygen saturation of 94%.  Unfortunately, bedside pulse oximetry is extremely inaccurate.  ABG on high flow revealed PaO2 of 96 while bedside pulse oximetry revealed ranging values from 40% to 90%.  Continue intravenous ceftriaxone and azithromycin  Concurrently treating with intravenous remdesivir and dexamethasone  Blood cultures have been obtained  Admitting patient to  stepdown unit at this time  Active Problems:   Acute metabolic encephalopathy  Patient exhibiting substantial lethargy and disorientation  Most likely cause is COVID-19 infection, transient hypoxia or concurrent bacterial infection  Will monitor closely for clinical improvement as we treat underlying infection and hypoxia    Acute respiratory failure with hypoxemia (HCC)   Please see assessment and plan above    Lactic acidosis   Patient exhibiting severe lactic acidosis of 4.1 and 4.4  Patient is already been hydrated with 2 L of lactated Ringer solution via bolus  Due to patient's advanced cardiomyopathy with ejection fraction of less than 20%, will abstain from further aggressive volume resuscitation overnight and instead treat patient with antibiotic therapy and supplemental oxygenation  Once respiratory status is stabilized throughout the evening, will continue intravenous hydration if necessary in the morning.  Continuing to perform serial lactic acid levels to ensure downtrending and resolution    Acute pulmonary embolism without acute cor pulmonale (HCC)   Right upper lobe pulmonary embolism noted on CT angiogram of the chest  In the absence of saddle embolus or submassive pulmonary embolus I do not think that heparin infusion is necessary at this time and instead will place patient on Lovenox 1 mg/kg every 12 -GFR is currently 52.  Once patient is clinically improved and able to consistently tolerate oral intake he can likely be switched to an oral novel anticoagulant such as Eliquis or Xarelto  Etiology is likely COVID-19, although concurrent malignancy could also be the cause.    AKI (acute kidney injury) (HCC)  Intermittently hydrating with intravenous isotonic fluids while being mindful advanced cardiomyopathy  Will likely restart intravenous volume resuscitation in the morning if respiratory status remained stable  Strict input and output  monitoring  Monitoring renal function electrolytes with serial chemistries    Essential hypertension   Coreg 12.5 mg twice daily as blood pressure tolerates    Uncontrolled type 2 diabetes mellitus with hyperglycemia, with long-term current use of insulin (HCC)   Obtaining hemoglobin A1c  Placing patient on 50% reduced dose of Levemir, 35 units nightly due to tenuous oral intake  Accu-Cheks every 6 hours with sliding scale insulin  Right upper lobe lung mass   Incidental finding on CT angiogram of the chest  This is most likely malignancy although dense consolidation cannot be ruled out  Patient is clinically improved determination as to whether or not bronchoscopy or interventional radiology with biopsy would be best to identify the mass.     Code Status:  Full code Family Communication: Attempted to call patient's girlfriend and left voicemail  Status is: Inpatient  Remains inpatient appropriate because:Altered mental status, Ongoing diagnostic testing needed not appropriate for outpatient work up, IV treatments appropriate due to intensity of illness or inability to take PO and Inpatient level of care appropriate due to severity of illness   Dispo: The patient is from: Home              Anticipated d/c is to: Home              Anticipated d/c date is: > 3 days              Patient currently is not medically stable to d/c.         Marinda Elk MD [facility] Pager [phone]  If 7PM-7AM, please contact night-coverage [email or web address] Use universal [facility] password for that web site. If you do not have the password, please call the hospital operator.  08/12/2019, 12:55 AM

## 2019-08-12 NOTE — Progress Notes (Signed)
PHARMACY - PHYSICIAN COMMUNICATION CRITICAL VALUE ALERT - BLOOD CULTURE IDENTIFICATION (BCID)  Kevin Long is an 60 y.o. male who presented to [name] on [facility]/2019 with a chief complaint of hypoxia with pneumonia, ground glass opacities and pulmonary embolism.   Assessment:  BCx 1/4 (anaerobic bottle only) GPC in clusters. BCID with MSSA.  WBC is elevated but trending down. PCT is up at 6.16. SCr elevated. HIV non reactive. LA peaked at 7.9 now down to 3.8. Febrile at 102.8 on admission, now afebrile. No sputum culture available.   Name of physician (or Provider) Contacted: Dr. Jerral Ralph  Current antibiotics: Unasyn, Azithromycin  Changes to prescribed antibiotics recommended:  Recommendations accepted by provider - Change to Ancef 2g IV q8h for now with all concurrent issues and follow-up culture results. Will keep on Azithromycin for now. Monitor.   Results for orders placed or performed during the hospital encounter of 08/11/19  Blood Culture ID Panel (Reflexed) (Collected: 08/11/2019  3:58 PM)  Result Value Ref Range   Enterococcus species NOT DETECTED NOT DETECTED   Listeria monocytogenes NOT DETECTED NOT DETECTED   Staphylococcus species DETECTED (A) NOT DETECTED   Staphylococcus aureus (BCID) DETECTED (A) NOT DETECTED   Methicillin resistance NOT DETECTED NOT DETECTED   Streptococcus species NOT DETECTED NOT DETECTED   Streptococcus agalactiae NOT DETECTED NOT DETECTED   Streptococcus pneumoniae NOT DETECTED NOT DETECTED   Streptococcus pyogenes NOT DETECTED NOT DETECTED   Acinetobacter baumannii NOT DETECTED NOT DETECTED   Enterobacteriaceae species NOT DETECTED NOT DETECTED   Enterobacter cloacae complex NOT DETECTED NOT DETECTED   Escherichia coli NOT DETECTED NOT DETECTED   Klebsiella oxytoca NOT DETECTED NOT DETECTED   Klebsiella pneumoniae NOT DETECTED NOT DETECTED   Proteus species NOT DETECTED NOT DETECTED   Serratia marcescens NOT DETECTED NOT DETECTED   Haemophilus influenzae NOT DETECTED NOT DETECTED   Neisseria meningitidis NOT DETECTED NOT DETECTED   Pseudomonas aeruginosa NOT DETECTED NOT DETECTED   Candida albicans NOT DETECTED NOT DETECTED   Candida glabrata NOT DETECTED NOT DETECTED   Candida krusei NOT DETECTED NOT DETECTED   Candida parapsilosis NOT DETECTED NOT DETECTED   Candida tropicalis NOT DETECTED NOT DETECTED    [name] 08/12/2019  6:30 PM

## 2019-08-12 NOTE — ED Notes (Signed)
Report given to Jonathan RN.

## 2019-08-12 NOTE — Progress Notes (Signed)
Pharmacy Antibiotic Note  Kevin Long is a 59 y.o. male admitted on 08/11/2019 with pneumonia.  Pharmacy has been consulted for Unasyn dosing.  Plan: Unasyn 3g IV Q6H.  Height: 5\' 6"  (167.6 cm) Weight: 105.6 kg (232 lb 12.9 oz) IBW/kg (Calculated) : 63.8  Temp (24hrs), Avg:100 F (37.8 C), Min:97.9 F (36.6 C), Max:102.8 F (39.3 C)  Recent Labs  Lab 08/11/19 1614 08/11/19 1855 08/12/19 0106  WBC 19.9*  --   --   CREATININE 1.65*  --   --   LATICACIDVEN 4.1* 4.4* 7.9*    Estimated Creatinine Clearance: 54.2 mL/min (A) (by C-G formula based on SCr of 1.65 mg/dL (H)).    Allergies  Allergen Reactions  . Celebrex [Celecoxib] Swelling    Swelling in face  . Entresto [Sacubitril-Valsartan] Other (See Comments)    Dizziness     Thank you for allowing pharmacy to be a part of this patient's care.  08/14/19, PharmD, BCPS  08/12/2019 7:28 AM

## 2019-08-12 NOTE — Progress Notes (Signed)
Finally able to get in touch with patient's significant other-[phone]-and also patient's son  who was on speaker phone at the same time.  Explained seriousness of underlying medical issues- given patient's severe cardiomyopathy-good chance that he will continue to worsen and may not survive this hospitalization.  Had spoken with Dr. Jacinto Halim earlier who had recommended that we strongly consider DNR.  I conveyed to the family our recommendations that we continue to treat him-but consider DNR given the seriousness of his numerous underlying issues.  Family was given every opportunity to ask questions.  After extensive discussion-family agreed to continue to treat his numerous medical issues but did agree with a DNR order.

## 2019-08-12 NOTE — Progress Notes (Signed)
I was requested to give an opinion regarding his prognosis by Dr. Jeoffrey Massed.  I have reviewed his chart, his acute issues include the following but not limited to these 1.  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 2.  Nonischemic dilated cardiomyopathy 3.  Biventricular heart failure, systolic, worsening RV systolic function 4.  Acute pulmonary embolism 5.  Bacteremia 6.  COVID-19 pneumonia  Recommendation: I have reviewed the chart, patient is critically ill and survival chances are extremely low.  Would agree with making the patient palliative care and supportive care and certainly to make him DNR.  With supportive care if he does improve, will escalate therapy.  Otherwise I reviewed the present management and agree with the assessment and plan.  Please do not hesitate to contact me if you have any further questions.  Yates Decamp, MD, [facility] 08/12/2019, 7:28 PM [facility] [place] Office: [phone]

## 2019-08-12 NOTE — Progress Notes (Signed)
Switched patient from HFNC at 5L to regular [place] at 2L,  Current O2 sat is 93%.

## 2019-08-12 NOTE — Progress Notes (Signed)
  Echocardiogram 2D Echocardiogram has been performed.  Celene Skeen 08/12/2019, 11:20 AM

## 2019-08-13 ENCOUNTER — Inpatient Hospital Stay: Payer: Self-pay

## 2019-08-13 LAB — CBC WITH DIFFERENTIAL/PLATELET
Abs Immature Granulocytes: 0.08 10*3/uL — ABNORMAL HIGH (ref 0.00–0.07)
Basophils Absolute: 0 10*3/uL (ref 0.0–0.1)
Basophils Relative: 0 %
Eosinophils Absolute: 0 10*3/uL (ref 0.0–0.5)
Eosinophils Relative: 0 %
HCT: 48.7 % (ref 39.0–52.0)
Hemoglobin: 14.8 g/dL (ref 13.0–17.0)
Immature Granulocytes: 1 %
Lymphocytes Relative: 6 %
Lymphs Abs: 0.8 10*3/uL (ref 0.7–4.0)
MCH: 26.7 pg (ref 26.0–34.0)
MCHC: 30.4 g/dL (ref 30.0–36.0)
MCV: 87.9 fL (ref 80.0–100.0)
Monocytes Absolute: 0.7 10*3/uL (ref 0.1–1.0)
Monocytes Relative: 5 %
Neutro Abs: 11.2 10*3/uL — ABNORMAL HIGH (ref 1.7–7.7)
Neutrophils Relative %: 88 %
Platelets: 375 10*3/uL (ref 150–400)
RBC: 5.54 MIL/uL (ref 4.22–5.81)
RDW: 16.3 % — ABNORMAL HIGH (ref 11.5–15.5)
WBC: 12.8 10*3/uL — ABNORMAL HIGH (ref 4.0–10.5)
nRBC: 0 % (ref 0.0–0.2)

## 2019-08-13 LAB — C-REACTIVE PROTEIN: CRP: 28.7 mg/dL — ABNORMAL HIGH (ref <1.0)

## 2019-08-13 LAB — MAGNESIUM: Magnesium: 2.2 mg/dL (ref 1.7–2.4)

## 2019-08-13 LAB — PHOSPHORUS: Phosphorus: 4.2 mg/dL (ref 2.5–4.6)

## 2019-08-13 LAB — GLUCOSE, CAPILLARY
Glucose-Capillary: 48 mg/dL — ABNORMAL LOW (ref 70–99)
Glucose-Capillary: 68 mg/dL — ABNORMAL LOW (ref 70–99)
Glucose-Capillary: 97 mg/dL (ref 70–99)
Glucose-Capillary: 251 mg/dL — ABNORMAL HIGH (ref 70–99)
Glucose-Capillary: 367 mg/dL — ABNORMAL HIGH (ref 70–99)
Glucose-Capillary: 99 mg/dL (ref 70–99)

## 2019-08-13 LAB — COMPREHENSIVE METABOLIC PANEL
ALT: 27 U/L (ref 0–44)
AST: 81 U/L — ABNORMAL HIGH (ref 15–41)
Albumin: 2.3 g/dL — ABNORMAL LOW (ref 3.5–5.0)
Alkaline Phosphatase: 75 U/L (ref 38–126)
Anion gap: 18 — ABNORMAL HIGH (ref 5–15)
BUN: 30 mg/dL — ABNORMAL HIGH (ref 6–20)
CO2: 17 mmol/L — ABNORMAL LOW (ref 22–32)
Calcium: 8.6 mg/dL — ABNORMAL LOW (ref 8.9–10.3)
Chloride: 107 mmol/L (ref 98–111)
Creatinine, Ser: 1.83 mg/dL — ABNORMAL HIGH (ref 0.61–1.24)
GFR calc Af Amer: 45 mL/min — ABNORMAL LOW (ref >60)
GFR calc non Af Amer: 39 mL/min — ABNORMAL LOW (ref >60)
Glucose, Bld: 221 mg/dL — ABNORMAL HIGH (ref 70–99)
Potassium: 5.1 mmol/L (ref 3.5–5.1)
Sodium: 142 mmol/L (ref 135–145)
Total Bilirubin: 1.4 mg/dL — ABNORMAL HIGH (ref 0.3–1.2)
Total Protein: 5.6 g/dL — ABNORMAL LOW (ref 6.5–8.1)

## 2019-08-13 LAB — PROCALCITONIN: Procalcitonin: 5.22 ng/mL

## 2019-08-13 LAB — HEPARIN LEVEL (UNFRACTIONATED): Heparin Unfractionated: 0.78 IU/mL — ABNORMAL HIGH (ref 0.30–0.70)

## 2019-08-13 LAB — D-DIMER, QUANTITATIVE: D-Dimer, Quant: 3.62 ug/mL-FEU — ABNORMAL HIGH (ref 0.00–0.50)

## 2019-08-13 LAB — BRAIN NATRIURETIC PEPTIDE: B Natriuretic Peptide: 701 pg/mL — ABNORMAL HIGH (ref 0.0–100.0)

## 2019-08-13 LAB — FERRITIN: Ferritin: 1224 ng/mL — ABNORMAL HIGH (ref 24–336)

## 2019-08-13 MED ORDER — INSULIN GLARGINE 100 UNIT/ML ~~LOC~~ SOLN
25.0000 [IU] | Freq: Every day | SUBCUTANEOUS | Status: DC
  Administered 2019-08-14: 25 [IU] via SUBCUTANEOUS
  Filled 2019-08-13 (×3): qty 0.25

## 2019-08-13 NOTE — Evaluation (Signed)
Physical Therapy Evaluation Patient Details Name: Kevin Long MRN: [id number] DOB: 1960-11-23 Today's Date: 08/13/2019   History of Present Illness   59 y.o. male with PMHx of chronic systolic heart failure, HTN, DM-2-, OSA noncompliant to CPAP who presented with shortness of breath-found to have acute hypoxic respiratory failure secondary to PE, COVID-19 pneumonia, lung mass, decompensated heart failure (EF <20%), and possible bacterial pneumonia.  Post admission-blood cultures positive for MSSA. Encephalopathy  Clinical Impression   Pt admitted with above diagnoses. Patient remains ?mildly confused and therefore information he provides about home setup and his prior functional status may not be accurate. He states he had not walked in weeks to months due to bil foot pain. Today he was primarily unable to stand due to bil foot pain. Noted lt foot with edema compared to right. Accurate assessment of LE strength limited due to pain. He was cooperative and on 2L his sats stayed 90% on 2L the majority of the session; HR 109-118 bpm.   Discharge plan at this time is difficult to determine. Per patient, has family support, but ?can provide 24/7 assist at the level he currently requires. If cognition improves, family support is available, and foot pain decreases, he could be a good candidate for CIR. Pt currently with functional limitations due to the deficits listed below (see PT Problem List). Pt will benefit from skilled PT to increase their independence and safety with mobility to allow discharge to the venue listed below.       Follow Up Recommendations Other (comment)(?CIR if cognition improves and has family support)    Equipment Recommendations  Rolling walker with 5" wheels;Wheelchair (measurements PT);Wheelchair cushion (measurements PT);3in1 (PT)(if discharges straight home)    Recommendations for Other Services Other (comment)(Rehab consult may become appropriate)     Precautions /  Restrictions Precautions Precautions: Fall;Other (comment) Precaution Comments: confusion      Mobility  Bed Mobility Overal bed mobility: Needs Assistance Bed Mobility: Rolling;Sidelying to Sit;Sit to Sidelying Rolling: Supervision Sidelying to sit: Mod assist     Sit to sidelying: Min assist General bed mobility comments: instructional cues required, +rail, assist for legs and raising torso  Transfers Overall transfer level: Needs assistance Equipment used: None Transfers: Lateral/Scoot Transfers          Lateral/Scoot Transfers: Min guard General transfer comment: unable to tolerate full pressure on bil feet to attempt standing; able to use UEs and partial weight through bil feet to lateral scoot x 3 towards HOB prior to return to bed  Ambulation/Gait             General Gait Details: currently unable  Stairs            Wheelchair Mobility    Modified Rankin (Stroke Patients Only)       Balance Overall balance assessment: Needs assistance Sitting-balance support: No upper extremity supported;Feet supported Sitting balance-Leahy Scale: Good         Standing balance comment: uanble                             Pertinent Vitals/Pain Pain Assessment: Faces Faces Pain Scale: Hurts whole lot Pain Location: bil feet, left worst Pain Descriptors / Indicators: Constant;Grimacing;Tender Pain Intervention(s): Limited activity within patient's tolerance;Monitored during session;Other (comment)(RN made aware)    Home Living Family/patient expects to be discharged to:: Private residence Living Arrangements: Children;Spouse/significant other(son and daughter in law) Available Help at Discharge: Family(all family now has  COVID 19) Type of Home: House Home Access: Stairs to enter Entrance Stairs-Rails: None Entrance Stairs-Number of Steps: 2 Home Layout: Two level;Able to live on main level with bedroom/bathroom(back area of house 2 steps  down; he doesn't go there) Home Equipment: Crutches;Walker - 2 wheels Additional Comments: per patient with ?accuracy    Prior Function Level of Independence: Needs assistance   Gait / Transfers Assistance Needed: reports has not walked in months due to bil foot pain; when asked how he got to [facility] for COVID test, he states his son helped him walk to truck, he stayed in truck for the test, and son helped him back in the house  ADL's / Homemaking Assistance Needed: while bedbound, reports he has been using a "jug" for urine and describes having BM on underpad in the bed and son cleans him         Hand Dominance        Extremity/Trunk Assessment   Upper Extremity Assessment Upper Extremity Assessment: Defer to OT evaluation(AROM grossly WFL)    Lower Extremity Assessment Lower Extremity Assessment: RLE deficits/detail;LLE deficits/detail RLE Deficits / Details: able to perform AROM WFL except ankle DF to neutral, limited toe movement RLE: Unable to fully assess due to pain LLE Deficits / Details: able to perform AROM WFL except ankle DF to neutral, limited toe movement; min swelling dorsum of foot and 2nd toe with dark/black discoloration (RN made aware) LLE: Unable to fully assess due to pain    Cervical / Trunk Assessment Cervical / Trunk Assessment: Other exceptions Cervical / Trunk Exceptions: overweight  Communication   Communication: No difficulties  Cognition Arousal/Alertness: Awake/alert Behavior During Therapy: Flat affect Overall Cognitive Status: No family/caregiver present to determine baseline cognitive functioning                                 General Comments: a&ox4 (day and date); some confused statements creating ?accuracy of his reports of prior functional status and home setup      General Comments General comments (skin integrity, edema, etc.): Noted nothing in chart re: bil foot pain or neuropathy. RN was also unaware he's been having  foot pain.     Exercises General Exercises - Lower Extremity Ankle Circles/Pumps: AROM;Both;10 reps;Supine Heel Slides: AROM;Both;5 reps;Supine   Assessment/Plan    PT Assessment Patient needs continued PT services  PT Problem List Decreased strength;Decreased range of motion;Decreased activity tolerance;Decreased balance;Decreased mobility;Decreased cognition;Decreased knowledge of use of DME;Decreased safety awareness;Cardiopulmonary status limiting activity;Obesity;Pain       PT Treatment Interventions DME instruction;Gait training;Stair training;Functional mobility training;Therapeutic activities;Therapeutic exercise;Balance training;Cognitive remediation;Patient/family education    PT Goals (Current goals can be found in the Care Plan section)  Acute Rehab PT Goals Patient Stated Goal: stop pain in feet PT Goal Formulation: Patient unable to participate in goal setting Time For Goal Achievement: 08/27/19 Potential to Achieve Goals: Good    Frequency Min 3X/week   Barriers to discharge Inaccessible home environment pt reports 2 steps to enter home    Co-evaluation               AM-PAC PT "6 Clicks" Mobility  Outcome Measure Help needed turning from your back to your side while in a flat bed without using bedrails?: A Little Help needed moving from lying on your back to sitting on the side of a flat bed without using bedrails?: A Lot Help needed moving to and  from a bed to a chair (including a wheelchair)?: Total Help needed standing up from a chair using your arms (e.g., wheelchair or bedside chair)?: Total Help needed to walk in hospital room?: Total Help needed climbing 3-5 steps with a railing? : Total 6 Click Score: 9    End of Session Equipment Utilized During Treatment: Oxygen Activity Tolerance: Patient limited by pain Patient left: in bed;with nursing/sitter in room(RN in to start an IV--made aware to turn on bed alarm) Nurse Communication: Mobility  status;Other (comment)(painful feet) PT Visit Diagnosis: Muscle weakness (generalized) (M62.81);Difficulty in walking, not elsewhere classified (R26.2);Pain Pain - Right/Left: (bil) Pain - part of body: Ankle and joints of foot    Time: 2111-5520 PT Time Calculation (min) (ACUTE ONLY): 33 min   Charges:   PT Evaluation $PT Eval Moderate Complexity: 1 Mod PT Treatments $Therapeutic Activity: 8-22 mins         Arby Barrette, PT Pager [phone]   Rexanne Mano 08/13/2019, 6:36 PM

## 2019-08-13 NOTE — Progress Notes (Signed)
Hypoglycemic Event  CBG: 48  Treatment: 8 oz juice/soda  Symptoms: None  Follow-up CBG: Time:0821 CBG Result:68  Treatment: breakfast tray   Follow up CBG: Time : 0848 CBG: 97  Possible Reasons for Event: unknown     Comments/MD notified: Ghimire,MD    Kevin Long Kevin Long

## 2019-08-13 NOTE — Progress Notes (Signed)
PROGRESS NOTE                                                                                                                                                                                                             Patient Demographics:    Kevin Long, is a 59 y.o. male, DOB - 05/13/1960, ZOX:[id number]  Outpatient Primary MD for the patient is Fleet Contras, MD   Admit date - 08/11/2019   LOS - 1  Chief Complaint  Patient presents with  . Sepsis alert, Covid pos       Brief Narrative: Patient is a 59 y.o. male with PMHx of chronic systolic heart failure, HTN, DM-2-, OSA noncompliant to CPAP who presented with shortness of breath-found to have acute hypoxic respiratory failure secondary to PE, COVID-19 pneumonia, lung mass, decompensated heart failure and possible bacterial pneumonia.  Post admission-blood cultures positive for MSSA.  See below for further details.  Significant Events: 4/29>> COVID-19 positive at [facility] (results available in care everywhere) 5/13>> admit to [facility] for hypoxia-CT chest with lung mass, postobstructive pneumonia, groundglass opacities and PE.  Significant studies 5/13>> CT chest: 7.7x8.2 masslike consolidation in the right upper lobe with surrounding groundglass/tree-in-bud nodularity reflecting postobstructive pneumonia, additional areas of multifocal/groundglass pneumonia, segmental/subsegmental PE in the right upper lobe.  5/14>> TTE: EF<20%, RV systolic function severely reduced  COVID-19 medications: Steroids: 5/13>> Remdesivir: 5/13>>  Antibiotics: Ancef: 5/14>> Unasyn: 5/14>>5/14 Zithromax: 5/13>>5/15 Ceftriaxone: 5/13 x 1  Microbiology data: 5/13: Blood culture>> 1/2 MSSA (prelim) 5/13: Urine culture>> pending  DVT prophylaxis: Therapeutic Lovenox  Procedures: None  Consults: None   Subjective:   He appears much better today-significantly less confused-down to  just 2 L of oxygen.   Assessment  & Plan :   Acute Hypoxic Resp Failure due to Covid 19 Viral pneumonia, postobstructive pneumonia and pulmonary embolism: Significant improvement in hypoxemia-down to just 2-3 L of oxygen this morning.  He looks remarkably better as well.  Continue steroids/remdesivir and antibiotics.  He is not a candidate for Actemra given concurrent bacterial infection.  Fever: afebrile Incentive Spirometry: encouraged incentive spirometry use 3-4/hour. O2 requirements:  SpO2: 90 % O2 Flow Rate (L/min): 2 L/min   COVID-19 Labs: Recent Labs    08/11/19 1614 08/12/19 0826 08/12/19 0922 08/13/19 1032  DDIMER 11.45* 6.86*  --  3.62*  FERRITIN 469*  --  557* 1,224*  LDH 542*  --   --   --   CRP 15.6*  --  29.9* 28.7*       Component Value Date/Time   BNP 701.0 (H) 08/13/2019 1032    Recent Labs  Lab 08/11/19 1614 08/12/19 0922 08/13/19 1032  PROCALCITON 0.73 6.16 5.22    No results found for: SARSCOV2NAA   MSSA bacteremia: Remains on Ancef-plans are to repeat blood culture tomorrow-TTE on 5/14-no obvious vegetation.  Follow clinical trajectory-await repeat blood cultures-may need a TEE at some point.  Acute on chronic systolic heart failure (EF 25-30% by TTE on 11/19/2016): Volume status much better-hypoxia improving with only 2-2 L of oxygen requirement today (was on 8 L yesterday).  Given slight bump in creatinine-we will hold diuretics today-reassess tomorrow.  Appreciate input from patient's primary cardiologist Dr. Jacinto Halim.  Acute metabolic encephalopathy: Secondary to hypoxemia/bacteremia.  Improving with treatment of the underlying etiologies.  He is much more awake and alert compared to yesterday.  AKI: Likely hemodynamically mediated in the setting of COVID-19-UA negative for proteinuria-awaiting renal ultrasound.  Continue supportive care-avoid nephrotoxic agents.  Lung mass: Currently situation is very tenuous-with respiratory failure-we will  treat underlying pneumonia/PE/CHF-if he stabilizes-at some point he will probably need either repeat imaging or bronchoscopy with biopsy.  He is a ex-smoker-quit 2009.  Elevated troponin: Probably demand ischemia in the setting of PE/hypoxemia-recheck troponin levels later this afternoon.  EKG nonacute.  Outpatient cardiology note-last LHC in 2014 revealed normal coronary arteries.  HTN: BP block on hold-to allow more room for diuresis.  Hopeful to resume in the next few days.  Insulin-dependent DM-2 with uncontrolled hyperglycemia due to poor outpatient control worsened by steroids (A1c 12.4 on 08/12/2019): Hypoglycemic episode earlier this morning-decrease Lantus to 25 units-continue SSI.  Stop Premeal NovoLog for now.  Will reassess tomorrow and optimize further.    Recent Labs    08/13/19 0821 08/13/19 0848 08/13/19 1145  GLUCAP 68* 97 251*   OSA: Claims that his machine broke several months ago-we will try to place him on CPAP nightly if he tolerates here in the hospital.  Morbid Obesity: Estimated body mass index is 38.07 kg/m as calculated from the following:   Height as of this encounter: 5\' 6"  (1.676 m).   Weight as of this encounter: 107 kg.    ABG:    Component Value Date/Time   PHART 7.484 (H) 08/12/2019 1023   PCO2ART 25.6 (L) 08/12/2019 1023   PO2ART 83.8 08/12/2019 1023   HCO3 19.1 (L) 08/12/2019 1023   TCO2 16 (L) 08/11/2019 2349   ACIDBASEDEF 3.9 (H) 08/12/2019 1023   O2SAT 97.6 08/12/2019 1023    Vent Settings: N/A   Condition - Extremely Guarded-very tenuous with risk for further deterioration  Family Communication  : Son Karen Chafe was on the floor-subsequently significant other updated over the phone [phone])  Code Status :  DNR-reconfirmed with son and spouse over the phone.  Explained that DO NOT RESUSCITATE does not mean do not treat-he is still getting full scope of treatment.  Diet :  Diet Order            Diet heart healthy/carb modified  Room service appropriate? Yes; Fluid consistency: Thin; Fluid restriction: 1500 mL Fluid  Diet effective now               Disposition Plan  :   Status is: Inpatient  Remains inpatient appropriate because:Inpatient level of care appropriate due to severity of illness  Dispo: The patient is from: Home              Anticipated d/c is to: Home              Anticipated d/c date is: > 3 days              Patient currently is not medically stable to d/c.   Barriers to discharge: Severe hypoxia requiring O2 supplementation-on IV antimicrobial therapy-AKI-MSSA bacteremia  Antimicorbials  :    Anti-infectives (From admission, onward)   Start     Dose/Rate Route Frequency Ordered Stop   08/13/19 1000  remdesivir 100 mg in sodium chloride 0.9 % 100 mL IVPB     100 mg 200 mL/hr over 30 Minutes Intravenous Daily 08/11/19 2347 08/17/19 0959   08/13/19 0600  ceFAZolin (ANCEF) IVPB 2g/100 mL premix     2 g 200 mL/hr over 30 Minutes Intravenous Every 8 hours 08/12/19 2248     08/12/19 2200  ceFAZolin (ANCEF) IVPB 2g/100 mL premix     2 g 200 mL/hr over 30 Minutes Intravenous Every 8 hours 08/12/19 1840 08/12/19 2218   08/12/19 1800  azithromycin (ZITHROMAX) 500 mg in sodium chloride 0.9 % 250 mL IVPB  Status:  Discontinued     500 mg 250 mL/hr over 60 Minutes Intravenous Every 24 hours 08/12/19 0709 08/13/19 1223   08/12/19 0800  Ampicillin-Sulbactam (UNASYN) 3 g in sodium chloride 0.9 % 100 mL IVPB  Status:  Discontinued     3 g 200 mL/hr over 30 Minutes Intravenous Every 6 hours 08/12/19 0730 08/12/19 1840   08/12/19 0030  remdesivir 200 mg in sodium chloride 0.9% 250 mL IVPB     200 mg 580 mL/hr over 30 Minutes Intravenous Once 08/11/19 2347 08/12/19 0219   08/11/19 1700  cefTRIAXone (ROCEPHIN) 1 g in sodium chloride 0.9 % 100 mL IVPB     1 g 200 mL/hr over 30 Minutes Intravenous  Once 08/11/19 1649 08/11/19 1756   08/11/19 1700  azithromycin (ZITHROMAX) 500 mg in sodium chloride 0.9 %  250 mL IVPB     500 mg 250 mL/hr over 60 Minutes Intravenous  Once 08/11/19 1649 08/11/19 2054      Inpatient Medications  Scheduled Meds: . dexamethasone (DECADRON) injection  10 mg Intravenous Q24H  . enoxaparin (LOVENOX) injection  105 mg Subcutaneous Q12H  . insulin aspart  0-20 Units Subcutaneous TID WC  . insulin aspart  0-5 Units Subcutaneous QHS  . insulin glargine  25 Units Subcutaneous QHS  . pneumococcal 23 valent vaccine  0.5 mL Intramuscular Tomorrow-1000   Continuous Infusions: .  ceFAZolin (ANCEF) IV 2 g (08/13/19 0548)  . remdesivir 100 mg in NS 100 mL 100 mg (08/13/19 1021)   PRN Meds:.acetaminophen **OR** acetaminophen, albuterol, chlorpheniramine-HYDROcodone, guaiFENesin-dextromethorphan, ondansetron **OR** ondansetron (ZOFRAN) IV, polyethylene glycol   Time Spent in minutes 35   See all Orders from today for further details   Jeoffrey Massed M.D on 08/13/2019 at 1:46 PM  To page go to [email or web address] - use universal password  [facility] -  Office  [phone]    Objective:   Vitals:   08/13/19 0550 08/13/19 0751 08/13/19 1004 08/13/19 1200  BP: 113/85 133/90 (!) 123/91 114/85  Pulse: 83 90 90 92  Resp: (!) 32 (!) 33 (!) 30 (!) 25  Temp: 98.2 F (36.8 C) 98.1 F (36.7 C) 97.6 F (36.4 C) 98.8 F (37.1 C)  TempSrc: Axillary  Oral   SpO2: 93%  91% 92% 90%  Weight:      Height:        Wt Readings from Last 3 Encounters:  08/13/19 107 kg  06/22/19 118.7 kg  12/02/16 124.4 kg     Intake/Output Summary (Last 24 hours) at 08/13/2019 1346 Last data filed at 08/13/2019 1238 Gross per 24 hour  Intake 2243.5 ml  Output 1150 ml  Net 1093.5 ml     Physical Exam Gen Exam:Alert awake-not in any distress HEENT:atraumatic, normocephalic Chest: B/L clear to auscultation anteriorly CVS:S1S2 regular Abdomen:soft non tender, non distended Extremities:no edema Neurology: Non focal Skin: no rash   Data Review:    CBC Recent Labs  Lab  08/11/19 1614 08/11/19 2349 08/12/19 0922 08/13/19 1032  WBC 19.9*  --  14.7* 12.8*  HGB 15.8 15.6 15.3 14.8  HCT 51.4 46.0 48.4 48.7  PLT 345  --  297 375  MCV 87.0  --  85.1 87.9  MCH 26.7  --  26.9 26.7  MCHC 30.7  --  31.6 30.4  RDW 16.4*  --  16.3* 16.3*  LYMPHSABS 0.6*  --  1.2 0.8  MONOABS 1.2*  --  0.6 0.7  EOSABS 0.0  --  0.0 0.0  BASOSABS 0.1  --  0.0 0.0    Chemistries  Recent Labs  Lab 08/11/19 1614 08/11/19 2349 08/12/19 0922 08/13/19 1032  NA 139 134* 140 142  K 5.0 4.6 4.5 5.1  CL 100  --  107 107  CO2 18*  --  19* 17*  GLUCOSE 392*  --  157* 221*  BUN 11  --  14 30*  CREATININE 1.65*  --  1.53* 1.83*  CALCIUM 8.9  --  8.8* 8.6*  MG  --   --  1.9 2.2  AST 24  --  24 81*  ALT 23  --  18 27  ALKPHOS 68  --  63 75  BILITOT 3.3*  --  1.6* 1.4*   ------------------------------------------------------------------------------------------------------------------ Recent Labs    08/11/19 1615  TRIG 159*    Lab Results  Component Value Date   HGBA1C 12.4 (H) 08/12/2019   ------------------------------------------------------------------------------------------------------------------ No results for input(s): TSH, T4TOTAL, T3FREE, THYROIDAB in the last 72 hours.  Invalid input(s): FREET3 ------------------------------------------------------------------------------------------------------------------ Recent Labs    08/12/19 0922 08/13/19 1032  FERRITIN 557* 1,224*    Coagulation profile Recent Labs  Lab 08/11/19 1614 08/12/19 0826  INR 1.7* 2.2*    Recent Labs    08/12/19 0826 08/13/19 1032  DDIMER 6.86* 3.62*    Cardiac Enzymes No results for input(s): CKMB, TROPONINI, MYOGLOBIN in the last 168 hours.  Invalid input(s): CK ------------------------------------------------------------------------------------------------------------------    Component Value Date/Time   BNP 701.0 (H) 08/13/2019 1032    Micro Results Recent  Results (from the past 240 hour(s))  Blood Culture (routine x 2)     Status: Abnormal (Preliminary result)   Collection Time: 08/11/19  3:58 PM   Specimen: BLOOD RIGHT ARM  Result Value Ref Range Status   Specimen Description BLOOD RIGHT ARM  Final   Special Requests   Final    BOTTLES DRAWN AEROBIC AND ANAEROBIC Blood Culture adequate volume   Culture  Setup Time   Final    GRAM POSITIVE COCCI IN CLUSTERS ANAEROBIC BOTTLE ONLY CRITICAL RESULT CALLED TO, READ BACK BY AND VERIFIED WITHIhor Austin PHARMD 1826 08/12/19 A BROWNING    Culture (A)  Final    STAPHYLOCOCCUS AUREUS SUSCEPTIBILITIES TO FOLLOW Performed at [facility],  [address], [place], [place] [address]    Report Status PENDING  Incomplete  Blood Culture ID Panel (Reflexed)     Status: Abnormal   Collection Time: 08/11/19  3:58 PM  Result Value Ref Range Status   Enterococcus species NOT DETECTED NOT DETECTED Final   Listeria monocytogenes NOT DETECTED NOT DETECTED Final   Staphylococcus species DETECTED (A) NOT DETECTED Final    Comment: CRITICAL RESULT CALLED TO, READ BACK BY AND VERIFIED WITH: Ihor Austin PHARMD 1826 08/12/19 A BROWNING    Staphylococcus aureus (BCID) DETECTED (A) NOT DETECTED Final    Comment: Methicillin (oxacillin) susceptible Staphylococcus aureus (MSSA). Preferred therapy is anti staphylococcal beta lactam antibiotic (Cefazolin or Nafcillin), unless clinically contraindicated. CRITICAL RESULT CALLED TO, READ BACK BY AND VERIFIED WITH: Ihor Austin PHARMD 1826 08/12/19 A BROWNING    Methicillin resistance NOT DETECTED NOT DETECTED Final   Streptococcus species NOT DETECTED NOT DETECTED Final   Streptococcus agalactiae NOT DETECTED NOT DETECTED Final   Streptococcus pneumoniae NOT DETECTED NOT DETECTED Final   Streptococcus pyogenes NOT DETECTED NOT DETECTED Final   Acinetobacter baumannii NOT DETECTED NOT DETECTED Final   Enterobacteriaceae species NOT DETECTED NOT DETECTED Final    Enterobacter cloacae complex NOT DETECTED NOT DETECTED Final   Escherichia coli NOT DETECTED NOT DETECTED Final   Klebsiella oxytoca NOT DETECTED NOT DETECTED Final   Klebsiella pneumoniae NOT DETECTED NOT DETECTED Final   Proteus species NOT DETECTED NOT DETECTED Final   Serratia marcescens NOT DETECTED NOT DETECTED Final   Haemophilus influenzae NOT DETECTED NOT DETECTED Final   Neisseria meningitidis NOT DETECTED NOT DETECTED Final   Pseudomonas aeruginosa NOT DETECTED NOT DETECTED Final   Candida albicans NOT DETECTED NOT DETECTED Final   Candida glabrata NOT DETECTED NOT DETECTED Final   Candida krusei NOT DETECTED NOT DETECTED Final   Candida parapsilosis NOT DETECTED NOT DETECTED Final   Candida tropicalis NOT DETECTED NOT DETECTED Final    Comment: Performed at [facility], [address], [place], [place] [address]    Radiology Reports CT Angio Chest PE W/Cm &/Or Wo Cm  Result Date: 08/11/2019 CLINICAL DATA:  Shortness of breath, hypoxia, hemoptysis, recent COVID-19 diagnosis 6 days prior EXAM: CT ANGIOGRAPHY CHEST WITH CONTRAST TECHNIQUE: Multidetector CT imaging of the chest was performed using the standard protocol during bolus administration of intravenous contrast. Multiplanar CT image reconstructions and MIPs were obtained to evaluate the vascular anatomy. CONTRAST:  OMNIPAQUE IOHEXOL 350 MG/ML SOLN COMPARISON:  Same-day radiograph FINDINGS: Cardiovascular: Satisfactory opacification the pulmonary arteries to the segmental level. Evaluation beyond the lobar level is limited due to extensive respiratory motion artifact. However, there is suspected filling defect within the segmental and subsegmental pulmonary arteries of the posterior right lower lobe extending to the ostium of the right upper lobar pulmonary artery. Finding is best seen on coronal MIP imaging series 14, image [id number]. Several of the pulmonary arteries in the region of the superior segment right lower  lobe and right middle lobe appear narrowed as the past within the perihilar density detailed below. Central pulmonary arteries are normal caliber. Slight reflux of contrast into the IVC and hepatic veins. Without other convincing CT evidence of right heart strain. Borderline cardiomegaly without pericardial effusion. Atherosclerotic plaque within the normal caliber aorta. Shared origin of the left common carotid and brachiocephalic artery. Minimal plaque in the proximal great vessels. Mediastinum/Nodes: Multiple subcentimeter mediastinal and hilar nodes are present. More confluent soft tissue opacity extending within the subcarinal  space in into the right hilum (9/44-48). Mild focal narrowing of the right mainstem bronchus and bronchus intermedius in this region of soft tissue attenuation. Possibly reflecting conglomerate nodal tissue versus direct extension of a mass. Tiny diverticulum seen arising from the inferomedial aspect of the left mainstem bronchus. No other acute abnormality of the central airways. Difficult to discern the margins of the distal thoracic esophagus from the soft tissue in the subcarinal region. Thyroid gland and thoracic inlet are unremarkable. Lungs/Pleura: Dense masslike consolidation measuring approximately 7.7 x 8.2 cm (11/35) is seen in the posterior segment right upper lobe abutting the major fissure with confluent soft tissue attenuation extending into the right hilum and subcarinal region which is highly worrisome for an underlying mass lesion particularly given narrowing of the airways and pulmonary arteries in this location. However, there are additional peripheral tree-in-bud opacities and multifocal mixed ground-glass and consolidation elsewhere throughout the lungs. No visible pneumothorax or effusion. Background of centrilobular emphysema is noted towards the apices. Upper Abdomen: No acute abnormalities present in the visualized portions of the upper abdomen. Musculoskeletal:  No acute osseous abnormality or suspicious osseous lesion. Multilevel degenerative changes are present in the imaged portions of the spine. Additional degenerative changes at the left sternoclavicular joint and bilateral shoulders. Review of the MIP images confirms the above findings. IMPRESSION: 1. Segmental and subsegmental pulmonary artery emboli in the pulmonary arteries of the posterior segment right upper lobe extending towards the ostial origin from the left upper lobar pulmonary artery. No CT evidence of right heart strain. 2. Dense masslike consolidation measuring approximately 7.7 x 8.2 cm in the posterior segment right upper lobe abutting the major fissure with confluent soft tissue attenuation extending into the right hilum and subcarinal region which is highly worrisome for an underlying mass lesion particularly given narrowing of the airways and pulmonary arteries in this location. While dense infectious consolidation or large pulmonary infarct could have a similar appearance high suspicion should be maintained with continued follow-up imaging following acute intervention. Surrounding areas of ground-glass and tree-in-bud nodularity could reflect some postobstructive pneumonia given truncation the airways entering this region. 3. Additional multifocal areas of mixed consolidation and ground-glass opacity compatible with atypical pneumonia in the setting of known COVID-19 positivity. 4. Additional subcentimeter mediastinal and hilar nodes, likely reactive though metastatic disease is not excluded. 5. Aortic Atherosclerosis (ICD10-I70.0). These results were called by telephone at the time of interpretation on 08/11/2019 at 10:48 pm to provider GARRETT GREEN , who verbally acknowledged these results. Electronically Signed   By: Kreg Shropshire M.D.   On: 08/11/2019 22:49   DG Chest Port 1 View  Result Date: 08/11/2019 CLINICAL DATA:  Fall shortness of breath, hypoxia, COVID-19 positive EXAM: PORTABLE  CHEST 1 VIEW COMPARISON:  Radiograph 10/24/2013 FINDINGS: Focal opacity is seen in the right mid lung, likely in the upper lobe. More mixed patchy and interstitial opacities are seen in the lung bases and periphery. No pneumothorax or visible effusion. Mild cardiomegaly though possibly accentuated by portable technique. Remaining cardiomediastinal contours are unremarkable. No acute osseous or soft tissue abnormality. Telemetry leads overlie the chest. IMPRESSION: 1. Focal opacity in the right mid lung, likely in the upper lobe, suspicious for pneumonia. 2. Additional patchy and interstitial opacities in the lung bases and periphery may reflect additional areas of infection in the setting of COVID-19. Electronically Signed   By: Kreg Shropshire M.D.   On: 08/11/2019 15:38   ECHOCARDIOGRAM COMPLETE  Result Date: 08/12/2019    ECHOCARDIOGRAM  REPORT   Patient Name:   COLLEEN DONAHOE Date of Exam: 08/12/2019 Medical Rec #:  [id number]     Height:       66.0 in Accession #:    [id number]    Weight:       232.8 lb Date of Birth:  December 30, 1960     BSA:          2.133 m Patient Age:    70 years      BP:           90/75 mmHg Patient Gender: M             HR:           98 bpm. Exam Location:  Inpatient Procedure: 2D Echo Indications:    415.19 pulmonary embolus  History:        Patient has prior history of Echocardiogram examinations, most                 recent 06/26/2019. Cardiomyopathy; Risk Factors:Hypertension,                 Diabetes, Dyslipidemia and Former Smoker.  Sonographer:    Jannett Celestine RDCS (AE) Referring Phys: [id number] Sherryll Burger [facility]  Sonographer Comments: Suboptimal apical window. Image acquisition challenging due to patient body habitus and Image acquisition challenging due to respiratory motion. very limited mobility IMPRESSIONS  1. Left ventricular ejection fraction, by estimation, is <20%. The left ventricle has severely decreased function. The left ventricle demonstrates global hypokinesis. The left  ventricular internal cavity size was moderately dilated. Left ventricular diastolic function could not be evaluated.  2. Right ventricular systolic function is severely reduced. The right ventricular size is moderately enlarged. Tricuspid regurgitation signal is inadequate for assessing PA pressure.  3. Left atrial size was mild to moderately dilated.  4. Right atrial size was mildly dilated.  5. The mitral valve is degenerative. Trivial mitral valve regurgitation. No evidence of mitral stenosis.  6. The aortic valve is tricuspid. Aortic valve regurgitation is not visualized. Mild aortic valve sclerosis is present, with no evidence of aortic valve stenosis.  7. The inferior vena cava is dilated in size with <50% respiratory variability, suggesting right atrial pressure of 15 mmHg. Conclusion(s)/Recommendation(s): Probable moderate sized thrombus in LV apex. Recommend definity contrast to further evaulated extent. FINDINGS  Left Ventricle: Left ventricular ejection fraction, by estimation, is <20%. The left ventricle has severely decreased function. The left ventricle demonstrates global hypokinesis. The left ventricular internal cavity size was moderately dilated. There is no left ventricular hypertrophy. Left ventricular diastolic function could not be evaluated. Right Ventricle: The right ventricular size is moderately enlarged. No increase in right ventricular wall thickness. Right ventricular systolic function is severely reduced. Tricuspid regurgitation signal is inadequate for assessing PA pressure. Left Atrium: Left atrial size was mild to moderately dilated. Right Atrium: Right atrial size was mildly dilated. Pericardium: There is no evidence of pericardial effusion. Mitral Valve: The mitral valve is degenerative in appearance. There is mild thickening of the mitral valve leaflet(s). Normal mobility of the mitral valve leaflets. Mild to moderate mitral annular calcification. Trivial mitral valve  regurgitation. No evidence of mitral valve stenosis. Tricuspid Valve: The tricuspid valve is normal in structure. Tricuspid valve regurgitation is trivial. No evidence of tricuspid stenosis. Aortic Valve: The aortic valve is tricuspid. Aortic valve regurgitation is not visualized. Mild aortic valve sclerosis is present, with no evidence of aortic valve stenosis. Pulmonic Valve: The pulmonic valve was not well  visualized. Pulmonic valve regurgitation is trivial. No evidence of pulmonic stenosis. Aorta: The aortic root is normal in size and structure. Venous: The inferior vena cava is dilated in size with less than 50% respiratory variability, suggesting right atrial pressure of 15 mmHg. IAS/Shunts: No atrial level shunt detected by color flow Doppler.  LEFT VENTRICLE PLAX 2D LVIDd:         6.10 cm LVIDs:         5.60 cm LV PW:         1.20 cm LV IVS:        0.90 cm LVOT diam:     2.30 cm LV SV:         38 LV SV Index:   18 LVOT Area:     4.15 cm  RIGHT VENTRICLE TAPSE (M-mode): 1.1 cm LEFT ATRIUM           Index       RIGHT ATRIUM           Index LA diam:      3.50 cm 1.64 cm/m  RA Area:     19.40 cm LA Vol (A2C): 92.6 ml 43.41 ml/m RA Volume:   58.70 ml  27.52 ml/m LA Vol (A4C): 21.9 ml 10.27 ml/m  AORTIC VALVE LVOT Vmax:   62.70 cm/s LVOT Vmean:  46.100 cm/s LVOT VTI:    0.090 m  AORTA Ao Root diam: 3.60 cm  SHUNTS Systemic VTI:  0.09 m Systemic Diam: 2.30 cm Armanda Magic MD Electronically signed by Armanda Magic MD Signature Date/Time: 08/12/2019/2:29:09 PM    Final    [place] EKG SITE RITE  Result Date: 08/13/2019 If Site Rite image not attached, placement could not be confirmed due to current cardiac rhythm.  US Abdomen Limited RUQ  Result Date: 08/12/2019 CLINICAL DATA:  Abnormal LFTs EXAM: ULTRASOUND ABDOMEN LIMITED RIGHT UPPER QUADRANT COMPARISON:  None. FINDINGS: Gallbladder: The gallbladder appears to be filled with slightly a hyperechoic sludge. No sonographic Murphy sign. The gallbladder wall  thickness measures 3 mm. No pericholecystic fluid. Common bile duct: Diameter: 5 mm Liver: No focal lesion identified. Within normal limits in parenchymal echogenicity. Portal vein is patent on color Doppler imaging with normal direction of blood flow towards the liver. Other: None. IMPRESSION: Gallbladder sludge.  No definite evidence of cholecystitis. Electronically Signed   By: Jonna Clark M.D.   On: 08/12/2019 01:58

## 2019-08-13 NOTE — Plan of Care (Signed)
Patient A&O x2-3, disorient but easily reorient. Air borne/contact precautions maintained.VSS. Pt on 2 L of oxygen sating above 93%. Lungs sounds labored and  Diminished.BG managed, hypoglycemic x1 ( see previous note). Free from falls. Frequent turns in bed. Pt tolerating fluids and meals.  Pt voiding adequately during shift.DVT prophylactic: lovenox. No c/o of pain. Bed wheels locked. Phone and call bell within reach. Bed alarm on. Pt is resting, no distress. POC reviewed.    Problem: Education: Goal: Knowledge of General Education information will improve Description: Including pain rating scale, medication(s)/side effects and non-pharmacologic comfort measures Outcome: Progressing   Problem: Health Behavior/Discharge Planning: Goal: Ability to manage health-related needs will improve Outcome: Progressing   Problem: Clinical Measurements: Goal: Ability to maintain clinical measurements within normal limits will improve Outcome: Progressing Goal: Will remain free from infection Outcome: Progressing Goal: Diagnostic test results will improve Outcome: Progressing Goal: Respiratory complications will improve Outcome: Progressing Goal: Cardiovascular complication will be avoided Outcome: Progressing   Problem: Activity: Goal: Risk for activity intolerance will decrease Outcome: Progressing   Problem: Nutrition: Goal: Adequate nutrition will be maintained Outcome: Progressing   Problem: Coping: Goal: Level of anxiety will decrease Outcome: Progressing   Problem: Elimination: Goal: Will not experience complications related to bowel motility Outcome: Progressing Goal: Will not experience complications related to urinary retention Outcome: Progressing   Problem: Pain Managment: Goal: General experience of comfort will improve Outcome: Progressing   Problem: Safety: Goal: Ability to remain free from injury will improve Outcome: Progressing   Problem: Skin Integrity: Goal:  Risk for impaired skin integrity will decrease Outcome: Progressing   Problem: Education: Goal: Knowledge of risk factors and measures for prevention of condition will improve Outcome: Progressing   Problem: Coping: Goal: Psychosocial and spiritual needs will be supported Outcome: Progressing   Problem: Respiratory: Goal: Will maintain a patent airway Outcome: Progressing Goal: Complications related to the disease process, condition or treatment will be avoided or minimized Outcome: Progressing

## 2019-08-13 NOTE — Consult Note (Addendum)
[facility] for Infectious Disease       Reason for Consult: MSSA bacteremia    Referring Physician: CHAMP autoconsult  Principal Problem:   Sepsis due to COVID-19 [facility]) Active Problems:   Essential hypertension   Chronic combined systolic and diastolic congestive heart failure ([place])   Uncontrolled type 2 diabetes mellitus with hyperglycemia, with long-term current use of insulin (HCC)   Acute metabolic encephalopathy   Acute respiratory failure with hypoxemia (HCC)   Lactic acidosis   Acute pulmonary embolism without acute cor pulmonale (HCC)   AKI (acute kidney injury) ([place])   Mass of upper lobe of right lung   . dexamethasone (DECADRON) injection  10 mg Intravenous Q24H  . enoxaparin (LOVENOX) injection  105 mg Subcutaneous Q12H  . insulin aspart  0-20 Units Subcutaneous TID WC  . insulin aspart  0-5 Units Subcutaneous QHS  . insulin glargine  25 Units Subcutaneous QHS  . pneumococcal 23 valent vaccine  0.5 mL Intramuscular Tomorrow-1000    Recommendations: Continue cefazolin Repeat blood cultures tomorrow TEE when able Continue COVID treatment   Assessment: He has a recent COVID infection and no worsened with MSSA bacteremia.  I suspect his pneumonia is related to the bacteremia.  I do recommend a TEE when able (he will be 21 days out on 5/19 from initial COVID-19 diagnosis).   Also with a thrombus in LV apex.  May need TEE or other imaging per cardiology.    Antibiotics: Cefazolin remdesivir  HPI: Kevin Long is a 59 y.o. 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  Initially with high oxygen needs and now with positive blood cultures with MSSA bacteremia.  His main complaint now is a dry mouth.  Initially with some encephalopathy and CT chest with PE, hilar lymphadenopathy and pulmonary consolidation.   He is a bit improved today.  No other complaints.    Review of Systems:  Constitutional: negative for anorexia Gastrointestinal: negative for diarrhea Integument/breast: negative for rash All other systems reviewed and are negative    Past Medical History:  Diagnosis Date  . Asthma   . Diabetes mellitus, type 2 ([place])   . Hypercholesterolemia   . Hypertension   . Obesity   . OSA (obstructive sleep apnea)   . Other primary cardiomyopathies     Social History   Tobacco Use  . Smoking status: Former Smoker    Quit date: 03/31/2005    Years since quitting: 14.3  . Smokeless tobacco: Never Used  Substance Use Topics  . Alcohol use: No  . Drug use: No    Family History  Problem Relation Age of Onset  . Diabetes Mother   . Anxiety disorder Brother   . Depression Son   . Heart murmur Son   . Heart attack Neg Hx   . Hypertension Neg Hx   . Stroke Neg Hx     Allergies  Allergen Reactions  . Celebrex [Celecoxib] Swelling    Swelling in face  . Entresto [Sacubitril-Valsartan] Other (See Comments)    Dizziness    Physical Exam: Constitutional: in no apparent distress  Vitals:   08/13/19 1004 08/13/19 1200  BP: (!) 123/91 114/85  Pulse: 90 92  Resp: (!) 30 (!) 25  Temp: 97.6 F (36.4 C) 98.8 F (37.1 C)  SpO2: 92% 90%   EYES: anicteric Cardiovascular: Cor  RRR Respiratory: clear; GI: obese Musculoskeletal: no pedal edema noted Skin: negatives: no rash Hematologic: no cervical lad  Lab Results  Component Value Date   WBC 12.8 (H) 08/13/2019   HGB 14.8 08/13/2019   HCT 48.7 08/13/2019   MCV 87.9 08/13/2019   PLT 375 08/13/2019    Lab Results  Component Value Date   CREATININE 1.83 (H) 08/13/2019   BUN 30 (H) 08/13/2019   NA 142 08/13/2019   K 5.1 08/13/2019   CL 107 08/13/2019   CO2 17 (L) 08/13/2019    Lab Results  Component Value Date   ALT 27 08/13/2019   AST 81 (H) 08/13/2019   ALKPHOS 75 08/13/2019     Microbiology: Recent Results  (from the past 240 hour(s))  Blood Culture (routine x 2)     Status: Abnormal (Preliminary result)   Collection Time: 08/11/19  3:58 PM   Specimen: BLOOD RIGHT ARM  Result Value Ref Range Status   Specimen Description BLOOD RIGHT ARM  Final   Special Requests   Final    BOTTLES DRAWN AEROBIC AND ANAEROBIC Blood Culture adequate volume   Culture  Setup Time   Final    GRAM POSITIVE COCCI IN CLUSTERS ANAEROBIC BOTTLE ONLY CRITICAL RESULT CALLED TO, READ BACK BY AND VERIFIED WITHIhor Austin PHARMD 1826 08/12/19 A BROWNING    Culture (A)  Final    STAPHYLOCOCCUS AUREUS SUSCEPTIBILITIES TO FOLLOW Performed at [facility], [address] [place], [place] [address]    Report Status PENDING  Incomplete  Blood Culture ID Panel (Reflexed)     Status: Abnormal   Collection Time: 08/11/19  3:58 PM  Result Value Ref Range Status   Enterococcus species NOT DETECTED NOT DETECTED Final   Listeria monocytogenes NOT DETECTED NOT DETECTED Final   Staphylococcus species DETECTED (A) NOT DETECTED Final    Comment: CRITICAL RESULT CALLED TO, READ BACK BY AND VERIFIED WITH: Ihor Austin PHARMD 1826 08/12/19 A BROWNING    Staphylococcus aureus (BCID) DETECTED (A) NOT DETECTED Final    Comment: Methicillin (oxacillin) susceptible Staphylococcus aureus (MSSA). Preferred therapy is anti staphylococcal beta lactam antibiotic (Cefazolin or Nafcillin), unless clinically contraindicated. CRITICAL RESULT CALLED TO, READ BACK BY AND VERIFIED WITH: Ihor Austin PHARMD 1826 08/12/19 A BROWNING    Methicillin resistance NOT DETECTED NOT DETECTED Final   Streptococcus species NOT DETECTED NOT DETECTED Final   Streptococcus agalactiae NOT DETECTED NOT DETECTED Final   Streptococcus pneumoniae NOT DETECTED NOT DETECTED Final   Streptococcus pyogenes NOT DETECTED NOT DETECTED Final   Acinetobacter baumannii NOT DETECTED NOT DETECTED Final   Enterobacteriaceae species NOT DETECTED NOT DETECTED Final   Enterobacter  cloacae complex NOT DETECTED NOT DETECTED Final   Escherichia coli NOT DETECTED NOT DETECTED Final   Klebsiella oxytoca NOT DETECTED NOT DETECTED Final   Klebsiella pneumoniae NOT DETECTED NOT DETECTED Final   Proteus species NOT DETECTED NOT DETECTED Final   Serratia marcescens NOT DETECTED NOT DETECTED Final   Haemophilus influenzae NOT DETECTED NOT DETECTED Final   Neisseria meningitidis NOT DETECTED NOT DETECTED Final   Pseudomonas aeruginosa NOT DETECTED NOT DETECTED Final   Candida albicans NOT DETECTED NOT DETECTED Final   Candida glabrata NOT DETECTED NOT DETECTED Final   Candida krusei NOT DETECTED NOT DETECTED Final   Candida parapsilosis NOT DETECTED NOT DETECTED Final   Candida tropicalis NOT DETECTED NOT DETECTED Final    Comment: Performed at [facility], [address], [place], [place]  [address]    Gardiner Barefoot, MD [email or web address] 08/13/2019, 1:15 PM

## 2019-08-13 NOTE — Progress Notes (Signed)
Text chat with Dr. Jerral Ralph and Denny Peon, RN concerning PICC order. Patient has positive blood cultures. PICC will be discontinued. Patient has one PIV, which is working well per Denny Peon, Charity fundraiser, and IV medications ordered can go through the one. Lurline Idol, RN if need increased and unable to obtain another IV site, to place a consult.

## 2019-08-14 ENCOUNTER — Inpatient Hospital Stay (HOSPITAL_COMMUNITY): Payer: Medicaid Other

## 2019-08-14 LAB — CBC WITH DIFFERENTIAL/PLATELET
Abs Immature Granulocytes: 0.17 10*3/uL — ABNORMAL HIGH (ref 0.00–0.07)
Basophils Absolute: 0 10*3/uL (ref 0.0–0.1)
Basophils Relative: 0 %
Eosinophils Absolute: 0 10*3/uL (ref 0.0–0.5)
Eosinophils Relative: 0 %
HCT: 45.7 % (ref 39.0–52.0)
Hemoglobin: 14.4 g/dL (ref 13.0–17.0)
Immature Granulocytes: 1 %
Lymphocytes Relative: 8 %
Lymphs Abs: 1 10*3/uL (ref 0.7–4.0)
MCH: 26.8 pg (ref 26.0–34.0)
MCHC: 31.5 g/dL (ref 30.0–36.0)
MCV: 84.9 fL (ref 80.0–100.0)
Monocytes Absolute: 0.7 10*3/uL (ref 0.1–1.0)
Monocytes Relative: 6 %
Neutro Abs: 11.2 10*3/uL — ABNORMAL HIGH (ref 1.7–7.7)
Neutrophils Relative %: 85 %
Platelets: 400 10*3/uL (ref 150–400)
RBC: 5.38 MIL/uL (ref 4.22–5.81)
RDW: 15.9 % — ABNORMAL HIGH (ref 11.5–15.5)
WBC: 13.2 10*3/uL — ABNORMAL HIGH (ref 4.0–10.5)
nRBC: 0 % (ref 0.0–0.2)

## 2019-08-14 LAB — COMPREHENSIVE METABOLIC PANEL
ALT: 20 U/L (ref 0–44)
AST: 43 U/L — ABNORMAL HIGH (ref 15–41)
Albumin: 2.3 g/dL — ABNORMAL LOW (ref 3.5–5.0)
Alkaline Phosphatase: 93 U/L (ref 38–126)
Anion gap: 13 (ref 5–15)
BUN: 25 mg/dL — ABNORMAL HIGH (ref 6–20)
CO2: 25 mmol/L (ref 22–32)
Calcium: 8.5 mg/dL — ABNORMAL LOW (ref 8.9–10.3)
Chloride: 99 mmol/L (ref 98–111)
Creatinine, Ser: 1.36 mg/dL — ABNORMAL HIGH (ref 0.61–1.24)
GFR calc Af Amer: >60 mL/min (ref >60)
GFR calc non Af Amer: 56 mL/min — ABNORMAL LOW (ref >60)
Glucose, Bld: 87 mg/dL (ref 70–99)
Potassium: 5 mmol/L (ref 3.5–5.1)
Sodium: 137 mmol/L (ref 135–145)
Total Bilirubin: 1.6 mg/dL — ABNORMAL HIGH (ref 0.3–1.2)
Total Protein: 6.7 g/dL (ref 6.5–8.1)

## 2019-08-14 LAB — CULTURE, BLOOD (ROUTINE X 2): Special Requests: ADEQUATE

## 2019-08-14 LAB — PROCALCITONIN: Procalcitonin: 5.06 ng/mL

## 2019-08-14 LAB — GLUCOSE, CAPILLARY
Glucose-Capillary: 82 mg/dL (ref 70–99)
Glucose-Capillary: 188 mg/dL — ABNORMAL HIGH (ref 70–99)
Glucose-Capillary: 283 mg/dL — ABNORMAL HIGH (ref 70–99)
Glucose-Capillary: 331 mg/dL — ABNORMAL HIGH (ref 70–99)

## 2019-08-14 LAB — URINE CULTURE: Culture: NO GROWTH

## 2019-08-14 LAB — D-DIMER, QUANTITATIVE: D-Dimer, Quant: 5.09 ug/mL-FEU — ABNORMAL HIGH (ref 0.00–0.50)

## 2019-08-14 LAB — HEPARIN LEVEL (UNFRACTIONATED): Heparin Unfractionated: 0.96 IU/mL — ABNORMAL HIGH (ref 0.30–0.70)

## 2019-08-14 LAB — MAGNESIUM: Magnesium: 2.1 mg/dL (ref 1.7–2.4)

## 2019-08-14 LAB — FERRITIN: Ferritin: 1060 ng/mL — ABNORMAL HIGH (ref 24–336)

## 2019-08-14 LAB — C-REACTIVE PROTEIN: CRP: 18.6 mg/dL — ABNORMAL HIGH (ref <1.0)

## 2019-08-14 LAB — PHOSPHORUS: Phosphorus: 4.1 mg/dL (ref 2.5–4.6)

## 2019-08-14 MED ORDER — TORSEMIDE 20 MG PO TABS
40.0000 mg | ORAL_TABLET | Freq: Every day | ORAL | Status: DC
  Administered 2019-08-14: 40 mg via ORAL
  Filled 2019-08-14 (×2): qty 2

## 2019-08-14 MED ORDER — METHYLPREDNISOLONE SODIUM SUCC 40 MG IJ SOLR
40.0000 mg | Freq: Two times a day (BID) | INTRAMUSCULAR | Status: DC
  Administered 2019-08-14 – 2019-08-15 (×3): 40 mg via INTRAVENOUS
  Filled 2019-08-14 (×3): qty 1

## 2019-08-14 NOTE — Progress Notes (Signed)
Inpatient Rehab Admissions Coordinator Note:   Per therapy recommendations, pt was screened for CIR candidacy by Megan Salon, MS CCC-SLP. At this time, Pt. Appears to have functional decline and is a good candidate for CIR. Will place order for rehab consult per protocol. Pt. Is COVID+, would not be able to admit until he is 21 days post diagnosis ( 08/18/19) Please contact me with questions.   Megan Salon, MS, CCC-SLP Rehab Admissions Coordinator  [phone] (celll) [phone] (office)

## 2019-08-14 NOTE — Progress Notes (Signed)
PROGRESS NOTE                                                                                                                                                                                                             Patient Demographics:    Kevin Long, is a 59 y.o. male, DOB - Aug 20, 1960, ZYS:[id number]  Outpatient Primary MD for the patient is Nolene Ebbs, MD   Admit date - 08/11/2019   LOS - 2  Chief Complaint  Patient presents with  . Sepsis alert, Covid pos       Brief Narrative: Patient is a 59 y.o. male with PMHx of chronic systolic heart failure, HTN, DM-2-, OSA noncompliant to CPAP who presented with shortness of breath-found to have acute hypoxic respiratory failure secondary to PE, COVID-19 pneumonia, lung mass, decompensated heart failure and possible bacterial pneumonia.  Post admission-blood cultures positive for MSSA.  See below for further details.  Significant Events: 4/29>> COVID-19 positive at [facility] (results available in care everywhere) 5/13>> admit to [facility] for hypoxia-CT chest with lung mass, postobstructive pneumonia, groundglass opacities and PE.  Significant studies 5/13>> CT chest: 7.7x8.2 masslike consolidation in the right upper lobe with surrounding groundglass/tree-in-bud nodularity reflecting postobstructive pneumonia, additional areas of multifocal/groundglass pneumonia, segmental/subsegmental PE in the right upper lobe.  5/14>> TTE: XN<23%, RV systolic function severely reduced  COVID-19 medications: Steroids: 5/13>> Remdesivir: 5/13>>  Antibiotics: Ancef: 5/14>> Unasyn: 5/14>>5/14 Zithromax: 5/13>>5/15 Ceftriaxone: 5/13 x 1  Microbiology data: 5/16: Blood culture>> pending 5/13: Blood culture>> 1/2 MSSA  5/13: Urine culture>> no growth   DVT prophylaxis: Therapeutic Lovenox  Procedures: None  Consults: None   Subjective:   Slowly continues to improve-relatively  awake/alert this morning-answering all my questions appropriately.  Breathing is better-he is down to 2 L of oxygen.   Assessment  & Plan :   Acute Hypoxic Resp Failure due to Covid 19 Viral pneumonia, postobstructive pneumonia and pulmonary embolism: Continues to improve-stable on 2 L of oxygen.  Remains on steroids/remdesivir/Ancef in full dose anticoagulation.     He is not a candidate for Actemra given concurrent bacterial infection.  Fever: afebrile Incentive Spirometry: encouraged incentive spirometry use 3-4/hour. O2 requirements:  SpO2: 95 % O2 Flow Rate (L/min): 2 L/min   COVID-19 Labs: Recent Labs    08/11/19 1614 08/11/19 1614 08/12/19 0826 08/12/19 0922 08/13/19  1032 08/14/19 0641  DDIMER 11.45*   < > 6.86*  --  3.62* 5.09*  FERRITIN 469*   < >  --  557* 1,224* 1,060*  LDH 542*  --   --   --   --   --   CRP 15.6*   < >  --  29.9* 28.7* 18.6*   < > = values in this interval not displayed.       Component Value Date/Time   BNP 701.0 (H) 08/13/2019 1032    Recent Labs  Lab 08/11/19 1614 08/12/19 0922 08/13/19 1032 08/14/19 0641  PROCALCITON 0.73 6.16 5.22 5.06    No results found for: SARSCOV2NAA   MSSA bacteremia: Remains on Ancef-awaiting repeat blood cultures done on 5/16. TTE on 5/14-no obvious vegetation.  Once he completes 21 days of isolation on 5/19-we will discuss with cardiology if TEE can be pursued.  ID following.  Acute on chronic systolic heart failure (EF 25-30% by TTE on 11/19/2016): Volume status much better-hypoxia improving-on 2 L of oxygen.  Has trace lower extremity edema-we will start Demadex 40 mg daily.  Continue to hold beta-blockers to allow more room for diuresis.  Cardiology following.  Acute metabolic encephalopathy: Secondary to hypoxemia/bacteremia.  Improving with treatment of the underlying etiologies.  He continues to be much more awake and alert than his initial presentation.  AKI: Likely hemodynamically mediated in the  setting of COVID-19-UA negative for proteinuria-improving with supportive care.  Watch closely while he is on diuretics.  Lung mass: Currently situation is very tenuous-with respiratory failure-we will treat underlying pneumonia/PE/CHF-if he stabilizes-at some point he will probably need either repeat imaging or bronchoscopy with biopsy.  He is a ex-smoker-quit 2009.  Elevated troponin: Probably demand ischemia in the setting of PE/hypoxemia-recheck troponin levels later this afternoon.  EKG nonacute.  Outpatient cardiology note-last LHC in 2014 revealed normal coronary arteries.  HTN: Beta-blockers on hold-to allow more room for diuresis.  Hopeful to resume in the next few days.  Bilateral foot pain: Ongoing for the past 1-2 months-apparently per patient-he stopped ambulating due to pain.  No obvious swelling/erythema-pain is mostly in his toes bilaterally.  Check uric acid level-but no response to steroids as of now.  We will go and get plain x-rays as well.  Insulin-dependent DM-2 with uncontrolled hyperglycemia due to poor outpatient control worsened by steroids (A1c 12.4 on 08/12/2019): No further hypoglycemic episodes-CBG stable on Lantus 25 units and SSI.    Recent Labs    08/13/19 2126 08/14/19 0724 08/14/19 1136  GLUCAP 99 82 188*   OSA: Claims that his machine broke several months ago-we will try to place him on CPAP nightly if he tolerates here in the hospital.  Morbid Obesity: Estimated body mass index is 38.15 kg/m as calculated from the following:   Height as of this encounter:  (1.676 m).   Weight as of this encounter: 107.2 kg.    ABG:    Component Value Date/Time   PHART 7.484 (H) 08/12/2019 1023   PCO2ART 25.6 (L) 08/12/2019 1023   PO2ART 83.8 08/12/2019 1023   HCO3 19.1 (L) 08/12/2019 1023   TCO2 16 (L) 08/11/2019 2349   ACIDBASEDEF 3.9 (H) 08/12/2019 1023   O2SAT 97.6 08/12/2019 1023    Vent Settings: N/A   Condition - Extremely Guarded-very tenuous  with risk for further deterioration  Family Communication  :  significant other updated over the phone [phone])  Code Status :  DNR-explained to patient today as he  is much more awake and alert-he understands and agreeable.  Had discussed this with his spouse/significant other again today.  Had spoken with son yesterday.    Diet :  Diet Order            Diet heart healthy/carb modified Room service appropriate? Yes; Fluid consistency: Thin; Fluid restriction: 1500 mL Fluid  Diet effective now               Disposition Plan  :   Status is: Inpatient  Remains inpatient appropriate because:Inpatient level of care appropriate due to severity of illness  Dispo: The patient is from: Home              Anticipated d/c is to: Home              Anticipated d/c date is: > 3 days              Patient currently is not medically stable to d/c.   Barriers to discharge: Severe hypoxia requiring O2 supplementation-on IV antimicrobial therapy-AKI-MSSA bacteremia  Antimicorbials  :    Anti-infectives (From admission, onward)   Start     Dose/Rate Route Frequency Ordered Stop   08/13/19 1000  remdesivir 100 mg in sodium chloride 0.9 % 100 mL IVPB     100 mg 200 mL/hr over 30 Minutes Intravenous Daily 08/11/19 2347 08/17/19 0959   08/13/19 0600  ceFAZolin (ANCEF) IVPB 2g/100 mL premix     2 g 200 mL/hr over 30 Minutes Intravenous Every 8 hours 08/12/19 2248     08/12/19 2200  ceFAZolin (ANCEF) IVPB 2g/100 mL premix     2 g 200 mL/hr over 30 Minutes Intravenous Every 8 hours 08/12/19 1840 08/12/19 2218   08/12/19 1800  azithromycin (ZITHROMAX) 500 mg in sodium chloride 0.9 % 250 mL IVPB  Status:  Discontinued     500 mg 250 mL/hr over 60 Minutes Intravenous Every 24 hours 08/12/19 0709 08/13/19 1223   08/12/19 0800  Ampicillin-Sulbactam (UNASYN) 3 g in sodium chloride 0.9 % 100 mL IVPB  Status:  Discontinued     3 g 200 mL/hr over 30 Minutes Intravenous Every 6 hours 08/12/19 0730  08/12/19 1840   08/12/19 0030  remdesivir 200 mg in sodium chloride 0.9% 250 mL IVPB     200 mg 580 mL/hr over 30 Minutes Intravenous Once 08/11/19 2347 08/12/19 0219   08/11/19 1700  cefTRIAXone (ROCEPHIN) 1 g in sodium chloride 0.9 % 100 mL IVPB     1 g 200 mL/hr over 30 Minutes Intravenous  Once 08/11/19 1649 08/11/19 1756   08/11/19 1700  azithromycin (ZITHROMAX) 500 mg in sodium chloride 0.9 % 250 mL IVPB     500 mg 250 mL/hr over 60 Minutes Intravenous  Once 08/11/19 1649 08/11/19 2054      Inpatient Medications  Scheduled Meds: . enoxaparin (LOVENOX) injection  105 mg Subcutaneous Q12H  . insulin aspart  0-20 Units Subcutaneous TID WC  . insulin aspart  0-5 Units Subcutaneous QHS  . insulin glargine  25 Units Subcutaneous QHS  . methylPREDNISolone (SOLU-MEDROL) injection  40 mg Intravenous Q12H  . pneumococcal 23 valent vaccine  0.5 mL Intramuscular Tomorrow-1000  . torsemide  40 mg Oral Daily   Continuous Infusions: .  ceFAZolin (ANCEF) IV 2 g (08/14/19 0840)  . remdesivir 100 mg in NS 100 mL 100 mg (08/14/19 1026)   PRN Meds:.acetaminophen **OR** acetaminophen, albuterol, chlorpheniramine-HYDROcodone, guaiFENesin-dextromethorphan, ondansetron **OR** ondansetron (ZOFRAN) IV, polyethylene glycol  Time Spent in minutes 35   See all Orders from today for further details   Jeoffrey Massed M.D on 08/14/2019 at 11:57 AM  To page go to [email or web address] - use universal password  [facility] -  Office  [phone]    Objective:   Vitals:   08/14/19 0400 08/14/19 0500 08/14/19 0726 08/14/19 1138  BP: 103/72  105/81 104/73  Pulse: 90  92 92  Resp: 18  (!) 21 (!) 22  Temp: 98.7 F (37.1 C)  98.3 F (36.8 C) 97.9 F (36.6 C)  TempSrc: Oral  Axillary   SpO2: 95%  96% 95%  Weight:  107.2 kg    Height:        Wt Readings from Last 3 Encounters:  08/14/19 107.2 kg  06/22/19 118.7 kg  12/02/16 124.4 kg     Intake/Output Summary (Last 24 hours) at 08/14/2019  1157 Last data filed at 08/14/2019 1035 Gross per 24 hour  Intake 1761.52 ml  Output 700 ml  Net 1061.52 ml     Physical Exam Gen Exam:Alert awake-not in any distress HEENT:atraumatic, normocephalic Chest: B/L clear to auscultation anteriorly CVS:S1S2 regular Abdomen:soft non tender, non distended Extremities:+ edema Neurology: Non focal Skin: no rash   Data Review:    CBC Recent Labs  Lab 08/11/19 1614 08/11/19 2349 08/12/19 0922 08/13/19 1032 08/14/19 0641  WBC 19.9*  --  14.7* 12.8* 13.2*  HGB 15.8 15.6 15.3 14.8 14.4  HCT 51.4 46.0 48.4 48.7 45.7  PLT 345  --  297 375 400  MCV 87.0  --  85.1 87.9 84.9  MCH 26.7  --  26.9 26.7 26.8  MCHC 30.7  --  31.6 30.4 31.5  RDW 16.4*  --  16.3* 16.3* 15.9*  LYMPHSABS 0.6*  --  1.2 0.8 1.0  MONOABS 1.2*  --  0.6 0.7 0.7  EOSABS 0.0  --  0.0 0.0 0.0  BASOSABS 0.1  --  0.0 0.0 0.0    Chemistries  Recent Labs  Lab 08/11/19 1614 08/11/19 2349 08/12/19 0922 08/13/19 1032 08/14/19 0641  NA 139 134* 140 142 137  K 5.0 4.6 4.5 5.1 5.0  CL 100  --  107 107 99  CO2 18*  --  19* 17* 25  GLUCOSE 392*  --  157* 221* 87  BUN 11  --  14 30* 25*  CREATININE 1.65*  --  1.53* 1.83* 1.36*  CALCIUM 8.9  --  8.8* 8.6* 8.5*  MG  --   --  1.9 2.2 2.1  AST 24  --  24 81* 43*  ALT 23  --  18 27 20   ALKPHOS 68  --  63 75 93  BILITOT 3.3*  --  1.6* 1.4* 1.6*   ------------------------------------------------------------------------------------------------------------------ Recent Labs    08/11/19 1615  TRIG 159*    Lab Results  Component Value Date   HGBA1C 12.4 (H) 08/12/2019   ------------------------------------------------------------------------------------------------------------------ No results for input(s): TSH, T4TOTAL, T3FREE, THYROIDAB in the last 72 hours.  Invalid input(s): FREET3 ------------------------------------------------------------------------------------------------------------------ Recent Labs     08/13/19 1032 08/14/19 0641  FERRITIN 1,224* 1,060*    Coagulation profile Recent Labs  Lab 08/11/19 1614 08/12/19 0826  INR 1.7* 2.2*    Recent Labs    08/13/19 1032 08/14/19 0641  DDIMER 3.62* 5.09*    Cardiac Enzymes No results for input(s): CKMB, TROPONINI, MYOGLOBIN in the last 168 hours.  Invalid input(s): CK ------------------------------------------------------------------------------------------------------------------    Component Value Date/Time   BNP 701.0 (H)  08/13/2019 1032    Micro Results Recent Results (from the past 240 hour(s))  Blood Culture (routine x 2)     Status: Abnormal   Collection Time: 08/11/19  3:58 PM   Specimen: BLOOD RIGHT ARM  Result Value Ref Range Status   Specimen Description BLOOD RIGHT ARM  Final   Special Requests   Final    BOTTLES DRAWN AEROBIC AND ANAEROBIC Blood Culture adequate volume   Culture  Setup Time   Final    GRAM POSITIVE COCCI IN CLUSTERS ANAEROBIC BOTTLE ONLY CRITICAL RESULT CALLED TO, READ BACK BY AND VERIFIED WITHIhor Austin PHARMD 1826 08/12/19 A BROWNING Performed at [facility], [address], [place], [place] [address]    Culture STAPHYLOCOCCUS AUREUS (A)  Final   Report Status 08/14/2019 FINAL  Final   Organism ID, Bacteria STAPHYLOCOCCUS AUREUS  Final      Susceptibility   Staphylococcus aureus - MIC*    CIPROFLOXACIN <=0.5 SENSITIVE Sensitive     ERYTHROMYCIN >=8 RESISTANT Resistant     GENTAMICIN <=0.5 SENSITIVE Sensitive     OXACILLIN 0.5 SENSITIVE Sensitive     TETRACYCLINE <=1 SENSITIVE Sensitive     VANCOMYCIN 1 SENSITIVE Sensitive     TRIMETH/SULFA <=10 SENSITIVE Sensitive     CLINDAMYCIN <=0.25 SENSITIVE Sensitive     RIFAMPIN <=0.5 SENSITIVE Sensitive     Inducible Clindamycin NEGATIVE Sensitive     * STAPHYLOCOCCUS AUREUS  Blood Culture ID Panel (Reflexed)     Status: Abnormal   Collection Time: 08/11/19  3:58 PM  Result Value Ref Range Status   Enterococcus species NOT  DETECTED NOT DETECTED Final   Listeria monocytogenes NOT DETECTED NOT DETECTED Final   Staphylococcus species DETECTED (A) NOT DETECTED Final    Comment: CRITICAL RESULT CALLED TO, READ BACK BY AND VERIFIED WITHIhor Austin PHARMD 1826 08/12/19 A BROWNING    Staphylococcus aureus (BCID) DETECTED (A) NOT DETECTED Final    Comment: Methicillin (oxacillin) susceptible Staphylococcus aureus (MSSA). Preferred therapy is anti staphylococcal beta lactam antibiotic (Cefazolin or Nafcillin), unless clinically contraindicated. CRITICAL RESULT CALLED TO, READ BACK BY AND VERIFIED WITH: Ihor Austin PHARMD 1826 08/12/19 A BROWNING    Methicillin resistance NOT DETECTED NOT DETECTED Final   Streptococcus species NOT DETECTED NOT DETECTED Final   Streptococcus agalactiae NOT DETECTED NOT DETECTED Final   Streptococcus pneumoniae NOT DETECTED NOT DETECTED Final   Streptococcus pyogenes NOT DETECTED NOT DETECTED Final   Acinetobacter baumannii NOT DETECTED NOT DETECTED Final   Enterobacteriaceae species NOT DETECTED NOT DETECTED Final   Enterobacter cloacae complex NOT DETECTED NOT DETECTED Final   Escherichia coli NOT DETECTED NOT DETECTED Final   Klebsiella oxytoca NOT DETECTED NOT DETECTED Final   Klebsiella pneumoniae NOT DETECTED NOT DETECTED Final   Proteus species NOT DETECTED NOT DETECTED Final   Serratia marcescens NOT DETECTED NOT DETECTED Final   Haemophilus influenzae NOT DETECTED NOT DETECTED Final   Neisseria meningitidis NOT DETECTED NOT DETECTED Final   Pseudomonas aeruginosa NOT DETECTED NOT DETECTED Final   Candida albicans NOT DETECTED NOT DETECTED Final   Candida glabrata NOT DETECTED NOT DETECTED Final   Candida krusei NOT DETECTED NOT DETECTED Final   Candida parapsilosis NOT DETECTED NOT DETECTED Final   Candida tropicalis NOT DETECTED NOT DETECTED Final    Comment: Performed at [facility], [address], [place], [place] [address]  Urine culture     Status: None    Collection Time: 08/11/19  9:06  PM   Specimen: In/Out Cath Urine  Result Value Ref Range Status   Specimen Description IN/OUT CATH URINE  Final   Special Requests NONE  Final   Culture   Final    NO GROWTH Performed at [facility], [address], [place], [place] [address]    Report Status 08/14/2019 FINAL  Final    Radiology Reports CT Angio Chest PE W/Cm &/Or Wo Cm  Result Date: 08/11/2019 CLINICAL DATA:  Shortness of breath, hypoxia, hemoptysis, recent COVID-19 diagnosis 6 days prior EXAM: CT ANGIOGRAPHY CHEST WITH CONTRAST TECHNIQUE: Multidetector CT imaging of the chest was performed using the standard protocol during bolus administration of intravenous contrast. Multiplanar CT image reconstructions and MIPs were obtained to evaluate the vascular anatomy. CONTRAST:  OMNIPAQUE IOHEXOL 350 MG/ML SOLN COMPARISON:  Same-day radiograph FINDINGS: Cardiovascular: Satisfactory opacification the pulmonary arteries to the segmental level. Evaluation beyond the lobar level is limited due to extensive respiratory motion artifact. However, there is suspected filling defect within the segmental and subsegmental pulmonary arteries of the posterior right lower lobe extending to the ostium of the right upper lobar pulmonary artery. Finding is best seen on coronal MIP imaging series 14, image [id number]. Several of the pulmonary arteries in the region of the superior segment right lower lobe and right middle lobe appear narrowed as the past within the perihilar density detailed below. Central pulmonary arteries are normal caliber. Slight reflux of contrast into the IVC and hepatic veins. Without other convincing CT evidence of right heart strain. Borderline cardiomegaly without pericardial effusion. Atherosclerotic plaque within the normal caliber aorta. Shared origin of the left common carotid and brachiocephalic artery. Minimal plaque in the proximal great vessels. Mediastinum/Nodes: Multiple  subcentimeter mediastinal and hilar nodes are present. More confluent soft tissue opacity extending within the subcarinal space in into the right hilum (9/44-48). Mild focal narrowing of the right mainstem bronchus and bronchus intermedius in this region of soft tissue attenuation. Possibly reflecting conglomerate nodal tissue versus direct extension of a mass. Tiny diverticulum seen arising from the inferomedial aspect of the left mainstem bronchus. No other acute abnormality of the central airways. Difficult to discern the margins of the distal thoracic esophagus from the soft tissue in the subcarinal region. Thyroid gland and thoracic inlet are unremarkable. Lungs/Pleura: Dense masslike consolidation measuring approximately 7.7 x 8.2 cm (11/35) is seen in the posterior segment right upper lobe abutting the major fissure with confluent soft tissue attenuation extending into the right hilum and subcarinal region which is highly worrisome for an underlying mass lesion particularly given narrowing of the airways and pulmonary arteries in this location. However, there are additional peripheral tree-in-bud opacities and multifocal mixed ground-glass and consolidation elsewhere throughout the lungs. No visible pneumothorax or effusion. Background of centrilobular emphysema is noted towards the apices. Upper Abdomen: No acute abnormalities present in the visualized portions of the upper abdomen. Musculoskeletal: No acute osseous abnormality or suspicious osseous lesion. Multilevel degenerative changes are present in the imaged portions of the spine. Additional degenerative changes at the left sternoclavicular joint and bilateral shoulders. Review of the MIP images confirms the above findings. IMPRESSION: 1. Segmental and subsegmental pulmonary artery emboli in the pulmonary arteries of the posterior segment right upper lobe extending towards the ostial origin from the left upper lobar pulmonary artery. No CT evidence of  right heart strain. 2. Dense masslike consolidation measuring approximately 7.7 x 8.2 cm in the posterior segment right upper lobe abutting the major fissure with confluent  soft tissue attenuation extending into the right hilum and subcarinal region which is highly worrisome for an underlying mass lesion particularly given narrowing of the airways and pulmonary arteries in this location. While dense infectious consolidation or large pulmonary infarct could have a similar appearance high suspicion should be maintained with continued follow-up imaging following acute intervention. Surrounding areas of ground-glass and tree-in-bud nodularity could reflect some postobstructive pneumonia given truncation the airways entering this region. 3. Additional multifocal areas of mixed consolidation and ground-glass opacity compatible with atypical pneumonia in the setting of known COVID-19 positivity. 4. Additional subcentimeter mediastinal and hilar nodes, likely reactive though metastatic disease is not excluded. 5. Aortic Atherosclerosis (ICD10-I70.0). These results were called by telephone at the time of interpretation on 08/11/2019 at 10:48 pm to provider GARRETT GREEN , who verbally acknowledged these results. Electronically Signed   By: Kreg Shropshire M.D.   On: 08/11/2019 22:49   DG Chest Port 1 View  Result Date: 08/11/2019 CLINICAL DATA:  Fall shortness of breath, hypoxia, COVID-19 positive EXAM: PORTABLE CHEST 1 VIEW COMPARISON:  Radiograph 10/24/2013 FINDINGS: Focal opacity is seen in the right mid lung, likely in the upper lobe. More mixed patchy and interstitial opacities are seen in the lung bases and periphery. No pneumothorax or visible effusion. Mild cardiomegaly though possibly accentuated by portable technique. Remaining cardiomediastinal contours are unremarkable. No acute osseous or soft tissue abnormality. Telemetry leads overlie the chest. IMPRESSION: 1. Focal opacity in the right mid lung, likely in the  upper lobe, suspicious for pneumonia. 2. Additional patchy and interstitial opacities in the lung bases and periphery may reflect additional areas of infection in the setting of COVID-19. Electronically Signed   By: Kreg Shropshire M.D.   On: 08/11/2019 15:38   ECHOCARDIOGRAM COMPLETE  Result Date: 08/12/2019    ECHOCARDIOGRAM REPORT   Patient Name:   BERYL TAVIZON Date of Exam: 08/12/2019 Medical Rec #:  [id number]     Height:       66.0 in Accession #:    [id number]    Weight:       232.8 lb Date of Birth:  11/06/1960     BSA:          2.133 m Patient Age:    58 years      BP:           90/75 mmHg Patient Gender: M             HR:           98 bpm. Exam Location:  Inpatient Procedure: 2D Echo Indications:    415.19 pulmonary embolus  History:        Patient has prior history of Echocardiogram examinations, most                 recent 06/26/2019. Cardiomyopathy; Risk Factors:Hypertension,                 Diabetes, Dyslipidemia and Former Smoker.  Sonographer:    Celene Skeen RDCS (AE) Referring Phys: [id number] Deno Lunger [facility]  Sonographer Comments: Suboptimal apical window. Image acquisition challenging due to patient body habitus and Image acquisition challenging due to respiratory motion. very limited mobility IMPRESSIONS  1. Left ventricular ejection fraction, by estimation, is <20%. The left ventricle has severely decreased function. The left ventricle demonstrates global hypokinesis. The left ventricular internal cavity size was moderately dilated. Left ventricular diastolic function could not be evaluated.  2. Right ventricular systolic function is severely reduced. The right  ventricular size is moderately enlarged. Tricuspid regurgitation signal is inadequate for assessing PA pressure.  3. Left atrial size was mild to moderately dilated.  4. Right atrial size was mildly dilated.  5. The mitral valve is degenerative. Trivial mitral valve regurgitation. No evidence of mitral stenosis.  6. The aortic valve is  tricuspid. Aortic valve regurgitation is not visualized. Mild aortic valve sclerosis is present, with no evidence of aortic valve stenosis.  7. The inferior vena cava is dilated in size with <50% respiratory variability, suggesting right atrial pressure of 15 mmHg. Conclusion(s)/Recommendation(s): Probable moderate sized thrombus in LV apex. Recommend definity contrast to further evaulated extent. FINDINGS  Left Ventricle: Left ventricular ejection fraction, by estimation, is <20%. The left ventricle has severely decreased function. The left ventricle demonstrates global hypokinesis. The left ventricular internal cavity size was moderately dilated. There is no left ventricular hypertrophy. Left ventricular diastolic function could not be evaluated. Right Ventricle: The right ventricular size is moderately enlarged. No increase in right ventricular wall thickness. Right ventricular systolic function is severely reduced. Tricuspid regurgitation signal is inadequate for assessing PA pressure. Left Atrium: Left atrial size was mild to moderately dilated. Right Atrium: Right atrial size was mildly dilated. Pericardium: There is no evidence of pericardial effusion. Mitral Valve: The mitral valve is degenerative in appearance. There is mild thickening of the mitral valve leaflet(s). Normal mobility of the mitral valve leaflets. Mild to moderate mitral annular calcification. Trivial mitral valve regurgitation. No evidence of mitral valve stenosis. Tricuspid Valve: The tricuspid valve is normal in structure. Tricuspid valve regurgitation is trivial. No evidence of tricuspid stenosis. Aortic Valve: The aortic valve is tricuspid. Aortic valve regurgitation is not visualized. Mild aortic valve sclerosis is present, with no evidence of aortic valve stenosis. Pulmonic Valve: The pulmonic valve was not well visualized. Pulmonic valve regurgitation is trivial. No evidence of pulmonic stenosis. Aorta: The aortic root is normal in  size and structure. Venous: The inferior vena cava is dilated in size with less than 50% respiratory variability, suggesting right atrial pressure of 15 mmHg. IAS/Shunts: No atrial level shunt detected by color flow Doppler.  LEFT VENTRICLE PLAX 2D LVIDd:         6.10 cm LVIDs:         5.60 cm LV PW:         1.20 cm LV IVS:        0.90 cm LVOT diam:     2.30 cm LV SV:         38 LV SV Index:   18 LVOT Area:     4.15 cm  RIGHT VENTRICLE TAPSE (M-mode): 1.1 cm LEFT ATRIUM           Index       RIGHT ATRIUM           Index LA diam:      3.50 cm 1.64 cm/m  RA Area:     19.40 cm LA Vol (A2C): 92.6 ml 43.41 ml/m RA Volume:   58.70 ml  27.52 ml/m LA Vol (A4C): 21.9 ml 10.27 ml/m  AORTIC VALVE LVOT Vmax:   62.70 cm/s LVOT Vmean:  46.100 cm/s LVOT VTI:    0.090 m  AORTA Ao Root diam: 3.60 cm  SHUNTS Systemic VTI:  0.09 m Systemic Diam: 2.30 cm Armanda Magic MD Electronically signed by Armanda Magic MD Signature Date/Time: 08/12/2019/2:29:09 PM    Final    [place] EKG SITE RITE  Result Date: 08/13/2019 If Site Rite  image not attached, placement could not be confirmed due to current cardiac rhythm.  US Abdomen Limited RUQ  Result Date: 08/12/2019 CLINICAL DATA:  Abnormal LFTs EXAM: ULTRASOUND ABDOMEN LIMITED RIGHT UPPER QUADRANT COMPARISON:  None. FINDINGS: Gallbladder: The gallbladder appears to be filled with slightly a hyperechoic sludge. No sonographic Murphy sign. The gallbladder wall thickness measures 3 mm. No pericholecystic fluid. Common bile duct: Diameter: 5 mm Liver: No focal lesion identified. Within normal limits in parenchymal echogenicity. Portal vein is patent on color Doppler imaging with normal direction of blood flow towards the liver. Other: None. IMPRESSION: Gallbladder sludge.  No definite evidence of cholecystitis. Electronically Signed   By: Jonna Clark M.D.   On: 08/12/2019 01:58

## 2019-08-14 NOTE — Progress Notes (Signed)
Pharmacist Heart Failure Core Measure Documentation  Assessment: Kevin Long has an EF documented as <20% on 08/12/19 by Kevin Long, T.  Rationale: Heart failure patients with left ventricular systolic dysfunction (LVSD) and an EF < 40% should be prescribed an angiotensin converting enzyme inhibitor (ACEI) or angiotensin receptor blocker (ARB) at discharge unless a contraindication is documented in the medical record.  This patient is not currently on an ACEI or ARB for HF.  This note is being placed in the record in order to provide documentation that a contraindication to the use of these agents is present for this encounter.  ACE Inhibitor or Angiotensin Receptor Blocker is contraindicated (specify all that apply)  []   ACEI allergy AND ARB allergy []   Angioedema []   Moderate or severe aortic stenosis []   Hyperkalemia [x]   Hypotension []   Renal artery stenosis [x]   Worsening renal function, preexisting renal disease or dysfunction   Kevin Long 08/14/2019 3:24 PM

## 2019-08-15 ENCOUNTER — Inpatient Hospital Stay (HOSPITAL_COMMUNITY): Payer: Medicaid Other

## 2019-08-15 DIAGNOSIS — U071 COVID-19: Secondary | ICD-10-CM

## 2019-08-15 DIAGNOSIS — I2699 Other pulmonary embolism without acute cor pulmonale: Secondary | ICD-10-CM

## 2019-08-15 DIAGNOSIS — M7989 Other specified soft tissue disorders: Secondary | ICD-10-CM

## 2019-08-15 DIAGNOSIS — I10 Essential (primary) hypertension: Secondary | ICD-10-CM

## 2019-08-15 LAB — COMPREHENSIVE METABOLIC PANEL
ALT: 15 U/L (ref 0–44)
AST: 26 U/L (ref 15–41)
Albumin: 2 g/dL — ABNORMAL LOW (ref 3.5–5.0)
Alkaline Phosphatase: 58 U/L (ref 38–126)
Anion gap: 11 (ref 5–15)
BUN: 31 mg/dL — ABNORMAL HIGH (ref 6–20)
CO2: 26 mmol/L (ref 22–32)
Calcium: 8.2 mg/dL — ABNORMAL LOW (ref 8.9–10.3)
Chloride: 97 mmol/L — ABNORMAL LOW (ref 98–111)
Creatinine, Ser: 1.57 mg/dL — ABNORMAL HIGH (ref 0.61–1.24)
GFR calc Af Amer: 55 mL/min — ABNORMAL LOW (ref >60)
GFR calc non Af Amer: 47 mL/min — ABNORMAL LOW (ref >60)
Glucose, Bld: 345 mg/dL — ABNORMAL HIGH (ref 70–99)
Potassium: 3.8 mmol/L (ref 3.5–5.1)
Sodium: 134 mmol/L — ABNORMAL LOW (ref 135–145)
Total Bilirubin: 0.8 mg/dL (ref 0.3–1.2)
Total Protein: 5.9 g/dL — ABNORMAL LOW (ref 6.5–8.1)

## 2019-08-15 LAB — CBC WITH DIFFERENTIAL/PLATELET
Abs Immature Granulocytes: 0.05 10*3/uL (ref 0.00–0.07)
Basophils Absolute: 0 10*3/uL (ref 0.0–0.1)
Basophils Relative: 0 %
Eosinophils Absolute: 0 10*3/uL (ref 0.0–0.5)
Eosinophils Relative: 0 %
HCT: 42.9 % (ref 39.0–52.0)
Hemoglobin: 13.8 g/dL (ref 13.0–17.0)
Immature Granulocytes: 1 %
Lymphocytes Relative: 9 %
Lymphs Abs: 0.9 10*3/uL (ref 0.7–4.0)
MCH: 26.8 pg (ref 26.0–34.0)
MCHC: 32.2 g/dL (ref 30.0–36.0)
MCV: 83.5 fL (ref 80.0–100.0)
Monocytes Absolute: 0.6 10*3/uL (ref 0.1–1.0)
Monocytes Relative: 6 %
Neutro Abs: 8.4 10*3/uL — ABNORMAL HIGH (ref 1.7–7.7)
Neutrophils Relative %: 84 %
Platelets: 392 10*3/uL (ref 150–400)
RBC: 5.14 MIL/uL (ref 4.22–5.81)
RDW: 15.5 % (ref 11.5–15.5)
WBC: 10 10*3/uL (ref 4.0–10.5)
nRBC: 0 % (ref 0.0–0.2)

## 2019-08-15 LAB — GLUCOSE, CAPILLARY
Glucose-Capillary: 309 mg/dL — ABNORMAL HIGH (ref 70–99)
Glucose-Capillary: 364 mg/dL — ABNORMAL HIGH (ref 70–99)
Glucose-Capillary: 223 mg/dL — ABNORMAL HIGH (ref 70–99)
Glucose-Capillary: 147 mg/dL — ABNORMAL HIGH (ref 70–99)

## 2019-08-15 LAB — FERRITIN: Ferritin: 712 ng/mL — ABNORMAL HIGH (ref 24–336)

## 2019-08-15 LAB — D-DIMER, QUANTITATIVE: D-Dimer, Quant: 3.86 ug/mL-FEU — ABNORMAL HIGH (ref 0.00–0.50)

## 2019-08-15 LAB — C-REACTIVE PROTEIN: CRP: 8.5 mg/dL — ABNORMAL HIGH (ref <1.0)

## 2019-08-15 LAB — URIC ACID: Uric Acid, Serum: 8.6 mg/dL (ref 3.7–8.6)

## 2019-08-15 MED ORDER — MAGNESIUM SULFATE IN D5W 1-5 GM/100ML-% IV SOLN
1.0000 g | Freq: Once | INTRAVENOUS | Status: AC
  Administered 2019-08-15: 1 g via INTRAVENOUS
  Filled 2019-08-15: qty 100

## 2019-08-15 MED ORDER — METHYLPREDNISOLONE SODIUM SUCC 40 MG IJ SOLR
20.0000 mg | Freq: Two times a day (BID) | INTRAMUSCULAR | Status: DC
  Administered 2019-08-15 – 2019-08-16 (×2): 20 mg via INTRAVENOUS
  Filled 2019-08-15 (×2): qty 1

## 2019-08-15 MED ORDER — INSULIN GLARGINE 100 UNIT/ML ~~LOC~~ SOLN
32.0000 [IU] | Freq: Every day | SUBCUTANEOUS | Status: DC
  Administered 2019-08-15 – 2019-08-17 (×3): 32 [IU] via SUBCUTANEOUS
  Filled 2019-08-15 (×4): qty 0.32

## 2019-08-15 MED ORDER — POTASSIUM CHLORIDE CRYS ER 20 MEQ PO TBCR
40.0000 meq | EXTENDED_RELEASE_TABLET | Freq: Once | ORAL | Status: AC
  Administered 2019-08-15: 40 meq via ORAL
  Filled 2019-08-15: qty 2

## 2019-08-15 MED ORDER — TORSEMIDE 20 MG PO TABS
30.0000 mg | ORAL_TABLET | Freq: Every day | ORAL | Status: DC
  Administered 2019-08-15 – 2019-08-17 (×3): 30 mg via ORAL
  Filled 2019-08-15 (×2): qty 2

## 2019-08-15 MED ORDER — ENOXAPARIN SODIUM 120 MG/0.8ML ~~LOC~~ SOLN
1.0000 mg/kg | Freq: Two times a day (BID) | SUBCUTANEOUS | Status: DC
  Administered 2019-08-15 – 2019-08-16 (×2): 110 mg via SUBCUTANEOUS
  Filled 2019-08-15 (×3): qty 0.73

## 2019-08-15 MED ORDER — GABAPENTIN 100 MG PO CAPS
100.0000 mg | ORAL_CAPSULE | Freq: Three times a day (TID) | ORAL | Status: DC
  Administered 2019-08-15 – 2019-08-19 (×12): 100 mg via ORAL
  Filled 2019-08-15 (×13): qty 1

## 2019-08-15 MED ORDER — CARVEDILOL 3.125 MG PO TABS
3.1250 mg | ORAL_TABLET | Freq: Two times a day (BID) | ORAL | Status: DC
  Administered 2019-08-15 – 2019-08-19 (×8): 3.125 mg via ORAL
  Filled 2019-08-15 (×8): qty 1

## 2019-08-15 MED ORDER — INSULIN ASPART 100 UNIT/ML ~~LOC~~ SOLN
6.0000 [IU] | Freq: Three times a day (TID) | SUBCUTANEOUS | Status: DC
  Administered 2019-08-15 – 2019-08-19 (×13): 6 [IU] via SUBCUTANEOUS

## 2019-08-15 NOTE — Progress Notes (Signed)
Bilateral lower extremity venous duplex completed. Refer to "CV Proc" under chart review to view preliminary results.  08/15/2019 3:21 PM Eula Fried., MHA, RVT, RDCS, RDMS

## 2019-08-15 NOTE — Progress Notes (Signed)
Inpatient Diabetes Program Recommendations  AACE/ADA: New Consensus Statement on Inpatient Glycemic Control (2015)  Target Ranges:  Prepandial:   less than 140 mg/dL      Peak postprandial:   less than 180 mg/dL (1-2 hours)      Critically ill patients:  140 - 180 mg/dL   Lab Results  Component Value Date   GLUCAP 364 (H) 08/15/2019   HGBA1C 12.4 (H) 08/12/2019    Review of Glycemic Control Results for Kevin Long, Kevin Long (MRN [id number]) as of 08/15/2019 12:43  Ref. Range 08/14/2019 15:56 08/14/2019 20:58 08/15/2019 07:49 08/15/2019 11:44  Glucose-Capillary Latest Ref Range: 70 - 99 mg/dL 790 (H) 240 (H) 973 (H) 364 (H)   Diabetes history: Type 2 DM Outpatient Diabetes medications: Lantus 70 units QHS, Novolog 15 units TID Current orders for Inpatient glycemic control: Lantus 32 units QHS, Novolog 6 units TID, Novolog 0-20 units TID, Novolog 0-5 units QHS Solumedrol 40 mg Q12H Decadron 10 mg x 1 on 5/16  Inpatient Diabetes Program Recommendations:     Spoke with patient regarding outpatient diabetes management. Patient verifies home dosages above and denies missing doses.  Reviewed patient's current A1c of 12.4%. Explained what a A1c is and what it measures. Also reviewed goal A1c with patient, importance of good glucose control @ home, and blood sugar goals. Reviewed patho of DM, need for insulin, role of pancreas, impact of glucose trends with infection, vascular changes and commorbidities.  Patient needs a glucose meter at discharge. Blood glucose meter (#[id number]). Patient denies recently checking blood sugars. Encouraged to begin checking more frequently, atleast 2-3 times per day. Patient usually gets supplies from [place], however, ran out. [facility] and arrange PCP appointment once permitted with negative results.  Admits to drinking juices. Reviewed alternatives to sugary beverages and discussed importance of being mindful of carb intake. Patient  interested in outpatient endocrinology. Discussed role and attached information to DC summary. Patient has no further questions at this time.   Thanks, Lujean Rave, MSN, RNC-OB Diabetes Coordinator [phone] (8a-5p)

## 2019-08-15 NOTE — Progress Notes (Signed)
Pharmacy Antibiotic Note  Kevin Long is a 59 y.o. male admitted on 08/11/2019 with pneumonia.  Pharmacy consulted 5/14 to change abx to Cefazolin for MSSA bacteremia.  4/29>> COVID-19 positive at [facility] (results available in care everywhere).  Remdesivir 5/14>>5/18.  PCT 0.73>6.16>5.06. CRP 29.9>>8.5. WBC 13.2 down>10k.  Afebrile. SCr 1.57, estimated CrCl 58 ml/min    Remdesivir 5/14>5/18 Steroids: 5/13>> Unasyn 5/14 Azithro 5/13>5/15 Rocephin 5/13 x 1 Ancef 5/14 >>  Plan: Continue Cefazolin 2 g IV q8 hours Monitor clinical status, renal function and culture results daily.    Height: 5\' 6"  (167.6 cm) Weight: 109.3 kg (240 lb 15.4 oz) IBW/kg (Calculated) : 63.8  Temp (24hrs), Avg:98.1 F (36.7 C), Min:97.7 F (36.5 C), Max:98.6 F (37 C)  Recent Labs  Lab 08/11/19 1614 08/11/19 1855 08/12/19 0106 08/12/19 0826 08/12/19 0922 08/13/19 1032 08/14/19 0641 08/15/19 0422  WBC 19.9*  --   --   --  14.7* 12.8* 13.2* 10.0  CREATININE 1.65*  --   --   --  1.53* 1.83* 1.36* 1.57*  LATICACIDVEN 4.1* 4.4* 7.9* 3.8*  --   --   --   --     Estimated Creatinine Clearance: 58 mL/min (A) (by C-G formula based on SCr of 1.57 mg/dL (H)).    Allergies  Allergen Reactions  . Celebrex [Celecoxib] Swelling    Swelling in face  . Entresto [Sacubitril-Valsartan] Other (See Comments)    Dizziness     Thank you for allowing pharmacy to be a part of this patient's care.  08/17/19, RPh Clinical Pharmacist [phone] Please check AMION for all [facility] phone numbers After 10:00 PM, call Main Pharmacy [phone] 08/15/2019 10:15 AM

## 2019-08-15 NOTE — Progress Notes (Signed)
PROGRESS NOTE                                                                                                                                                                                                             Patient Demographics:    Kevin Long, is a 59 y.o. male, DOB - 1960-11-15, ZOX:[id number]  Outpatient Primary MD for the patient is Fleet Contras, MD   Admit date - 08/11/2019   LOS - 3  Chief Complaint  Patient presents with  . Sepsis alert, Covid pos       Brief Narrative: Patient is a 59 y.o. male with PMHx of chronic systolic heart failure, HTN, DM-2-, OSA noncompliant to CPAP who presented with shortness of breath-found to have acute hypoxic respiratory failure secondary to PE, COVID-19 pneumonia, lung mass, decompensated heart failure and possible bacterial pneumonia.  Post admission-blood cultures positive for MSSA.  See below for further details.  Significant Events: 4/29>> COVID-19 positive at [facility] (results available in care everywhere) 5/13>> admit to [facility] for hypoxia-CT chest with lung mass, postobstructive pneumonia, groundglass opacities and PE. 5/17>> transitioned to room air  Significant studies 5/13>> CT chest: 7.7x8.2 masslike consolidation in the right upper lobe with surrounding groundglass/tree-in-bud nodularity reflecting postobstructive pneumonia, additional areas of multifocal/groundglass pneumonia, segmental/subsegmental PE in the right upper lobe.  5/14>> TTE: EF<20%, RV systolic function severely reduced  COVID-19 medications: Steroids: 5/13>> Remdesivir: 5/13>>  Antibiotics: Ancef: 5/14>> Unasyn: 5/14>>5/14 Zithromax: 5/13>>5/15 Ceftriaxone: 5/13 x 1  Microbiology data: 5/16: Blood culture>> no growth 5/13: Blood culture>> 1/2 MSSA  5/13: Urine culture>> no growth   DVT prophylaxis: Therapeutic Lovenox  Procedures: None  Consults: None   Subjective:    Transition to room air this morning-he is significantly improved-denies any chest pain or shortness of breath.   Assessment  & Plan :   Acute Hypoxic Resp Failure due to Covid 19 Viral pneumonia, postobstructive pneumonia and pulmonary embolism: Much improved-transition to room air this morning.  Remains on steroids/remdesivir/Ancef in full dose anticoagulation.     He is not a candidate for Actemra given concurrent bacterial infection.  Fever: afebrile Incentive Spirometry: encouraged incentive spirometry use 3-4/hour. O2 requirements:  SpO2: 96 % O2 Flow Rate (L/min): 2 L/min   COVID-19 Labs: Recent Labs    08/13/19 1032 08/14/19 0641 08/15/19 0422  DDIMER 3.62* 5.09*  3.86*  FERRITIN 1,224* 1,060* 712*  CRP 28.7* 18.6* 8.5*       Component Value Date/Time   BNP 701.0 (H) 08/13/2019 1032    Recent Labs  Lab 08/11/19 1614 08/12/19 0922 08/13/19 1032 08/14/19 0641  PROCALCITON 0.73 6.16 5.22 5.06    No results found for: SARSCOV2NAA   MSSA bacteremia: Remains on Ancef-repeat blood culture on 5/16 negative so far.  TTE on 5/14-no obvious vegetation.  Spoke with Dr. Macarthur Critchley will arrange for TEE over the next few days.   Acute on chronic systolic heart failure (EF 25-30% by TTE on 11/19/2016): Volume status much better-now on room air.  Continues to have mild AKI-decrease Demadex to 30 mg-resume low-dose Coreg today.  Per Dr. Macarthur Critchley has been noncompliant to medications and follow-up-and unfortunately he is not a candidate for device placement due to noncompliance.   Acute metabolic encephalopathy: Secondary to hypoxemia/bacteremia.  Encephalopathy has improved with treatment of the underlying etiologies.  Suspect he is not very far from his baseline.  AKI: Likely hemodynamically mediated in the setting of COVID-19-UA negative for proteinuria-improving with supportive care.  Watch closely while he is on diuretics.   Lung mass: Currently situation is very tenuous-with  respiratory failure-we will treat underlying pneumonia/PE/CHF-if he stabilizes-at some point he will probably need either repeat imaging or bronchoscopy with biopsy.  He is a ex-smoker-quit 2009.  Elevated troponin: Probably demand ischemia in the setting of PE/hypoxemia-recheck troponin levels later this afternoon.  EKG nonacute.  Outpatient cardiology note-last LHC in 2014 revealed normal coronary arteries.  HTN: Controlled-resuming low-dose beta-blocker today.  Bilateral foot pain: Ongoing for the past 1-2 months-apparently per patient-he stopped ambulating due to pain.  Pain is mostly in his bilateral toes and forefoot area-no obvious local swelling/erythema evident.  Uric acid levels on the higher side-claims that some response to IV Solu-Medrol.  Plain x-rays-without acute abnormalities-suspect he could have developed neuropathy-we will try low-dose Neurontin and see how he does.  Insulin-dependent DM-2 with uncontrolled hyperglycemia due to poor outpatient control worsened by steroids (A1c 12.4 on 08/12/2019): Worsening hyperglycemia due to steroids-had hypoglycemic episode earlier this hospital stay-have increased Lantus to 32 units, have added 6 units of NovoLog with meals-continue SSI.  Follow and adjust.   Recent Labs    08/14/19 2058 08/15/19 0749 08/15/19 1144  GLUCAP 331* 309* 364*   OSA: Claims that his machine broke several months ago-we will try to place him on CPAP nightly if he tolerates here in the hospital.  Morbid Obesity: Estimated body mass index is 38.89 kg/m as calculated from the following:   Height as of this encounter: 5\' 6"  (1.676 m).   Weight as of this encounter: 109.3 kg.    ABG:    Component Value Date/Time   PHART 7.484 (H) 08/12/2019 1023   PCO2ART 25.6 (L) 08/12/2019 1023   PO2ART 83.8 08/12/2019 1023   HCO3 19.1 (L) 08/12/2019 1023   TCO2 16 (L) 08/11/2019 2349   ACIDBASEDEF 3.9 (H) 08/12/2019 1023   O2SAT 97.6 08/12/2019 1023    Vent  Settings: N/A   Condition - Stable  Family Communication  :  significant other updated over the phone [phone]) on 5/17  Code Status : Clinically improved-we will change to full code at the request of patient and family.    Diet :  Diet Order            Diet heart healthy/carb modified Room service appropriate? Yes; Fluid consistency: Thin; Fluid restriction: 1500 mL Fluid  Diet effective now               Disposition Plan  :   Status is: Inpatient  Remains inpatient appropriate because:Inpatient level of care appropriate due to severity of illness  Dispo: The patient is from: Home              Anticipated d/c is to: Home              Anticipated d/c date is: > 3 days              Patient currently is not medically stable to d/c.   Barriers to discharge:IV antimicrobial therapy-AKI-MSSA bacteremia  Antimicorbials  :    Anti-infectives (From admission, onward)   Start     Dose/Rate Route Frequency Ordered Stop   08/13/19 1000  remdesivir 100 mg in sodium chloride 0.9 % 100 mL IVPB     100 mg 200 mL/hr over 30 Minutes Intravenous Daily 08/11/19 2347 08/17/19 0959   08/13/19 0600  ceFAZolin (ANCEF) IVPB 2g/100 mL premix     2 g 200 mL/hr over 30 Minutes Intravenous Every 8 hours 08/12/19 2248     08/12/19 2200  ceFAZolin (ANCEF) IVPB 2g/100 mL premix     2 g 200 mL/hr over 30 Minutes Intravenous Every 8 hours 08/12/19 1840 08/12/19 2218   08/12/19 1800  azithromycin (ZITHROMAX) 500 mg in sodium chloride 0.9 % 250 mL IVPB  Status:  Discontinued     500 mg 250 mL/hr over 60 Minutes Intravenous Every 24 hours 08/12/19 0709 08/13/19 1223   08/12/19 0800  Ampicillin-Sulbactam (UNASYN) 3 g in sodium chloride 0.9 % 100 mL IVPB  Status:  Discontinued     3 g 200 mL/hr over 30 Minutes Intravenous Every 6 hours 08/12/19 0730 08/12/19 1840   08/12/19 0030  remdesivir 200 mg in sodium chloride 0.9% 250 mL IVPB     200 mg 580 mL/hr over 30 Minutes Intravenous Once 08/11/19  2347 08/12/19 0219   08/11/19 1700  cefTRIAXone (ROCEPHIN) 1 g in sodium chloride 0.9 % 100 mL IVPB     1 g 200 mL/hr over 30 Minutes Intravenous  Once 08/11/19 1649 08/11/19 1756   08/11/19 1700  azithromycin (ZITHROMAX) 500 mg in sodium chloride 0.9 % 250 mL IVPB     500 mg 250 mL/hr over 60 Minutes Intravenous  Once 08/11/19 1649 08/11/19 2054      Inpatient Medications  Scheduled Meds: . carvedilol  3.125 mg Oral BID WC  . enoxaparin (LOVENOX) injection  1 mg/kg Subcutaneous Q12H  . insulin aspart  0-20 Units Subcutaneous TID WC  . insulin aspart  0-5 Units Subcutaneous QHS  . insulin aspart  6 Units Subcutaneous TID WC  . insulin glargine  32 Units Subcutaneous QHS  . methylPREDNISolone (SOLU-MEDROL) injection  40 mg Intravenous Q12H  . pneumococcal 23 valent vaccine  0.5 mL Intramuscular Tomorrow-1000  . potassium chloride  40 mEq Oral Once  . torsemide  30 mg Oral Daily   Continuous Infusions: .  ceFAZolin (ANCEF) IV 2 g (08/15/19 0917)  . magnesium sulfate bolus IVPB    . remdesivir 100 mg in NS 100 mL 100 mg (08/15/19 1105)   PRN Meds:.acetaminophen **OR** acetaminophen, albuterol, chlorpheniramine-HYDROcodone, guaiFENesin-dextromethorphan, ondansetron **OR** ondansetron (ZOFRAN) IV, polyethylene glycol   Time Spent in minutes 25   See all Orders from today for further details   Jeoffrey Massed M.D on 08/15/2019 at 12:00 PM  To page go to [email or web address] -  use universal password  [facility] -  Office  [phone]    Objective:   Vitals:   08/15/19 0413 08/15/19 0500 08/15/19 0844 08/15/19 0920  BP: 104/61  101/65   Pulse: 82  92 92  Resp: 17  19   Temp: 98.1 F (36.7 C)  97.7 F (36.5 C)   TempSrc: Oral  Oral   SpO2: 100%  100% 96%  Weight:  109.3 kg    Height:        Wt Readings from Last 3 Encounters:  08/15/19 109.3 kg  06/22/19 118.7 kg  12/02/16 124.4 kg     Intake/Output Summary (Last 24 hours) at 08/15/2019 1200 Last data filed  at 08/15/2019 0900 Gross per 24 hour  Intake 720 ml  Output 2250 ml  Net -1530 ml     Physical Exam Gen Exam:Alert awake-not in any distress HEENT:atraumatic, normocephalic Chest: B/L clear to auscultation anteriorly CVS:S1S2 regular Abdomen:soft non tender, non distended Extremities:trace edema Neurology: Non focal Skin: no rash   Data Review:    CBC Recent Labs  Lab 08/11/19 1614 08/11/19 1614 08/11/19 2349 08/12/19 0922 08/13/19 1032 08/14/19 0641 08/15/19 0422  WBC 19.9*  --   --  14.7* 12.8* 13.2* 10.0  HGB 15.8   < > 15.6 15.3 14.8 14.4 13.8  HCT 51.4   < > 46.0 48.4 48.7 45.7 42.9  PLT 345  --   --  297 375 400 392  MCV 87.0  --   --  85.1 87.9 84.9 83.5  MCH 26.7  --   --  26.9 26.7 26.8 26.8  MCHC 30.7  --   --  31.6 30.4 31.5 32.2  RDW 16.4*  --   --  16.3* 16.3* 15.9* 15.5  LYMPHSABS 0.6*  --   --  1.2 0.8 1.0 0.9  MONOABS 1.2*  --   --  0.6 0.7 0.7 0.6  EOSABS 0.0  --   --  0.0 0.0 0.0 0.0  BASOSABS 0.1  --   --  0.0 0.0 0.0 0.0   < > = values in this interval not displayed.    Chemistries  Recent Labs  Lab 08/11/19 1614 08/11/19 1614 08/11/19 2349 08/12/19 0922 08/13/19 1032 08/14/19 0641 08/15/19 0422  NA 139   < > 134* 140 142 137 134*  K 5.0   < > 4.6 4.5 5.1 5.0 3.8  CL 100  --   --  107 107 99 97*  CO2 18*  --   --  19* 17* 25 26  GLUCOSE 392*  --   --  157* 221* 87 345*  BUN 11  --   --  14 30* 25* 31*  CREATININE 1.65*  --   --  1.53* 1.83* 1.36* 1.57*  CALCIUM 8.9  --   --  8.8* 8.6* 8.5* 8.2*  MG  --   --   --  1.9 2.2 2.1  --   AST 24  --   --  24 81* 43* 26  ALT 23  --   --  ALKPHOS 68  --   --  63 75 93 58  BILITOT 3.3*  --   --  1.6* 1.4* 1.6* 0.8   < > = values in this interval not displayed.   ------------------------------------------------------------------------------------------------------------------ No results for input(s): CHOL, HDL, LDLCALC, TRIG, CHOLHDL, LDLDIRECT in the last 72 hours.  Lab  Results  Component Value Date   HGBA1C 12.4 (  H) 08/12/2019   ------------------------------------------------------------------------------------------------------------------ No results for input(s): TSH, T4TOTAL, T3FREE, THYROIDAB in the last 72 hours.  Invalid input(s): FREET3 ------------------------------------------------------------------------------------------------------------------ Recent Labs    08/14/19 0641 08/15/19 0422  FERRITIN 1,060* 712*    Coagulation profile Recent Labs  Lab 08/11/19 1614 08/12/19 0826  INR 1.7* 2.2*    Recent Labs    08/14/19 0641 08/15/19 0422  DDIMER 5.09* 3.86*    Cardiac Enzymes No results for input(s): CKMB, TROPONINI, MYOGLOBIN in the last 168 hours.  Invalid input(s): CK ------------------------------------------------------------------------------------------------------------------    Component Value Date/Time   BNP 701.0 (H) 08/13/2019 1032    Micro Results Recent Results (from the past 240 hour(s))  Blood Culture (routine x 2)     Status: Abnormal   Collection Time: 08/11/19  3:58 PM   Specimen: BLOOD RIGHT ARM  Result Value Ref Range Status   Specimen Description BLOOD RIGHT ARM  Final   Special Requests   Final    BOTTLES DRAWN AEROBIC AND ANAEROBIC Blood Culture adequate volume   Culture  Setup Time   Final    GRAM POSITIVE COCCI IN CLUSTERS ANAEROBIC BOTTLE ONLY CRITICAL RESULT CALLED TO, READ BACK BY AND VERIFIED WITHIhor Austin PHARMD 1826 08/12/19 A BROWNING Performed at [facility], [address], [place], [place] [address]    Culture STAPHYLOCOCCUS AUREUS (A)  Final   Report Status 08/14/2019 FINAL  Final   Organism ID, Bacteria STAPHYLOCOCCUS AUREUS  Final      Susceptibility   Staphylococcus aureus - MIC*    CIPROFLOXACIN <=0.5 SENSITIVE Sensitive     ERYTHROMYCIN >=8 RESISTANT Resistant     GENTAMICIN <=0.5 SENSITIVE Sensitive     OXACILLIN 0.5 SENSITIVE Sensitive     TETRACYCLINE <=1  SENSITIVE Sensitive     VANCOMYCIN 1 SENSITIVE Sensitive     TRIMETH/SULFA <=10 SENSITIVE Sensitive     CLINDAMYCIN <=0.25 SENSITIVE Sensitive     RIFAMPIN <=0.5 SENSITIVE Sensitive     Inducible Clindamycin NEGATIVE Sensitive     * STAPHYLOCOCCUS AUREUS  Blood Culture ID Panel (Reflexed)     Status: Abnormal   Collection Time: 08/11/19  3:58 PM  Result Value Ref Range Status   Enterococcus species NOT DETECTED NOT DETECTED Final   Listeria monocytogenes NOT DETECTED NOT DETECTED Final   Staphylococcus species DETECTED (A) NOT DETECTED Final    Comment: CRITICAL RESULT CALLED TO, READ BACK BY AND VERIFIED WITHIhor Austin PHARMD 1826 08/12/19 A BROWNING    Staphylococcus aureus (BCID) DETECTED (A) NOT DETECTED Final    Comment: Methicillin (oxacillin) susceptible Staphylococcus aureus (MSSA). Preferred therapy is anti staphylococcal beta lactam antibiotic (Cefazolin or Nafcillin), unless clinically contraindicated. CRITICAL RESULT CALLED TO, READ BACK BY AND VERIFIED WITH: Ihor Austin PHARMD 1826 08/12/19 A BROWNING    Methicillin resistance NOT DETECTED NOT DETECTED Final   Streptococcus species NOT DETECTED NOT DETECTED Final   Streptococcus agalactiae NOT DETECTED NOT DETECTED Final   Streptococcus pneumoniae NOT DETECTED NOT DETECTED Final   Streptococcus pyogenes NOT DETECTED NOT DETECTED Final   Acinetobacter baumannii NOT DETECTED NOT DETECTED Final   Enterobacteriaceae species NOT DETECTED NOT DETECTED Final   Enterobacter cloacae complex NOT DETECTED NOT DETECTED Final   Escherichia coli NOT DETECTED NOT DETECTED Final   Klebsiella oxytoca NOT DETECTED NOT DETECTED Final   Klebsiella pneumoniae NOT DETECTED NOT DETECTED Final   Proteus species NOT DETECTED NOT DETECTED Final   Serratia marcescens NOT DETECTED NOT DETECTED Final   Haemophilus influenzae  NOT DETECTED NOT DETECTED Final   Neisseria meningitidis NOT DETECTED NOT DETECTED Final   Pseudomonas aeruginosa NOT DETECTED  NOT DETECTED Final   Candida albicans NOT DETECTED NOT DETECTED Final   Candida glabrata NOT DETECTED NOT DETECTED Final   Candida krusei NOT DETECTED NOT DETECTED Final   Candida parapsilosis NOT DETECTED NOT DETECTED Final   Candida tropicalis NOT DETECTED NOT DETECTED Final    Comment: Performed at [facility], [address] [place], [place] [address]  Urine culture     Status: None   Collection Time: 08/11/19  9:06 PM   Specimen: In/Out Cath Urine  Result Value Ref Range Status   Specimen Description IN/OUT CATH URINE  Final   Special Requests NONE  Final   Culture   Final    NO GROWTH Performed at [facility], [address], [place], [place] [address]    Report Status 08/14/2019 FINAL  Final  Culture, blood (routine x 2)     Status: None (Preliminary result)   Collection Time: 08/14/19  6:41 AM   Specimen: BLOOD  Result Value Ref Range Status   Specimen Description BLOOD RIGHT ANTECUBITAL  Final   Special Requests   Final    BOTTLES DRAWN AEROBIC AND ANAEROBIC Blood Culture results may not be optimal due to an inadequate volume of blood received in culture bottles   Culture   Final    NO GROWTH 1 DAY Performed at [facility], [address] [place], [place] [address]    Report Status PENDING  Incomplete  Culture, blood (routine x 2)     Status: None (Preliminary result)   Collection Time: 08/14/19  6:41 AM   Specimen: BLOOD RIGHT HAND  Result Value Ref Range Status   Specimen Description BLOOD RIGHT HAND  Final   Special Requests   Final    BOTTLES DRAWN AEROBIC AND ANAEROBIC Blood Culture results may not be optimal due to an inadequate volume of blood received in culture bottles   Culture   Final    NO GROWTH 1 DAY Performed at [facility], [address], [place], [place] [address]    Report Status PENDING  Incomplete    Radiology Reports CT Angio Chest PE W/Cm &/Or Wo Cm  Result Date: 08/11/2019 CLINICAL DATA:  Shortness of  breath, hypoxia, hemoptysis, recent COVID-19 diagnosis 6 days prior EXAM: CT ANGIOGRAPHY CHEST WITH CONTRAST TECHNIQUE: Multidetector CT imaging of the chest was performed using the standard protocol during bolus administration of intravenous contrast. Multiplanar CT image reconstructions and MIPs were obtained to evaluate the vascular anatomy. CONTRAST:  OMNIPAQUE IOHEXOL 350 MG/ML SOLN COMPARISON:  Same-day radiograph FINDINGS: Cardiovascular: Satisfactory opacification the pulmonary arteries to the segmental level. Evaluation beyond the lobar level is limited due to extensive respiratory motion artifact. However, there is suspected filling defect within the segmental and subsegmental pulmonary arteries of the posterior right lower lobe extending to the ostium of the right upper lobar pulmonary artery. Finding is best seen on coronal MIP imaging series 14, image [id number]. Several of the pulmonary arteries in the region of the superior segment right lower lobe and right middle lobe appear narrowed as the past within the perihilar density detailed below. Central pulmonary arteries are normal caliber. Slight reflux of contrast into the IVC and hepatic veins. Without other convincing CT evidence of right heart strain. Borderline cardiomegaly without pericardial effusion. Atherosclerotic plaque within the normal caliber aorta. Shared origin of  the left common carotid and brachiocephalic artery. Minimal plaque in the proximal great vessels. Mediastinum/Nodes: Multiple subcentimeter mediastinal and hilar nodes are present. More confluent soft tissue opacity extending within the subcarinal space in into the right hilum (9/44-48). Mild focal narrowing of the right mainstem bronchus and bronchus intermedius in this region of soft tissue attenuation. Possibly reflecting conglomerate nodal tissue versus direct extension of a mass. Tiny diverticulum seen arising from the inferomedial aspect of the left mainstem bronchus. No  other acute abnormality of the central airways. Difficult to discern the margins of the distal thoracic esophagus from the soft tissue in the subcarinal region. Thyroid gland and thoracic inlet are unremarkable. Lungs/Pleura: Dense masslike consolidation measuring approximately 7.7 x 8.2 cm (11/35) is seen in the posterior segment right upper lobe abutting the major fissure with confluent soft tissue attenuation extending into the right hilum and subcarinal region which is highly worrisome for an underlying mass lesion particularly given narrowing of the airways and pulmonary arteries in this location. However, there are additional peripheral tree-in-bud opacities and multifocal mixed ground-glass and consolidation elsewhere throughout the lungs. No visible pneumothorax or effusion. Background of centrilobular emphysema is noted towards the apices. Upper Abdomen: No acute abnormalities present in the visualized portions of the upper abdomen. Musculoskeletal: No acute osseous abnormality or suspicious osseous lesion. Multilevel degenerative changes are present in the imaged portions of the spine. Additional degenerative changes at the left sternoclavicular joint and bilateral shoulders. Review of the MIP images confirms the above findings. IMPRESSION: 1. Segmental and subsegmental pulmonary artery emboli in the pulmonary arteries of the posterior segment right upper lobe extending towards the ostial origin from the left upper lobar pulmonary artery. No CT evidence of right heart strain. 2. Dense masslike consolidation measuring approximately 7.7 x 8.2 cm in the posterior segment right upper lobe abutting the major fissure with confluent soft tissue attenuation extending into the right hilum and subcarinal region which is highly worrisome for an underlying mass lesion particularly given narrowing of the airways and pulmonary arteries in this location. While dense infectious consolidation or large pulmonary infarct  could have a similar appearance high suspicion should be maintained with continued follow-up imaging following acute intervention. Surrounding areas of ground-glass and tree-in-bud nodularity could reflect some postobstructive pneumonia given truncation the airways entering this region. 3. Additional multifocal areas of mixed consolidation and ground-glass opacity compatible with atypical pneumonia in the setting of known COVID-19 positivity. 4. Additional subcentimeter mediastinal and hilar nodes, likely reactive though metastatic disease is not excluded. 5. Aortic Atherosclerosis (ICD10-I70.0). These results were called by telephone at the time of interpretation on 08/11/2019 at 10:48 pm to provider GARRETT GREEN , who verbally acknowledged these results. Electronically Signed   By: Kreg Shropshire M.D.   On: 08/11/2019 22:49   DG Chest Port 1 View  Result Date: 08/11/2019 CLINICAL DATA:  Fall shortness of breath, hypoxia, COVID-19 positive EXAM: PORTABLE CHEST 1 VIEW COMPARISON:  Radiograph 10/24/2013 FINDINGS: Focal opacity is seen in the right mid lung, likely in the upper lobe. More mixed patchy and interstitial opacities are seen in the lung bases and periphery. No pneumothorax or visible effusion. Mild cardiomegaly though possibly accentuated by portable technique. Remaining cardiomediastinal contours are unremarkable. No acute osseous or soft tissue abnormality. Telemetry leads overlie the chest. IMPRESSION: 1. Focal opacity in the right mid lung, likely in the upper lobe, suspicious for pneumonia. 2. Additional patchy and interstitial opacities in the lung bases and periphery may reflect additional areas of  infection in the setting of COVID-19. Electronically Signed   By: Lovena Le M.D.   On: 08/11/2019 15:38   DG Foot Complete Left  Result Date: 08/14/2019 CLINICAL DATA:  Left foot pain, bacteremia EXAM: LEFT FOOT - COMPLETE 3+ VIEW COMPARISON:  None. FINDINGS: No acute fracture. No dislocation.  Mild first MTP joint space narrowing. Well defined marginal lucencies with sclerotic margins at the medial aspect of the left first metatarsal head, which can be seen in the setting of a crystalline arthropathy such as gout. Small plantar calcaneal spur. There is soft tissue swelling over the dorsum of the forefoot. IMPRESSION: 1. Soft tissue swelling over the dorsum of the forefoot without underlying fracture or dislocation. 2. Small well defined marginal lucencies at the medial aspect of the left first metatarsal head, which may reflect sequela of gout. Electronically Signed   By: Davina Poke D.O.   On: 08/14/2019 17:10   DG Foot Complete Right  Result Date: 08/14/2019 CLINICAL DATA:  Bilateral foot pain for 2 months. Bacteremia EXAM: RIGHT FOOT COMPLETE - 3+ VIEW COMPARISON:  04/20/2008 FINDINGS: Postsurgical changes related to ORIF of the first through fourth tarsometatarsal joints. Hardware appears intact without perihardware lucency or fracture. 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 No acute osseous abnormality. No erosion or cortical destruction. Soft tissues are within normal limits. IMPRESSION: 1. No acute osseous abnormality. 2. Postsurgical changes related to ORIF of the first through fourth tarsometatarsal joints. No evidence for hardware complication. Electronically Signed   By: Davina Poke D.O.   On: 08/14/2019 17:08   ECHOCARDIOGRAM COMPLETE  Result Date: 08/12/2019    ECHOCARDIOGRAM REPORT   Patient Name:   NIKOLAOS MADDOCKS Date of Exam: 08/12/2019 Medical Rec #:  [id number]     Height:       66.0 in Accession #:    [id number]    Weight:       232.8 lb Date of Birth:  12-24-1960     BSA:          2.133 m Patient Age:    65 years      BP:           90/75 mmHg Patient Gender: M             HR:           98 bpm. Exam Location:  Inpatient Procedure: 2D Echo Indications:    415.19 pulmonary embolus  History:        Patient has prior history of Echocardiogram examinations, most                  recent 06/26/2019. Cardiomyopathy; Risk Factors:Hypertension,                 Diabetes, Dyslipidemia and Former Smoker.  Sonographer:    Jannett Celestine RDCS (AE) Referring Phys: [id number] Sherryll Burger [facility]  Sonographer Comments: Suboptimal apical window. Image acquisition challenging due to patient body habitus and Image acquisition challenging due to respiratory motion. very limited mobility IMPRESSIONS  1. Left ventricular ejection fraction, by estimation, is <20%. The left ventricle has severely decreased function. The left ventricle demonstrates global hypokinesis. The left ventricular internal cavity size was moderately dilated. Left ventricular diastolic function could not be evaluated.  2. Right ventricular systolic function is severely reduced. The right ventricular size is moderately enlarged. Tricuspid regurgitation signal is inadequate for assessing PA pressure.  3. Left atrial size was mild to moderately dilated.  4. Right atrial size was  mildly dilated.  5. The mitral valve is degenerative. Trivial mitral valve regurgitation. No evidence of mitral stenosis.  6. The aortic valve is tricuspid. Aortic valve regurgitation is not visualized. Mild aortic valve sclerosis is present, with no evidence of aortic valve stenosis.  7. The inferior vena cava is dilated in size with <50% respiratory variability, suggesting right atrial pressure of 15 mmHg. Conclusion(s)/Recommendation(s): Probable moderate sized thrombus in LV apex. Recommend definity contrast to further evaulated extent. FINDINGS  Left Ventricle: Left ventricular ejection fraction, by estimation, is <20%. The left ventricle has severely decreased function. The left ventricle demonstrates global hypokinesis. The left ventricular internal cavity size was moderately dilated. There is no left ventricular hypertrophy. Left ventricular diastolic function could not be evaluated. Right Ventricle: The right ventricular size is moderately enlarged. No  increase in right ventricular wall thickness. Right ventricular systolic function is severely reduced. Tricuspid regurgitation signal is inadequate for assessing PA pressure. Left Atrium: Left atrial size was mild to moderately dilated. Right Atrium: Right atrial size was mildly dilated. Pericardium: There is no evidence of pericardial effusion. Mitral Valve: The mitral valve is degenerative in appearance. There is mild thickening of the mitral valve leaflet(s). Normal mobility of the mitral valve leaflets. Mild to moderate mitral annular calcification. Trivial mitral valve regurgitation. No evidence of mitral valve stenosis. Tricuspid Valve: The tricuspid valve is normal in structure. Tricuspid valve regurgitation is trivial. No evidence of tricuspid stenosis. Aortic Valve: The aortic valve is tricuspid. Aortic valve regurgitation is not visualized. Mild aortic valve sclerosis is present, with no evidence of aortic valve stenosis. Pulmonic Valve: The pulmonic valve was not well visualized. Pulmonic valve regurgitation is trivial. No evidence of pulmonic stenosis. Aorta: The aortic root is normal in size and structure. Venous: The inferior vena cava is dilated in size with less than 50% respiratory variability, suggesting right atrial pressure of 15 mmHg. IAS/Shunts: No atrial level shunt detected by color flow Doppler.  LEFT VENTRICLE PLAX 2D LVIDd:         6.10 cm LVIDs:         5.60 cm LV PW:         1.20 cm LV IVS:        0.90 cm LVOT diam:     2.30 cm LV SV:         38 LV SV Index:   18 LVOT Area:     4.15 cm  RIGHT VENTRICLE TAPSE (M-mode): 1.1 cm LEFT ATRIUM           Index       RIGHT ATRIUM           Index LA diam:      3.50 cm 1.64 cm/m  RA Area:     19.40 cm LA Vol (A2C): 92.6 ml 43.41 ml/m RA Volume:   58.70 ml  27.52 ml/m LA Vol (A4C): 21.9 ml 10.27 ml/m  AORTIC VALVE LVOT Vmax:   62.70 cm/s LVOT Vmean:  46.100 cm/s LVOT VTI:    0.090 m  AORTA Ao Root diam: 3.60 cm  SHUNTS Systemic VTI:  0.09 m  Systemic Diam: 2.30 cm Armanda Magic MD Electronically signed by Armanda Magic MD Signature Date/Time: 08/12/2019/2:29:09 PM    Final    [place] EKG SITE RITE  Result Date: 08/13/2019 If Site Rite image not attached, placement could not be confirmed due to current cardiac rhythm.  US Abdomen Limited RUQ  Result Date: 08/12/2019 CLINICAL DATA:  Abnormal LFTs EXAM: ULTRASOUND ABDOMEN  LIMITED RIGHT UPPER QUADRANT COMPARISON:  None. FINDINGS: Gallbladder: The gallbladder appears to be filled with slightly a hyperechoic sludge. No sonographic Murphy sign. The gallbladder wall thickness measures 3 mm. No pericholecystic fluid. Common bile duct: Diameter: 5 mm Liver: No focal lesion identified. Within normal limits in parenchymal echogenicity. Portal vein is patent on color Doppler imaging with normal direction of blood flow towards the liver. Other: None. IMPRESSION: Gallbladder sludge.  No definite evidence of cholecystitis. Electronically Signed   By: Jonna Clark M.D.   On: 08/12/2019 01:58

## 2019-08-15 NOTE — Progress Notes (Signed)
Physical Therapy Treatment Patient Details Name: Kevin Long MRN: [id number] DOB: [birth year] Today's Date: 08/15/2019    History of Present Illness  59 y.o. male with PMHx of chronic systolic heart failure, HTN, DM-2-, OSA noncompliant to CPAP who presented with shortness of breath-found to have acute hypoxic respiratory failure secondary to PE, COVID-19 pneumonia, lung mass, decompensated heart failure (EF <20%), and possible bacterial pneumonia.  Post admission-blood cultures positive for MSSA. Encephalopathy    PT Comments    Pt was able to stand and pivot with two person min assist and RW bed to chair today, taking painful steps on his feet that feel like "painful cushions".  Pt was A and O x4, but still a bit nonsensical in his conversations with me.   I am hopeful to progress to short distance gait with RW and chair to follow next session. PT will continue to follow acutely for safe mobility progression.  Follow Up Recommendations  CIR     Equipment Recommendations  3in1 (PT);Rolling walker with 5" wheels;Wheelchair (measurements PT);Wheelchair cushion (measurements PT)    Recommendations for Other Services Rehab consult     Precautions / Restrictions Precautions Precautions: Fall;Other (comment) Precaution Comments: confusion    Mobility  Bed Mobility Overal bed mobility: Needs Assistance Bed Mobility: Supine to Sit     Supine to sit: Min assist;HOB elevated     General bed mobility comments: Min hand held assist to come up to sitting EOB.   Transfers Overall transfer level: Needs assistance Equipment used: Rolling walker (2 wheeled) Transfers: Sit to/from [facility] Sit to Stand: +2 physical assistance;Min assist;From elevated surface Stand pivot transfers: +2 physical assistance;Min assist;From elevated surface       General transfer comment: Two person min assist to stand from elevated bed onto tender feet, heavy reliance on bil UE  supporting to transfer to chair with two person assist and RW.           Balance Overall balance assessment: Needs assistance Sitting-balance support: Feet supported;Bilateral upper extremity supported Sitting balance-Leahy Scale: Good     Standing balance support: Bilateral upper extremity supported Standing balance-Leahy Scale: Poor Standing balance comment: needs external support from RW and therapist.                             Cognition Arousal/Alertness: Awake/alert Behavior During Therapy: WFL for tasks assessed/performed Overall Cognitive Status: No family/caregiver present to determine baseline cognitive functioning                                 General Comments: Pt was alert and oriented x4, but he still has some interesting confabulatory statements in the middle of conversation.       Exercises General Exercises - Lower Extremity Ankle Circles/Pumps: AROM;Both;10 reps Long Arc Quad: AROM;Both;10 reps Other Exercises Other Exercises: Flutter valve x 3 reps, incentive spirometer x 3 reps max inspired volume is 1,000 mL.  Cues for technique.     General Comments General comments (skin integrity, edema, etc.): Pt on RA today, VSS throughout.        Pertinent Vitals/Pain Pain Assessment: Faces Faces Pain Scale: Hurts even more Pain Location: bil feet, left worst Pain Descriptors / Indicators: Constant;Grimacing;Tender Pain Intervention(s): Limited activity within patient's tolerance;Monitored during session;Repositioned           PT Goals (current goals can now be found in  the care plan section) Acute Rehab PT Goals Patient Stated Goal: stop pain in feet Progress towards PT goals: Progressing toward goals    Frequency    Min 3X/week      PT Plan Current plan remains appropriate       AM-PAC PT "6 Clicks" Mobility   Outcome Measure  Help needed turning from your back to your side while in a flat bed without using  bedrails?: A Little Help needed moving from lying on your back to sitting on the side of a flat bed without using bedrails?: A Little Help needed moving to and from a bed to a chair (including a wheelchair)?: A Little Help needed standing up from a chair using your arms (e.g., wheelchair or bedside chair)?: A Little Help needed to walk in hospital room?: A Lot Help needed climbing 3-5 steps with a railing? : Total 6 Click Score: 15    End of Session Equipment Utilized During Treatment: Gait belt Activity Tolerance: Patient limited by pain Patient left: in chair;with call bell/phone within reach;with chair alarm set Nurse Communication: Mobility status PT Visit Diagnosis: Muscle weakness (generalized) (M62.81);Difficulty in walking, not elsewhere classified (R26.2);Pain Pain - Right/Left: (bilateral) Pain - part of body: Ankle and joints of foot     Time: 1610-9604 PT Time Calculation (min) (ACUTE ONLY): 22 min  Charges:  $Therapeutic Activity: 8-22 mins                    Corinna Capra, PT, DPT  Acute Rehabilitation [phone] pager #[phone] office     08/15/2019, 5:51 PM

## 2019-08-15 NOTE — Progress Notes (Signed)
Inpatient Rehab Admissions:  Inpatient Rehab Consult received.  Will follow for progress until pt off precautions (5/20).     Signed: Estill Dooms, PT, DPT Admissions Coordinator [phone] 08/15/19  11:33 AM

## 2019-08-16 LAB — GLUCOSE, CAPILLARY
Glucose-Capillary: 223 mg/dL — ABNORMAL HIGH (ref 70–99)
Glucose-Capillary: 226 mg/dL — ABNORMAL HIGH (ref 70–99)
Glucose-Capillary: 304 mg/dL — ABNORMAL HIGH (ref 70–99)
Glucose-Capillary: 132 mg/dL — ABNORMAL HIGH (ref 70–99)

## 2019-08-16 LAB — COMPREHENSIVE METABOLIC PANEL
ALT: 10 U/L (ref 0–44)
AST: 24 U/L (ref 15–41)
Albumin: 2.1 g/dL — ABNORMAL LOW (ref 3.5–5.0)
Alkaline Phosphatase: 58 U/L (ref 38–126)
Anion gap: 11 (ref 5–15)
BUN: 35 mg/dL — ABNORMAL HIGH (ref 6–20)
CO2: 24 mmol/L (ref 22–32)
Calcium: 8.2 mg/dL — ABNORMAL LOW (ref 8.9–10.3)
Chloride: 100 mmol/L (ref 98–111)
Creatinine, Ser: 1.5 mg/dL — ABNORMAL HIGH (ref 0.61–1.24)
GFR calc Af Amer: 58 mL/min — ABNORMAL LOW (ref >60)
GFR calc non Af Amer: 50 mL/min — ABNORMAL LOW (ref >60)
Glucose, Bld: 243 mg/dL — ABNORMAL HIGH (ref 70–99)
Potassium: 3.5 mmol/L (ref 3.5–5.1)
Sodium: 135 mmol/L (ref 135–145)
Total Bilirubin: 0.7 mg/dL (ref 0.3–1.2)
Total Protein: 5.9 g/dL — ABNORMAL LOW (ref 6.5–8.1)

## 2019-08-16 LAB — CBC WITH DIFFERENTIAL/PLATELET
Abs Immature Granulocytes: 0.1 10*3/uL — ABNORMAL HIGH (ref 0.00–0.07)
Basophils Absolute: 0 10*3/uL (ref 0.0–0.1)
Basophils Relative: 0 %
Eosinophils Absolute: 0 10*3/uL (ref 0.0–0.5)
Eosinophils Relative: 0 %
HCT: 43.8 % (ref 39.0–52.0)
Hemoglobin: 14.2 g/dL (ref 13.0–17.0)
Immature Granulocytes: 1 %
Lymphocytes Relative: 8 %
Lymphs Abs: 1 10*3/uL (ref 0.7–4.0)
MCH: 26.5 pg (ref 26.0–34.0)
MCHC: 32.4 g/dL (ref 30.0–36.0)
MCV: 81.9 fL (ref 80.0–100.0)
Monocytes Absolute: 0.6 10*3/uL (ref 0.1–1.0)
Monocytes Relative: 5 %
Neutro Abs: 10.6 10*3/uL — ABNORMAL HIGH (ref 1.7–7.7)
Neutrophils Relative %: 86 %
Platelets: 420 10*3/uL — ABNORMAL HIGH (ref 150–400)
RBC: 5.35 MIL/uL (ref 4.22–5.81)
RDW: 15.2 % (ref 11.5–15.5)
WBC: 12.3 10*3/uL — ABNORMAL HIGH (ref 4.0–10.5)
nRBC: 0 % (ref 0.0–0.2)

## 2019-08-16 LAB — D-DIMER, QUANTITATIVE: D-Dimer, Quant: 3.94 ug/mL-FEU — ABNORMAL HIGH (ref 0.00–0.50)

## 2019-08-16 LAB — C-REACTIVE PROTEIN: CRP: 4.7 mg/dL — ABNORMAL HIGH (ref <1.0)

## 2019-08-16 LAB — FERRITIN: Ferritin: 627 ng/mL — ABNORMAL HIGH (ref 24–336)

## 2019-08-16 MED ORDER — MAGNESIUM SULFATE IN D5W 1-5 GM/100ML-% IV SOLN
1.0000 g | Freq: Once | INTRAVENOUS | Status: AC
  Administered 2019-08-16: 1 g via INTRAVENOUS
  Filled 2019-08-16: qty 100

## 2019-08-16 MED ORDER — APIXABAN 5 MG PO TABS: 5.0000 mg | ORAL_TABLET | Freq: Two times a day (BID) | ORAL | Status: DC

## 2019-08-16 MED ORDER — POTASSIUM CHLORIDE CRYS ER 20 MEQ PO TBCR
40.0000 meq | EXTENDED_RELEASE_TABLET | Freq: Every day | ORAL | Status: DC
  Administered 2019-08-16 – 2019-08-17 (×2): 40 meq via ORAL
  Filled 2019-08-16 (×2): qty 2

## 2019-08-16 MED ORDER — APIXABAN 5 MG PO TABS
10.0000 mg | ORAL_TABLET | Freq: Two times a day (BID) | ORAL | Status: DC
  Administered 2019-08-16 – 2019-08-19 (×6): 10 mg via ORAL
  Filled 2019-08-16 (×6): qty 2

## 2019-08-16 NOTE — Progress Notes (Signed)
Nutrition Brief Note  RD consult for diet education regarding Type 2 Diabetes Mellitus. RD working remotely. Pt unavailable during attempted time of contact. Handout "[place] Carbohydrate for People with Diabetes" from the Academy of Nutrition and Dietetics Manual placed in discharge instructions. RD contact information given. RD to re-contact for diet education. Pt is currently on a heart healthy/carb modified diet with 1500 ml fluids. Meal completion has been 75-100%. Labs and medications reviewed. No further nutrition interventions at this time.   [name], MS, RD, LDN RD pager number/after hours weekend pager number on Amion.

## 2019-08-16 NOTE — Progress Notes (Signed)
Pharmacist Heart Failure Core Measure Documentation  Assessment: Kevin Long has an EF documented as < 20 % on 08/12/19 by Echo.  Rationale: Heart failure patients with left ventricular systolic dysfunction (LVSD) and an EF < 40% should be prescribed an angiotensin converting enzyme inhibitor (ACEI) or angiotensin receptor blocker (ARB) at discharge unless a contraindication is documented in the medical record.  This patient is not currently on an ACEI or ARB for HF.  This note is being placed in the record in order to provide documentation that a contraindication to the use of these agents is present for this encounter.  ACE Inhibitor or Angiotensin Receptor Blocker is contraindicated (specify all that apply)  []   ACEI allergy AND ARB allergy []   Angioedema []   Moderate or severe aortic stenosis []   Hyperkalemia []   Hypotension []   Renal artery stenosis [x]   Worsening renal function, preexisting renal disease or dysfunction  , RPh Clinical Pharmacist Please check AMION for all [facility] phone numbers After 10:00 PM, call Main Pharmacy [phone]   08/16/2019 3:27 PM

## 2019-08-16 NOTE — Progress Notes (Signed)
Occupational Therapy Evaluation Patient Details Name: Kevin Long MRN: [id number] DOB: 14-Jan-1961 Today's Date: 08/16/2019    History of Present Illness  59 y.o. male with PMHx of chronic systolic heart failure, HTN, DM-2-, OSA noncompliant to CPAP who presented with shortness of breath-found to have acute hypoxic respiratory failure secondary to PE, COVID-19 pneumonia, lung mass, decompensated heart failure (EF <20%), and possible bacterial pneumonia.  Post admission-blood cultures positive for MSSA. Encephalopathy   Clinical Impression   Patient pleasant but seems slightly confused and foggy.  He typically lives with family in a house and reports being independent prior to admission, though this differs from what he reported to PT.  Patient's processing speed was poor and he had trouble following multistep commands.  Patient requiring min assist with transfer to [facility] but needs verbal cueing for safety.  Requiring set up for seated ADLs and mod assist with LB ADLs.  Patient demonstrating poor activity tolerance as he fatigued quickly from minimal activity. Will continue to follow with OT acutely to address the deficits listed below.      Follow Up Recommendations  CIR    Equipment Recommendations  3 in 1 bedside commode    Recommendations for Other Services       Precautions / Restrictions Precautions Precautions: Fall;Other (comment) Precaution Comments: confusion      Mobility Bed Mobility Overal bed mobility: Needs Assistance Bed Mobility: Supine to Sit;Sit to Supine     Supine to sit: Min assist;HOB elevated Sit to supine: Min assist      Transfers Overall transfer level: Needs assistance Equipment used: 1 person hand held assist Transfers: Sit to/from [facility] Sit to Stand: Min assist Stand pivot transfers: Min assist            Balance Overall balance assessment: Needs assistance Sitting-balance support: No upper extremity supported;Feet  supported Sitting balance-Leahy Scale: Fair     Standing balance support: Single extremity supported Standing balance-Leahy Scale: Poor Standing balance comment: Needed therapist support                           ADL either performed or assessed with clinical judgement   ADL Overall ADL's : Needs assistance/impaired Eating/Feeding: Sitting;Independent   Grooming: Set up;Sitting   Upper Body Bathing: Set up;Sitting   Lower Body Bathing: Moderate assistance;Sit to/from stand   Upper Body Dressing : Set up;Sitting   Lower Body Dressing: Moderate assistance;Sit to/from stand   Toilet Transfer: Minimal assistance;Stand-pivot;BSC           Functional mobility during ADLs: Minimal assistance;Rolling walker       Vision         Perception     Praxis      Pertinent Vitals/Pain Pain Assessment: 0-10 Pain Score: 5  Pain Location: stomach pain Pain Descriptors / Indicators: Constant;Grimacing;Tender Pain Intervention(s): Monitored during session     Hand Dominance     Extremity/Trunk Assessment Upper Extremity Assessment Upper Extremity Assessment: Generalized weakness           Communication Communication Communication: No difficulties   Cognition Arousal/Alertness: Lethargic Behavior During Therapy: WFL for tasks assessed/performed Overall Cognitive Status: No family/caregiver present to determine baseline cognitive functioning                                 General Comments: Not oriented to day, though knew month/year.  Seems foggy, processing  slow   General Comments  On RA, SpO2 100    Exercises     Shoulder Instructions      Home Living Family/patient expects to be discharged to:: Private residence Living Arrangements: Children;Spouse/significant other Available Help at Discharge: Family Type of Home: House Home Access: Stairs to enter [employer] of Steps: 2 Entrance Stairs-Rails: None Home Layout: Two  level;Able to live on main level with bedroom/bathroom Alternate Level Stairs-Number of Steps: 2 Alternate Level Stairs-Rails: None Bathroom Shower/Tub: Tub/shower unit         Home Equipment: Crutches;Walker - 2 wheels;Shower seat          Prior Functioning/Environment Level of Independence: Independent        Comments: Patient states he was walking around and completing ADLs without assist before getting sick, though this differs from what he reported to PT        OT Problem List: Decreased strength;Decreased activity tolerance;Impaired balance (sitting and/or standing);Decreased cognition;Decreased safety awareness      OT Treatment/Interventions: Self-care/ADL training;Therapeutic exercise;Energy conservation;DME and/or AE instruction;Therapeutic activities;Balance training;Patient/family education    OT Goals(Current goals can be found in the care plan section) Acute Rehab OT Goals Patient Stated Goal: Go home OT Goal Formulation: With patient Time For Goal Achievement: 08/30/19 Potential to Achieve Goals: Good ADL Goals Pt Will Perform Grooming: with supervision;standing Pt Will Perform Lower Body Dressing: with supervision;sit to/from stand Pt Will Transfer to Toilet: with modified independence;ambulating;regular height toilet Additional ADL Goal #1: Patient will independently verbalize 3 fall prevention techniques. Additional ADL Goal #2: Patient will increase activity tolerance to 10 min of standing ADLs.  OT Frequency: Min 2X/week   Barriers to D/C:            Co-evaluation              AM-PAC OT "6 Clicks" Daily Activity     Outcome Measure Help from another person eating meals?: None Help from another person taking care of personal grooming?: A Little Help from another person toileting, which includes using toliet, bedpan, or urinal?: A Little Help from another person bathing (including washing, rinsing, drying)?: A Lot Help from another person  to put on and taking off regular upper body clothing?: A Little Help from another person to put on and taking off regular lower body clothing?: A Lot 6 Click Score: 17   End of Session Equipment Utilized During Treatment: Gait belt Nurse Communication: Mobility status  Activity Tolerance: Patient tolerated treatment well Patient left: in bed;with call bell/phone within reach;with bed alarm set  OT Visit Diagnosis: Unsteadiness on feet (R26.81);Muscle weakness (generalized) (M62.81);Pain;Other symptoms and signs involving cognitive function Pain - Right/Left: (stomach) Pain - part of body: (stomach)                Time: 0737-1062 OT Time Calculation (min): 39 min Charges:  OT General Charges $OT Visit: 1 Visit OT Evaluation $OT Eval Moderate Complexity: 1 Mod OT Treatments $Self Care/Home Management : 23-37 mins  Barbie Banner, OTR/L   Kevin Long 08/16/2019, 3:32 PM

## 2019-08-16 NOTE — Discharge Instructions (Signed)
[facility]ocrin[facility] [phone]) 1. Dr. Philemon Kingdom 2. Dr. Janie [facility] [phone]) 1. Dr. Delrae Rend [facility] [phone]) 1. Dr. Jacelyn Pi 2. Dr. Anda Kraft [facility] [phone]) 1. Dr. Daneil Dolin [facility] [phone]) [[place] office]  [phone]) [[place] office] 1. Dr. Lenna Sciara Solum 2. Dr. Mee Hives [facility]) [phone]) 1. Autumn Hudnall Ronnald Ramp), PA 2. Dr. Amalia Greenhouse 3. Dr. Marsh Dolly. [facility] [phone]) 1. Dr. Glade Lloyd [facility] [phone]) 1. Dr. Orville Govern 2. Dr. Lelon Huh 3. Dr. Jerelene Redden 4. Alwyn Ren, FNP Dr. Carolynn Serve. [facility] [place] [phone])  Blood Glucose Monitoring, Adult Monitoring your blood sugar (glucose) is an important part of managing your diabetes (diabetes mellitus). Blood glucose monitoring involves checking your blood glucose as often as directed and keeping a record (log) of your results over time. Checking your blood glucose regularly and keeping a blood glucose log can:  Help you and your health care provider adjust your diabetes management plan as needed, including your medicines or insulin.  Help you understand how food, exercise, illnesses, and medicines affect your blood glucose.  Let you know what your blood glucose is at any time. You can quickly find out if you have low blood glucose (hypoglycemia) or high blood glucose (hyperglycemia). Your health care provider will set individualized treatment goals for you. Your goals will be based on your age, other medical conditions you have, and how you respond to diabetes treatment. Generally, the goal of treatment is to maintain the following blood glucose levels:  Before meals (preprandial): 80-130 mg/dL (4.4-7.2  mmol/L).  After meals (postprandial): below 180 mg/dL (10 mmol/L).  A1c level: less than 7%. Supplies needed:  Blood glucose meter.  Test strips for your meter. Each meter has its own strips. You must use the strips that came with your meter.  A needle to prick your finger (lancet). Do not use a lancet more than one time.  A device that holds the lancet (lancing device).  A journal or log book to write down your results. How to check your blood glucose  1. Wash your hands with soap and water. 2. Prick the side of your finger (not the tip) with the lancet. Use a different finger each time. 3. Gently rub the finger until a small drop of blood appears. 4. Follow instructions that come with your meter for inserting the test strip, applying blood to the strip, and using your blood glucose meter. 5. Write down your result and any notes. Some meters allow you to use areas of your body other than your finger (alternative sites) to test your blood. The most common alternative sites are:  Forearm.  Thigh.  Palm of the hand. If you think you may have hypoglycemia, or if you have a history of not knowing when your blood glucose is getting low (hypoglycemia unawareness), do not use alternative sites. Use your finger instead. Alternative sites may not be as accurate as the fingers, because blood flow is slower in these areas. This means that the result you get may be delayed, and it may be different from the result that you would get from your finger. Follow these instructions at home: Blood glucose log   Every time you check your blood glucose, write down your result. Also write down any notes about things that may be affecting your blood glucose, such as your diet and exercise  for the day. This information can help you and your health care provider: ? Look for patterns in your blood glucose over time. ? Adjust your diabetes management plan as needed.  Check if your meter allows you to  download your records to a computer. Most glucose meters store a record of glucose readings in the meter. If you have type 1 diabetes:  Check your blood glucose 2 or more times a day.  Also check your blood glucose: ? Before every insulin injection. ? Before and after exercise. ? Before meals. ? 2 hours after a meal. ? Occasionally between 2:00 a.m. and 3:00 a.m., as directed. ? Before potentially dangerous tasks, like driving or using heavy machinery. ? At bedtime.  You may need to check your blood glucose more often, up to 6-10 times a day, if you: ? Use an insulin pump. ? Need multiple daily injections (MDI). ? Have diabetes that is not well-controlled. ? Are ill. ? Have a history of severe hypoglycemia. ? Have hypoglycemia unawareness. If you have type 2 diabetes:  If you take insulin or other diabetes medicines, check your blood glucose 2 or more times a day.  If you are on intensive insulin therapy, check your blood glucose 4 or more times a day. Occasionally, you may also need to check between 2:00 a.m. and 3:00 a.m., as directed.  Also check your blood glucose: ? Before and after exercise. ? Before potentially dangerous tasks, like driving or using heavy machinery.  You may need to check your blood glucose more often if: ? Your medicine is being adjusted. ? Your diabetes is not well-controlled. ? You are ill. General tips  Always keep your supplies with you.  If you have questions or need help, all blood glucose meters have a 24-hour "hotline" phone number that you can call. You may also contact your health care provider.  After you use a few boxes of test strips, adjust (calibrate) your blood glucose meter by following instructions that came with your meter. Contact a health care provider if:  Your blood glucose is at or above 240 mg/dL (13.3 mmol/L) for 2 days in a row.  You have been sick or have had a fever for 2 days or longer, and you are not getting  better.  You have any of the following problems for more than 6 hours: ? You cannot eat or drink. ? You have nausea or vomiting. ? You have diarrhea. Get help right away if:  Your blood glucose is lower than 54 mg/dL (3 mmol/L).  You become confused or you have trouble thinking clearly.  You have difficulty breathing.  You have moderate or large ketone levels in your urine. Summary  Monitoring your blood sugar (glucose) is an important part of managing your diabetes (diabetes mellitus).  Blood glucose monitoring involves checking your blood glucose as often as directed and keeping a record (log) of your results over time.  Your health care provider will set individualized treatment goals for you. Your goals will be based on your age, other medical conditions you have, and how you respond to diabetes treatment.  Every time you check your blood glucose, write down your result. Also write down any notes about things that may be affecting your blood glucose, such as your diet and exercise for the day. This information is not intended to replace advice given to you by your health care provider. Make sure you discuss any questions you have with your health care provider. Document  Revised: 01/08/2018 Document Reviewed: 08/27/2015 Elsevier Patient Education  [place]. Diabetes Mellitus and Sick Day Management Blood sugar (glucose) can be difficult to control when you are sick. Common illnesses that can cause problems for people with diabetes (diabetes mellitus) include colds, fever, flu (influenza), nausea, vomiting, and diarrhea. These illnesses can cause stress and loss of body fluids (dehydration), and those issues can cause blood glucose levels to increase. Because of this, it is very important to take your insulin and diabetes medicines and eat some form of carbohydrate when you are sick. You should make a plan for days when you are sick (sick day plan) as part of your diabetes  management plan. You and your health care provider should make this plan in advance. The following guidelines are intended to help you manage an illness that lasts for about 24 hours or less. Your health care provider may also give you more specific instructions. What do I need to do to manage my blood glucose?   Check your blood glucose every 2-4 hours, or as often as told by your health care provider.  Know your sick day treatment goals. Your target blood glucose levels may be different when you are sick.  If you use insulin, take your usual dose. ? If your blood glucose continues to be too high, you may need to take an additional insulin dose as told by your health care provider.  If you use oral diabetes medicine, you may need to stop taking it if you are not able to eat or drink normally. Ask your health care provider about whether you need to stop taking these medicines while you are sick.  If you use injectable hormone medicines other than insulin to control your diabetes, ask your health care provider about whether you need to stop taking these medicines while you are sick. What else can I do to manage my diabetes when I am sick? Check your ketones  If you have type 1 diabetes, check your urine ketones every 4 hours.  If you have type 2 diabetes, check your urine ketones as often as told by your health care provider. Drink fluids  Drink enough fluid to keep your urine clear or pale yellow. This is especially important if you have a fever, vomiting, or diarrhea. Those symptoms can lead to dehydration.  Follow any instructions from your health care provider about beverages to avoid. ? Do not drink alcohol, caffeine, or drinks that contain a lot of sugar. Take medicines as directed  Take-over-the-counter and prescription medicines only as told by your health care provider.  Check medicine labels for added sugars. Some medicines may contain sugar or types of sugars that can raise  your blood glucose level. What foods can I eat when I am sick?  You need to eat some form of carbohydrates when you are sick. You should eat 45-50 grams (45-50 g) of carbohydrates every 3-4 hours until you feel better. All of the food choices below contain about 15 g of carbohydrates. Plan ahead and keep some of these foods around so you have them if you get sick.  4-6 oz (120-177 mL) carbonated beverage that contains sugar, such as regular (not diet) soda. You may be able to drink carbonated beverages more easily if you open the beverage and let it sit at room temperature for a few minutes before drinking.   of a twin frozen ice pop.  4 oz (120 g) regular gelatin.  4 oz (120 mL) fruit  juice.  4 oz (120 g) ice cream or frozen yogurt.  2 oz (60 g) sherbet.  8 oz (240 mL) clear broth or soup.  4 oz (120 g) regular custard.  4 oz (120 g) regular pudding.  8 oz (240 g) plain yogurt.  1 slice bread or toast.  6 saltine crackers.  5 vanilla wafers. Questions to ask your health care provider Consider asking the following questions so you know what to do on days when you are sick:  Should I adjust my diabetes medicines?  How often do I need to check my blood glucose?  What supplies do I need to manage my diabetes at home when I am sick?  What number can I call if I have questions?  What foods and drinks should I avoid? Contact a health care provider if:  You develop symptoms of diabetic ketoacidosis, such as: ? Fatigue. ? Weight loss. ? Excessive thirst. ? Light-headedness. ? Fruity or sweet-smelling breath. ? Excessive urination. ? Vision changes. ? Confusion or irritability. ? Nausea. ? Vomiting. ? Rapid breathing. ? Pain in the abdomen. ? Feeling flushed.  You are unable to drink fluids without vomiting.  You have any of the following for more than 6 hours: ? Nausea. ? Vomiting. ? Diarrhea.  Your blood glucose is at or above 240 mg/dL (13.3 mmol/L), even  after you take an additional insulin dose.  You have a change in how you think, feel, or act (mental status).  You develop another serious illness.  You have been sick or have had a fever for 2 days or longer and you are not getting better. Get help right away if:  Your blood glucose is lower than 54 mg/dL (3.0 mmol/L).  You have difficulty breathing.  You have moderate or high ketone levels in your urine.  You used emergency glucagon to treat low blood glucose. Summary  Blood sugar (glucose) can be difficult to control when you are sick. Common illnesses that can cause problems for people with diabetes (diabetes mellitus) include colds, fever, flu (influenza), nausea, vomiting, and diarrhea.  Illnesses can cause stress and loss of body fluids (dehydration), and those issues can cause blood glucose levels to increase.  Make a plan for days when you are sick (sick day plan) as part of your diabetes management plan. You and your health care provider should make this plan in advance.  It is very important to take your insulin and diabetes medicines and to eat some form of carbohydrate when you are sick.  Contact your health care provider if have problems managing your blood glucose levels when you are sick, or if you have been sick or had a fever for 2 days or longer and are not getting better. This information is not intended to replace advice given to you by your health care provider. Make sure you discuss any questions you have with your health care provider. Document Revised: 12/14/2015 Document Reviewed: 12/14/2015 Elsevier Patient Education  [place]. Hypoglycemia Hypoglycemia is when the sugar (glucose) level in your blood is too low. Signs of low blood sugar may include:  Feeling: ? Hungry. ? Worried or nervous (anxious). ? Sweaty and clammy. ? Confused. ? Dizzy. ? Sleepy. ? Sick to your stomach (nauseous).  Having: ? A fast heartbeat. ? A headache. ? A  change in your vision. ? Tingling or no feeling (numbness) around your mouth, lips, or tongue. ? Jerky movements that you cannot control (seizure).  Having trouble with: ?  Moving (coordination). ? Sleeping. ? Passing out (fainting). ? Getting upset easily (irritability). Low blood sugar can happen to people who have diabetes and people who do not have diabetes. Low blood sugar can happen quickly, and it can be an emergency. Treating low blood sugar Low blood sugar is often treated by eating or drinking something sugary right away, such as:  Fruit juice, 4-6 oz (120-150 mL).  Regular soda (not diet soda), 4-6 oz (120-150 mL).  Low-fat milk, 4 oz (120 mL).  Several pieces of hard candy.  Sugar or honey, 1 Tbsp (15 mL). Treating low blood sugar if you have diabetes If you can think clearly and swallow safely, follow the 15:15 rule:  Take 15 grams of a fast-acting carb (carbohydrate). Talk with your doctor about how much you should take.  Always keep a source of fast-acting carb with you, such as: ? Sugar tablets (glucose pills). Take 3-4 pills. ? 6-8 pieces of hard candy. ? 4-6 oz (120-150 mL) of fruit juice. ? 4-6 oz (120-150 mL) of regular (not diet) soda. ? 1 Tbsp (15 mL) honey or sugar.  Check your blood sugar 15 minutes after you take the carb.  If your blood sugar is still at or below 70 mg/dL (3.9 mmol/L), take 15 grams of a carb again.  If your blood sugar does not go above 70 mg/dL (3.9 mmol/L) after 3 tries, get help right away.  After your blood sugar goes back to normal, eat a meal or a snack within 1 hour.  Treating very low blood sugar If your blood sugar is at or below 54 mg/dL (3 mmol/L), you have very low blood sugar (severe hypoglycemia). This may also cause:  Passing out.  Jerky movements you cannot control (seizure).  Losing consciousness (coma). This is an emergency. Do not wait to see if the symptoms will go away. Get medical help right away. Call  your local emergency services (911 in the [place] Do not drive yourself to the hospital. If you have very low blood sugar and you cannot eat or drink, you may need a glucagon shot (injection). A family member or friend should learn how to check your blood sugar and how to give you a glucagon shot. Ask your doctor if you need to have a glucagon shot kit at home. Follow these instructions at home: General instructions  Take over-the-counter and prescription medicines only as told by your doctor.  Stay aware of your blood sugar as told by your doctor.  Limit alcohol intake to no more than 1 drink a day for nonpregnant women and 2 drinks a day for men. One drink equals 12 oz of beer (355 mL), 5 oz of wine (148 mL), or 1 oz of hard liquor (44 mL).  Keep all follow-up visits as told by your doctor. This is important. If you have diabetes:   Follow your diabetes care plan as told by your doctor. Make sure you: ? Know the signs of low blood sugar. ? Take your medicines as told. ? Follow your exercise and meal plan. ? Eat on time. Do not skip meals. ? Check your blood sugar as often as told by your doctor. Always check it before and after exercise. ? Follow your sick day plan when you cannot eat or drink normally. Make this plan ahead of time with your doctor.  Share your diabetes care plan with: ? Your work or school. ? People you live with.  Check your pee (urine) for ketones: ?  When you are sick. ? As told by your doctor.  Carry a card or wear jewelry that says you have diabetes. Contact a doctor if:  You have trouble keeping your blood sugar in your target range.  You have low blood sugar often. Get help right away if:  You still have symptoms after you eat or drink something sugary.  Your blood sugar is at or below 54 mg/dL (3 mmol/L).  You have jerky movements that you cannot control.  You pass out. These symptoms may be an emergency. Do not wait to see if the symptoms will  go away. Get medical help right away. Call your local emergency services (911 in the [place] Do not drive yourself to the hospital. Summary  Hypoglycemia happens when the level of sugar (glucose) in your blood is too low.  Low blood sugar can happen to people who have diabetes and people who do not have diabetes. Low blood sugar can happen quickly, and it can be an emergency.  Make sure you know the signs of low blood sugar and know how to treat it.  Always keep a source of sugar (fast-acting carb) with you to treat low blood sugar. This information is not intended to replace advice given to you by your health care provider. Make sure you discuss any questions you have with your health care provider. Document Revised: 07/08/2018 Document Reviewed: 04/20/2015 Elsevier Patient Education  [place]. Hyperglycemia Hyperglycemia occurs when the level of sugar (glucose) in the blood is too high. Glucose is a type of sugar that provides the body's main source of energy. Certain hormones (insulin and glucagon) control the level of glucose in the blood. Insulin lowers blood glucose, and glucagon increases blood glucose. Hyperglycemia can result from having too little insulin in the bloodstream, or from the body not responding normally to insulin. Hyperglycemia occurs most often in people who have diabetes (diabetes mellitus), but it can happen in people who do not have diabetes. It can develop quickly, and it can be life-threatening if it causes you to become severely dehydrated (diabetic ketoacidosis or hyperglycemic hyperosmolar state). Severe hyperglycemia is a medical emergency. What are the causes? If you have diabetes, hyperglycemia may be caused by:  Diabetes medicine.  Medicines that increase blood glucose or affect your diabetes control.  Not eating enough, or not eating often enough.  Changes in physical activity level.  Being sick or having an infection. If you have prediabetes  or undiagnosed diabetes:  Hyperglycemia may be caused by those conditions. If you do not have diabetes, hyperglycemia may be caused by:  Certain medicines, including steroid medicines, beta-blockers, epinephrine, and thiazide diuretics.  Stress.  Serious illness.  Surgery.  Diseases of the pancreas.  Infection. What increases the risk? Hyperglycemia is more likely to develop in people who have risk factors for diabetes, such as:  Having a family member with diabetes.  Having a gene for type 1 diabetes that is passed from parent to child (inherited).  Living in an area with cold weather conditions.  Exposure to certain viruses.  Certain conditions in which the body's disease-fighting (immune) system attacks itself (autoimmune disorders).  Being overweight or obese.  Having an inactive (sedentary) lifestyle.  Having been diagnosed with insulin resistance.  Having a history of prediabetes, gestational diabetes, or polycystic ovarian syndrome (PCOS).  Being of American-Indian, African-American, Hispanic/Latino, or Asian/Pacific Islander descent. What are the signs or symptoms? Hyperglycemia may not cause any symptoms. If you do have symptoms, they  may include early warning signs, such as:  Increased thirst.  Hunger.  Feeling very tired.  Needing to urinate more often than usual.  Blurry vision. Other symptoms may develop if hyperglycemia gets worse, such as:  Dry mouth.  Loss of appetite.  Fruity-smelling breath.  Weakness.  Unexpected or rapid weight gain or weight loss.  Tingling or numbness in the hands or feet.  Headache.  Skin that does not quickly return to normal after being lightly pinched and released (poor skin turgor).  Abdominal pain.  Cuts or bruises that are slow to heal. How is this diagnosed? Hyperglycemia is diagnosed with a blood test to measure your blood glucose level. This blood test is usually done while you are having  symptoms. Your health care provider may also do a physical exam and review your medical history. You may have more tests to determine the cause of your hyperglycemia, such as:  A fasting blood glucose (FBG) test. You will not be allowed to eat (you will fast) for at least 8 hours before a blood sample is taken.  An A1c (hemoglobin A1c) blood test. This provides information about blood glucose control over the previous 2-3 months.  An oral glucose tolerance test (OGTT). This measures your blood glucose at two times: ? After fasting. This is your baseline blood glucose level. ? Two hours after drinking a beverage that contains glucose. How is this treated? Treatment depends on the cause of your hyperglycemia. Treatment may include:  Taking medicine to regulate your blood glucose levels. If you take insulin or other diabetes medicines, your medicine or dosage may be adjusted.  Lifestyle changes, such as exercising more, eating healthier foods, or losing weight.  Treating an illness or infection, if this caused your hyperglycemia.  Checking your blood glucose more often.  Stopping or reducing steroid medicines, if these caused your hyperglycemia. If your hyperglycemia becomes severe and it results in hyperglycemic hyperosmolar state, you must be hospitalized and given IV fluids. Follow these instructions at home:  General instructions  Take over-the-counter and prescription medicines only as told by your health care provider.  Do not use any products that contain nicotine or tobacco, such as cigarettes and e-cigarettes. If you need help quitting, ask your health care provider.  Limit alcohol intake to no more than 1 drink per day for nonpregnant women and 2 drinks per day for men. One drink equals 12 oz of beer, 5 oz of wine, or 1 oz of hard liquor.  Learn to manage stress. If you need help with this, ask your health care provider.  Keep all follow-up visits as told by your health  care provider. This is important. Eating and drinking   Maintain a healthy weight.  Exercise regularly, as directed by your health care provider.  Stay hydrated, especially when you exercise, get sick, or spend time in hot temperatures.  Eat healthy foods, such as: ? Lean proteins. ? Complex carbohydrates. ? Fresh fruits and vegetables. ? Low-fat dairy products. ? Healthy fats.  Drink enough fluid to keep your urine clear or pale yellow. If you have diabetes:  Make sure you know the symptoms of hyperglycemia.  Follow your diabetes management plan, as told by your health care provider. Make sure you: ? Take your insulin and medicines as directed. ? Follow your exercise plan. ? Follow your meal plan. Eat on time, and do not skip meals. ? Check your blood glucose as often as directed. Make sure to check your blood glucose  before and after exercise. If you exercise longer or in a different way than usual, check your blood glucose more often. ? Follow your sick day plan whenever you cannot eat or drink normally. Make this plan in advance with your health care provider.  Share your diabetes management plan with people in your workplace, school, and household.  Check your urine for ketones when you are ill and as told by your health care provider.  Carry a medical alert card or wear medical alert jewelry. Contact a health care provider if:  Your blood glucose is at or above 240 mg/dL (13.3 mmol/L) for 2 days in a row.  You have problems keeping your blood glucose in your target range.  You have frequent episodes of hyperglycemia. Get help right away if:  You have difficulty breathing.  You have a change in how you think, feel, or act (mental status).  You have nausea or vomiting that does not go away. These symptoms may represent a serious problem that is an emergency. Do not wait to see if the symptoms will go away. Get medical help right away. Call your local emergency  services (911 in the [place] Do not drive yourself to the hospital. Summary  Hyperglycemia occurs when the level of sugar (glucose) in the blood is too high.  Hyperglycemia is diagnosed with a blood test to measure your blood glucose level. This blood test is usually done while you are having symptoms. Your health care provider may also do a physical exam and review your medical history.  If you have diabetes, follow your diabetes management plan as told by your health care provider.  Contact your health care provider if you have problems keeping your blood glucose in your target range. This information is not intended to replace advice given to you by your health care provider. Make sure you discuss any questions you have with your health care provider. Document Revised: 12/03/2015 Document Reviewed: 12/03/2015 Elsevier Patient Education  [place]. Hemoglobin A1c Test Why am I having this test? You may have the hemoglobin A1c test (HbA1c test) done to:  Evaluate your risk for developing diabetes (diabetes mellitus).  Diagnose diabetes.  Monitor long-term control of blood sugar (glucose) in people who have diabetes and help make treatment decisions. This test may be done with other blood glucose tests, such as fasting blood glucose and oral glucose tolerance tests. What is being tested? Hemoglobin is a type of protein in the blood that carries oxygen. Glucose attaches to hemoglobin to form glycated hemoglobin. This test checks the amount of glycated hemoglobin in your blood, which is a good indicator of the average amount of glucose in your blood during the past 2-3 months. What kind of sample is taken?  A blood sample is required for this test. It is usually collected by inserting a needle into a blood vessel. Tell a health care provider about:  All medicines you are taking, including vitamins, herbs, eye drops, creams, and over-the-counter medicines.  Any blood disorders  you have.  Any surgeries you have had.  Any medical conditions you have.  Whether you are pregnant or may be pregnant. How are the results reported? Your results will be reported as a percentage that indicates how much of your hemoglobin has glucose attached to it (is glycated). Your health care provider will compare your results to normal ranges that were established after testing a large group of people (reference ranges). Reference ranges may vary among labs and hospitals.  For this test, common reference ranges are:  Adult or child without diabetes: 4-5.6%.  Adult or child with diabetes and good blood glucose control: less than 7%. What do the results mean? If you have diabetes:  A result of less than 7% is considered normal, meaning that your blood glucose is well controlled.  A result higher than 7% means that your blood glucose is not well controlled, and your treatment plan may need to be adjusted. If you do not have diabetes:  A result within the reference range is considered normal, meaning that you are not at high risk for diabetes.  A result of 5.7-6.4% means that you have a high risk of developing diabetes, and you may have prediabetes. Prediabetes is the condition of having a blood glucose level that is higher than it should be, but not high enough for you to be diagnosed with diabetes. Having prediabetes puts you at risk for developing type 2 diabetes (type 2 diabetes mellitus). You may have more tests, including a repeat HbA1c test.  Results of 6.5% or higher on two separate HbA1c tests mean that you have diabetes. You may have more tests to confirm the diagnosis. Abnormally low HbA1c values may be caused by:  Pregnancy.  Severe blood loss.  Receiving donated blood (transfusions).  Low red blood cell count (anemia).  Long-term kidney failure.  Some unusual forms (variants) of hemoglobin. Talk with your health care provider about what your results mean. Questions  to ask your health care provider Ask your health care provider, or the department that is doing the test:  When will my results be ready?  How will I get my results?  What are my treatment options?  What other tests do I need?  What are my next steps? Summary  The hemoglobin A1c test (HbA1c test) may be done to evaluate your risk for developing diabetes, to diagnose diabetes, and to monitor long-term control of blood sugar (glucose) in people who have diabetes and help make treatment decisions.  Hemoglobin is a type of protein in the blood that carries oxygen. Glucose attaches to hemoglobin to form glycated hemoglobin. This test checks the amount of glycated hemoglobin in your blood, which is a good indicator of the average amount of glucose in your blood during the past 2-3 months.  Talk with your health care provider about what your results mean. This information is not intended to replace advice given to you by your health care provider. Make sure you discuss any questions you have with your health care provider. Document Revised: 02/27/2017 Document Reviewed: 10/28/2016 Elsevier Patient Education  2020 [place] on my medicine - ELIQUIS (apixaban)  This medication education was reviewed with me or my healthcare representative as part of my discharge preparation.   Why was Eliquis prescribed for you? 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  What do You need to know about Eliquis ? The starting dose is 10 mg (two 5 mg tablets) taken TWICE daily for the FIRST SEVEN (7) DAYS, then on 08/23/19  the dose is reduced to ONE 5 mg tablet taken TWICE daily.  Eliquis may be taken with or without food.   Try to take the dose about the same time in the morning and in the evening. If you have difficulty  swallowing the  tablet whole please discuss with your pharmacist how to take the medication safely.  Take Eliquis exactly as prescribed and DO NOT stop taking Eliquis without talking to the doctor who prescribed the medication.  Stopping may increase your risk of developing a new blood clot.  Refill your prescription before you run out.  After discharge, you should have regular check-up appointments with your healthcare provider that is prescribing your Eliquis.    What do you do if you miss a dose? If a dose of ELIQUIS is not taken at the scheduled time, take it as soon as possible on the same day and twice-daily administration should be resumed. The dose should not be doubled to make up for a missed dose.  Important Safety Information A possible side effect of Eliquis is bleeding. You should call your healthcare provider right away if you experience any of the following: ? Bleeding from an injury or your nose that does not stop. ? Unusual colored urine (red or dark brown) or unusual colored stools (red or black). ? Unusual bruising for unknown reasons. ? A serious fall or if you hit your head (even if there is no bleeding).  Some medicines may interact with Eliquis and might increase your risk of bleeding or clotting while on Eliquis. To help avoid this, consult your healthcare provider or pharmacist prior to using any new prescription or non-prescription medications, including herbals, vitamins, non-steroidal anti-inflammatory drugs (NSAIDs) and supplements.  This website has more information on Eliquis (apixaban): http://[email or web address].[email or web address]  _______________________________________________________________________  Carbohydrate Counting For People With Diabetes  Foods with carbohydrates make your blood glucose level go up. Learning how to count carbohydrates can help you control your blood glucose levels. First, identify the foods you eat that contain carbohydrates. Then,  using the Foods with Carbohydrates chart, determine about how much carbohydrates are in your meals and snacks. Make sure you are eating foods with fiber, protein, and healthy fat along with your carbohydrate foods. Foods with Carbohydrates The following table shows carbohydrate foods that have about 15 grams of carbohydrate each. Using measuring cups, spoons, or a food scale when you first begin learning about carbohydrate counting can help you learn about the portion sizes you typically eat. The following foods have 15 grams carbohydrate each:  Grains . 1 slice bread (1 ounce)  . 1 small tortilla (6-inch size)  .  large bagel (1 ounce)  . 1/3 cup pasta or rice (cooked)  .  hamburger or hot dog bun ( ounce)  .  cup cooked cereal  .  to  cup ready-to-eat cereal  . 2 taco shells (5-inch size) Fruit . 1 small fresh fruit ( to 1 cup)  .  medium banana  . 17 small grapes (3 ounces)  . 1 cup melon or berries  .  cup canned or frozen fruit  . 2 tablespoons dried fruit (blueberries, cherries, cranberries, raisins)  .  cup unsweetened fruit juice  Starchy Vegetables .  cup cooked beans, peas, corn, potatoes/sweet potatoes  .  large baked potato (3 ounces)  . 1 cup acorn or butternut squash  Snack Foods . 3 to 6 crackers  . 8 potato chips or 13 tortilla chips ( ounce to 1 ounce)  . 3 cups popped popcorn  Dairy . 3/4 cup (6 ounces) nonfat plain yogurt, or yogurt with sugar-free sweetener  . 1 cup milk  . 1 cup plain rice, soy, coconut or flavored almond milk Sweets and Desserts .  cup ice cream or  frozen yogurt  . 1 tablespoon jam, jelly, pancake syrup, table sugar, or honey  . 2 tablespoons light pancake syrup  . 1 inch square of frosted cake or 2 inch square of unfrosted cake  . 2 small cookies (2/3 ounce each) or  large cookie  Sometimes you'll have to estimate carbohydrate amounts if you don't know the exact recipe. One cup of mixed foods like soups can have 1 to 2  carbohydrate servings, while some casseroles might have 2 or more servings of carbohydrate. Foods that have less than 20 calories in each serving can be counted as "free" foods. Count 1 cup raw vegetables, or  cup cooked non-starchy vegetables as "free" foods. If you eat 3 or more servings at one meal, then count them as 1 carbohydrate serving.  Foods without Carbohydrates  Not all foods contain carbohydrates. Meat, some dairy, fats, non-starchy vegetables, and many beverages don't contain carbohydrate. So when you count carbohydrates, you can generally exclude chicken, pork, beef, fish, seafood, eggs, tofu, cheese, butter, sour cream, avocado, nuts, seeds, olives, mayonnaise, water, black coffee, unsweetened tea, and zero-calorie drinks. Vegetables with no or low carbohydrate include green beans, cauliflower, tomatoes, and onions. How much carbohydrate should I eat at each meal?  Carbohydrate counting can help you plan your meals and manage your weight. Following are some starting points for carbohydrate intake at each meal. Work with your registered dietitian nutritionist to find the best range that works for your blood glucose and weight.   To Lose Weight To Maintain Weight  Women 2 - 3 carb servings 3 - 4 carb servings  Men 3 - 4 carb servings 4 - 5 carb servings  Checking your blood glucose after meals will help you know if you need to adjust the timing, type, or number of carbohydrate servings in your meal plan. Achieve and keep a healthy body weight by balancing your food intake and physical activity.  Tips How should I plan my meals?  Plan for half the food on your plate to include non-starchy vegetables, like salad greens, broccoli, or carrots. Try to eat 3 to 5 servings of non-starchy vegetables every day. Have a protein food at each meal. Protein foods include chicken, fish, meat, eggs, or beans (note that beans contain carbohydrate). These two food groups (non-starchy vegetables and  proteins) are low in carbohydrate. If you fill up your plate with these foods, you will eat less carbohydrate but still fill up your stomach. Try to limit your carbohydrate portion to  of the plate.  What fats are healthiest to eat?  Diabetes increases risk for heart disease. To help protect your heart, eat more healthy fats, such as olive oil, nuts, and avocado. Eat less saturated fats like butter, cream, and high-fat meats, like bacon and sausage. Avoid trans fats, which are in all foods that list "partially hydrogenated oil" as an ingredient. What should I drink?  Choose drinks that are not sweetened with sugar. The healthiest choices are water, carbonated or seltzer waters, and tea and coffee without added sugars.  Sweet drinks will make your blood glucose go up very quickly. One serving of soda or energy drink is  cup. It is best to drink these beverages only if your blood glucose is low.  Artificially sweetened, or diet drinks, typically do not increase your blood glucose if they have zero calories in them. Read labels of beverages, as some diet drinks do have carbohydrate and will raise your blood glucose. Label Reading Tips  Read Nutrition Facts labels to find out how many grams of carbohydrate are in a food you want to eat. Don't forget: sometimes serving sizes on the label aren't the same as how much food you are going to eat, so you may need to calculate how much carbohydrate is in the food you are serving yourself.   Carbohydrate Counting for People with Diabetes Sample 1-Day Menu  Breakfast  cup yogurt, low fat, low sugar (1 carbohydrate serving)   cup cereal, ready-to-eat, unsweetened (1 carbohydrate serving)  1 cup strawberries (1 carbohydrate serving)   cup almonds ( carbohydrate serving)  Lunch 1, 5 ounce can chunk light tuna  2 ounces cheese, low fat cheddar  6 whole wheat crackers (1 carbohydrate serving)  1 small apple (1 carbohydrate servings)   cup carrots (  carbohydrate serving)   cup snap peas  1 cup 1% milk (1 carbohydrate serving)   Evening Meal Stir fry made with: 3 ounces chicken  1 cup brown rice (3 carbohydrate servings)   cup broccoli ( carbohydrate serving)   cup green beans   cup onions  1 tablespoon olive oil  2 tablespoons teriyaki sauce ( carbohydrate serving)  Evening Snack 1 extra small banana (1 carbohydrate serving)  1 tablespoon peanut butter   Carbohydrate Counting for People with Diabetes Vegan Sample 1-Day Menu  Breakfast 1 cup cooked oatmeal (2 carbohydrate servings)   cup blueberries (1 carbohydrate serving)  2 tablespoons flaxseeds  1 cup soymilk fortified with calcium and vitamin D  1 cup coffee  Lunch 2 slices whole wheat bread (2 carbohydrate servings)   cup baked tofu   cup lettuce  2 slices tomato  2 slices avocado   cup baby carrots ( carbohydrate serving)  1 orange (1 carbohydrate serving)  1 cup soymilk fortified with calcium and vitamin D   Evening Meal Burrito made with: 1 6-inch corn tortilla (1 carbohydrate serving)  1 cup refried vegetarian beans (2 carbohydrate servings)   cup chopped tomatoes   cup lettuce   cup salsa  1/3 cup brown rice (1 carbohydrate serving)  1 tablespoon olive oil for rice   cup zucchini   Evening Snack 6 small whole grain crackers (1 carbohydrate serving)  2 apricots ( carbohydrate serving)   cup unsalted peanuts ( carbohydrate serving)    Carbohydrate Counting for People with Diabetes Vegetarian (Lacto-Ovo) Sample 1-Day Menu  Breakfast 1 cup cooked oatmeal (2 carbohydrate servings)   cup blueberries (1 carbohydrate serving)  2 tablespoons flaxseeds  1 egg  1 cup 1% milk (1 carbohydrate serving)  1 cup coffee  Lunch 2 slices whole wheat bread (2 carbohydrate servings)  2 ounces low-fat cheese   cup lettuce  2 slices tomato  2 slices avocado   cup baby carrots ( carbohydrate serving)  1 orange (1 carbohydrate serving)  1 cup  unsweetened tea  Evening Meal Burrito made with: 1 6-inch corn tortilla (1 carbohydrate serving)   cup refried vegetarian beans (1 carbohydrate serving)   cup tomatoes   cup lettuce   cup salsa  1/3 cup brown rice (1 carbohydrate serving)  1 tablespoon olive oil for rice   cup zucchini  1 cup 1% milk (1 carbohydrate serving)  Evening Snack 6 small whole grain crackers (1 carbohydrate serving)  2 apricots ( carbohydrate serving)   cup unsalted peanuts ( carbohydrate serving)    Copyright 2020  [employer]y [employer]. All rights reserved.  Using Nutrition Labels: Carbohydrate  . Serving  Size  . Look at the serving size. All the information on the label is based on this portion. Randol Kern Per Container  . The number of servings contained in the package. . Guidelines for Carbohydrate  . Look at the total grams of carbohydrate in the serving size.  . 1 carbohydrate choice = 15 grams of carbohydrate. Range of Carbohydrate Grams Per Choice  Carbohydrate Grams/Choice Carbohydrate Choices  6-10   11-20 1  21-25 1  26-35 2  36-40 2  41-50 3  51-55 3  56-65 4  66-70 4  71-80 5    Copyright 2020  [employer]y [employer]. All rights reserved.  Corrin Parker, MS, RD, LDN Clinical Dietitian Office Phone [phone]

## 2019-08-16 NOTE — Progress Notes (Addendum)
PROGRESS NOTE                                                                                                                                                                                                             Patient Demographics:    Kevin Long, is a 59 y.o. male, DOB - [birth year], ZOX:[id number]  Outpatient Primary MD for the patient is Fleet Contras, MD   Admit date - 08/11/2019   LOS - 4  Chief Complaint  Patient presents with  . Sepsis alert, Covid pos       Brief Narrative: Patient is a 59 y.o. male with PMHx of chronic systolic heart failure, HTN, DM-2-, OSA noncompliant to CPAP who presented with shortness of breath-found to have acute hypoxic respiratory failure secondary to PE, COVID-19 pneumonia, lung mass, decompensated heart failure and possible bacterial pneumonia.  Post admission-blood cultures positive for MSSA.  See below for further details.  Significant Events: 4/29>> COVID-19 positive at [facility] (results available in care everywhere) 5/13>> admit to [facility] for hypoxia-CT chest with lung mass, postobstructive pneumonia, groundglass opacities and PE. 5/17>> transitioned to room air  Significant studies 5/13>> CT chest: 7.7x8.2 masslike consolidation in the right upper lobe with surrounding groundglass/tree-in-bud nodularity reflecting postobstructive pneumonia, additional areas of multifocal/groundglass pneumonia, segmental/subsegmental PE in the right upper lobe. 5/14>> TTE: EF<20%, RV systolic function severely reduced 5/17>> lower extremity Doppler: Negative for DVT  COVID-19 medications: Steroids: 5/13>>5/18 Remdesivir: 5/13>>5/18  Antibiotics: Ancef: 5/14>> Unasyn: 5/14>>5/14 Zithromax: 5/13>>5/15 Ceftriaxone: 5/13 x 1  Microbiology data: 5/16: Blood culture>> no growth 5/13: Blood culture>> 1/2 MSSA  5/13: Urine culture>> no growth   DVT prophylaxis: Therapeutic Lovenox>> Eliquis  on 5/18  Procedures: None  Consults: None   Subjective:   No complaints-remains on room air.  No chest pain or shortness of breath.   Assessment  & Plan :   Acute Hypoxic Resp Failure due to Covid 19 Viral pneumonia, postobstructive pneumonia and pulmonary embolism: Significantly improved-hypoxia has resolved-he has been on room air for the past few days.  Treated with steroids/remdesivir/anticoagulation and antibiotics.  Will complete remdesivir today-since no longer hypoxic-we will stop steroids as well.   Fever: afebrile Incentive Spirometry: encouraged incentive spirometry use 3-4/hour. O2 requirements:  SpO2: 100 % O2 Flow Rate (L/min): 2 L/min   COVID-19 Labs: Recent Labs  08/14/19 0641 08/15/19 0422 08/16/19 0412  DDIMER 5.09* 3.86* 3.94*  FERRITIN 1,060* 712* 627*  CRP 18.6* 8.5* 4.7*       Component Value Date/Time   BNP 701.0 (H) 08/13/2019 1032    Recent Labs  Lab 08/11/19 1614 08/12/19 0922 08/13/19 1032 08/14/19 0641  PROCALCITON 0.73 6.16 5.22 5.06    No results found for: SARSCOV2NAA   MSSA bacteremia with possible septic emboli to the lung/lung abscess RUL: Remains on Ancef-repeat blood culture on 5/16 negative so far.  TTE on 5/14-no obvious vegetation.  Spoke with Dr. Macarthur Critchley will arrange for TEE coming Friday.  Acute on chronic systolic heart failure (EF 25-30% by TTE on 11/19/2016): Volume status much better-on Demadex and low-dose Coreg. Marland Kitchen  Per Dr. Macarthur Critchley has been noncompliant to medications and follow-up-and unfortunately he is not a candidate for device placement due to noncompliance.   Acute metabolic encephalopathy: Secondary to hypoxemia/bacteremia.  Encephalopathy has improved with treatment of the underlying etiologies.  Suspect he is not very far from his baseline.  AKI: Likely hemodynamically mediated in the setting of COVID-19-UA negative for proteinuria-improving with supportive care.  Watch closely while he is on diuretics.     Pulmonary embolism: Has been on Lovenox for the past several days-we will transition to Eliquis.  Lower extremity Doppler negative for DVT.  Lung mass: Images reviewed with Dr. Elige Radon Icard on 5/17-suspected consolidation likely secondary to MSSA bacteremia rather than a malignancy.  Does not advise that be proceed with a bronchoscopy right away, recommended that we treat with antimicrobial therapy for several weeks with plans to repeat CT chest in a month or so-before considering bronchoscopy.  Patient is ex-smoker-quit 2009. He is a ex-smoker-quit 2009.  Elevated troponin: Probably demand ischemia in the setting of PE/hypoxemia-recheck troponin levels later this afternoon.  EKG nonacute.  Outpatient cardiology note-last LHC in 2014 revealed normal coronary arteries.  HTN: Controlled-resuming low-dose beta-blocker today.  Bilateral foot pain: Ongoing for the past 1-2 months-apparently per patient-he stopped ambulating due to pain.  Pain is mostly in his bilateral toes and forefoot area-no obvious local swelling/erythema evident.  Uric acid levels on the higher side-however no significant response to steroids, plain x-rays without any acute abnormalities.  Started Neurontin on 5/17-feels some improvement-continue to optimize Neurontin dosage over the next few days.  Insulin-dependent DM-2 with uncontrolled hyperglycemia due to poor outpatient control worsened by steroids (A1c 12.4 on 08/12/2019): CBGs still on the higher side-but steroids are being discontinued today.  Continue Lantus 32 units, 6 units of NovoLog with meals and SSI.  Will follow and adjust.   Recent Labs    08/15/19 1609 08/15/19 2102 08/16/19 0752  GLUCAP 223* 147* 223*   OSA: Claims that his machine broke several months ago-we will try to place him on CPAP nightly if he tolerates here in the hospital.  Morbid Obesity: Estimated body mass index is 37.61 kg/m as calculated from the following:   Height as of this  encounter: 5\' 6"  (1.676 m).   Weight as of this encounter: 105.7 kg.    ABG:    Component Value Date/Time   PHART 7.484 (H) 08/12/2019 1023   PCO2ART 25.6 (L) 08/12/2019 1023   PO2ART 83.8 08/12/2019 1023   HCO3 19.1 (L) 08/12/2019 1023   TCO2 16 (L) 08/11/2019 2349   ACIDBASEDEF 3.9 (H) 08/12/2019 1023   O2SAT 97.6 08/12/2019 1023    Vent Settings: N/A   Condition - Stable  Family Communication  : Voicemail for  spouse ([phone]) on 5/18  Code Status : Full code    Diet :  Diet Order            Diet heart healthy/carb modified Room service appropriate? Yes; Fluid consistency: Thin; Fluid restriction: 1500 mL Fluid  Diet effective now               Disposition Plan  :   Status is: Inpatient  Remains inpatient appropriate because:Inpatient level of care appropriate due to severity of illness  Dispo: The patient is from: Home              Anticipated d/c is to: [facility]              Anticipated d/c date is: > 3 days              Patient currently is not medically stable to d/c.  Barriers to discharge:IV antimicrobial therapy-AKI-MSSA bacteremia-TEE scheduled for this coming Friday.  Antimicorbials  :    Anti-infectives (From admission, onward)   Start     Dose/Rate Route Frequency Ordered Stop   08/13/19 1000  remdesivir 100 mg in sodium chloride 0.9 % 100 mL IVPB     100 mg 200 mL/hr over 30 Minutes Intravenous Daily 08/11/19 2347 08/16/19 0843   08/13/19 0600  ceFAZolin (ANCEF) IVPB 2g/100 mL premix     2 g 200 mL/hr over 30 Minutes Intravenous Every 8 hours 08/12/19 2248     08/12/19 2200  ceFAZolin (ANCEF) IVPB 2g/100 mL premix     2 g 200 mL/hr over 30 Minutes Intravenous Every 8 hours 08/12/19 1840 08/12/19 2218   08/12/19 1800  azithromycin (ZITHROMAX) 500 mg in sodium chloride 0.9 % 250 mL IVPB  Status:  Discontinued     500 mg 250 mL/hr over 60 Minutes Intravenous Every 24 hours 08/12/19 0709 08/13/19 1223   08/12/19 0800  Ampicillin-Sulbactam  (UNASYN) 3 g in sodium chloride 0.9 % 100 mL IVPB  Status:  Discontinued     3 g 200 mL/hr over 30 Minutes Intravenous Every 6 hours 08/12/19 0730 08/12/19 1840   08/12/19 0030  remdesivir 200 mg in sodium chloride 0.9% 250 mL IVPB     200 mg 580 mL/hr over 30 Minutes Intravenous Once 08/11/19 2347 08/12/19 0219   08/11/19 1700  cefTRIAXone (ROCEPHIN) 1 g in sodium chloride 0.9 % 100 mL IVPB     1 g 200 mL/hr over 30 Minutes Intravenous  Once 08/11/19 1649 08/11/19 1756   08/11/19 1700  azithromycin (ZITHROMAX) 500 mg in sodium chloride 0.9 % 250 mL IVPB     500 mg 250 mL/hr over 60 Minutes Intravenous  Once 08/11/19 1649 08/11/19 2054      Inpatient Medications  Scheduled Meds: . carvedilol  3.125 mg Oral BID WC  . enoxaparin (LOVENOX) injection  1 mg/kg Subcutaneous Q12H  . gabapentin  100 mg Oral TID  . insulin aspart  0-20 Units Subcutaneous TID WC  . insulin aspart  0-5 Units Subcutaneous QHS  . insulin aspart  6 Units Subcutaneous TID WC  . insulin glargine  32 Units Subcutaneous QHS  . methylPREDNISolone (SOLU-MEDROL) injection  20 mg Intravenous Q12H  . pneumococcal 23 valent vaccine  0.5 mL Intramuscular Tomorrow-1000  . torsemide  30 mg Oral Daily   Continuous Infusions: .  ceFAZolin (ANCEF) IV 2 g (08/16/19 0856)   PRN Meds:.acetaminophen **OR** acetaminophen, albuterol, chlorpheniramine-HYDROcodone, guaiFENesin-dextromethorphan, ondansetron **OR** ondansetron (ZOFRAN) IV, polyethylene glycol   Time Spent in  minutes 25   See all Orders from today for further details   Jeoffrey Massed M.D on 08/16/2019 at 11:11 AM  To page go to [email or web address] - use universal password  [facility] -  Office  [phone]    Objective:   Vitals:   08/15/19 2000 08/16/19 0000 08/16/19 0400 08/16/19 0800  BP: 116/69 94/83 110/68 118/80  Pulse: 92 93 86 84  Resp: 16 20 18 20   Temp: 98.7 F (37.1 C) 98 F (36.7 C) 97.8 F (36.6 C) 98 F (36.7 C)  TempSrc: Oral Oral  Oral Oral  SpO2: 96% 91% 100% 100%  Weight:   105.7 kg   Height:        Wt Readings from Last 3 Encounters:  08/16/19 105.7 kg  06/22/19 118.7 kg  12/02/16 124.4 kg     Intake/Output Summary (Last 24 hours) at 08/16/2019 1111 Last data filed at 08/16/2019 1041 Gross per 24 hour  Intake 1200 ml  Output 3475 ml  Net -2275 ml     Physical Exam Gen Exam:Alert awake-not in any distress HEENT:atraumatic, normocephalic Chest: B/L clear to auscultation anteriorly CVS:S1S2 regular Abdomen:soft non tender, non distended Extremities:trace edema Neurology: Non focal Skin: no rash   Data Review:    CBC Recent Labs  Lab 08/12/19 0922 08/13/19 1032 08/14/19 0641 08/15/19 0422 08/16/19 0412  WBC 14.7* 12.8* 13.2* 10.0 12.3*  HGB 15.3 14.8 14.4 13.8 14.2  HCT 48.4 48.7 45.7 42.9 43.8  PLT 297 375 400 392 420*  MCV 85.1 87.9 84.9 83.5 81.9  MCH 26.9 26.7 26.8 26.8 26.5  MCHC 31.6 30.4 31.5 32.2 32.4  RDW 16.3* 16.3* 15.9* 15.5 15.2  LYMPHSABS 1.2 0.8 1.0 0.9 1.0  MONOABS 0.6 0.7 0.7 0.6 0.6  EOSABS 0.0 0.0 0.0 0.0 0.0  BASOSABS 0.0 0.0 0.0 0.0 0.0    Chemistries  Recent Labs  Lab 08/12/19 0922 08/13/19 1032 08/14/19 0641 08/15/19 0422 08/16/19 0412  NA 140 142 137 134* 135  K 4.5 5.1 5.0 3.8 3.5  CL 107 107 99 97* 100  CO2 19* 17* 25 26 24   GLUCOSE 157* 221* 87 345* 243*  BUN 14 30* 25* 31* 35*  CREATININE 1.53* 1.83* 1.36* 1.57* 1.50*  CALCIUM 8.8* 8.6* 8.5* 8.2* 8.2*  MG 1.9 2.2 2.1  --   --   AST 24 81* 43* 26 24  ALT 18 27 20 15 10   ALKPHOS 63 75 93 58 58  BILITOT 1.6* 1.4* 1.6* 0.8 0.7   ------------------------------------------------------------------------------------------------------------------ No results for input(s): CHOL, HDL, LDLCALC, TRIG, CHOLHDL, LDLDIRECT in the last 72 hours.  Lab Results  Component Value Date   HGBA1C 12.4 (H) 08/12/2019    ------------------------------------------------------------------------------------------------------------------ No results for input(s): TSH, T4TOTAL, T3FREE, THYROIDAB in the last 72 hours.  Invalid input(s): FREET3 ------------------------------------------------------------------------------------------------------------------ Recent Labs    08/15/19 0422 08/16/19 0412  FERRITIN 712* 627*    Coagulation profile Recent Labs  Lab 08/11/19 1614 08/12/19 0826  INR 1.7* 2.2*    Recent Labs    08/15/19 0422 08/16/19 0412  DDIMER 3.86* 3.94*    Cardiac Enzymes No results for input(s): CKMB, TROPONINI, MYOGLOBIN in the last 168 hours.  Invalid input(s): CK ------------------------------------------------------------------------------------------------------------------    Component Value Date/Time   BNP 701.0 (H) 08/13/2019 1032    Micro Results Recent Results (from the past 240 hour(s))  Blood Culture (routine x 2)     Status: Abnormal   Collection Time: 08/11/19  3:58 PM   Specimen: BLOOD RIGHT  ARM  Result Value Ref Range Status   Specimen Description BLOOD RIGHT ARM  Final   Special Requests   Final    BOTTLES DRAWN AEROBIC AND ANAEROBIC Blood Culture adequate volume   Culture  Setup Time   Final    GRAM POSITIVE COCCI IN CLUSTERS ANAEROBIC BOTTLE ONLY CRITICAL RESULT CALLED TO, READ BACK BY AND VERIFIED WITHDion Body PHARMD 1826 08/12/19 A BROWNING Performed at [facility], [place] [address] [place], [place] [address]    Culture STAPHYLOCOCCUS AUREUS (A)  Final   Report Status 08/14/2019 FINAL  Final   Organism ID, Bacteria STAPHYLOCOCCUS AUREUS  Final      Susceptibility   Staphylococcus aureus - MIC*    CIPROFLOXACIN <=0.5 SENSITIVE Sensitive     ERYTHROMYCIN >=8 RESISTANT Resistant     GENTAMICIN <=0.5 SENSITIVE Sensitive     OXACILLIN 0.5 SENSITIVE Sensitive     TETRACYCLINE <=1 SENSITIVE Sensitive     VANCOMYCIN 1 SENSITIVE Sensitive      TRIMETH/SULFA <=10 SENSITIVE Sensitive     CLINDAMYCIN <=0.25 SENSITIVE Sensitive     RIFAMPIN <=0.5 SENSITIVE Sensitive     Inducible Clindamycin NEGATIVE Sensitive     * STAPHYLOCOCCUS AUREUS  Blood Culture ID Panel (Reflexed)     Status: Abnormal   Collection Time: 08/11/19  3:58 PM  Result Value Ref Range Status   Enterococcus species NOT DETECTED NOT DETECTED Final   Listeria monocytogenes NOT DETECTED NOT DETECTED Final   Staphylococcus species DETECTED (A) NOT DETECTED Final    Comment: CRITICAL RESULT CALLED TO, READ BACK BY AND VERIFIED WITHDion Body PHARMD 1826 08/12/19 A BROWNING    Staphylococcus aureus (BCID) DETECTED (A) NOT DETECTED Final    Comment: Methicillin (oxacillin) susceptible Staphylococcus aureus (MSSA). Preferred therapy is anti staphylococcal beta lactam antibiotic (Cefazolin or Nafcillin), unless clinically contraindicated. CRITICAL RESULT CALLED TO, READ BACK BY AND VERIFIED WITH: Dion Body PHARMD 1826 08/12/19 A BROWNING    Methicillin resistance NOT DETECTED NOT DETECTED Final   Streptococcus species NOT DETECTED NOT DETECTED Final   Streptococcus agalactiae NOT DETECTED NOT DETECTED Final   Streptococcus pneumoniae NOT DETECTED NOT DETECTED Final   Streptococcus pyogenes NOT DETECTED NOT DETECTED Final   Acinetobacter baumannii NOT DETECTED NOT DETECTED Final   Enterobacteriaceae species NOT DETECTED NOT DETECTED Final   Enterobacter cloacae complex NOT DETECTED NOT DETECTED Final   Escherichia coli NOT DETECTED NOT DETECTED Final   Klebsiella oxytoca NOT DETECTED NOT DETECTED Final   Klebsiella pneumoniae NOT DETECTED NOT DETECTED Final   Proteus species NOT DETECTED NOT DETECTED Final   Serratia marcescens NOT DETECTED NOT DETECTED Final   Haemophilus influenzae NOT DETECTED NOT DETECTED Final   Neisseria meningitidis NOT DETECTED NOT DETECTED Final   Pseudomonas aeruginosa NOT DETECTED NOT DETECTED Final   Candida albicans NOT DETECTED NOT DETECTED  Final   Candida glabrata NOT DETECTED NOT DETECTED Final   Candida krusei NOT DETECTED NOT DETECTED Final   Candida parapsilosis NOT DETECTED NOT DETECTED Final   Candida tropicalis NOT DETECTED NOT DETECTED Final    Comment: Performed at [facility], [place] [address], [place], [place] [address]  Urine culture     Status: None   Collection Time: 08/11/19  9:06 PM   Specimen: In/Out Cath Urine  Result Value Ref Range Status   Specimen Description IN/OUT CATH URINE  Final   Special Requests NONE  Final   Culture   Final    NO  GROWTH Performed at [facility], [address], [place], [place] [address]    Report Status 08/14/2019 FINAL  Final  Culture, blood (routine x 2)     Status: None (Preliminary result)   Collection Time: 08/14/19  6:41 AM   Specimen: BLOOD  Result Value Ref Range Status   Specimen Description BLOOD RIGHT ANTECUBITAL  Final   Special Requests   Final    BOTTLES DRAWN AEROBIC AND ANAEROBIC Blood Culture results may not be optimal due to an inadequate volume of blood received in culture bottles   Culture   Final    NO GROWTH 2 DAYS Performed at [facility], [address] [place], [place] [address]    Report Status PENDING  Incomplete  Culture, blood (routine x 2)     Status: None (Preliminary result)   Collection Time: 08/14/19  6:41 AM   Specimen: BLOOD RIGHT HAND  Result Value Ref Range Status   Specimen Description BLOOD RIGHT HAND  Final   Special Requests   Final    BOTTLES DRAWN AEROBIC AND ANAEROBIC Blood Culture results may not be optimal due to an inadequate volume of blood received in culture bottles   Culture   Final    NO GROWTH 2 DAYS Performed at [facility], [address] [place], [place] [address]    Report Status PENDING  Incomplete    Radiology Reports CT Angio Chest PE W/Cm &/Or Wo Cm  Result Date: 08/11/2019 CLINICAL DATA:  Shortness of breath, hypoxia, hemoptysis, recent COVID-19 diagnosis 6 days prior  EXAM: CT ANGIOGRAPHY CHEST WITH CONTRAST TECHNIQUE: Multidetector CT imaging of the chest was performed using the standard protocol during bolus administration of intravenous contrast. Multiplanar CT image reconstructions and MIPs were obtained to evaluate the vascular anatomy. CONTRAST:  OMNIPAQUE IOHEXOL 350 MG/ML SOLN COMPARISON:  Same-day radiograph FINDINGS: Cardiovascular: Satisfactory opacification the pulmonary arteries to the segmental level. Evaluation beyond the lobar level is limited due to extensive respiratory motion artifact. However, there is suspected filling defect within the segmental and subsegmental pulmonary arteries of the posterior right lower lobe extending to the ostium of the right upper lobar pulmonary artery. Finding is best seen on coronal MIP imaging series 14, image [id number]. Several of the pulmonary arteries in the region of the superior segment right lower lobe and right middle lobe appear narrowed as the past within the perihilar density detailed below. Central pulmonary arteries are normal caliber. Slight reflux of contrast into the IVC and hepatic veins. Without other convincing CT evidence of right heart strain. Borderline cardiomegaly without pericardial effusion. Atherosclerotic plaque within the normal caliber aorta. Shared origin of the left common carotid and brachiocephalic artery. Minimal plaque in the proximal great vessels. Mediastinum/Nodes: Multiple subcentimeter mediastinal and hilar nodes are present. More confluent soft tissue opacity extending within the subcarinal space in into the right hilum (9/44-48). Mild focal narrowing of the right mainstem bronchus and bronchus intermedius in this region of soft tissue attenuation. Possibly reflecting conglomerate nodal tissue versus direct extension of a mass. Tiny diverticulum seen arising from the inferomedial aspect of the left mainstem bronchus. No other acute abnormality of the central airways. Difficult to  discern the margins of the distal thoracic esophagus from the soft tissue in the subcarinal region. Thyroid gland and thoracic inlet are unremarkable. Lungs/Pleura: Dense masslike consolidation measuring approximately 7.7 x 8.2 cm (11/35) is seen in the posterior segment right upper lobe abutting the major fissure with confluent  soft tissue attenuation extending into the right hilum and subcarinal region which is highly worrisome for an underlying mass lesion particularly given narrowing of the airways and pulmonary arteries in this location. However, there are additional peripheral tree-in-bud opacities and multifocal mixed ground-glass and consolidation elsewhere throughout the lungs. No visible pneumothorax or effusion. Background of centrilobular emphysema is noted towards the apices. Upper Abdomen: No acute abnormalities present in the visualized portions of the upper abdomen. Musculoskeletal: No acute osseous abnormality or suspicious osseous lesion. Multilevel degenerative changes are present in the imaged portions of the spine. Additional degenerative changes at the left sternoclavicular joint and bilateral shoulders. Review of the MIP images confirms the above findings. IMPRESSION: 1. Segmental and subsegmental pulmonary artery emboli in the pulmonary arteries of the posterior segment right upper lobe extending towards the ostial origin from the left upper lobar pulmonary artery. No CT evidence of right heart strain. 2. Dense masslike consolidation measuring approximately 7.7 x 8.2 cm in the posterior segment right upper lobe abutting the major fissure with confluent soft tissue attenuation extending into the right hilum and subcarinal region which is highly worrisome for an underlying mass lesion particularly given narrowing of the airways and pulmonary arteries in this location. While dense infectious consolidation or large pulmonary infarct could have a similar appearance high suspicion should be  maintained with continued follow-up imaging following acute intervention. Surrounding areas of ground-glass and tree-in-bud nodularity could reflect some postobstructive pneumonia given truncation the airways entering this region. 3. Additional multifocal areas of mixed consolidation and ground-glass opacity compatible with atypical pneumonia in the setting of known COVID-19 positivity. 4. Additional subcentimeter mediastinal and hilar nodes, likely reactive though metastatic disease is not excluded. 5. Aortic Atherosclerosis (ICD10-I70.0). These results were called by telephone at the time of interpretation on 08/11/2019 at 10:48 pm to provider GARRETT GREEN , who verbally acknowledged these results. Electronically Signed   By: Kreg Shropshire M.D.   On: 08/11/2019 22:49   DG Chest Port 1 View  Result Date: 08/11/2019 CLINICAL DATA:  Fall shortness of breath, hypoxia, COVID-19 positive EXAM: PORTABLE CHEST 1 VIEW COMPARISON:  Radiograph 10/24/2013 FINDINGS: Focal opacity is seen in the right mid lung, likely in the upper lobe. More mixed patchy and interstitial opacities are seen in the lung bases and periphery. No pneumothorax or visible effusion. Mild cardiomegaly though possibly accentuated by portable technique. Remaining cardiomediastinal contours are unremarkable. No acute osseous or soft tissue abnormality. Telemetry leads overlie the chest. IMPRESSION: 1. Focal opacity in the right mid lung, likely in the upper lobe, suspicious for pneumonia. 2. Additional patchy and interstitial opacities in the lung bases and periphery may reflect additional areas of infection in the setting of COVID-19. Electronically Signed   By: Kreg Shropshire M.D.   On: 08/11/2019 15:38   DG Foot Complete Left  Result Date: 08/14/2019 CLINICAL DATA:  Left foot pain, bacteremia EXAM: LEFT FOOT - COMPLETE 3+ VIEW COMPARISON:  None. FINDINGS: No acute fracture. No dislocation. Mild first MTP joint space narrowing. Well defined marginal  lucencies with sclerotic margins at the medial aspect of the left first metatarsal head, which can be seen in the setting of a crystalline arthropathy such as gout. Small plantar calcaneal spur. There is soft tissue swelling over the dorsum of the forefoot. IMPRESSION: 1. Soft tissue swelling over the dorsum of the forefoot without underlying fracture or dislocation. 2. Small well defined marginal lucencies at the medial aspect of the left first metatarsal head, which may reflect  sequela of gout. Electronically Signed   By: Duanne Guess D.O.   On: 08/14/2019 17:10   DG Foot Complete Right  Result Date: 08/14/2019 CLINICAL DATA:  Bilateral foot pain for 2 months. Bacteremia EXAM: RIGHT FOOT COMPLETE - 3+ VIEW COMPARISON:  04/20/2008 FINDINGS: Postsurgical changes related to ORIF of the first through fourth tarsometatarsal joints. Hardware appears intact without perihardware lucency or fracture. 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 No acute osseous abnormality. No erosion or cortical destruction. Soft tissues are within normal limits. IMPRESSION: 1. No acute osseous abnormality. 2. Postsurgical changes related to ORIF of the first through fourth tarsometatarsal joints. No evidence for hardware complication. Electronically Signed   By: Duanne Guess D.O.   On: 08/14/2019 17:08   ECHOCARDIOGRAM COMPLETE  Result Date: 08/12/2019    ECHOCARDIOGRAM REPORT   Patient Name:   Kevin Long Date of Exam: 08/12/2019 Medical Rec #:  [id number]     Height:       66.0 in Accession #:    [id number]    Weight:       232.8 lb Date of Birth:  Aug 30, 1960     BSA:          2.133 m Patient Age:    58 years      BP:           90/75 mmHg Patient Gender: M             HR:           98 bpm. Exam Location:  Inpatient Procedure: 2D Echo Indications:    415.19 pulmonary embolus  History:        Patient has prior history of Echocardiogram examinations, most                 recent 06/26/2019. Cardiomyopathy; Risk  Factors:Hypertension,                 Diabetes, Dyslipidemia and Former Smoker.  Sonographer:    Celene Skeen RDCS (AE) Referring Phys: [id number] Deno Lunger [facility]  Sonographer Comments: Suboptimal apical window. Image acquisition challenging due to patient body habitus and Image acquisition challenging due to respiratory motion. very limited mobility IMPRESSIONS  1. Left ventricular ejection fraction, by estimation, is <20%. The left ventricle has severely decreased function. The left ventricle demonstrates global hypokinesis. The left ventricular internal cavity size was moderately dilated. Left ventricular diastolic function could not be evaluated.  2. Right ventricular systolic function is severely reduced. The right ventricular size is moderately enlarged. Tricuspid regurgitation signal is inadequate for assessing PA pressure.  3. Left atrial size was mild to moderately dilated.  4. Right atrial size was mildly dilated.  5. The mitral valve is degenerative. Trivial mitral valve regurgitation. No evidence of mitral stenosis.  6. The aortic valve is tricuspid. Aortic valve regurgitation is not visualized. Mild aortic valve sclerosis is present, with no evidence of aortic valve stenosis.  7. The inferior vena cava is dilated in size with <50% respiratory variability, suggesting right atrial pressure of 15 mmHg. Conclusion(s)/Recommendation(s): Probable moderate sized thrombus in LV apex. Recommend definity contrast to further evaulated extent. FINDINGS  Left Ventricle: Left ventricular ejection fraction, by estimation, is <20%. The left ventricle has severely decreased function. The left ventricle demonstrates global hypokinesis. The left ventricular internal cavity size was moderately dilated. There is no left ventricular hypertrophy. Left ventricular diastolic function could not be evaluated. Right Ventricle: The right ventricular size is moderately enlarged. No increase in right  ventricular wall thickness. Right  ventricular systolic function is severely reduced. Tricuspid regurgitation signal is inadequate for assessing PA pressure. Left Atrium: Left atrial size was mild to moderately dilated. Right Atrium: Right atrial size was mildly dilated. Pericardium: There is no evidence of pericardial effusion. Mitral Valve: The mitral valve is degenerative in appearance. There is mild thickening of the mitral valve leaflet(s). Normal mobility of the mitral valve leaflets. Mild to moderate mitral annular calcification. Trivial mitral valve regurgitation. No evidence of mitral valve stenosis. Tricuspid Valve: The tricuspid valve is normal in structure. Tricuspid valve regurgitation is trivial. No evidence of tricuspid stenosis. Aortic Valve: The aortic valve is tricuspid. Aortic valve regurgitation is not visualized. Mild aortic valve sclerosis is present, with no evidence of aortic valve stenosis. Pulmonic Valve: The pulmonic valve was not well visualized. Pulmonic valve regurgitation is trivial. No evidence of pulmonic stenosis. Aorta: The aortic root is normal in size and structure. Venous: The inferior vena cava is dilated in size with less than 50% respiratory variability, suggesting right atrial pressure of 15 mmHg. IAS/Shunts: No atrial level shunt detected by color flow Doppler.  LEFT VENTRICLE PLAX 2D LVIDd:         6.10 cm LVIDs:         5.60 cm LV PW:         1.20 cm LV IVS:        0.90 cm LVOT diam:     2.30 cm LV SV:         38 LV SV Index:   18 LVOT Area:     4.15 cm  RIGHT VENTRICLE TAPSE (M-mode): 1.1 cm LEFT ATRIUM           Index       RIGHT ATRIUM           Index LA diam:      3.50 cm 1.64 cm/m  RA Area:     19.40 cm LA Vol (A2C): 92.6 ml 43.41 ml/m RA Volume:   58.70 ml  27.52 ml/m LA Vol (A4C): 21.9 ml 10.27 ml/m  AORTIC VALVE LVOT Vmax:   62.70 cm/s LVOT Vmean:  46.100 cm/s LVOT VTI:    0.090 m  AORTA Ao Root diam: 3.60 cm  SHUNTS Systemic VTI:  0.09 m Systemic Diam: 2.30 cm Armanda Magic MD Electronically  signed by Armanda Magic MD Signature Date/Time: 08/12/2019/2:29:09 PM    Final    VAS [place] LOWER EXTREMITY VENOUS (DVT)  Result Date: 08/15/2019  Lower Venous DVTStudy Indications: COVID-19 positive, Pain, and Edema.  Comparison Study: No prior study Performing Technologist: Gertie Fey MHA, RDMS, RVT, RDCS  Examination Guidelines: A complete evaluation includes B-mode imaging, spectral Doppler, color Doppler, and power Doppler as needed of all accessible portions of each vessel. Bilateral testing is considered an integral part of a complete examination. Limited examinations for reoccurring indications may be performed as noted. The reflux portion of the exam is performed with the patient in reverse Trendelenburg.  +---------+---------------+---------+-----------+----------+--------------+ RIGHT    CompressibilityPhasicitySpontaneityPropertiesThrombus Aging +---------+---------------+---------+-----------+----------+--------------+ CFV      Full           Yes      Yes                                 +---------+---------------+---------+-----------+----------+--------------+ SFJ      Full                                                        +---------+---------------+---------+-----------+----------+--------------+  FV Prox  Full                                                        +---------+---------------+---------+-----------+----------+--------------+ FV Mid   Full                                                        +---------+---------------+---------+-----------+----------+--------------+ FV DistalFull                                                        +---------+---------------+---------+-----------+----------+--------------+ PFV      Full                                                        +---------+---------------+---------+-----------+----------+--------------+ POP      Full           Yes      Yes                                  +---------+---------------+---------+-----------+----------+--------------+ PTV      Full                                                        +---------+---------------+---------+-----------+----------+--------------+ PERO     Full                                                        +---------+---------------+---------+-----------+----------+--------------+   +---------+---------------+---------+-----------+----------+--------------+ LEFT     CompressibilityPhasicitySpontaneityPropertiesThrombus Aging +---------+---------------+---------+-----------+----------+--------------+ CFV      Full           Yes      Yes                                 +---------+---------------+---------+-----------+----------+--------------+ SFJ      Full                                                        +---------+---------------+---------+-----------+----------+--------------+ FV Prox  Full                                                        +---------+---------------+---------+-----------+----------+--------------+  FV Mid   Full                                                        +---------+---------------+---------+-----------+----------+--------------+ FV DistalFull                                                        +---------+---------------+---------+-----------+----------+--------------+ PFV      Full                                                        +---------+---------------+---------+-----------+----------+--------------+ POP      Full           Yes      Yes                                 +---------+---------------+---------+-----------+----------+--------------+ PTV      Full                                                        +---------+---------------+---------+-----------+----------+--------------+ PERO     Full                                                         +---------+---------------+---------+-----------+----------+--------------+     Summary: RIGHT: - There is no evidence of deep vein thrombosis in the lower extremity.  - No cystic structure found in the popliteal fossa.  LEFT: - There is no evidence of deep vein thrombosis in the lower extremity.  - No cystic structure found in the popliteal fossa.  *See table(s) above for measurements and observations. Electronically signed by Sherald Hess MD on 08/15/2019 at 5:46:23 PM.    Final    [place] EKG SITE RITE  Result Date: 08/13/2019 If Site Rite image not attached, placement could not be confirmed due to current cardiac rhythm.  US Abdomen Limited RUQ  Result Date: 08/12/2019 CLINICAL DATA:  Abnormal LFTs EXAM: ULTRASOUND ABDOMEN LIMITED RIGHT UPPER QUADRANT COMPARISON:  None. FINDINGS: Gallbladder: The gallbladder appears to be filled with slightly a hyperechoic sludge. No sonographic Murphy sign. The gallbladder wall thickness measures 3 mm. No pericholecystic fluid. Common bile duct: Diameter: 5 mm Liver: No focal lesion identified. Within normal limits in parenchymal echogenicity. Portal vein is patent on color Doppler imaging with normal direction of blood flow towards the liver. Other: None. IMPRESSION: Gallbladder sludge.  No definite evidence of cholecystitis. Electronically Signed   By: Jonna Clark M.D.   On: 08/12/2019 01:58

## 2019-08-16 NOTE — Care Management (Signed)
Pt has Medicaid.  Eliquis is on the [facility] Medicaid preferred drug list per latest publication via web.

## 2019-08-16 NOTE — Progress Notes (Signed)
ANTICOAGULATION CONSULT NOTE - Initial Consult  Pharmacy Consult for HEPARIN Indication: pulmonary embolus in setting of Covid PNA  Allergies  Allergen Reactions  . Celebrex [Celecoxib] Swelling    Swelling in face  . Entresto [Sacubitril-Valsartan] Other (See Comments)    Dizziness    Patient Measurements: Height: 5\' 6"  (167.6 cm) Weight: 105.7 kg (233 lb 0.4 oz) IBW/kg (Calculated) : 63.8 Heparin Dosing Weight: 90kg  Vital Signs: Temp: 97.8 F (36.6 C) (05/18 1100) Temp Source: Oral (05/18 1100) BP: 103/88 (05/18 1100) Pulse Rate: 89 (05/18 1100)  Labs: Recent Labs    08/14/19 0641 08/14/19 0641 08/15/19 0422 08/16/19 0412  HGB 14.4   < > 13.8 14.2  HCT 45.7  --  42.9 43.8  PLT 400  --  392 420*  HEPARINUNFRC 0.96*  --   --   --   CREATININE 1.36*  --  1.57* 1.50*   < > = values in this interval not displayed.    Estimated Creatinine Clearance: 59.7 mL/min (A) (by C-G formula based on SCr of 1.5 mg/dL (H)).   Medical History: Past Medical History:  Diagnosis Date  . Asthma   . Diabetes mellitus, type 2 (HCC)   . Hypercholesterolemia   . Hypertension   . Obesity   . OSA (obstructive sleep apnea)   . Other primary cardiomyopathies     Assessment: 59 yo male tested positive for Covid-19. Completed 5 days of  Remdesivir today 08/16/19.   Pharmacy consulted today 5/18 to transition from treatment dose to Eliquis for RUL PE.  5/13 CT chest: 7.7x8.2 masslike consolidation RUL. 5/17>> lower extremity Doppler: Negative for DVT  LMWH 1mg /kg q12h , last dose given 5/18 @ 00:29,  will discontinue now and start Eliquis when next lovenox injection would have been due at 12:30. Transtion to Eliquis 10mg  bid x 7 days then on 08/23/19 reduce to 5mg  BID  Scr 1.50,CrCl ~60 ml/mn.  CBC wnl , pltc 392>420k,  No bleeding reported.   Goal of Therapy: Monitor platelets by anticoagulation protocol: Yes   Plan:  Discontinue Lovenox now Start today, Eliquis 10mg  bid x 7  days 5/18>5/24, then on 5/25 reduce to 5 mg bid Monitor for s/sx of bleeding Will educate patient prior to discharge Plan [facility] pharmacy to fill  RX for Elquis VTE treatment packet on discharge (30 day free card) CM noted pt has Medicaid and Eliquis is on the [facility] Medicaid preferred drug list    Thank you for allowing pharmacy to be part of this patients care team.   , RPh Clinical Pharmacist [phone] Please check AMION for all [facility] Pharmacy phone numbers After 10:00 PM, call Main Pharmacy [phone]  08/16/2019,12:00 PM

## 2019-08-17 DIAGNOSIS — R918 Other nonspecific abnormal finding of lung field: Secondary | ICD-10-CM

## 2019-08-17 LAB — CBC
HCT: 47.8 % (ref 39.0–52.0)
Hemoglobin: 15.2 g/dL (ref 13.0–17.0)
MCH: 26.7 pg (ref 26.0–34.0)
MCHC: 31.8 g/dL (ref 30.0–36.0)
MCV: 84 fL (ref 80.0–100.0)
Platelets: 428 10*3/uL — ABNORMAL HIGH (ref 150–400)
RBC: 5.69 MIL/uL (ref 4.22–5.81)
RDW: 15.3 % (ref 11.5–15.5)
WBC: 13.8 10*3/uL — ABNORMAL HIGH (ref 4.0–10.5)
nRBC: 0 % (ref 0.0–0.2)

## 2019-08-17 LAB — GLUCOSE, CAPILLARY
Glucose-Capillary: 207 mg/dL — ABNORMAL HIGH (ref 70–99)
Glucose-Capillary: 211 mg/dL — ABNORMAL HIGH (ref 70–99)
Glucose-Capillary: 114 mg/dL — ABNORMAL HIGH (ref 70–99)

## 2019-08-17 LAB — COMPREHENSIVE METABOLIC PANEL
ALT: 8 U/L (ref 0–44)
AST: 28 U/L (ref 15–41)
Albumin: 2.2 g/dL — ABNORMAL LOW (ref 3.5–5.0)
Alkaline Phosphatase: 65 U/L (ref 38–126)
Anion gap: 15 (ref 5–15)
BUN: 31 mg/dL — ABNORMAL HIGH (ref 6–20)
CO2: 22 mmol/L (ref 22–32)
Calcium: 8.1 mg/dL — ABNORMAL LOW (ref 8.9–10.3)
Chloride: 95 mmol/L — ABNORMAL LOW (ref 98–111)
Creatinine, Ser: 1.68 mg/dL — ABNORMAL HIGH (ref 0.61–1.24)
GFR calc Af Amer: 50 mL/min — ABNORMAL LOW (ref >60)
GFR calc non Af Amer: 43 mL/min — ABNORMAL LOW (ref >60)
Glucose, Bld: 272 mg/dL — ABNORMAL HIGH (ref 70–99)
Potassium: 4 mmol/L (ref 3.5–5.1)
Sodium: 132 mmol/L — ABNORMAL LOW (ref 135–145)
Total Bilirubin: 0.8 mg/dL (ref 0.3–1.2)
Total Protein: 6.1 g/dL — ABNORMAL LOW (ref 6.5–8.1)

## 2019-08-17 LAB — MAGNESIUM: Magnesium: 2 mg/dL (ref 1.7–2.4)

## 2019-08-17 LAB — C-REACTIVE PROTEIN: CRP: 1.9 mg/dL — ABNORMAL HIGH (ref <1.0)

## 2019-08-17 NOTE — Progress Notes (Signed)
Patient refused to take oral gabapentin and Eliquis. Patient states" so many medicine huts my belly." This Rn explained about benefit and quiescences not taking the medicine. Patient continue denies to take that medicine. Will continue monitor.

## 2019-08-17 NOTE — Progress Notes (Signed)
Inpatient Diabetes Program Recommendations  AACE/ADA: New Consensus Statement on Inpatient Glycemic Control (2015)  Target Ranges:  Prepandial:   less than 140 mg/dL      Peak postprandial:   less than 180 mg/dL (1-2 hours)      Critically ill patients:  140 - 180 mg/dL   Lab Results  Component Value Date   GLUCAP 211 (H) 08/17/2019   HGBA1C 12.4 (H) 08/12/2019    Review of Glycemic Control  Results for MONTFORD, BARG (MRN [id number]) as of 08/17/2019 11:43  Ref. Range 08/16/2019 11:37 08/16/2019 16:10 08/16/2019 20:56 08/17/2019 07:47 08/17/2019 11:34  Glucose-Capillary Latest Ref Range: 70 - 99 mg/dL 284 (H) 132 (H) 440 (H) 207 (H) 211 (H)   Diabetes history:  DM2   Current orders for Inpatient glycemic control:  Lantus 32 units daily Novolog 0-20 units TID + 0-5 units QHS Novolog 6 units meal coverage TID   Inpatient Diabetes Program Recommendations:    Novolog 8 units TID with meals  Lantus 36 units daily  Thank you, Dulce Sellar, RN, BSN Diabetes Coordinator Inpatient Diabetes Program [phone] (team pager from 8a-5p)

## 2019-08-17 NOTE — Progress Notes (Signed)
PROGRESS NOTE                                                                                                                                                                                                             Patient Demographics:    Kevin Long, is a 59 y.o. male, DOB - 07/07/1960, ZOX:[id number]  Outpatient Primary MD for the patient is Fleet Contras, MD   Admit date - 08/11/2019   LOS - 5  Chief Complaint  Patient presents with  . Sepsis alert, Covid pos       Brief Narrative: Patient is a 60 y.o. male with PMHx of chronic systolic heart failure, HTN, DM-2-, OSA noncompliant to CPAP who presented with shortness of breath-found to have acute hypoxic respiratory failure secondary to PE, COVID-19 pneumonia, lung mass, decompensated heart failure and possible bacterial pneumonia.  Post admission-blood cultures positive for MSSA.  See below for further details.  Significant Events: 4/29>> COVID-19 positive at [facility] (results available in care everywhere) 5/13>> admit to [facility] for hypoxia-CT chest with lung mass, postobstructive pneumonia, groundglass opacities and PE. 5/17>> transitioned to room air  Significant studies 5/13>> CT chest: 7.7x8.2 masslike consolidation in the right upper lobe with surrounding groundglass/tree-in-bud nodularity reflecting postobstructive pneumonia, additional areas of multifocal/groundglass pneumonia, segmental/subsegmental PE in the right upper lobe. 5/14>> TTE: EF<20%, RV systolic function severely reduced 5/17>> lower extremity Doppler: Negative for DVT  COVID-19 medications: Steroids: 5/13>>5/18 Remdesivir: 5/13>>5/18  Antibiotics: Ancef: 5/14>> Unasyn: 5/14>>5/14 Zithromax: 5/13>>5/15 Ceftriaxone: 5/13 x 1  Microbiology data: 5/16: Blood culture>> no growth 5/13: Blood culture>> 1/2 MSSA  5/13: Urine culture>> no growth   DVT prophylaxis: Therapeutic Lovenox>> Eliquis  on 5/18  Procedures: None  Consults: None   Subjective:   Awake-relatively alert-needs some redirection at times.  On room air-follows commands.  Claims that the pain in his bilateral foot is somewhat better.   Assessment  & Plan :   Acute Hypoxic Resp Failure due to Covid 19 Viral pneumonia, postobstructive pneumonia and pulmonary embolism: Significantly improved-hypoxia has resolved-he has been on room air for the past few days.  Treated with steroids/remdesivir/anticoagulation and antibiotics.  Has completed a course of steroids/remdesivir-remains on anticoagulation and Ancef.   Fever: afebrile Incentive Spirometry: encouraged incentive spirometry use 3-4/hour. O2 requirements:  SpO2: 93 % O2 Flow Rate (L/min): 2 L/min  COVID-19 Labs: Recent Labs    08/15/19 0422 08/16/19 0412 08/17/19 0950  DDIMER 3.86* 3.94*  --   FERRITIN 712* 627*  --   CRP 8.5* 4.7* 1.9*       Component Value Date/Time   BNP 701.0 (H) 08/13/2019 1032    Recent Labs  Lab 08/11/19 1614 08/12/19 0922 08/13/19 1032 08/14/19 0641  PROCALCITON 0.73 6.16 5.22 5.06    No results found for: SARSCOV2NAA   MSSA bacteremia with possible septic emboli to the lung/lung abscess RUL: Remains on Ancef-repeat blood culture on 5/16 negative so far.  TTE on 5/14-no obvious vegetation.  Spoke with Dr. Macarthur Critchley will arrange for TEE coming Friday.  Acute on chronic systolic heart failure (EF 25-30% by TTE on 11/19/2016): Volume status is stable-due to mild AKI-we will go and stop diuretics today.  Continue low-dose Coreg.  Per Dr. Macarthur Critchley has been noncompliant to medications and follow-up-and unfortunately he is not a candidate for device placement due to noncompliance.   Acute metabolic encephalopathy: Secondary to hypoxemia/bacteremia.  Encephalopathy has improved with treatment of the underlying etiologies.  Suspect he is not very far from his baseline.  AKI: Likely hemodynamically mediated in the setting  of COVID-19-UA negative for proteinuria-continues to have mild AKI-slight bump in creatinine today-hold Demadex for now.  Pulmonary embolism: Has been on Lovenox for the past several days-we will transition to Eliquis.  Lower extremity Doppler negative for DVT.  Lung mass: Images reviewed with Dr. Elige Radon Icard on 5/17-suspected consolidation likely secondary to MSSA bacteremia rather than a malignancy.  Does not advise that be proceed with a bronchoscopy right away, recommended that we treat with antimicrobial therapy for several weeks with plans to repeat CT chest in a month or so-before considering bronchoscopy.  Patient is ex-smoker-quit 2009.   Elevated troponin: Probably demand ischemia in the setting of PE/hypoxemia-recheck troponin levels later this afternoon.  EKG nonacute.  Outpatient cardiology note-last LHC in 2014 revealed normal coronary arteries.  HTN: Controlled-resuming low-dose beta-blocker today.  Bilateral foot pain: Ongoing for the past 1-2 months-apparently per patient-he stopped ambulating due to pain.  Pain is mostly in his bilateral toes and forefoot area-no obvious local swelling/erythema evident.  Uric acid levels on the higher side-however no significant response to steroids, plain x-rays without any acute abnormalities.  Started Neurontin on 5/17-feels some improvement-continue to optimize Neurontin dosage over the next few days.  Insulin-dependent DM-2 with uncontrolled hyperglycemia due to poor outpatient control worsened by steroids (A1c 12.4 on 08/12/2019): CBGs somewhat stable-steroids just discontinued on 5/18-continue Lantus 32 units, 6 units of NovoLog with meals and SSI.  Follow and adjust.    Recent Labs    08/16/19 2056 08/17/19 0747 08/17/19 1134  GLUCAP 132* 207* 211*   OSA: Claims that his machine broke several months ago-we will try to place him on CPAP nightly if he tolerates here in the hospital.  Morbid Obesity: Estimated body mass index is 38.75  kg/m as calculated from the following:   Height as of this encounter: 5\' 6"  (1.676 m).   Weight as of this encounter: 108.9 kg.    ABG:    Component Value Date/Time   PHART 7.484 (H) 08/12/2019 1023   PCO2ART 25.6 (L) 08/12/2019 1023   PO2ART 83.8 08/12/2019 1023   HCO3 19.1 (L) 08/12/2019 1023   TCO2 16 (L) 08/11/2019 2349   ACIDBASEDEF 3.9 (H) 08/12/2019 1023   O2SAT 97.6 08/12/2019 1023    Vent Settings: N/A   Condition - Stable  Family Communication  : Voicemail for spouse ([phone]) on 5/19  Code Status : Full code    Diet :  Diet Order            Diet heart healthy/carb modified Room service appropriate? Yes; Fluid consistency: Thin; Fluid restriction: 1500 mL Fluid  Diet effective now               Disposition Plan  :   Status is: Inpatient  Remains inpatient appropriate because:Inpatient level of care appropriate due to severity of illness  Dispo: The patient is from: Home              Anticipated d/c is to: [facility]              Anticipated d/c date is: > 3 days              Patient currently is not medically stable to d/c.  Barriers to discharge:IV antimicrobial therapy-AKI-MSSA bacteremia-TEE scheduled for this coming Friday.  Antimicorbials  :    Anti-infectives (From admission, onward)   Start     Dose/Rate Route Frequency Ordered Stop   08/13/19 1000  remdesivir 100 mg in sodium chloride 0.9 % 100 mL IVPB     100 mg 200 mL/hr over 30 Minutes Intravenous Daily 08/11/19 2347 08/16/19 0843   08/13/19 0600  ceFAZolin (ANCEF) IVPB 2g/100 mL premix     2 g 200 mL/hr over 30 Minutes Intravenous Every 8 hours 08/12/19 2248     08/12/19 2200  ceFAZolin (ANCEF) IVPB 2g/100 mL premix     2 g 200 mL/hr over 30 Minutes Intravenous Every 8 hours 08/12/19 1840 08/12/19 2218   08/12/19 1800  azithromycin (ZITHROMAX) 500 mg in sodium chloride 0.9 % 250 mL IVPB  Status:  Discontinued     500 mg 250 mL/hr over 60 Minutes Intravenous Every 24 hours 08/12/19 0709  08/13/19 1223   08/12/19 0800  Ampicillin-Sulbactam (UNASYN) 3 g in sodium chloride 0.9 % 100 mL IVPB  Status:  Discontinued     3 g 200 mL/hr over 30 Minutes Intravenous Every 6 hours 08/12/19 0730 08/12/19 1840   08/12/19 0030  remdesivir 200 mg in sodium chloride 0.9% 250 mL IVPB     200 mg 580 mL/hr over 30 Minutes Intravenous Once 08/11/19 2347 08/12/19 0219   08/11/19 1700  cefTRIAXone (ROCEPHIN) 1 g in sodium chloride 0.9 % 100 mL IVPB     1 g 200 mL/hr over 30 Minutes Intravenous  Once 08/11/19 1649 08/11/19 1756   08/11/19 1700  azithromycin (ZITHROMAX) 500 mg in sodium chloride 0.9 % 250 mL IVPB     500 mg 250 mL/hr over 60 Minutes Intravenous  Once 08/11/19 1649 08/11/19 2054      Inpatient Medications  Scheduled Meds: . apixaban  10 mg Oral BID   Followed by  . [START ON 08/23/2019] apixaban  5 mg Oral BID  . carvedilol  3.125 mg Oral BID WC  . gabapentin  100 mg Oral TID  . insulin aspart  0-20 Units Subcutaneous TID WC  . insulin aspart  0-5 Units Subcutaneous QHS  . insulin aspart  6 Units Subcutaneous TID WC  . insulin glargine  32 Units Subcutaneous QHS  . pneumococcal 23 valent vaccine  0.5 mL Intramuscular Tomorrow-1000  . potassium chloride  40 mEq Oral Daily  . torsemide  30 mg Oral Daily   Continuous Infusions: .  ceFAZolin (ANCEF) IV 2 g (08/17/19 0804)   PRN  Meds:.acetaminophen **OR** acetaminophen, albuterol, chlorpheniramine-HYDROcodone, guaiFENesin-dextromethorphan, ondansetron **OR** ondansetron (ZOFRAN) IV, polyethylene glycol   Time Spent in minutes 25   See all Orders from today for further details   Jeoffrey Massed M.D on 08/17/2019 at 11:50 AM  To page go to [email or web address] - use universal password  [facility] -  Office  [phone]    Objective:   Vitals:   08/17/19 0000 08/17/19 0414 08/17/19 0754 08/17/19 1100  BP: 123/75  110/75 105/83  Pulse: 89  86 88  Resp: Temp: 98.7 F (37.1 C) 98.2 F (36.8 C) 98.1 F  (36.7 C) 98 F (36.7 C)  TempSrc: Oral Oral Oral Oral  SpO2: 93%  94% 93%  Weight:  108.9 kg    Height:        Wt Readings from Last 3 Encounters:  08/17/19 108.9 kg  06/22/19 118.7 kg  12/02/16 124.4 kg     Intake/Output Summary (Last 24 hours) at 08/17/2019 1150 Last data filed at 08/17/2019 0900 Gross per 24 hour  Intake 550 ml  Output 2300 ml  Net -1750 ml     Physical Exam Gen Exam:Alert awake-not in any distress HEENT:atraumatic, normocephalic Chest: B/L clear to auscultation anteriorly CVS:S1S2 regular Abdomen:soft non tender, non distended Extremities:no edema Neurology: Non focal Skin: no rash   Data Review:    CBC Recent Labs  Lab 08/12/19 0922 08/12/19 0922 08/13/19 1032 08/14/19 0641 08/15/19 0422 08/16/19 0412 08/17/19 0950  WBC 14.7*   < > 12.8* 13.2* 10.0 12.3* 13.8*  HGB 15.3   < > 14.8 14.4 13.8 14.2 15.2  HCT 48.4   < > 48.7 45.7 42.9 43.8 47.8  PLT 297   < > 375 400 392 420* 428*  MCV 85.1   < > 87.9 84.9 83.5 81.9 84.0  MCH 26.9   < > 26.7 26.8 26.8 26.5 26.7  MCHC 31.6   < > 30.4 31.5 32.2 32.4 31.8  RDW 16.3*   < > 16.3* 15.9* 15.5 15.2 15.3  LYMPHSABS 1.2  --  0.8 1.0 0.9 1.0  --   MONOABS 0.6  --  0.7 0.7 0.6 0.6  --   EOSABS 0.0  --  0.0 0.0 0.0 0.0  --   BASOSABS 0.0  --  0.0 0.0 0.0 0.0  --    < > = values in this interval not displayed.    Chemistries  Recent Labs  Lab 08/12/19 0922 08/12/19 0922 08/13/19 1032 08/14/19 0641 08/15/19 0422 08/16/19 0412 08/17/19 0950  NA 140   < > 142 137 134* 135 132*  K 4.5   < > 5.1 5.0 3.8 3.5 4.0  CL 107   < > 107 99 97* 100 95*  CO2 19*   < > 17* GLUCOSE 157*   < > 221* 87 345* 243* 272*  BUN 14   < > 30* 25* 31* 35* 31*  CREATININE 1.53*   < > 1.83* 1.36* 1.57* 1.50* 1.68*  CALCIUM 8.8*   < > 8.6* 8.5* 8.2* 8.2* 8.1*  MG 1.9  --  2.2 2.1  --   --  2.0  AST 24   < > 81* 43* ALT 18   < > ALKPHOS 63   < > 75 93 58 58 65  BILITOT 1.6*    < > 1.4* 1.6* 0.8 0.7 0.8   < > =  values in this interval not displayed.   ------------------------------------------------------------------------------------------------------------------ No results for input(s): CHOL, HDL, LDLCALC, TRIG, CHOLHDL, LDLDIRECT in the last 72 hours.  Lab Results  Component Value Date   HGBA1C 12.4 (H) 08/12/2019   ------------------------------------------------------------------------------------------------------------------ No results for input(s): TSH, T4TOTAL, T3FREE, THYROIDAB in the last 72 hours.  Invalid input(s): FREET3 ------------------------------------------------------------------------------------------------------------------ Recent Labs    08/15/19 0422 08/16/19 0412  FERRITIN 712* 627*    Coagulation profile Recent Labs  Lab 08/11/19 1614 08/12/19 0826  INR 1.7* 2.2*    Recent Labs    08/15/19 0422 08/16/19 0412  DDIMER 3.86* 3.94*    Cardiac Enzymes No results for input(s): CKMB, TROPONINI, MYOGLOBIN in the last 168 hours.  Invalid input(s): CK ------------------------------------------------------------------------------------------------------------------    Component Value Date/Time   BNP 701.0 (H) 08/13/2019 1032    Micro Results Recent Results (from the past 240 hour(s))  Blood Culture (routine x 2)     Status: Abnormal   Collection Time: 08/11/19  3:58 PM   Specimen: BLOOD RIGHT ARM  Result Value Ref Range Status   Specimen Description BLOOD RIGHT ARM  Final   Special Requests   Final    BOTTLES DRAWN AEROBIC AND ANAEROBIC Blood Culture adequate volume   Culture  Setup Time   Final    GRAM POSITIVE COCCI IN CLUSTERS ANAEROBIC BOTTLE ONLY CRITICAL RESULT CALLED TO, READ BACK BY AND VERIFIED WITHIhor Austin PHARMD 1826 08/12/19 A BROWNING Performed at [facility], [address] [place], [place] [address]    Culture STAPHYLOCOCCUS AUREUS (A)  Final   Report Status 08/14/2019 FINAL  Final    Organism ID, Bacteria STAPHYLOCOCCUS AUREUS  Final      Susceptibility   Staphylococcus aureus - MIC*    CIPROFLOXACIN <=0.5 SENSITIVE Sensitive     ERYTHROMYCIN >=8 RESISTANT Resistant     GENTAMICIN <=0.5 SENSITIVE Sensitive     OXACILLIN 0.5 SENSITIVE Sensitive     TETRACYCLINE <=1 SENSITIVE Sensitive     VANCOMYCIN 1 SENSITIVE Sensitive     TRIMETH/SULFA <=10 SENSITIVE Sensitive     CLINDAMYCIN <=0.25 SENSITIVE Sensitive     RIFAMPIN <=0.5 SENSITIVE Sensitive     Inducible Clindamycin NEGATIVE Sensitive     * STAPHYLOCOCCUS AUREUS  Blood Culture ID Panel (Reflexed)     Status: Abnormal   Collection Time: 08/11/19  3:58 PM  Result Value Ref Range Status   Enterococcus species NOT DETECTED NOT DETECTED Final   Listeria monocytogenes NOT DETECTED NOT DETECTED Final   Staphylococcus species DETECTED (A) NOT DETECTED Final    Comment: CRITICAL RESULT CALLED TO, READ BACK BY AND VERIFIED WITHIhor Austin PHARMD 1826 08/12/19 A BROWNING    Staphylococcus aureus (BCID) DETECTED (A) NOT DETECTED Final    Comment: Methicillin (oxacillin) susceptible Staphylococcus aureus (MSSA). Preferred therapy is anti staphylococcal beta lactam antibiotic (Cefazolin or Nafcillin), unless clinically contraindicated. CRITICAL RESULT CALLED TO, READ BACK BY AND VERIFIED WITH: Ihor Austin PHARMD 1826 08/12/19 A BROWNING    Methicillin resistance NOT DETECTED NOT DETECTED Final   Streptococcus species NOT DETECTED NOT DETECTED Final   Streptococcus agalactiae NOT DETECTED NOT DETECTED Final   Streptococcus pneumoniae NOT DETECTED NOT DETECTED Final   Streptococcus pyogenes NOT DETECTED NOT DETECTED Final   Acinetobacter baumannii NOT DETECTED NOT DETECTED Final   Enterobacteriaceae species NOT DETECTED NOT DETECTED Final   Enterobacter cloacae complex NOT DETECTED NOT DETECTED Final   Escherichia coli NOT DETECTED NOT DETECTED Final   Klebsiella oxytoca NOT  DETECTED NOT DETECTED Final   Klebsiella pneumoniae  NOT DETECTED NOT DETECTED Final   Proteus species NOT DETECTED NOT DETECTED Final   Serratia marcescens NOT DETECTED NOT DETECTED Final   Haemophilus influenzae NOT DETECTED NOT DETECTED Final   Neisseria meningitidis NOT DETECTED NOT DETECTED Final   Pseudomonas aeruginosa NOT DETECTED NOT DETECTED Final   Candida albicans NOT DETECTED NOT DETECTED Final   Candida glabrata NOT DETECTED NOT DETECTED Final   Candida krusei NOT DETECTED NOT DETECTED Final   Candida parapsilosis NOT DETECTED NOT DETECTED Final   Candida tropicalis NOT DETECTED NOT DETECTED Final    Comment: Performed at [facility], [address], [place], [place] [address]  Urine culture     Status: None   Collection Time: 08/11/19  9:06 PM   Specimen: In/Out Cath Urine  Result Value Ref Range Status   Specimen Description IN/OUT CATH URINE  Final   Special Requests NONE  Final   Culture   Final    NO GROWTH Performed at [facility], [address], [place], [place] [address]    Report Status 08/14/2019 FINAL  Final  Culture, blood (routine x 2)     Status: None (Preliminary result)   Collection Time: 08/14/19  6:41 AM   Specimen: BLOOD  Result Value Ref Range Status   Specimen Description BLOOD RIGHT ANTECUBITAL  Final   Special Requests   Final    BOTTLES DRAWN AEROBIC AND ANAEROBIC Blood Culture results may not be optimal due to an inadequate volume of blood received in culture bottles   Culture   Final    NO GROWTH 3 DAYS Performed at [facility], [address] [place], [place] [address]    Report Status PENDING  Incomplete  Culture, blood (routine x 2)     Status: None (Preliminary result)   Collection Time: 08/14/19  6:41 AM   Specimen: BLOOD RIGHT HAND  Result Value Ref Range Status   Specimen Description BLOOD RIGHT HAND  Final   Special Requests   Final    BOTTLES DRAWN AEROBIC AND ANAEROBIC Blood Culture results may not be optimal due to an inadequate volume of blood received  in culture bottles   Culture   Final    NO GROWTH 3 DAYS Performed at [facility], [address], [place], [place] [address]    Report Status PENDING  Incomplete    Radiology Reports CT Angio Chest PE W/Cm &/Or Wo Cm  Result Date: 08/11/2019 CLINICAL DATA:  Shortness of breath, hypoxia, hemoptysis, recent COVID-19 diagnosis 6 days prior EXAM: CT ANGIOGRAPHY CHEST WITH CONTRAST TECHNIQUE: Multidetector CT imaging of the chest was performed using the standard protocol during bolus administration of intravenous contrast. Multiplanar CT image reconstructions and MIPs were obtained to evaluate the vascular anatomy. CONTRAST:  OMNIPAQUE IOHEXOL 350 MG/ML SOLN COMPARISON:  Same-day radiograph FINDINGS: Cardiovascular: Satisfactory opacification the pulmonary arteries to the segmental level. Evaluation beyond the lobar level is limited due to extensive respiratory motion artifact. However, there is suspected filling defect within the segmental and subsegmental pulmonary arteries of the posterior right lower lobe extending to the ostium of the right upper lobar pulmonary artery. Finding is best seen on coronal MIP imaging series 14, image [id number]. Several of the pulmonary arteries in the region of the superior segment right lower lobe and right middle lobe appear narrowed as the past within the perihilar density detailed below. Central pulmonary arteries are normal  caliber. Slight reflux of contrast into the IVC and hepatic veins. Without other convincing CT evidence of right heart strain. Borderline cardiomegaly without pericardial effusion. Atherosclerotic plaque within the normal caliber aorta. Shared origin of the left common carotid and brachiocephalic artery. Minimal plaque in the proximal great vessels. Mediastinum/Nodes: Multiple subcentimeter mediastinal and hilar nodes are present. More confluent soft tissue opacity extending within the subcarinal space in into the right hilum (9/44-48). Mild  focal narrowing of the right mainstem bronchus and bronchus intermedius in this region of soft tissue attenuation. Possibly reflecting conglomerate nodal tissue versus direct extension of a mass. Tiny diverticulum seen arising from the inferomedial aspect of the left mainstem bronchus. No other acute abnormality of the central airways. Difficult to discern the margins of the distal thoracic esophagus from the soft tissue in the subcarinal region. Thyroid gland and thoracic inlet are unremarkable. Lungs/Pleura: Dense masslike consolidation measuring approximately 7.7 x 8.2 cm (11/35) is seen in the posterior segment right upper lobe abutting the major fissure with confluent soft tissue attenuation extending into the right hilum and subcarinal region which is highly worrisome for an underlying mass lesion particularly given narrowing of the airways and pulmonary arteries in this location. However, there are additional peripheral tree-in-bud opacities and multifocal mixed ground-glass and consolidation elsewhere throughout the lungs. No visible pneumothorax or effusion. Background of centrilobular emphysema is noted towards the apices. Upper Abdomen: No acute abnormalities present in the visualized portions of the upper abdomen. Musculoskeletal: No acute osseous abnormality or suspicious osseous lesion. Multilevel degenerative changes are present in the imaged portions of the spine. Additional degenerative changes at the left sternoclavicular joint and bilateral shoulders. Review of the MIP images confirms the above findings. IMPRESSION: 1. Segmental and subsegmental pulmonary artery emboli in the pulmonary arteries of the posterior segment right upper lobe extending towards the ostial origin from the left upper lobar pulmonary artery. No CT evidence of right heart strain. 2. Dense masslike consolidation measuring approximately 7.7 x 8.2 cm in the posterior segment right upper lobe abutting the major fissure with  confluent soft tissue attenuation extending into the right hilum and subcarinal region which is highly worrisome for an underlying mass lesion particularly given narrowing of the airways and pulmonary arteries in this location. While dense infectious consolidation or large pulmonary infarct could have a similar appearance high suspicion should be maintained with continued follow-up imaging following acute intervention. Surrounding areas of ground-glass and tree-in-bud nodularity could reflect some postobstructive pneumonia given truncation the airways entering this region. 3. Additional multifocal areas of mixed consolidation and ground-glass opacity compatible with atypical pneumonia in the setting of known COVID-19 positivity. 4. Additional subcentimeter mediastinal and hilar nodes, likely reactive though metastatic disease is not excluded. 5. Aortic Atherosclerosis (ICD10-I70.0). These results were called by telephone at the time of interpretation on 08/11/2019 at 10:48 pm to provider GARRETT GREEN , who verbally acknowledged these results. Electronically Signed   By: Kreg Shropshire M.D.   On: 08/11/2019 22:49   DG Chest Port 1 View  Result Date: 08/11/2019 CLINICAL DATA:  Fall shortness of breath, hypoxia, COVID-19 positive EXAM: PORTABLE CHEST 1 VIEW COMPARISON:  Radiograph 10/24/2013 FINDINGS: Focal opacity is seen in the right mid lung, likely in the upper lobe. More mixed patchy and interstitial opacities are seen in the lung bases and periphery. No pneumothorax or visible effusion. Mild cardiomegaly though possibly accentuated by portable technique. Remaining cardiomediastinal contours are unremarkable. No acute osseous or soft tissue abnormality. Telemetry leads overlie the  chest. IMPRESSION: 1. Focal opacity in the right mid lung, likely in the upper lobe, suspicious for pneumonia. 2. Additional patchy and interstitial opacities in the lung bases and periphery may reflect additional areas of infection in  the setting of COVID-19. Electronically Signed   By: Lovena Le M.D.   On: 08/11/2019 15:38   DG Foot Complete Left  Result Date: 08/14/2019 CLINICAL DATA:  Left foot pain, bacteremia EXAM: LEFT FOOT - COMPLETE 3+ VIEW COMPARISON:  None. FINDINGS: No acute fracture. No dislocation. Mild first MTP joint space narrowing. Well defined marginal lucencies with sclerotic margins at the medial aspect of the left first metatarsal head, which can be seen in the setting of a crystalline arthropathy such as gout. Small plantar calcaneal spur. There is soft tissue swelling over the dorsum of the forefoot. IMPRESSION: 1. Soft tissue swelling over the dorsum of the forefoot without underlying fracture or dislocation. 2. Small well defined marginal lucencies at the medial aspect of the left first metatarsal head, which may reflect sequela of gout. Electronically Signed   By: Davina Poke D.O.   On: 08/14/2019 17:10   DG Foot Complete Right  Result Date: 08/14/2019 CLINICAL DATA:  Bilateral foot pain for 2 months. Bacteremia EXAM: RIGHT FOOT COMPLETE - 3+ VIEW COMPARISON:  04/20/2008 FINDINGS: Postsurgical changes related to ORIF of the first through fourth tarsometatarsal joints. Hardware appears intact without perihardware lucency or fracture. 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 No acute osseous abnormality. No erosion or cortical destruction. Soft tissues are within normal limits. IMPRESSION: 1. No acute osseous abnormality. 2. Postsurgical changes related to ORIF of the first through fourth tarsometatarsal joints. No evidence for hardware complication. Electronically Signed   By: Davina Poke D.O.   On: 08/14/2019 17:08   ECHOCARDIOGRAM COMPLETE  Result Date: 08/12/2019    ECHOCARDIOGRAM REPORT   Patient Name:   CHAMAR BROUGHTON Date of Exam: 08/12/2019 Medical Rec #:  [id number]     Height:       66.0 in Accession #:    [id number]    Weight:       232.8 lb Date of Birth:  [birth year]     BSA:           2.133 m Patient Age:    17 years      BP:           90/75 mmHg Patient Gender: M             HR:           98 bpm. Exam Location:  Inpatient Procedure: 2D Echo Indications:    415.19 pulmonary embolus  History:        Patient has prior history of Echocardiogram examinations, most                 recent 06/26/2019. Cardiomyopathy; Risk Factors:Hypertension,                 Diabetes, Dyslipidemia and Former Smoker.  Sonographer:    Jannett Celestine RDCS (AE) Referring Phys: [id number] Sherryll Burger [facility]  Sonographer Comments: Suboptimal apical window. Image acquisition challenging due to patient body habitus and Image acquisition challenging due to respiratory motion. very limited mobility IMPRESSIONS  1. Left ventricular ejection fraction, by estimation, is <20%. The left ventricle has severely decreased function. The left ventricle demonstrates global hypokinesis. The left ventricular internal cavity size was moderately dilated. Left ventricular diastolic function could not be evaluated.  2. Right ventricular systolic function  is severely reduced. The right ventricular size is moderately enlarged. Tricuspid regurgitation signal is inadequate for assessing PA pressure.  3. Left atrial size was mild to moderately dilated.  4. Right atrial size was mildly dilated.  5. The mitral valve is degenerative. Trivial mitral valve regurgitation. No evidence of mitral stenosis.  6. The aortic valve is tricuspid. Aortic valve regurgitation is not visualized. Mild aortic valve sclerosis is present, with no evidence of aortic valve stenosis.  7. The inferior vena cava is dilated in size with <50% respiratory variability, suggesting right atrial pressure of 15 mmHg. Conclusion(s)/Recommendation(s): Probable moderate sized thrombus in LV apex. Recommend definity contrast to further evaulated extent. FINDINGS  Left Ventricle: Left ventricular ejection fraction, by estimation, is <20%. The left ventricle has severely decreased function.  The left ventricle demonstrates global hypokinesis. The left ventricular internal cavity size was moderately dilated. There is no left ventricular hypertrophy. Left ventricular diastolic function could not be evaluated. Right Ventricle: The right ventricular size is moderately enlarged. No increase in right ventricular wall thickness. Right ventricular systolic function is severely reduced. Tricuspid regurgitation signal is inadequate for assessing PA pressure. Left Atrium: Left atrial size was mild to moderately dilated. Right Atrium: Right atrial size was mildly dilated. Pericardium: There is no evidence of pericardial effusion. Mitral Valve: The mitral valve is degenerative in appearance. There is mild thickening of the mitral valve leaflet(s). Normal mobility of the mitral valve leaflets. Mild to moderate mitral annular calcification. Trivial mitral valve regurgitation. No evidence of mitral valve stenosis. Tricuspid Valve: The tricuspid valve is normal in structure. Tricuspid valve regurgitation is trivial. No evidence of tricuspid stenosis. Aortic Valve: The aortic valve is tricuspid. Aortic valve regurgitation is not visualized. Mild aortic valve sclerosis is present, with no evidence of aortic valve stenosis. Pulmonic Valve: The pulmonic valve was not well visualized. Pulmonic valve regurgitation is trivial. No evidence of pulmonic stenosis. Aorta: The aortic root is normal in size and structure. Venous: The inferior vena cava is dilated in size with less than 50% respiratory variability, suggesting right atrial pressure of 15 mmHg. IAS/Shunts: No atrial level shunt detected by color flow Doppler.  LEFT VENTRICLE PLAX 2D LVIDd:         6.10 cm LVIDs:         5.60 cm LV PW:         1.20 cm LV IVS:        0.90 cm LVOT diam:     2.30 cm LV SV:         38 LV SV Index:   18 LVOT Area:     4.15 cm  RIGHT VENTRICLE TAPSE (M-mode): 1.1 cm LEFT ATRIUM           Index       RIGHT ATRIUM           Index LA diam:       3.50 cm 1.64 cm/m  RA Area:     19.40 cm LA Vol (A2C): 92.6 ml 43.41 ml/m RA Volume:   58.70 ml  27.52 ml/m LA Vol (A4C): 21.9 ml 10.27 ml/m  AORTIC VALVE LVOT Vmax:   62.70 cm/s LVOT Vmean:  46.100 cm/s LVOT VTI:    0.090 m  AORTA Ao Root diam: 3.60 cm  SHUNTS Systemic VTI:  0.09 m Systemic Diam: 2.30 cm Armanda Magic MD Electronically signed by Armanda Magic MD Signature Date/Time: 08/12/2019/2:29:09 PM    Final    VAS [place] LOWER EXTREMITY VENOUS (DVT)  Result Date: 08/15/2019  Lower Venous DVTStudy Indications: COVID-19 positive, Pain, and Edema.  Comparison Study: No prior study Performing Technologist: Gertie Fey MHA, RDMS, RVT, RDCS  Examination Guidelines: A complete evaluation includes B-mode imaging, spectral Doppler, color Doppler, and power Doppler as needed of all accessible portions of each vessel. Bilateral testing is considered an integral part of a complete examination. Limited examinations for reoccurring indications may be performed as noted. The reflux portion of the exam is performed with the patient in reverse Trendelenburg.  +---------+---------------+---------+-----------+----------+--------------+ RIGHT    CompressibilityPhasicitySpontaneityPropertiesThrombus Aging +---------+---------------+---------+-----------+----------+--------------+ CFV      Full           Yes      Yes                                 +---------+---------------+---------+-----------+----------+--------------+ SFJ      Full                                                        +---------+---------------+---------+-----------+----------+--------------+ FV Prox  Full                                                        +---------+---------------+---------+-----------+----------+--------------+ FV Mid   Full                                                        +---------+---------------+---------+-----------+----------+--------------+ FV DistalFull                                                         +---------+---------------+---------+-----------+----------+--------------+ PFV      Full                                                        +---------+---------------+---------+-----------+----------+--------------+ POP      Full           Yes      Yes                                 +---------+---------------+---------+-----------+----------+--------------+ PTV      Full                                                        +---------+---------------+---------+-----------+----------+--------------+ PERO     Full                                                        +---------+---------------+---------+-----------+----------+--------------+   +---------+---------------+---------+-----------+----------+--------------+  LEFT     CompressibilityPhasicitySpontaneityPropertiesThrombus Aging +---------+---------------+---------+-----------+----------+--------------+ CFV      Full           Yes      Yes                                 +---------+---------------+---------+-----------+----------+--------------+ SFJ      Full                                                        +---------+---------------+---------+-----------+----------+--------------+ FV Prox  Full                                                        +---------+---------------+---------+-----------+----------+--------------+ FV Mid   Full                                                        +---------+---------------+---------+-----------+----------+--------------+ FV DistalFull                                                        +---------+---------------+---------+-----------+----------+--------------+ PFV      Full                                                        +---------+---------------+---------+-----------+----------+--------------+ POP      Full           Yes      Yes                                  +---------+---------------+---------+-----------+----------+--------------+ PTV      Full                                                        +---------+---------------+---------+-----------+----------+--------------+ PERO     Full                                                        +---------+---------------+---------+-----------+----------+--------------+     Summary: RIGHT: - There is no evidence of deep vein thrombosis in the lower extremity.  - No cystic structure found in the popliteal fossa.  LEFT: - There is no evidence of deep vein thrombosis in the lower extremity.  - No  cystic structure found in the popliteal fossa.  *See table(s) above for measurements and observations. Electronically signed by Sherald Hess MD on 08/15/2019 at 5:46:23 PM.    Final    [place] EKG SITE RITE  Result Date: 08/13/2019 If Site Rite image not attached, placement could not be confirmed due to current cardiac rhythm.  US Abdomen Limited RUQ  Result Date: 08/12/2019 CLINICAL DATA:  Abnormal LFTs EXAM: ULTRASOUND ABDOMEN LIMITED RIGHT UPPER QUADRANT COMPARISON:  None. FINDINGS: Gallbladder: The gallbladder appears to be filled with slightly a hyperechoic sludge. No sonographic Murphy sign. The gallbladder wall thickness measures 3 mm. No pericholecystic fluid. Common bile duct: Diameter: 5 mm Liver: No focal lesion identified. Within normal limits in parenchymal echogenicity. Portal vein is patent on color Doppler imaging with normal direction of blood flow towards the liver. Other: None. IMPRESSION: Gallbladder sludge.  No definite evidence of cholecystitis. Electronically Signed   By: Jonna Clark M.D.   On: 08/12/2019 01:58

## 2019-08-17 NOTE — Plan of Care (Signed)
  RD consulted for nutrition education regarding diabetes.   Lab Results  Component Value Date   HGBA1C 12.4 (H) 08/12/2019    Discussed different food groups and their effects on blood sugar, emphasizing carbohydrate-containing foods. Provided list of carbohydrates and recommended serving sizes of common foods.  Discussed importance of controlled and consistent carbohydrate intake throughout the day. Provided examples of ways to balance meals/snacks and encouraged intake of high-fiber, whole grain complex carbohydrates. Pt reports consuming 24-36 ounces of sugar sweetened beverages daily. Pt hesitant on consuming diet drinks instead to full regular based drinks. Encouraged decrease of sugar sweetened beverages. Pt report understanding. Teach back method used.  Expect fair to good compliance.  [name], MS, RD, LDN RD pager number/after hours weekend pager number on Amion.

## 2019-08-17 NOTE — Progress Notes (Signed)
    [facility]    Date of Admission:  08/11/2019   Total days of antibiotics 7/day 6 cefazolin          ID: Kevin Long is a 59 y.o. male with covid-19 c/b PE, post-obstructive pneumonia, pulmonary abscess with mssa bacteremia  Principal Problem:   Sepsis due to COVID-19 [facility] Active Problems:   Essential hypertension   Chronic combined systolic and diastolic congestive heart failure (HCC)   Uncontrolled type 2 diabetes mellitus with hyperglycemia, with long-term current use of insulin (HCC)   Acute metabolic encephalopathy   Acute respiratory failure with hypoxemia (HCC)   Lactic acidosis   Acute pulmonary embolism without acute cor pulmonale (HCC)   AKI (acute kidney injury) (HCC)   Mass of upper lobe of right lung    Subjective: afebrile  Medications:  . apixaban  10 mg Oral BID   Followed by  . [START ON 08/23/2019] apixaban  5 mg Oral BID  . carvedilol  3.125 mg Oral BID WC  . gabapentin  100 mg Oral TID  . insulin aspart  0-20 Units Subcutaneous TID WC  . insulin aspart  0-5 Units Subcutaneous QHS  . insulin aspart  6 Units Subcutaneous TID WC  . insulin glargine  32 Units Subcutaneous QHS  . pneumococcal 23 valent vaccine  0.5 mL Intramuscular Tomorrow-1000    Objective: Vital signs in last 24 hours: Temp:  [97.8 F (36.6 C)-98.7 F (37.1 C)] 98.1 F (36.7 C) (05/19 1500) Pulse Rate:  [86-94] 94 (05/19 1500) Resp:  [18-20] 19 (05/19 1500) BP: (93-123)/(72-90) 117/90 (05/19 1500) SpO2:  [93 %-98 %] 97 % (05/19 1500) Weight:  [108.9 kg] 108.9 kg (05/19 0414)   Lab Results Recent Labs    08/16/19 0412 08/17/19 0950  WBC 12.3* 13.8*  HGB 14.2 15.2  HCT 43.8 47.8  NA 135 132*  K 3.5 4.0  CL 100 95*  CO2 24 22  BUN 35* 31*  CREATININE 1.50* 1.68*   Liver Panel Recent Labs    08/16/19 0412 08/17/19 0950  PROT 5.9* 6.1*  ALBUMIN 2.1* 2.2*  AST 24 28  ALT 10 8  ALKPHOS 58 65  BILITOT 0.7 0.8   Sedimentation  Rate No results for input(s): ESRSEDRATE in the last 72 hours. C-Reactive Protein Recent Labs    08/16/19 0412 08/17/19 0950  CRP 4.7* 1.9*    Microbiology: Reviewed, blood cx from 5/16 NGTD Studies/Results: No results found.   Assessment/Plan: Complicated MSSA bacteremia with post-obstructive pneumonia/abscess = recommend to treat with at least 4 wk of IV cefazolin. TTE did not show vegetation but had limited view. Still would recommend to get TEE, Friday appears to be scheduled thanks to dr ghimire and dr Jacinto Halim. If positive, would then extend IV abtx for 6 wk and further work up.  Presumed MSSA lung abscess/post obstructive pneumonia = recommend to repeat CT in 2-4 wk to ensure mass/affected area of 7 x 7 cm is improving while receiving abtx therapy  covid-19 pneumonia c/b bacterial infection and PE= finished remdesivir/steroids. Currently on anticoagulation  Acute on chronic systolic heart failure - had been diuresed initially now euvolemic.  [facility] Cell: [phone] Pager: [phone]  08/17/2019, 5:10 PM

## 2019-08-18 ENCOUNTER — Encounter (HOSPITAL_COMMUNITY): Payer: Self-pay | Admitting: Internal Medicine

## 2019-08-18 ENCOUNTER — Inpatient Hospital Stay (HOSPITAL_COMMUNITY): Payer: Medicaid Other | Admitting: Certified Registered"

## 2019-08-18 ENCOUNTER — Inpatient Hospital Stay: Payer: Self-pay

## 2019-08-18 LAB — BASIC METABOLIC PANEL
Anion gap: 10 (ref 5–15)
BUN: 29 mg/dL — ABNORMAL HIGH (ref 6–20)
CO2: 31 mmol/L (ref 22–32)
Calcium: 8.3 mg/dL — ABNORMAL LOW (ref 8.9–10.3)
Chloride: 97 mmol/L — ABNORMAL LOW (ref 98–111)
Creatinine, Ser: 1.47 mg/dL — ABNORMAL HIGH (ref 0.61–1.24)
GFR calc Af Amer: 59 mL/min — ABNORMAL LOW (ref >60)
GFR calc non Af Amer: 51 mL/min — ABNORMAL LOW (ref >60)
Glucose, Bld: 75 mg/dL (ref 70–99)
Potassium: 3.6 mmol/L (ref 3.5–5.1)
Sodium: 138 mmol/L (ref 135–145)

## 2019-08-18 LAB — GLUCOSE, CAPILLARY
Glucose-Capillary: 61 mg/dL — ABNORMAL LOW (ref 70–99)
Glucose-Capillary: 222 mg/dL — ABNORMAL HIGH (ref 70–99)
Glucose-Capillary: 115 mg/dL — ABNORMAL HIGH (ref 70–99)
Glucose-Capillary: 120 mg/dL — ABNORMAL HIGH (ref 70–99)
Glucose-Capillary: 84 mg/dL (ref 70–99)

## 2019-08-18 MED ORDER — POTASSIUM CHLORIDE CRYS ER 20 MEQ PO TBCR
40.0000 meq | EXTENDED_RELEASE_TABLET | Freq: Once | ORAL | Status: AC
  Administered 2019-08-18: 40 meq via ORAL
  Filled 2019-08-18: qty 2

## 2019-08-18 MED ORDER — CHLORHEXIDINE GLUCONATE CLOTH 2 % EX PADS
6.0000 | MEDICATED_PAD | Freq: Every day | CUTANEOUS | Status: DC
  Administered 2019-08-19: 6 via TOPICAL

## 2019-08-18 MED ORDER — SODIUM CHLORIDE 0.9% FLUSH
10.0000 mL | Freq: Two times a day (BID) | INTRAVENOUS | Status: DC
  Administered 2019-08-19: 10 mL

## 2019-08-18 MED ORDER — SODIUM CHLORIDE 0.9% FLUSH
10.0000 mL | INTRAVENOUS | Status: DC | PRN
  Administered 2019-08-19: 10 mL

## 2019-08-18 MED ORDER — INSULIN GLARGINE 100 UNIT/ML ~~LOC~~ SOLN
28.0000 [IU] | Freq: Every day | SUBCUTANEOUS | Status: DC
  Administered 2019-08-18: 28 [IU] via SUBCUTANEOUS
  Filled 2019-08-18 (×3): qty 0.28

## 2019-08-18 MED ORDER — MAGNESIUM SULFATE 2 GM/50ML IV SOLN
2.0000 g | Freq: Once | INTRAVENOUS | Status: AC
  Administered 2019-08-18: 2 g via INTRAVENOUS
  Filled 2019-08-18: qty 50

## 2019-08-18 NOTE — Anesthesia Preprocedure Evaluation (Deleted)
Anesthesia Evaluation    Airway        Dental   Pulmonary asthma , sleep apnea , pneumonia, unresolved, former smoker,  Non compliant with CPAP Covid + pneumonia   - rhonchi   (-) rales    Cardiovascular hypertension, Pt. on medications and Pt. on home beta blockers + angina with exertion +CHF       Neuro/Psych negative neurological ROS  negative psych ROS   GI/Hepatic negative GI ROS, Neg liver ROS,   Endo/Other  diabetes, Poorly Controlled, Type 2, Insulin DependentObesity  Renal/GU Renal InsufficiencyRenal disease  negative genitourinary   Musculoskeletal negative musculoskeletal ROS (+)   Abdominal (+) - obese,   Peds  Hematology Bacteremia   Anesthesia Other Findings   Reproductive/Obstetrics                           Anesthesia Physical Anesthesia Plan  ASA: III  Anesthesia Plan: General   Post-op Pain Management:    Induction: Intravenous  PONV Risk Score and Plan: 3 and Ondansetron, Treatment may vary due to age or medical condition and Propofol infusion  Airway Management Planned: Natural Airway and Nasal Cannula  Additional Equipment:   Intra-op Plan:   Post-operative Plan:   Informed Consent:   Plan Discussed with:   Anesthesia Plan Comments: (Patient ate a full breakfast this am. Case cancelled and will be rescheduled.)      Anesthesia Quick Evaluation

## 2019-08-18 NOTE — Progress Notes (Signed)
PROGRESS NOTE                                                                                                                                                                                                             Patient Demographics:    Kevin Long, is a 59 y.o. male, DOB - 09-18-1960, ZOX:[id number]  Outpatient Primary MD for the patient is Fleet Contras, MD   Admit date - 08/11/2019   LOS - 6  Chief Complaint  Patient presents with  . Sepsis alert, Covid pos       Brief Narrative: Patient is a 59 y.o. male with PMHx of chronic systolic heart failure, HTN, DM-2-, OSA noncompliant to CPAP who presented with shortness of breath-found to have acute hypoxic respiratory failure secondary to PE, COVID-19 pneumonia, lung mass, decompensated heart failure and possible bacterial pneumonia.  Post admission-blood cultures positive for MSSA.  See below for further details.  Significant Events: 4/29>> COVID-19 positive at [facility] (results available in care everywhere) 5/13>> admit to [facility] for hypoxia-CT chest with lung mass, postobstructive pneumonia, groundglass opacities and PE. 5/17>> transitioned to room air  Significant studies 5/13>> CT chest: 7.7x8.2 masslike consolidation in the right upper lobe with surrounding groundglass/tree-in-bud nodularity reflecting postobstructive pneumonia, additional areas of multifocal/groundglass pneumonia, segmental/subsegmental PE in the right upper lobe. 5/14>> TTE: EF<20%, RV systolic function severely reduced 5/17>> lower extremity Doppler: Negative for DVT  COVID-19 medications: Steroids: 5/13>>5/18 Remdesivir: 5/13>>5/18  Antibiotics: Ancef: 5/14>> Unasyn: 5/14>>5/14 Zithromax: 5/13>>5/15 Ceftriaxone: 5/13 x 1  Microbiology data: 5/16: Blood culture>> no growth 5/13: Blood culture>> 1/2 MSSA  5/13: Urine culture>> no growth   DVT prophylaxis: Therapeutic Lovenox>> Eliquis  on 5/18  Procedures: None  Consults: None   Subjective:   Continues to do well-on room air-significant improvement in his bilateral foot pain.   Assessment  & Plan :   Acute Hypoxic Resp Failure due to Covid 19 Viral pneumonia, postobstructive pneumonia and pulmonary embolism: Significantly improved-hypoxia has resolved-he has been on room air for the past few days.  Treated with steroids/remdesivir/anticoagulation and antibiotics.  Has completed a course of steroids/remdesivir-remains on anticoagulation and Ancef.   Fever: afebrile Incentive Spirometry: encouraged incentive spirometry use 3-4/hour. O2 requirements:  SpO2: 100 % O2 Flow Rate (L/min): 2 L/min   COVID-19 Labs: Recent Labs    08/16/19 0412 08/17/19  0950  DDIMER 3.94*  --   FERRITIN 627*  --   CRP 4.7* 1.9*       Component Value Date/Time   BNP 701.0 (H) 08/13/2019 1032    Recent Labs  Lab 08/11/19 1614 08/12/19 0922 08/13/19 1032 08/14/19 0641  PROCALCITON 0.73 6.16 5.22 5.06    No results found for: SARSCOV2NAA   MSSA bacteremia with possible septic emboli to the lung/lung abscess RUL: Remains on Ancef-repeat blood culture on 5/16 negative so far.  TTE on 5/14-no obvious vegetation.  Spoke with Dr. Macarthur Critchley will arrange for TEE on 5/21.  Acute on chronic systolic heart failure (EF 25-30% by TTE on 11/19/2016): Volume status is stable-due to mild AKI-diuretics remains on hold-continue low-dose Coreg. Per Dr. Macarthur Critchley has been noncompliant to medications and follow-up-and unfortunately he is not a candidate for device placement due to noncompliance.   Acute metabolic encephalopathy: Secondary to hypoxemia/bacteremia.  Encephalopathy has improved with treatment of the underlying etiologies.  Suspect he is not very far from his baseline.  AKI: Likely hemodynamically mediated in the setting of COVID-19-UA negative for proteinuria-continues to have mild AKI-improved creatinine slowly downtrending-due to  stability and volume status-hold diuretics today.  Pulmonary embolism: Initially on Lovenox-has been transitioned to Eliquis.   Lower extremity Doppler negative for DVT.  Lung mass: Images reviewed with Dr. Elige Radon Icard on 5/17-suspected consolidation likely secondary to MSSA bacteremia/lung consolidation due to infection rather than a malignancy.  Does not advise that be proceed with a bronchoscopy right away, recommended that we treat with antimicrobial therapy for several weeks with plans to repeat CT chest in a month or so-before considering bronchoscopy.  Patient is ex-smoker-quit 2009.   Elevated troponin: Probably demand ischemia in the setting of PE/hypoxemia-recheck troponin levels later this afternoon.  EKG nonacute.  Outpatient cardiology note-last LHC in 2014 revealed normal coronary arteries.  HTN: Controlled-resuming low-dose beta-blocker today.  Bilateral foot pain: Ongoing for the past 1-2 months-apparently per patient-he stopped ambulating due to pain.  Pain was mostly in the bilateral toes and forefoot area-no response to steroids-doubt this is gout even though uric acid levels on the higher side.  Started Neurontin-with significant improvement-hence likely neuropathic pain in the setting of diabetes.    Insulin-dependent DM-2 with uncontrolled hyperglycemia due to poor outpatient control worsened by steroids (A1c 12.4 on 08/12/2019): CBGs somewhat stable-slight hypoglycemic episode earlier this morning-we will go and reduce Lantus to 28 units, continue with 6 units of NovoLog and SSI.    Recent Labs    08/17/19 1554 08/18/19 0741 08/18/19 1127  GLUCAP 114* 61* 222*   OSA: Claims that his machine broke several months ago-we will try to place him on CPAP nightly if he tolerates here in the hospital.  Morbid Obesity: Estimated body mass index is 37.86 kg/m as calculated from the following:   Height as of this encounter: 5\' 6"  (1.676 m).   Weight as of this encounter: 106.4  kg.    ABG:    Component Value Date/Time   PHART 7.484 (H) 08/12/2019 1023   PCO2ART 25.6 (L) 08/12/2019 1023   PO2ART 83.8 08/12/2019 1023   HCO3 19.1 (L) 08/12/2019 1023   TCO2 16 (L) 08/11/2019 2349   ACIDBASEDEF 3.9 (H) 08/12/2019 1023   O2SAT 97.6 08/12/2019 1023    Vent Settings: N/A   Condition - Stable  Family Communication  :  ([id number]) on 5/20  Code Status : Full code    Diet :  Diet Order  Diet heart healthy/carb modified Room service appropriate? Yes; Fluid consistency: Thin; Fluid restriction: 1500 mL Fluid  Diet effective now               Disposition Plan  :   Status is: Inpatient  Remains inpatient appropriate because:Inpatient level of care appropriate due to severity of illness  Dispo: The patient is from: Home              Anticipated d/c is to: [facility]              Anticipated d/c date is: > 3 days              Patient currently is not medically stable to d/c.  Barriers to discharge:IV antimicrobial therapy-AKI-MSSA bacteremia-TEE scheduled for this coming Friday.  Antimicorbials  :    Anti-infectives (From admission, onward)   Start     Dose/Rate Route Frequency Ordered Stop   08/13/19 1000  remdesivir 100 mg in sodium chloride 0.9 % 100 mL IVPB     100 mg 200 mL/hr over 30 Minutes Intravenous Daily 08/11/19 2347 08/16/19 0843   08/13/19 0600  ceFAZolin (ANCEF) IVPB 2g/100 mL premix     2 g 200 mL/hr over 30 Minutes Intravenous Every 8 hours 08/12/19 2248     08/12/19 2200  ceFAZolin (ANCEF) IVPB 2g/100 mL premix     2 g 200 mL/hr over 30 Minutes Intravenous Every 8 hours 08/12/19 1840 08/12/19 2218   08/12/19 1800  azithromycin (ZITHROMAX) 500 mg in sodium chloride 0.9 % 250 mL IVPB  Status:  Discontinued     500 mg 250 mL/hr over 60 Minutes Intravenous Every 24 hours 08/12/19 0709 08/13/19 1223   08/12/19 0800  Ampicillin-Sulbactam (UNASYN) 3 g in sodium chloride 0.9 % 100 mL IVPB  Status:  Discontinued     3 g 200 mL/hr  over 30 Minutes Intravenous Every 6 hours 08/12/19 0730 08/12/19 1840   08/12/19 0030  remdesivir 200 mg in sodium chloride 0.9% 250 mL IVPB     200 mg 580 mL/hr over 30 Minutes Intravenous Once 08/11/19 2347 08/12/19 0219   08/11/19 1700  cefTRIAXone (ROCEPHIN) 1 g in sodium chloride 0.9 % 100 mL IVPB     1 g 200 mL/hr over 30 Minutes Intravenous  Once 08/11/19 1649 08/11/19 1756   08/11/19 1700  azithromycin (ZITHROMAX) 500 mg in sodium chloride 0.9 % 250 mL IVPB     500 mg 250 mL/hr over 60 Minutes Intravenous  Once 08/11/19 1649 08/11/19 2054      Inpatient Medications  Scheduled Meds: . apixaban  10 mg Oral BID   Followed by  . [START ON 08/23/2019] apixaban  5 mg Oral BID  . carvedilol  3.125 mg Oral BID WC  . gabapentin  100 mg Oral TID  . insulin aspart  0-20 Units Subcutaneous TID WC  . insulin aspart  0-5 Units Subcutaneous QHS  . insulin aspart  6 Units Subcutaneous TID WC  . insulin glargine  32 Units Subcutaneous QHS  . pneumococcal 23 valent vaccine  0.5 mL Intramuscular Tomorrow-1000   Continuous Infusions: .  ceFAZolin (ANCEF) IV 2 g (08/18/19 0833)   PRN Meds:.acetaminophen **OR** acetaminophen, albuterol, chlorpheniramine-HYDROcodone, guaiFENesin-dextromethorphan, ondansetron **OR** ondansetron (ZOFRAN) IV, polyethylene glycol   Time Spent in minutes 25   See all Orders from today for further details   Jeoffrey Massed M.D on 08/18/2019 at 1:48 PM  To page go to [email or web address] - use universal password  [facility] -  Office  [phone]    Objective:   Vitals:   08/18/19 0359 08/18/19 0720 08/18/19 0800 08/18/19 1100  BP:  116/80 113/76 112/76  Pulse:  89 99 86  Resp:  15 16 17   Temp:  98 F (36.7 C) 98 F (36.7 C) 98.1 F (36.7 C)  TempSrc:  Oral Oral Oral  SpO2:  96% 92% 100%  Weight: 106.4 kg     Height:        Wt Readings from Last 3 Encounters:  08/18/19 106.4 kg  06/22/19 118.7 kg  12/02/16 124.4 kg     Intake/Output  Summary (Last 24 hours) at 08/18/2019 1348 Last data filed at 08/18/2019 0545 Gross per 24 hour  Intake 420 ml  Output 3050 ml  Net -2630 ml     Physical Exam Gen Exam:Alert awake-not in any distress HEENT:atraumatic, normocephalic Chest: B/L clear to auscultation anteriorly CVS:S1S2 regular Abdomen:soft non tender, non distended Extremities:no edema Neurology: Non focal Skin: no rash   Data Review:    CBC Recent Labs  Lab 08/12/19 0922 08/12/19 0922 08/13/19 1032 08/14/19 0641 08/15/19 0422 08/16/19 0412 08/17/19 0950  WBC 14.7*   < > 12.8* 13.2* 10.0 12.3* 13.8*  HGB 15.3   < > 14.8 14.4 13.8 14.2 15.2  HCT 48.4   < > 48.7 45.7 42.9 43.8 47.8  PLT 297   < > 375 400 392 420* 428*  MCV 85.1   < > 87.9 84.9 83.5 81.9 84.0  MCH 26.9   < > 26.7 26.8 26.8 26.5 26.7  MCHC 31.6   < > 30.4 31.5 32.2 32.4 31.8  RDW 16.3*   < > 16.3* 15.9* 15.5 15.2 15.3  LYMPHSABS 1.2  --  0.8 1.0 0.9 1.0  --   MONOABS 0.6  --  0.7 0.7 0.6 0.6  --   EOSABS 0.0  --  0.0 0.0 0.0 0.0  --   BASOSABS 0.0  --  0.0 0.0 0.0 0.0  --    < > = values in this interval not displayed.    Chemistries  Recent Labs  Lab 08/12/19 0922 08/12/19 0922 08/13/19 1032 08/13/19 1032 08/14/19 0641 08/15/19 0422 08/16/19 0412 08/17/19 0950 08/18/19 0606  NA 140   < > 142   < > 137 134* 135 132* 138  K 4.5   < > 5.1   < > 5.0 3.8 3.5 4.0 3.6  CL 107   < > 107   < > 99 97* 100 95* 97*  CO2 19*   < > 17*   < > 25 26 24 22 31   GLUCOSE 157*   < > 221*   < > 87 345* 243* 272* 75  BUN 14   < > 30*   < > 25* 31* 35* 31* 29*  CREATININE 1.53*   < > 1.83*   < > 1.36* 1.57* 1.50* 1.68* 1.47*  CALCIUM 8.8*   < > 8.6*   < > 8.5* 8.2* 8.2* 8.1* 8.3*  MG 1.9  --  2.2  --  2.1  --   --  2.0  --   AST 24   < > 81*  --  43* 26 24 28   --   ALT 18   < > 27  --  20 15 10 8   --   ALKPHOS 63   < > 75  --  93 58 58 65  --   BILITOT 1.6*   < >  1.4*  --  1.6* 0.8 0.7 0.8  --    < > = values in this interval not displayed.     ------------------------------------------------------------------------------------------------------------------ No results for input(s): CHOL, HDL, LDLCALC, TRIG, CHOLHDL, LDLDIRECT in the last 72 hours.  Lab Results  Component Value Date   HGBA1C 12.4 (H) 08/12/2019   ------------------------------------------------------------------------------------------------------------------ No results for input(s): TSH, T4TOTAL, T3FREE, THYROIDAB in the last 72 hours.  Invalid input(s): FREET3 ------------------------------------------------------------------------------------------------------------------ Recent Labs    08/16/19 0412  FERRITIN 627*    Coagulation profile Recent Labs  Lab 08/11/19 1614 08/12/19 0826  INR 1.7* 2.2*    Recent Labs    08/16/19 0412  DDIMER 3.94*    Cardiac Enzymes No results for input(s): CKMB, TROPONINI, MYOGLOBIN in the last 168 hours.  Invalid input(s): CK ------------------------------------------------------------------------------------------------------------------    Component Value Date/Time   BNP 701.0 (H) 08/13/2019 1032    Micro Results Recent Results (from the past 240 hour(s))  Blood Culture (routine x 2)     Status: Abnormal   Collection Time: 08/11/19  3:58 PM   Specimen: BLOOD RIGHT ARM  Result Value Ref Range Status   Specimen Description BLOOD RIGHT ARM  Final   Special Requests   Final    BOTTLES DRAWN AEROBIC AND ANAEROBIC Blood Culture adequate volume   Culture  Setup Time   Final    GRAM POSITIVE COCCI IN CLUSTERS ANAEROBIC BOTTLE ONLY CRITICAL RESULT CALLED TO, READ BACK BY AND VERIFIED WITHIhor Austin PHARMD 1826 08/12/19 A BROWNING Performed at [facility], [address] [place], [place] [address]    Culture STAPHYLOCOCCUS AUREUS (A)  Final   Report Status 08/14/2019 FINAL  Final   Organism ID, Bacteria STAPHYLOCOCCUS AUREUS  Final      Susceptibility   Staphylococcus aureus - MIC*     CIPROFLOXACIN <=0.5 SENSITIVE Sensitive     ERYTHROMYCIN >=8 RESISTANT Resistant     GENTAMICIN <=0.5 SENSITIVE Sensitive     OXACILLIN 0.5 SENSITIVE Sensitive     TETRACYCLINE <=1 SENSITIVE Sensitive     VANCOMYCIN 1 SENSITIVE Sensitive     TRIMETH/SULFA <=10 SENSITIVE Sensitive     CLINDAMYCIN <=0.25 SENSITIVE Sensitive     RIFAMPIN <=0.5 SENSITIVE Sensitive     Inducible Clindamycin NEGATIVE Sensitive     * STAPHYLOCOCCUS AUREUS  Blood Culture ID Panel (Reflexed)     Status: Abnormal   Collection Time: 08/11/19  3:58 PM  Result Value Ref Range Status   Enterococcus species NOT DETECTED NOT DETECTED Final   Listeria monocytogenes NOT DETECTED NOT DETECTED Final   Staphylococcus species DETECTED (A) NOT DETECTED Final    Comment: CRITICAL RESULT CALLED TO, READ BACK BY AND VERIFIED WITHIhor Austin PHARMD 1826 08/12/19 A BROWNING    Staphylococcus aureus (BCID) DETECTED (A) NOT DETECTED Final    Comment: Methicillin (oxacillin) susceptible Staphylococcus aureus (MSSA). Preferred therapy is anti staphylococcal beta lactam antibiotic (Cefazolin or Nafcillin), unless clinically contraindicated. CRITICAL RESULT CALLED TO, READ BACK BY AND VERIFIED WITH: Ihor Austin PHARMD 1826 08/12/19 A BROWNING    Methicillin resistance NOT DETECTED NOT DETECTED Final   Streptococcus species NOT DETECTED NOT DETECTED Final   Streptococcus agalactiae NOT DETECTED NOT DETECTED Final   Streptococcus pneumoniae NOT DETECTED NOT DETECTED Final   Streptococcus pyogenes NOT DETECTED NOT DETECTED Final   Acinetobacter baumannii NOT DETECTED NOT DETECTED Final   Enterobacteriaceae species NOT DETECTED NOT DETECTED Final   Enterobacter cloacae complex NOT DETECTED NOT DETECTED Final  Escherichia coli NOT DETECTED NOT DETECTED Final   Klebsiella oxytoca NOT DETECTED NOT DETECTED Final   Klebsiella pneumoniae NOT DETECTED NOT DETECTED Final   Proteus species NOT DETECTED NOT DETECTED Final   Serratia marcescens NOT  DETECTED NOT DETECTED Final   Haemophilus influenzae NOT DETECTED NOT DETECTED Final   Neisseria meningitidis NOT DETECTED NOT DETECTED Final   Pseudomonas aeruginosa NOT DETECTED NOT DETECTED Final   Candida albicans NOT DETECTED NOT DETECTED Final   Candida glabrata NOT DETECTED NOT DETECTED Final   Candida krusei NOT DETECTED NOT DETECTED Final   Candida parapsilosis NOT DETECTED NOT DETECTED Final   Candida tropicalis NOT DETECTED NOT DETECTED Final    Comment: Performed at [facility], [address], [place], [place] [address]  Urine culture     Status: None   Collection Time: 08/11/19  9:06 PM   Specimen: In/Out Cath Urine  Result Value Ref Range Status   Specimen Description IN/OUT CATH URINE  Final   Special Requests NONE  Final   Culture   Final    NO GROWTH Performed at [facility], [address], [place], [place] [address]    Report Status 08/14/2019 FINAL  Final  Culture, blood (routine x 2)     Status: None (Preliminary result)   Collection Time: 08/14/19  6:41 AM   Specimen: BLOOD  Result Value Ref Range Status   Specimen Description BLOOD RIGHT ANTECUBITAL  Final   Special Requests   Final    BOTTLES DRAWN AEROBIC AND ANAEROBIC Blood Culture results may not be optimal due to an inadequate volume of blood received in culture bottles   Culture   Final    NO GROWTH 4 DAYS Performed at [facility], [address], [place], [place] [address]    Report Status PENDING  Incomplete  Culture, blood (routine x 2)     Status: None (Preliminary result)   Collection Time: 08/14/19  6:41 AM   Specimen: BLOOD RIGHT HAND  Result Value Ref Range Status   Specimen Description BLOOD RIGHT HAND  Final   Special Requests   Final    BOTTLES DRAWN AEROBIC AND ANAEROBIC Blood Culture results may not be optimal due to an inadequate volume of blood received in culture bottles   Culture   Final    NO GROWTH 4 DAYS Performed at [facility], [address], [place], [place] [address]    Report Status PENDING  Incomplete    Radiology Reports CT Angio Chest PE W/Cm &/Or Wo Cm  Result Date: 08/11/2019 CLINICAL DATA:  Shortness of breath, hypoxia, hemoptysis, recent COVID-19 diagnosis 6 days prior EXAM: CT ANGIOGRAPHY CHEST WITH CONTRAST TECHNIQUE: Multidetector CT imaging of the chest was performed using the standard protocol during bolus administration of intravenous contrast. Multiplanar CT image reconstructions and MIPs were obtained to evaluate the vascular anatomy. CONTRAST:  OMNIPAQUE IOHEXOL 350 MG/ML SOLN COMPARISON:  Same-day radiograph FINDINGS: Cardiovascular: Satisfactory opacification the pulmonary arteries to the segmental level. Evaluation beyond the lobar level is limited due to extensive respiratory motion artifact. However, there is suspected filling defect within the segmental and subsegmental pulmonary arteries of the posterior right lower lobe extending to the ostium of the right upper lobar pulmonary artery. Finding is best seen on coronal MIP imaging series 14, image [id number]. Several of the pulmonary arteries in the region of the superior segment right lower lobe and right middle lobe appear narrowed as the  past within the perihilar density detailed below. Central pulmonary arteries are normal caliber. Slight reflux of contrast into the IVC and hepatic veins. Without other convincing CT evidence of right heart strain. Borderline cardiomegaly without pericardial effusion. Atherosclerotic plaque within the normal caliber aorta. Shared origin of the left common carotid and brachiocephalic artery. Minimal plaque in the proximal great vessels. Mediastinum/Nodes: Multiple subcentimeter mediastinal and hilar nodes are present. More confluent soft tissue opacity extending within the subcarinal space in into the right hilum (9/44-48). Mild focal narrowing of the right mainstem bronchus and bronchus intermedius in this region of soft tissue  attenuation. Possibly reflecting conglomerate nodal tissue versus direct extension of a mass. Tiny diverticulum seen arising from the inferomedial aspect of the left mainstem bronchus. No other acute abnormality of the central airways. Difficult to discern the margins of the distal thoracic esophagus from the soft tissue in the subcarinal region. Thyroid gland and thoracic inlet are unremarkable. Lungs/Pleura: Dense masslike consolidation measuring approximately 7.7 x 8.2 cm (11/35) is seen in the posterior segment right upper lobe abutting the major fissure with confluent soft tissue attenuation extending into the right hilum and subcarinal region which is highly worrisome for an underlying mass lesion particularly given narrowing of the airways and pulmonary arteries in this location. However, there are additional peripheral tree-in-bud opacities and multifocal mixed ground-glass and consolidation elsewhere throughout the lungs. No visible pneumothorax or effusion. Background of centrilobular emphysema is noted towards the apices. Upper Abdomen: No acute abnormalities present in the visualized portions of the upper abdomen. Musculoskeletal: No acute osseous abnormality or suspicious osseous lesion. Multilevel degenerative changes are present in the imaged portions of the spine. Additional degenerative changes at the left sternoclavicular joint and bilateral shoulders. Review of the MIP images confirms the above findings. IMPRESSION: 1. Segmental and subsegmental pulmonary artery emboli in the pulmonary arteries of the posterior segment right upper lobe extending towards the ostial origin from the left upper lobar pulmonary artery. No CT evidence of right heart strain. 2. Dense masslike consolidation measuring approximately 7.7 x 8.2 cm in the posterior segment right upper lobe abutting the major fissure with confluent soft tissue attenuation extending into the right hilum and subcarinal region which is highly  worrisome for an underlying mass lesion particularly given narrowing of the airways and pulmonary arteries in this location. While dense infectious consolidation or large pulmonary infarct could have a similar appearance high suspicion should be maintained with continued follow-up imaging following acute intervention. Surrounding areas of ground-glass and tree-in-bud nodularity could reflect some postobstructive pneumonia given truncation the airways entering this region. 3. Additional multifocal areas of mixed consolidation and ground-glass opacity compatible with atypical pneumonia in the setting of known COVID-19 positivity. 4. Additional subcentimeter mediastinal and hilar nodes, likely reactive though metastatic disease is not excluded. 5. Aortic Atherosclerosis (ICD10-I70.0). These results were called by telephone at the time of interpretation on 08/11/2019 at 10:48 pm to provider GARRETT GREEN , who verbally acknowledged these results. Electronically Signed   By: Kreg Shropshire M.D.   On: 08/11/2019 22:49   DG Chest Port 1 View  Result Date: 08/11/2019 CLINICAL DATA:  Fall shortness of breath, hypoxia, COVID-19 positive EXAM: PORTABLE CHEST 1 VIEW COMPARISON:  Radiograph 10/24/2013 FINDINGS: Focal opacity is seen in the right mid lung, likely in the upper lobe. More mixed patchy and interstitial opacities are seen in the lung bases and periphery. No pneumothorax or visible effusion. Mild cardiomegaly though possibly accentuated by portable technique. Remaining cardiomediastinal contours are  unremarkable. No acute osseous or soft tissue abnormality. Telemetry leads overlie the chest. IMPRESSION: 1. Focal opacity in the right mid lung, likely in the upper lobe, suspicious for pneumonia. 2. Additional patchy and interstitial opacities in the lung bases and periphery may reflect additional areas of infection in the setting of COVID-19. Electronically Signed   By: Kreg Shropshire M.D.   On: 08/11/2019 15:38   DG  Foot Complete Left  Result Date: 08/14/2019 CLINICAL DATA:  Left foot pain, bacteremia EXAM: LEFT FOOT - COMPLETE 3+ VIEW COMPARISON:  None. FINDINGS: No acute fracture. No dislocation. Mild first MTP joint space narrowing. Well defined marginal lucencies with sclerotic margins at the medial aspect of the left first metatarsal head, which can be seen in the setting of a crystalline arthropathy such as gout. Small plantar calcaneal spur. There is soft tissue swelling over the dorsum of the forefoot. IMPRESSION: 1. Soft tissue swelling over the dorsum of the forefoot without underlying fracture or dislocation. 2. Small well defined marginal lucencies at the medial aspect of the left first metatarsal head, which may reflect sequela of gout. Electronically Signed   By: Duanne Guess D.O.   On: 08/14/2019 17:10   DG Foot Complete Right  Result Date: 08/14/2019 CLINICAL DATA:  Bilateral foot pain for 2 months. Bacteremia EXAM: RIGHT FOOT COMPLETE - 3+ VIEW COMPARISON:  04/20/2008 FINDINGS: Postsurgical changes related to ORIF of the first through fourth tarsometatarsal joints. Hardware appears intact without perihardware lucency or fracture. 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 No acute osseous abnormality. No erosion or cortical destruction. Soft tissues are within normal limits. IMPRESSION: 1. No acute osseous abnormality. 2. Postsurgical changes related to ORIF of the first through fourth tarsometatarsal joints. No evidence for hardware complication. Electronically Signed   By: Duanne Guess D.O.   On: 08/14/2019 17:08   ECHOCARDIOGRAM COMPLETE  Result Date: 08/12/2019    ECHOCARDIOGRAM REPORT   Patient Name:   Kevin Long Date of Exam: 08/12/2019 Medical Rec #:  [id number]     Height:       66.0 in Accession #:    [id number]    Weight:       232.8 lb Date of Birth:  [birth year]     BSA:          2.133 m Patient Age:    58 years      BP:           90/75 mmHg Patient Gender: M             HR:            98 bpm. Exam Location:  Inpatient Procedure: 2D Echo Indications:    415.19 pulmonary embolus  History:        Patient has prior history of Echocardiogram examinations, most                 recent 06/26/2019. Cardiomyopathy; Risk Factors:Hypertension,                 Diabetes, Dyslipidemia and Former Smoker.  Sonographer:    Celene Skeen RDCS (AE) Referring Phys: [id number] Deno Lunger [facility]  Sonographer Comments: Suboptimal apical window. Image acquisition challenging due to patient body habitus and Image acquisition challenging due to respiratory motion. very limited mobility IMPRESSIONS  1. Left ventricular ejection fraction, by estimation, is <20%. The left ventricle has severely decreased function. The left ventricle demonstrates global hypokinesis. The left ventricular internal cavity size was moderately dilated. Left ventricular  diastolic function could not be evaluated.  2. Right ventricular systolic function is severely reduced. The right ventricular size is moderately enlarged. Tricuspid regurgitation signal is inadequate for assessing PA pressure.  3. Left atrial size was mild to moderately dilated.  4. Right atrial size was mildly dilated.  5. The mitral valve is degenerative. Trivial mitral valve regurgitation. No evidence of mitral stenosis.  6. The aortic valve is tricuspid. Aortic valve regurgitation is not visualized. Mild aortic valve sclerosis is present, with no evidence of aortic valve stenosis.  7. The inferior vena cava is dilated in size with <50% respiratory variability, suggesting right atrial pressure of 15 mmHg. Conclusion(s)/Recommendation(s): Probable moderate sized thrombus in LV apex. Recommend definity contrast to further evaulated extent. FINDINGS  Left Ventricle: Left ventricular ejection fraction, by estimation, is <20%. The left ventricle has severely decreased function. The left ventricle demonstrates global hypokinesis. The left ventricular internal cavity size was  moderately dilated. There is no left ventricular hypertrophy. Left ventricular diastolic function could not be evaluated. Right Ventricle: The right ventricular size is moderately enlarged. No increase in right ventricular wall thickness. Right ventricular systolic function is severely reduced. Tricuspid regurgitation signal is inadequate for assessing PA pressure. Left Atrium: Left atrial size was mild to moderately dilated. Right Atrium: Right atrial size was mildly dilated. Pericardium: There is no evidence of pericardial effusion. Mitral Valve: The mitral valve is degenerative in appearance. There is mild thickening of the mitral valve leaflet(s). Normal mobility of the mitral valve leaflets. Mild to moderate mitral annular calcification. Trivial mitral valve regurgitation. No evidence of mitral valve stenosis. Tricuspid Valve: The tricuspid valve is normal in structure. Tricuspid valve regurgitation is trivial. No evidence of tricuspid stenosis. Aortic Valve: The aortic valve is tricuspid. Aortic valve regurgitation is not visualized. Mild aortic valve sclerosis is present, with no evidence of aortic valve stenosis. Pulmonic Valve: The pulmonic valve was not well visualized. Pulmonic valve regurgitation is trivial. No evidence of pulmonic stenosis. Aorta: The aortic root is normal in size and structure. Venous: The inferior vena cava is dilated in size with less than 50% respiratory variability, suggesting right atrial pressure of 15 mmHg. IAS/Shunts: No atrial level shunt detected by color flow Doppler.  LEFT VENTRICLE PLAX 2D LVIDd:         6.10 cm LVIDs:         5.60 cm LV PW:         1.20 cm LV IVS:        0.90 cm LVOT diam:     2.30 cm LV SV:         38 LV SV Index:   18 LVOT Area:     4.15 cm  RIGHT VENTRICLE TAPSE (M-mode): 1.1 cm LEFT ATRIUM           Index       RIGHT ATRIUM           Index LA diam:      3.50 cm 1.64 cm/m  RA Area:     19.40 cm LA Vol (A2C): 92.6 ml 43.41 ml/m RA Volume:   58.70 ml   27.52 ml/m LA Vol (A4C): 21.9 ml 10.27 ml/m  AORTIC VALVE LVOT Vmax:   62.70 cm/s LVOT Vmean:  46.100 cm/s LVOT VTI:    0.090 m  AORTA Ao Root diam: 3.60 cm  SHUNTS Systemic VTI:  0.09 m Systemic Diam: 2.30 cm Fransico Him MD Electronically signed by Fransico Him MD Signature Date/Time: 08/12/2019/2:29:09 PM  Final    VAS [place] LOWER EXTREMITY VENOUS (DVT)  Result Date: 08/15/2019  Lower Venous DVTStudy Indications: COVID-19 positive, Pain, and Edema.  Comparison Study: No prior study Performing Technologist: Gertie Fey MHA, RDMS, RVT, RDCS  Examination Guidelines: A complete evaluation includes B-mode imaging, spectral Doppler, color Doppler, and power Doppler as needed of all accessible portions of each vessel. Bilateral testing is considered an integral part of a complete examination. Limited examinations for reoccurring indications may be performed as noted. The reflux portion of the exam is performed with the patient in reverse Trendelenburg.  +---------+---------------+---------+-----------+----------+--------------+ RIGHT    CompressibilityPhasicitySpontaneityPropertiesThrombus Aging +---------+---------------+---------+-----------+----------+--------------+ CFV      Full           Yes      Yes                                 +---------+---------------+---------+-----------+----------+--------------+ SFJ      Full                                                        +---------+---------------+---------+-----------+----------+--------------+ FV Prox  Full                                                        +---------+---------------+---------+-----------+----------+--------------+ FV Mid   Full                                                        +---------+---------------+---------+-----------+----------+--------------+ FV DistalFull                                                         +---------+---------------+---------+-----------+----------+--------------+ PFV      Full                                                        +---------+---------------+---------+-----------+----------+--------------+ POP      Full           Yes      Yes                                 +---------+---------------+---------+-----------+----------+--------------+ PTV      Full                                                        +---------+---------------+---------+-----------+----------+--------------+ PERO     Full                                                        +---------+---------------+---------+-----------+----------+--------------+   +---------+---------------+---------+-----------+----------+--------------+  LEFT     CompressibilityPhasicitySpontaneityPropertiesThrombus Aging +---------+---------------+---------+-----------+----------+--------------+ CFV      Full           Yes      Yes                                 +---------+---------------+---------+-----------+----------+--------------+ SFJ      Full                                                        +---------+---------------+---------+-----------+----------+--------------+ FV Prox  Full                                                        +---------+---------------+---------+-----------+----------+--------------+ FV Mid   Full                                                        +---------+---------------+---------+-----------+----------+--------------+ FV DistalFull                                                        +---------+---------------+---------+-----------+----------+--------------+ PFV      Full                                                        +---------+---------------+---------+-----------+----------+--------------+ POP      Full           Yes      Yes                                  +---------+---------------+---------+-----------+----------+--------------+ PTV      Full                                                        +---------+---------------+---------+-----------+----------+--------------+ PERO     Full                                                        +---------+---------------+---------+-----------+----------+--------------+     Summary: RIGHT: - There is no evidence of deep vein thrombosis in the lower extremity.  - No cystic structure found in the popliteal fossa.  LEFT: - There is no evidence of deep vein thrombosis in the lower extremity.  - No  cystic structure found in the popliteal fossa.  *See table(s) above for measurements and observations. Electronically signed by Sherald Hess MD on 08/15/2019 at 5:46:23 PM.    Final    [place] EKG SITE RITE  Result Date: 08/13/2019 If Site Rite image not attached, placement could not be confirmed due to current cardiac rhythm.  US Abdomen Limited RUQ  Result Date: 08/12/2019 CLINICAL DATA:  Abnormal LFTs EXAM: ULTRASOUND ABDOMEN LIMITED RIGHT UPPER QUADRANT COMPARISON:  None. FINDINGS: Gallbladder: The gallbladder appears to be filled with slightly a hyperechoic sludge. No sonographic Murphy sign. The gallbladder wall thickness measures 3 mm. No pericholecystic fluid. Common bile duct: Diameter: 5 mm Liver: No focal lesion identified. Within normal limits in parenchymal echogenicity. Portal vein is patent on color Doppler imaging with normal direction of blood flow towards the liver. Other: None. IMPRESSION: Gallbladder sludge.  No definite evidence of cholecystitis. Electronically Signed   By: Jonna Clark M.D.   On: 08/12/2019 01:58

## 2019-08-18 NOTE — Progress Notes (Signed)
Peripherally Inserted Central Catheter Placement  The IV Nurse has discussed with the patient and/or persons authorized to consent for the patient, the purpose of this procedure and the potential benefits and risks involved with this procedure.  The benefits include less needle sticks, lab draws from the catheter, and the patient may be discharged home with the catheter. Risks include, but not limited to, infection, bleeding, blood clot (thrombus formation), and puncture of an artery; nerve damage and irregular heartbeat and possibility to perform a PICC exchange if needed/ordered by physician.  Alternatives to this procedure were also discussed.  Bard Power PICC patient education guide, fact sheet on infection prevention and patient information card has been provided to patient /or left at bedside.    PICC Placement Documentation  PICC Single Lumen 08/18/19 PICC Right Brachial 40 cm 0 cm (Active)  Indication for Insertion or Continuance of Line Home intravenous therapies (PICC only) 08/18/19 2233  Exposed Catheter (cm) 0 cm 08/18/19 2233  Site Assessment Clean;Dry;Intact 08/18/19 2233  Line Status Flushed;Saline locked;Blood return noted 08/18/19 2233  Dressing Type Transparent 08/18/19 2233  Dressing Status Clean;Intact;Dry;Antimicrobial disc in place 08/18/19 2233  Dressing Intervention New dressing 08/18/19 2233  Dressing Change Due 08/25/19 08/18/19 2233       Ethelda Chick 08/18/2019, 10:35 PM

## 2019-08-18 NOTE — Progress Notes (Signed)
CCMD notified of a 5 beat run of Vtach at 140 bpm.  Resolved and pt not asymptomatic.  Notified MD via page.

## 2019-08-18 NOTE — Progress Notes (Signed)
Pharmacy Antibiotic Note  Kevin Long is a 59 y.o. male admitted on 08/11/2019 with pneumonia.  Pharmacy consulted 5/14 to change abx to Cefazolin for MSSA bacteremia.  4/29>> COVID-19 positive at [facility] (results available in care everywhere).  Remdesivir 5/14>>5/18.  Pt is currently on D7 of cefazolin for his complicated MSSA bacteremia. He is scheduled for a TEE today. He will on therapy for at least 4 wks depending on the TEE. Scr has been hovering around 1.5.    Remdesivir 5/14>5/18 Steroids: 5/13>> Unasyn 5/14 Azithro 5/13>5/15 Rocephin 5/13 x 1 Ancef 5/14 >>  Plan: Continue Cefazolin 2 g IV q8 hours Monitor clinical status, renal function and culture results daily.    Height: 5\' 6"  (167.6 cm) Weight: 106.4 kg (234 lb 9.1 oz) IBW/kg (Calculated) : 63.8  Temp (24hrs), Avg:98.2 F (36.8 C), Min:97.9 F (36.6 C), Max:98.8 F (37.1 C)  Recent Labs  Lab 08/11/19 1614 08/11/19 1855 08/12/19 0106 08/12/19 0826 08/12/19 0922 08/13/19 1032 08/13/19 1032 08/14/19 0641 08/15/19 0422 08/16/19 0412 08/17/19 0950 08/18/19 0606  WBC 19.9*  --   --   --    < > 12.8*  --  13.2* 10.0 12.3* 13.8*  --   CREATININE 1.65*  --   --   --    < > 1.83*   < > 1.36* 1.57* 1.50* 1.68* 1.47*  LATICACIDVEN 4.1* 4.4* 7.9* 3.8*  --   --   --   --   --   --   --   --    < > = values in this interval not displayed.    Estimated Creatinine Clearance: 61.1 mL/min (A) (by C-G formula based on SCr of 1.47 mg/dL (H)).    Allergies  Allergen Reactions  . Celebrex [Celecoxib] Swelling    Swelling in face  . Entresto [Sacubitril-Valsartan] Other (See Comments)    Dizziness    08/20/19, PharmD, BCIDP, AAHIVP, CPP Infectious Disease Pharmacist 08/18/2019 10:33 AM

## 2019-08-18 NOTE — Progress Notes (Signed)
Physical Therapy Treatment Patient Details Name: Kevin Long MRN: [id number] DOB: 11-Nov-1960 Today's Date: 08/18/2019    History of Present Illness  59 y.o. male with PMHx of chronic systolic heart failure, HTN, DM-2-, OSA noncompliant to CPAP who presented with shortness of breath-found to have acute hypoxic respiratory failure secondary to PE, COVID-19 pneumonia, lung mass, decompensated heart failure (EF <20%), and possible bacterial pneumonia.  Post admission-blood cultures positive for MSSA. Encephalopathy    PT Comments    Pt was able to do more with me today with RW and min assist overall.  His feet seem much less painful.  Cognition still does not seem normal despite orientation x 4.  PT will continue to follow acutely for safe mobility progression.  Follow Up Recommendations  CIR     Equipment Recommendations  3in1 (PT);Rolling walker with 5" wheels;Wheelchair (measurements PT);Wheelchair cushion (measurements PT)    Recommendations for Other Services Rehab consult     Precautions / Restrictions Precautions Precautions: Fall;Other (comment) Precaution Comments: confusion    Mobility  Bed Mobility Overal bed mobility: Needs Assistance Bed Mobility: Supine to Sit Rolling: Supervision   Supine to sit: Supervision;HOB elevated     General bed mobility comments: supervision for safety, heavy use of rail, HOB 30 degrees  Transfers Overall transfer level: Needs assistance Equipment used: Rolling walker (2 wheeled) Transfers: Sit to/from [employer] Sit to Stand: Min assist;From elevated surface Stand pivot transfers: Min assist;From elevated surface       General transfer comment: Min assist to stand from elevated bed and elevated BSC.  Assist for balance and stabilizing RW, RW adjusted up for better fit.   Ambulation/Gait Ambulation/Gait assistance: Min assist Gait Distance (Feet): 8 Feet Assistive device: Rolling walker (2 wheeled) Gait  Pattern/deviations: Step-through pattern;Shuffle;Staggering right;Staggering left     General Gait Details: Pt with shuffling, staggering gait pattern, seems less painful over feet, able to do more with me today.            Balance Overall balance assessment: Needs assistance Sitting-balance support: Feet supported;Bilateral upper extremity supported Sitting balance-Leahy Scale: Good     Standing balance support: Bilateral upper extremity supported Standing balance-Leahy Scale: Poor Standing balance comment: Needed therapist support and external support from RW.  Able to stand and preform peri care with one hand supoorted on RW and min guard assist from therapist.                             Cognition Arousal/Alertness: Awake/alert Behavior During Therapy: WFL for tasks assessed/performed Overall Cognitive Status: No family/caregiver present to determine baseline cognitive functioning                                 General Comments: Continues to say things that are out of context to conversation         General Comments General comments (skin integrity, edema, etc.): VSS throughout on RA      Pertinent Vitals/Pain Pain Assessment: No/denies pain Pain Score: 0-No pain       Prior Function            PT Goals (current goals can now be found in the care plan section) Acute Rehab PT Goals Patient Stated Goal: Go home Progress towards PT goals: Progressing toward goals    Frequency    Min 3X/week      PT Plan  Current plan remains appropriate       AM-PAC PT "6 Clicks" Mobility   Outcome Measure  Help needed turning from your back to your side while in a flat bed without using bedrails?: A Little Help needed moving from lying on your back to sitting on the side of a flat bed without using bedrails?: A Little Help needed moving to and from a bed to a chair (including a wheelchair)?: A Little Help needed standing up from a chair  using your arms (e.g., wheelchair or bedside chair)?: A Little Help needed to walk in hospital room?: A Little Help needed climbing 3-5 steps with a railing? : A Lot 6 Click Score: 17    End of Session Equipment Utilized During Treatment: Gait belt Activity Tolerance: Patient tolerated treatment well Patient left: in chair;with call bell/phone within reach Nurse Communication: Other (comment)(condom cath fell off to RN tech) PT Visit Diagnosis: Muscle weakness (generalized) (M62.81);Difficulty in walking, not elsewhere classified (R26.2)     Time: 0277-4128 PT Time Calculation (min) (ACUTE ONLY): 44 min  Charges:  $Therapeutic Activity: 38-52 mins                    Verdene Lennert, PT, DPT  Acute Rehabilitation [phone] pager #[phone] office     08/18/2019, 12:42 PM

## 2019-08-19 ENCOUNTER — Encounter (HOSPITAL_COMMUNITY): Payer: Self-pay | Admitting: Internal Medicine

## 2019-08-19 DIAGNOSIS — J15211 Pneumonia due to Methicillin susceptible Staphylococcus aureus: Secondary | ICD-10-CM

## 2019-08-19 DIAGNOSIS — R945 Abnormal results of liver function studies: Secondary | ICD-10-CM

## 2019-08-19 DIAGNOSIS — B9561 Methicillin susceptible Staphylococcus aureus infection as the cause of diseases classified elsewhere: Secondary | ICD-10-CM

## 2019-08-19 DIAGNOSIS — R7881 Bacteremia: Secondary | ICD-10-CM

## 2019-08-19 LAB — BASIC METABOLIC PANEL
Anion gap: 8 (ref 5–15)
BUN: 22 mg/dL — ABNORMAL HIGH (ref 6–20)
CO2: 30 mmol/L (ref 22–32)
Calcium: 8.1 mg/dL — ABNORMAL LOW (ref 8.9–10.3)
Chloride: 96 mmol/L — ABNORMAL LOW (ref 98–111)
Creatinine, Ser: 1.17 mg/dL (ref 0.61–1.24)
GFR calc Af Amer: >60 mL/min (ref >60)
GFR calc non Af Amer: >60 mL/min (ref >60)
Glucose, Bld: 49 mg/dL — ABNORMAL LOW (ref 70–99)
Potassium: 3.4 mmol/L — ABNORMAL LOW (ref 3.5–5.1)
Sodium: 134 mmol/L — ABNORMAL LOW (ref 135–145)

## 2019-08-19 LAB — CULTURE, BLOOD (ROUTINE X 2)
Culture: NO GROWTH
Culture: NO GROWTH

## 2019-08-19 LAB — CBC
HCT: 48.2 % (ref 39.0–52.0)
Hemoglobin: 15.7 g/dL (ref 13.0–17.0)
MCH: 27.2 pg (ref 26.0–34.0)
MCHC: 32.6 g/dL (ref 30.0–36.0)
MCV: 83.4 fL (ref 80.0–100.0)
Platelets: 437 10*3/uL — ABNORMAL HIGH (ref 150–400)
RBC: 5.78 MIL/uL (ref 4.22–5.81)
RDW: 15.6 % — ABNORMAL HIGH (ref 11.5–15.5)
WBC: 15.4 10*3/uL — ABNORMAL HIGH (ref 4.0–10.5)
nRBC: 0.1 % (ref 0.0–0.2)

## 2019-08-19 LAB — GLUCOSE, CAPILLARY
Glucose-Capillary: 81 mg/dL (ref 70–99)
Glucose-Capillary: 250 mg/dL — ABNORMAL HIGH (ref 70–99)

## 2019-08-19 LAB — MAGNESIUM: Magnesium: 2.1 mg/dL (ref 1.7–2.4)

## 2019-08-19 MED ORDER — LANTUS SOLOSTAR 100 UNIT/ML ~~LOC~~ SOPN: 28.0000 [IU] | PEN_INJECTOR | Freq: Every day | SUBCUTANEOUS | 11 refills | Status: DC

## 2019-08-19 MED ORDER — TORSEMIDE 20 MG PO TABS
20.0000 mg | ORAL_TABLET | Freq: Every day | ORAL | Status: DC
  Administered 2019-08-19: 20 mg via ORAL
  Filled 2019-08-19: qty 1

## 2019-08-19 MED ORDER — POTASSIUM CHLORIDE CRYS ER 20 MEQ PO TBCR
40.0000 meq | EXTENDED_RELEASE_TABLET | ORAL | Status: AC
  Administered 2019-08-19 (×2): 40 meq via ORAL
  Filled 2019-08-19 (×2): qty 2

## 2019-08-19 MED ORDER — CARVEDILOL 6.25 MG PO TABS: 6.2500 mg | ORAL_TABLET | Freq: Two times a day (BID) | ORAL | 0 refills | Status: DC

## 2019-08-19 MED ORDER — POTASSIUM CHLORIDE CRYS ER 20 MEQ PO TBCR: 40.0000 meq | EXTENDED_RELEASE_TABLET | Freq: Every day | ORAL | Status: DC

## 2019-08-19 MED ORDER — NOVOLOG FLEXPEN 100 UNIT/ML ~~LOC~~ SOPN: PEN_INJECTOR | SUBCUTANEOUS | 0 refills | Status: DC

## 2019-08-19 MED ORDER — CEFAZOLIN SODIUM-DEXTROSE 2-4 GM/100ML-% IV SOLN
2.0000 g | Freq: Three times a day (TID) | INTRAVENOUS | Status: DC
  Administered 2019-08-19: 2 g via INTRAVENOUS
  Filled 2019-08-19 (×3): qty 100

## 2019-08-19 MED ORDER — GABAPENTIN 100 MG PO CAPS: 100.0000 mg | ORAL_CAPSULE | Freq: Three times a day (TID) | ORAL | 0 refills | Status: DC

## 2019-08-19 MED ORDER — APIXABAN (ELIQUIS) VTE STARTER PACK (10MG AND 5MG)
ORAL_TABLET | ORAL | 0 refills | Status: DC
Start: 2019-08-19 — End: 2020-09-21

## 2019-08-19 MED ORDER — CEFAZOLIN IV (FOR PTA / DISCHARGE USE ONLY)
2.0000 g | Freq: Three times a day (TID) | INTRAVENOUS | 0 refills | Status: AC
Start: 2019-08-19 — End: 2019-09-23

## 2019-08-19 MED ORDER — HEPARIN SOD (PORK) LOCK FLUSH 100 UNIT/ML IV SOLN
250.0000 [IU] | INTRAVENOUS | Status: AC | PRN
  Administered 2019-08-19: 250 [IU]
  Filled 2019-08-19: qty 3

## 2019-08-19 MED FILL — CARVEDILOL 6.25 MG TABLET: 6.25 | 30 days supply | Qty: 60 | Fill #0

## 2019-08-19 MED FILL — ELIQUIS STARTER PACK 5 MG T: 5 | 30 days supply | Qty: 74 | Fill #0

## 2019-08-19 MED FILL — GABAPENTIN 100 MG CAPSULE: 100 | 30 days supply | Qty: 90 | Fill #0

## 2019-08-19 NOTE — Progress Notes (Signed)
[facility] for Infectious Disease    Date of Admission:  08/11/2019   Total days of antibiotics 9/day 7 cefazolin           ID: Kevin Long is a 59 y.o. male with  Principal Problem:   Sepsis due to COVID-19 [facility]) Active Problems:   Essential hypertension   Chronic combined systolic and diastolic congestive heart failure ([place])   Uncontrolled type 2 diabetes mellitus with hyperglycemia, with long-term current use of insulin (HCC)   Acute metabolic encephalopathy   Acute respiratory failure with hypoxemia (HCC)   Lactic acidosis   Acute pulmonary embolism without acute cor pulmonale (HCC)   AKI (acute kidney injury) ([place])   Mass of upper lobe of right lung    Subjective: Afebrile, had picc line placed yesterday without difficulty. Only slight cough. Looking forward to getting home. Unable to get TEE since he had breakfast this morning.  Medications:  . apixaban  10 mg Oral BID   Followed by  . [START ON 08/23/2019] apixaban  5 mg Oral BID  . carvedilol  3.125 mg Oral BID WC  . Chlorhexidine Gluconate Cloth  6 each Topical Daily  . gabapentin  100 mg Oral TID  . insulin aspart  0-20 Units Subcutaneous TID WC  . insulin aspart  0-5 Units Subcutaneous QHS  . insulin aspart  6 Units Subcutaneous TID WC  . insulin glargine  28 Units Subcutaneous QHS  . pneumococcal 23 valent vaccine  0.5 mL Intramuscular Tomorrow-1000  . sodium chloride flush  10-40 mL Intracatheter Q12H  . torsemide  20 mg Oral Daily    Objective: Vital signs in last 24 hours: Temp:  [97.6 F (36.4 C)-98.7 F (37.1 C)] 97.9 F (36.6 C) (05/21 0828) Pulse Rate:  [82-96] 92 (05/21 0828) Resp:  [15-19] 16 (05/21 0400) BP: (112-122)/(74-100) 115/100 (05/21 0828) SpO2:  [92 %-100 %] 93 % (05/21 0400) Physical Exam  Constitutional: He is oriented to person, place, and time. He appears well-developed and well-nourished. No distress.  HENT:  Mouth/Throat: Oropharynx is clear and moist. No  oropharyngeal exudate.  Cardiovascular: Normal rate, regular rhythm and normal heart sounds. Exam reveals no gallop and no friction rub.  No murmur heard.  Pulmonary/Chest: Effort normal and breath sounds normal. No respiratory distress. He has no wheezes.  Abdominal: Soft. Bowel sounds are normal. He exhibits no distension. There is no tenderness.  Lymphadenopathy:  He has no cervical adenopathy.  FIE:PPIRJ arm picc line is c/d/i Neurological: He is alert and oriented to person, place, and time.  Skin: Skin is warm and dry. No rash noted. No erythema.  Psychiatric: He has a normal mood and affect. His behavior is normal.     Lab Results Recent Labs    08/17/19 0950 08/17/19 0950 08/18/19 0606 08/19/19 0330  WBC 13.8*  --   --  15.4*  HGB 15.2  --   --  15.7  HCT 47.8  --   --  48.2  NA 132*   < > 138 134*  K 4.0   < > 3.6 3.4*  CL 95*   < > 97* 96*  CO2 22   < > 31 30  BUN 31*   < > 29* 22*  CREATININE 1.68*   < > 1.47* 1.17   < > = values in this interval not displayed.   Liver Panel Recent Labs    08/17/19 0950  PROT 6.1*  ALBUMIN 2.2*  AST 28  ALT 8  ALKPHOS 65  BILITOT 0.8   Sedimentation Rate No results for input(s): ESRSEDRATE in the last 72 hours. C-Reactive Protein Recent Labs    08/17/19 0950  CRP 1.9*    Microbiology: 5/16 blood cx ngtd Studies/Results: [place] EKG SITE RITE  Result Date: 08/18/2019 If Site Rite image not attached, placement could not be confirmed due to current cardiac rhythm.    Assessment/Plan: Kevin Long is a 59 y.o. male with covid-19 c/b PE, post-obstructive pneumonia, pulmonary abscess with mssa bacteremia   Plan to treat as complicated bacteremia, unable to rule out endocarditis at this time. Would like to treat for 6 wk, and we can decrease to 4 wk if his TEE is negative.  Will have him come back to ID clinic in 4 wk.  Diagnosis: Bacteremia, lung abscess  Culture Result: MSSA  Allergies  Allergen Reactions    . Celebrex [Celecoxib] Swelling    Swelling in face  . Entresto [Sacubitril-Valsartan] Other (See Comments)    Dizziness    OPAT Orders Discharge antibiotics to be given via PICC line Discharge antibiotics: Per pharmacy protocol  Cefazolin 2gm IV Q 8hr Aim for Vancomycin trough 15-20 or AUC 400-550 (unless otherwise indicated) Duration: 6 wk End Date: June 27th   [facility] Care Per Protocol:  Home health RN for IV administration and teaching; PICC line care and labs.    Labs weekly while on IV antibiotics: _x_ CBC with differential __ BMP _x_ CMP  _x_ Please pull PIC at completion of IV antibiotics   Fax weekly labs to [phone]  Clinic Follow Up Appt: 4 wk  @ [facility]    [facility] for Infectious Diseases Cell: [phone] Pager: [phone]  08/19/2019, 12:42 PM

## 2019-08-19 NOTE — Progress Notes (Addendum)
PHARMACY CONSULT NOTE FOR:  OUTPATIENT  PARENTERAL ANTIBIOTIC THERAPY (OPAT)  Indication: MSSA bacteremia / post-obstructive PNA Regimen: Cefazolin 2g IV q8h End date: 09/23/2019  IV antibiotic discharge orders are pended. To discharging provider:  please sign these orders via discharge navigator,  Select New Orders & click on the button choice - Manage This Unsigned Work.     Thank you for allowing pharmacy to be a part of this patient's care.  Cherlyn Cushing, PharmD 08/19/2019, 10:03 AM

## 2019-08-19 NOTE — Progress Notes (Signed)
Inpatient Rehab Admissions:  Inpatient Rehab Consult received.  I met with patient at the bedside for rehabilitation assessment and to discuss goals and expectations of an inpatient rehab admission.  Confirmed he is planning to discharge home with support from his wife.  CIR will sign off at this time.   Signed: Shann Medal, PT, DPT Admissions Coordinator [phone] 08/19/19  12:41 PM

## 2019-08-19 NOTE — Progress Notes (Signed)
Inpatient Diabetes Program Recommendations  AACE/ADA: New Consensus Statement on Inpatient Glycemic Control (2015)  Target Ranges:  Prepandial:   less than 140 mg/dL      Peak postprandial:   less than 180 mg/dL (1-2 hours)      Critically ill patients:  140 - 180 mg/dL   Lab Results  Component Value Date   GLUCAP 81 08/19/2019   HGBA1C 12.4 (H) 08/12/2019    Review of Glycemic Control Results for NAHIEM, DREDGE (MRN [id number]) as of 08/19/2019 10:02  Ref. Range 08/18/2019 15:57 08/18/2019 16:24 08/18/2019 21:30 08/19/2019 07:18  Glucose-Capillary Latest Ref Range: 70 - 99 mg/dL 643 (H) 838 (H) 84 81   Diabetes history: DM2 Home: Lantus 70 units QHS, Novolog 15 units BID Current orders for Inpatient glycemic control:  Lantus 28 units daily Novolog 0-20 units TID + 0-5 units QHS Novolog 6 units meal coverage TID   Inpatient Diabetes Program Recommendations:    Noted hypoglycemia of 49 mg/dL on serum this AM. Consider further decreasing Lantus to 22 units QHS.  Thanks, Lujean Rave, MSN, RNC-OB Diabetes Coordinator [phone] (8a-5p)

## 2019-08-19 NOTE — Progress Notes (Signed)
Threatening to sign out AMA-TEE canceled as he ate breakfast this morning.  Claims he is "just tired of lying in the hospital doing nothing" Spoke to wife-she came up to the room earlier this morning-this MD, patient and wife had a long discussion-patient is adamant on leaving the hospital today.  He does not want to go to CIR.    I explained the life-threatening and life disabling risks of leaving AGAINST MEDICAL ADVICE but he is adamant.  I have spoken with Dr. Waldron Session are to treat empirically for 6 weeks, I have spoken with Dr. Macarthur Critchley will try and get a outpatient TEE.  Subsequently spoke with case management to see if we can arrange for maximal home health services so that we can safely discharge this patient home today.  Patient/family know this is not a ideal discharge situation but since patient is adamant on leaving the hospital at all cost-we will try to do the best we can.  See discharge summary for details.

## 2019-08-19 NOTE — TOC Progression Note (Addendum)
Transition of Care [facility]) - Progression Note    Patient Details  Name: Kevin Long MRN: [id number] Date of Birth: 1960/06/04  [facility]) CM/SW Contact  Daemion Mcniel, Manfred Arch, RN Phone Number: 08/19/2019, 1:05 PM  Clinical Narrative:     CM informed by attending that pt is now refusing CIR and plans to discharge home with IV antibiotics.  CM spoke with both pt and wife and discussed the following;  Potential barriers with securing HH secondary to [employer],  requirement of trainable caregiver for IV antibiotics. Wife informed CM that she can administer IV antibiotics at home for pt. Pt informed CM he is 100% service [facility] with the [facility] and would like to explore the VA benefit if CM can not secure HH via medicaid.  CM contacted [facility] and notified of admit and requested pts SW Otho Perl to return phone call related to discharge needs  CM provided medicare.gov HH/DME choice - no preference given  Denials for [facility] via Medicaid Inter[facility] - no available RN [facility] - unable to accept  [facility] - no available RN  [facility] - no available RN [facility] - Unable to accept [facility] -  Unable to accept [facility] -unable to accept [facility] - no available RN [facility] - unable to accept  [facility] can provide IV infusion via the medicaid benefit and can secure HHRN Only via [facility].  [facility] will accept pt via the VA benefit once auth is received for [facility].   CM was able to reach [facility] of the [place] and requested; HHRN, PT, OT, AIde and SW.  CM explained that pt is ready to leave hospital today  VA informed CM that [facility] auth will not be obtained before early next week and that pt can discharge today with [facility] via medicaid and then [facility] can be switched to coverage under VA once auth is received by [facility]. AHH in agreement with plan and will followup with VA liaison for auth next week.      Pt refusing to remain in hospital until [place] provides [facility] Auth - pt choosing to leave today with only HHRN secured under  medicaid.      CM verified home address of pt and phone number.  Wife will transport pt home at discharge. Wife informed CM that pt already has a walker and 3:! In the home -  Both pt and wife declined needing additonal DME.  TOC will deliver discharge meds to unit  Attending aware of discharge plan above-pt deemed stable for discharge home today with only [facility] secured.  CM faxed requested clinicals to Dr Gerlene Fee at [phone].    [facility] liaison will teach pt's wife on IV Infusion this afternoon prior to discharge.  Pt will need to have his 2pm dose given here at [facility], the 10pm dose will be administered at home by his wife. [facility] will have a SOC on tomorrow.    Update: Pt now in agreement with wheelchair as recommended by therapy.  [facility] chosen - referral accepted pending orders.  Eliquis is on the medicaid preferred drug list        Expected Discharge Plan and Services           Expected Discharge Date: 08/19/19               DME Arranged: Wheelchair manual DME Agency: [facility] Date DME Agency Contacted: 08/19/19 Time DME Agency Contacted: [phone] Representative spoke with at DME Agency: zack HH Arranged: RN HH Agency: Other - See  comment(HHRN arranged directly via [facility])     Representative spoke with at [place]: Orville Govern was secured via [facility] IV infusion liason [place] Determinants of Health (SDOH) Interventions    Readmission Risk Interventions No flowsheet data found.

## 2019-08-19 NOTE — TOC Benefit Eligibility Note (Signed)
Transition of Care [facility]) Benefit Eligibility Note    Patient Details  Name: Kevin Long MRN: [id number] Date of Birth: April 22, 1960   Medication/Dose: ? 10 MG BID  and  2.5 MG   5 MG BID        Prescription Coverage Preferred Pharmacy: [facility]              Additional Notes: PRIMARY INS: MEDICAID  [place] ACCESS    EFF-DATE: 07-11-2012   CO-PAY- $ 4.00 FOR EACH PRESCRIPTION    Mardene Sayer Phone Number: 08/19/2019, 1:35 PM

## 2019-08-19 NOTE — Discharge Summary (Addendum)
PATIENT DETAILS Name: Kevin Long Age: 59 y.o. Sex: male Date of Birth: Jul 03, 1960 MRN: [id number]. Admitting Physician: Vernelle Emerald, MD DTO:[email or web address], Christean Grief, MD  Admit Date: 08/11/2019 Discharge date: 08/19/2019  Recommendations for Outpatient Follow-up:  1. Follow up with PCP in 1-2 weeks 2. Please obtain CMP/CBC in one week 3. Repeat Chest Xray/CT Chest in 4-6 week 4. Please follow up with ID, and cardiology 5. Due to soft blood pressure-BiDil remains on hold-reassess at next visit with cardiology.  Admitted From:  Home  Disposition: Home with home health services   [place]: Yes  Equipment/Devices: PICC   Discharge Condition: Stable  CODE STATUS: FULL CODE  Diet recommendation:  Diet Order            Diet Carb Modified Fluid consistency: Thin; Room service appropriate? Yes; Fluid restriction: 1500 mL Fluid  Diet effective now        Diet - low sodium heart healthy        Diet Carb Modified               Brief Narrative: Patient is a 59 y.o. male with PMHx of chronic systolic heart failure, HTN, DM-2-, OSA noncompliant to CPAP who presented with shortness of breath-found to have acute hypoxic respiratory failure secondary to PE, COVID-19 pneumonia, lung mass, decompensated heart failure and possible bacterial pneumonia.  Post admission-blood cultures positive for MSSA.  See below for further details.  Significant Events: 4/29>> COVID-19 positive at [facility] (results available in care everywhere) 5/13>> admit to [facility] for hypoxia-CT chest with lung mass, postobstructive pneumonia, groundglass opacities and PE. 5/17>> transitioned to room air  Significant studies 5/13>> CT chest: 7.7x8.2 masslike consolidation in the right upper lobe with surrounding groundglass/tree-in-bud nodularity reflecting postobstructive pneumonia, additional areas of multifocal/groundglass pneumonia, segmental/subsegmental PE in the right upper lobe. 5/14>> TTE: KD<98%, RV  systolic function severely reduced 5/17>> lower extremity Doppler: Negative for DVT  COVID-19 medications: Steroids: 5/13>>5/18 Remdesivir: 5/13>>5/18  Antibiotics: Ancef: 5/14>> Unasyn: 5/14>>5/14 Zithromax: 5/13>>5/15 Ceftriaxone: 5/13 x 1  Microbiology data: 5/16: Blood culture>> no growth 5/13: Blood culture>> 1/2 MSSA  5/13: Urine culture>> no growth   Procedures: 5/20>>PICC  Brief Hospital Course: Acute Hypoxic Resp Failure due to Covid 19 Viral pneumonia, postobstructive pneumonia and pulmonary embolism: Significantly improved-hypoxia has resolved-he has been on room air for the past few days.  Treated with steroids/remdesivir/anticoagulation and antibiotics.  Has completed a course of steroids/remdesivir-remains on anticoagulation and Ancef.  COVID-19 Labs:  Recent Labs    08/17/19 0950  CRP 1.9*    No results found for: SARSCOV2NAA   MSSA bacteremia with possible septic emboli to the lung/lung abscess RUL: Remains on Ancef-repeat blood culture on 5/16 negative so far.  TTE on 5/14-no obvious vegetation.    Plans were for TEE today-however patient ate a breakfast meal-and subsequently TEE was canceled with plans to pursue it next week.  Unfortunately-patient has decided that he is going to leave the hospital [place] extensive discussion with him/spouse (see my prior note)-we will plan on formally discharging him home with home health services.  Dr. Einar Gip will try and arrange outpatient TEE at some point.  He will continue on IV Ancef-Dr. Baxter Flattery will try and arrange for outpatient follow-up.   Acute on chronic systolic heart failure (EF 25-30% by TTE on 11/19/2016):  Significantly better-volume status is stable-resume Lasix and diuretic.  Blood pressure seems on the softer side-continue to hold BiDil until seen by cardiology in the outpatient  setting.   Per Dr. Gardiner Fanti has been noncompliant to medications and follow-up-and unfortunately he is not  a candidate for device placement due to noncompliance.  Suspect not a candidate for Entresto/ACEI/ARB given noncompliance, soft blood pressure and lingering AKI.  Acute metabolic encephalopathy: Secondary to hypoxemia/bacteremia.  Encephalopathy has improved with treatment of the underlying etiologies.  AKI: Likely hemodynamically mediated in the setting of COVID-19-UA negative for proteinuria-was managed with supportive care-renal function has markedly improved by the day of discharge.  Pulmonary embolism: Initially on Lovenox-has been transitioned to Eliquis.   Lower extremity Doppler negative for DVT.  Lung mass: Images reviewed with PCCM-Dr. June Leap on 5/17-suspected consolidation likely secondary to MSSA bacteremia/lung consolidation due to infection rather than a malignancy.  Does not advise that be proceed with a bronchoscopy right away, recommended that we treat with antimicrobial therapy for several weeks with plans to repeat CT chest in a month or so-before considering bronchoscopy to rule out malignancy.  Patient is ex-smoker-quit 2009.   Elevated troponin: Probably demand ischemia in the setting of PE/hypoxemia-recheck troponin levels later this afternoon.  EKG nonacute.  Outpatient cardiology note-last LHC in 2014 revealed normal coronary arteries.  HTN: Controlled-continue diuretics and beta-blocker  Bilateral foot pain: Ongoing for the past 1-2 months-apparently per patient-he stopped ambulating due to pain.  Pain was mostly in the bilateral toes and forefoot area-no response to steroids-doubt this is gout even though uric acid levels on the higher side.  Plain x-rays without any acute abnormalities. Started Neurontin-with significant improvement-hence likely neuropathic pain in the setting of diabetes.    Insulin-dependent DM-2 with uncontrolled hyperglycemia due to poor outpatient control worsened by steroids (A1c 12.4 on 08/12/2019): Poor outpatient control as A1c  12.4-has had issues with hyperglycemia and hypoglycemia during this hospital stay-CBGs stable for the past 24 hours-continue Lantus 28 units and SSI on discharge.   Morbid Obesity: Estimated body mass index is 37.86 kg/m as calculated from the following:   Height as of this encounter: 5' 6" (1.676 m).   Weight as of this encounter: 106.4 kg.    Discharge Diagnoses:  Principal Problem:   Sepsis due to COVID-19 [facility]) Active Problems:   Essential hypertension   Chronic combined systolic and diastolic congestive heart failure (HCC)   Uncontrolled type 2 diabetes mellitus with hyperglycemia, with long-term current use of insulin (HCC)   Acute metabolic encephalopathy   Acute respiratory failure with hypoxemia (HCC)   Lactic acidosis   Acute pulmonary embolism without acute cor pulmonale (HCC)   AKI (acute kidney injury) ([place])   Mass of upper lobe of right lung   Discharge Instructions:    Person Under Monitoring Name: Kevin Long  Location: [address] [place] [address]   Infection Prevention Recommendations for Individuals Confirmed to have, or Being Evaluated for, 2019 Novel Coronavirus (COVID-19) Infection Who Receive Care at Home  Individuals who are confirmed to have, or are being evaluated for, COVID-19 should follow the prevention steps below until a healthcare provider or local or state health department says they can return to normal activities.  Stay home except to get medical care You should restrict activities outside your home, except for getting medical care. Do not go to work, school, or public areas, and do not use public transportation or taxis.  Call ahead before visiting your doctor Before your medical appointment, call the healthcare provider and tell them that you have, or are being evaluated for, COVID-19 infection. This will help the healthcare providers  office take steps to keep other people from getting infected. Ask your healthcare  provider to call the local or state health department.  Monitor your symptoms Seek prompt medical attention if your illness is worsening (e.g., difficulty breathing). Before going to your medical appointment, call the healthcare provider and tell them that you have, or are being evaluated for, COVID-19 infection. Ask your healthcare provider to call the local or state health department.  Wear a facemask You should wear a facemask that covers your nose and mouth when you are in the same room with other people and when you visit a healthcare provider. People who live with or visit you should also wear a facemask while they are in the same room with you.  Separate yourself from other people in your home As much as possible, you should stay in a different room from other people in your home. Also, you should use a separate bathroom, if available.  Avoid sharing household items You should not share dishes, drinking glasses, cups, eating utensils, towels, bedding, or other items with other people in your home. After using these items, you should wash them thoroughly with soap and water.  Cover your coughs and sneezes Cover your mouth and nose with a tissue when you cough or sneeze, or you can cough or sneeze into your sleeve. Throw used tissues in a lined trash can, and immediately wash your hands with soap and water for at least 20 seconds or use an alcohol-based hand rub.  Wash your [facility] your hands often and thoroughly with soap and water for at least 20 seconds. You can use an alcohol-based hand sanitizer if soap and water are not available and if your hands are not visibly dirty. Avoid touching your eyes, nose, and mouth with unwashed hands.   Prevention Steps for Caregivers and Household Members of Individuals Confirmed to have, or Being Evaluated for, COVID-19 Infection Being Cared for in the Home  If you live with, or provide care at home for, a person confirmed to have, or  being evaluated for, COVID-19 infection please follow these guidelines to prevent infection:  Follow healthcare providers instructions Make sure that you understand and can help the patient follow any healthcare provider instructions for all care.  Provide for the patients basic needs You should help the patient with basic needs in the home and provide support for getting groceries, prescriptions, and other personal needs.  Monitor the patients symptoms If they are getting sicker, call his or her medical provider and tell them that the patient has, or is being evaluated for, COVID-19 infection. This will help the healthcare providers office take steps to keep other people from getting infected. Ask the healthcare provider to call the local or state health department.  Limit the number of people who have contact with the patient  If possible, have only one caregiver for the patient.  Other household members should stay in another home or place of residence. If this is not possible, they should stay  in another room, or be separated from the patient as much as possible. Use a separate bathroom, if available.  Restrict visitors who do not have an essential need to be in the home.  Keep older adults, very young children, and other sick people away from the patient Keep older adults, very young children, and those who have compromised immune systems or chronic health conditions away from the patient. This includes people with chronic heart, lung, or kidney conditions, diabetes, and  cancer.  Ensure good ventilation Make sure that shared spaces in the home have good air flow, such as from an air conditioner or an opened window, weather permitting.  Wash your hands often  Wash your hands often and thoroughly with soap and water for at least 20 seconds. You can use an alcohol based hand sanitizer if soap and water are not available and if your hands are not visibly dirty.  Avoid  touching your eyes, nose, and mouth with unwashed hands.  Use disposable paper towels to dry your hands. If not available, use dedicated cloth towels and replace them when they become wet.  Wear a facemask and gloves  Wear a disposable facemask at all times in the room and gloves when you touch or have contact with the patients blood, body fluids, and/or secretions or excretions, such as sweat, saliva, sputum, nasal mucus, vomit, urine, or feces.  Ensure the mask fits over your nose and mouth tightly, and do not touch it during use.  Throw out disposable facemasks and gloves after using them. Do not reuse.  Wash your hands immediately after removing your facemask and gloves.  If your personal clothing becomes contaminated, carefully remove clothing and launder. Wash your hands after handling contaminated clothing.  Place all used disposable facemasks, gloves, and other waste in a lined container before disposing them with other household waste.  Remove gloves and wash your hands immediately after handling these items.  Do not share dishes, glasses, or other household items with the patient  Avoid sharing household items. You should not share dishes, drinking glasses, cups, eating utensils, towels, bedding, or other items with a patient who is confirmed to have, or being evaluated for, COVID-19 infection.  After the person uses these items, you should wash them thoroughly with soap and water.  Wash laundry thoroughly  Immediately remove and wash clothes or bedding that have blood, body fluids, and/or secretions or excretions, such as sweat, saliva, sputum, nasal mucus, vomit, urine, or feces, on them.  Wear gloves when handling laundry from the patient.  Read and follow directions on labels of laundry or clothing items and detergent. In general, wash and dry with the warmest temperatures recommended on the label.  Clean all areas the individual has used often  Clean all touchable  surfaces, such as counters, tabletops, doorknobs, bathroom fixtures, toilets, phones, keyboards, tablets, and bedside tables, every day. Also, clean any surfaces that may have blood, body fluids, and/or secretions or excretions on them.  Wear gloves when cleaning surfaces the patient has come in contact with.  Use a diluted bleach solution (e.g., dilute bleach with 1 part bleach and 10 parts water) or a household disinfectant with a label that says EPA-registered for coronaviruses. To make a bleach solution at home, add 1 tablespoon of bleach to 1 quart (4 cups) of water. For a larger supply, add  cup of bleach to 1 gallon (16 cups) of water.  Read labels of cleaning products and follow recommendations provided on product labels. Labels contain instructions for safe and effective use of the cleaning product including precautions you should take when applying the product, such as wearing gloves or eye protection and making sure you have good ventilation during use of the product.  Remove gloves and wash hands immediately after cleaning.  Monitor yourself for signs and symptoms of illness Caregivers and household members are considered close contacts, should monitor their health, and will be asked to limit movement outside of the home to  the extent possible. Follow the monitoring steps for close contacts listed on the symptom monitoring form.   ? If you have additional questions, contact your local health department or call the epidemiologist on call at [phone] (available 24/7). ? This guidance is subject to change. For the most up-to-date guidance from CDC, please refer to their website: YouBlogs.pl    Activity:  As tolerated with Full fall precautions use walker/cane & assistance as needed  Discharge Instructions    (HEART FAILURE PATIENTS) Call MD:  Anytime you have any of the following symptoms: 1) 3 pound weight gain in  24 hours or 5 pounds in 1 week 2) shortness of breath, with or without a dry hacking cough 3) swelling in the hands, feet or stomach 4) if you have to sleep on extra pillows at night in order to breathe.   Complete by: As directed    [facility] pharmacist to adjust dose for Vancomycin, Aminoglycosides and other anti-infective therapies as requested by physician.   Complete by: As directed    [facility] to provide Cath Flo 21m   Complete by: As directed    Administer for PICC line occlusion and as ordered by physician for other access device issues.   Anaphylaxis Kit: Provided to treat any anaphylactic reaction to the medication being provided to the patient if First Dose or when requested by physician   Complete by: As directed    Epinephrine 1248mml vial / amp: Administer 0.48m1m0.48ml55mubcutaneously once for moderate to severe anaphylaxis, nurse to call physician and pharmacy when reaction occurs and call 911 if needed for immediate care   Diphenhydramine 50mg34mIV vial: Administer 25-50mg 79mM PRN for first dose reaction, rash, itching, mild reaction, nurse to call physician and pharmacy when reaction occurs   Sodium Chloride 0.9% NS 500ml I21mdminister if needed for hypovolemic blood pressure drop or as ordered by physician after call to physician with anaphylactic reaction   Call MD for:  difficulty breathing, headache or visual disturbances   Complete by: As directed    Call MD for:  persistant nausea and vomiting   Complete by: As directed    Call MD for:  temperature >100.4   Complete by: As directed    Change dressing on IV access line weekly and PRN   Complete by: As directed    Diet - low sodium heart healthy   Complete by: As directed    Diet Carb Modified   Complete by: As directed    Discharge instructions   Complete by: As directed    Follow with Primary MD  AvbuereNolene Ebbs 1-2 weeks  You need a repeat CT scan of the chest in around 4 weeks  to make sure that the lung mass is improved-if the lung mass is still persistent-this very well could be cancer-and you will need a referral to the lung doctors for a bronchoscopy.    You will need follow-up with infectious disease clinic-they will call you with a follow-up appointment.  You will need follow-up with Dr. Ganji'sIrven Shelling for outpatient TEE  Please get a complete blood count and chemistry panel checked by your Primary MD at your next visit, and again as instructed by your Primary MD.  Get Medicines reviewed and adjusted: Please take all your medications with you for your next visit with your Primary MD  Laboratory/radiological data: Please request your Primary MD to go over all hospital tests and procedure/radiological results at the follow  up, please ask your Primary MD to get all Hospital records sent to his/her office.  In some cases, they will be blood work, cultures and biopsy results pending at the time of your discharge. Please request that your primary care M.D. follows up on these results.  Also Note the following: If you experience worsening of your admission symptoms, develop shortness of breath, life threatening emergency, suicidal or homicidal thoughts you must seek medical attention immediately by calling 911 or calling your MD immediately  if symptoms less severe.  You must read complete instructions/literature along with all the possible adverse reactions/side effects for all the Medicines you take and that have been prescribed to you. Take any new Medicines after you have completely understood and accpet all the possible adverse reactions/side effects.   Do not drive when taking Pain medications or sleeping medications (Benzodaizepines)  Do not take more than prescribed Pain, Sleep and Anxiety Medications. It is not advisable to combine anxiety,sleep and pain medications without talking with your primary care practitioner  Special Instructions: If you have  smoked or chewed Tobacco  in the last 2 yrs please stop smoking, stop any regular Alcohol  and or any Recreational drug use.  Wear Seat belts while driving.  Please note: You were cared for by a hospitalist during your hospital stay. Once you are discharged, your primary care physician will handle any further medical issues. Please note that NO REFILLS for any discharge medications will be authorized once you are discharged, as it is imperative that you return to your primary care physician (or establish a relationship with a primary care physician if you do not have one) for your post hospital discharge needs so that they can reassess your need for medications and monitor your lab values.   Flush IV access with Sodium Chloride 0.9% and Heparin 10 units/ml or 100 units/ml   Complete by: As directed    Home infusion instructions - [facility]   Complete by: As directed    Instructions: Flush IV access with Sodium Chloride 0.9% and Heparin 10units/ml or 100units/ml   Change dressing on IV access line: Weekly and PRN   Instructions Cath Flo 53m: Administer for PICC Line occlusion and as ordered by physician for other access device   [facility] pharmacist to adjust dose for: Vancomycin, Aminoglycosides and other anti-infective therapies as requested by physician   Increase activity slowly   Complete by: As directed    Method of administration may be changed at the discretion of home infusion pharmacist based upon assessment of the patient and/or caregivers ability to self-administer the medication ordered   Complete by: As directed      Allergies as of 08/19/2019      Reactions   Celebrex [celecoxib] Swelling   Swelling in face   Entresto [sacubitril-valsartan] Other (See Comments)   Dizziness      Medication List    STOP taking these medications   amLODipine 5 MG tablet Commonly known as: NORVASC   aspirin EC 81 MG tablet     TAKE these medications     acetaminophen 500 MG tablet Commonly known as: TYLENOL Take 1,000 mg by mouth every 6 (six) hours as needed for headache (pain).   albuterol 108 (90 Base) MCG/ACT inhaler Commonly known as: VENTOLIN HFA Inhale 2 puffs into the lungs 4 (four) times daily as needed for wheezing or shortness of breath.   albuterol (2.5 MG/3ML) 0.083% nebulizer solution Commonly known as: PROVENTIL Take 2.5  mg by nebulization every 6 (six) hours as needed for wheezing or shortness of breath.   Apixaban Starter Pack (11m and 582m Commonly known as: ELIQUIS STARTER PACK Take as directed on package: start with two-53m19mablets twice daily for 7 days. On day 8 (08/23/19), switch to one-53mg39mblet twice daily.   BiDil 20-37.5 MG tablet Generic drug: isosorbide-hydrALAZINE Take 1 tablet by mouth 3 (three) times daily.   carvedilol 6.25 MG tablet Commonly known as: COREG Take 1 tablet (6.25 mg total) by mouth 2 (two) times daily with a meal. What changed:   medication strength  how much to take   ceFAZolin  IVPB Commonly known as: ANCEF Inject 2 g into the vein every 8 (eight) hours. Indication:  MSSA bacteremia/ post-obstructive pneumonia First Dose: Yes Last Day of Therapy:  09/23/2019 Labs - Once weekly:  CBC/D and BMP, Labs - Every other week:  ESR and CRP Method of administration: IV Push Method of administration may be changed at the discretion of home infusion pharmacist based upon assessment of the patient and/or caregiver's ability to self-administer the medication ordered.   FLAX SEEDS PO Take 1 capsule by mouth daily.   furosemide 40 MG tablet Commonly known as: LASIX Take 40 mg by mouth daily as needed (leg swelling).   gabapentin 100 MG capsule Commonly known as: NEURONTIN Take 1 capsule (100 mg total) by mouth 3 (three) times daily.   Lantus SoloStar 100 UNIT/ML Solostar Pen Generic drug: insulin glargine Inject 28 Units into the skin at bedtime. What changed: how much to  take   NovoLOG FlexPen 100 UNIT/ML FlexPen Generic drug: insulin aspart 0-20 Units, Subcutaneous, 3 times daily with meals CBG < 70: Implement Hypoglycemia measures CBG 70 - 120: 0 units CBG 121 - 150: 3 units CBG 151 - 200: 4 units CBG 201 - 250: 7 units CBG 251 - 300: 11 units CBG 301 - 350: 15 units CBG 351 - 400: 20 units CBG > 400: call MD What changed:   how much to take  how to take this  when to take this  additional instructions   vitamin C 1000 MG tablet Take 1,000 mg by mouth daily.   Vitamin D 50 MCG (2000 UT) tablet Take 2,000 Units by mouth daily.   Zinc 50 MG Tabs Take 50 mg by mouth daily.            Discharge Care Instructions  (From admission, onward)         Start     Ordered   08/19/19 0000  Change dressing on IV access line weekly and PRN  (Home infusion instructions - [facility] )     08/19/19 1122         Follow-up Information    AvbuNolene Ebbs. Schedule an appointment as soon as possible for a visit in 1 week(s).   Specialty: Internal Medicine Contact information: [phone]        SnidCarlyle Basques Follow up.   Specialty: Infectious Diseases Why: office will call for a follow up appointment Contact information: [phone]        GanjAdrian Prows Follow up.   Specialty: Cardiology Why: office will call for a follow up appointment Contact information: [phone]      Allergies  Allergen Reactions   Celebrex [Celecoxib] Swelling  Swelling in face   Entresto [Sacubitril-Valsartan] Other (See Comments)    Dizziness    Consultations:   cardiology and ID  Other Procedures/Studies: CT Angio Chest PE W/Cm &/Or Wo Cm  Result Date: 08/11/2019 CLINICAL DATA:  Shortness of breath, hypoxia, hemoptysis, recent COVID-19 diagnosis 6 days prior EXAM: CT  ANGIOGRAPHY CHEST WITH CONTRAST TECHNIQUE: Multidetector CT imaging of the chest was performed using the standard protocol during bolus administration of intravenous contrast. Multiplanar CT image reconstructions and MIPs were obtained to evaluate the vascular anatomy. CONTRAST:  140m OMNIPAQUE IOHEXOL 350 MG/ML SOLN COMPARISON:  Same-day radiograph FINDINGS: Cardiovascular: Satisfactory opacification the pulmonary arteries to the segmental level. Evaluation beyond the lobar level is limited due to extensive respiratory motion artifact. However, there is suspected filling defect within the segmental and subsegmental pulmonary arteries of the posterior right lower lobe extending to the ostium of the right upper lobar pulmonary artery. Finding is best seen on coronal MIP imaging series 14, image [id number]. Several of the pulmonary arteries in the region of the superior segment right lower lobe and right middle lobe appear narrowed as the past within the perihilar density detailed below. Central pulmonary arteries are normal caliber. Slight reflux of contrast into the IVC and hepatic veins. Without other convincing CT evidence of right heart strain. Borderline cardiomegaly without pericardial effusion. Atherosclerotic plaque within the normal caliber aorta. Shared origin of the left common carotid and brachiocephalic artery. Minimal plaque in the proximal great vessels. Mediastinum/Nodes: Multiple subcentimeter mediastinal and hilar nodes are present. More confluent soft tissue opacity extending within the subcarinal space in into the right hilum (9/44-48). Mild focal narrowing of the right mainstem bronchus and bronchus intermedius in this region of soft tissue attenuation. Possibly reflecting conglomerate nodal tissue versus direct extension of a mass. Tiny diverticulum seen arising from the inferomedial aspect of the left mainstem bronchus. No other acute abnormality of the central airways. Difficult to discern the  margins of the distal thoracic esophagus from the soft tissue in the subcarinal region. Thyroid gland and thoracic inlet are unremarkable. Lungs/Pleura: Dense masslike consolidation measuring approximately 7.7 x 8.2 cm (11/35) is seen in the posterior segment right upper lobe abutting the major fissure with confluent soft tissue attenuation extending into the right hilum and subcarinal region which is highly worrisome for an underlying mass lesion particularly given narrowing of the airways and pulmonary arteries in this location. However, there are additional peripheral tree-in-bud opacities and multifocal mixed ground-glass and consolidation elsewhere throughout the lungs. No visible pneumothorax or effusion. Background of centrilobular emphysema is noted towards the apices. Upper Abdomen: No acute abnormalities present in the visualized portions of the upper abdomen. Musculoskeletal: No acute osseous abnormality or suspicious osseous lesion. Multilevel degenerative changes are present in the imaged portions of the spine. Additional degenerative changes at the left sternoclavicular joint and bilateral shoulders. Review of the MIP images confirms the above findings. IMPRESSION: 1. Segmental and subsegmental pulmonary artery emboli in the pulmonary arteries of the posterior segment right upper lobe extending towards the ostial origin from the left upper lobar pulmonary artery. No CT evidence of right heart strain. 2. Dense masslike consolidation measuring approximately 7.7 x 8.2 cm in the posterior segment right upper lobe abutting the major fissure with confluent soft tissue attenuation extending into the right hilum and subcarinal region which is highly worrisome for an underlying mass lesion particularly given narrowing of the airways and pulmonary arteries in this location. While dense infectious consolidation or large pulmonary infarct could  have a similar appearance high suspicion should be maintained with  continued follow-up imaging following acute intervention. Surrounding areas of ground-glass and tree-in-bud nodularity could reflect some postobstructive pneumonia given truncation the airways entering this region. 3. Additional multifocal areas of mixed consolidation and ground-glass opacity compatible with atypical pneumonia in the setting of known COVID-19 positivity. 4. Additional subcentimeter mediastinal and hilar nodes, likely reactive though metastatic disease is not excluded. 5. Aortic Atherosclerosis (ICD10-I70.0). These results were called by telephone at the time of interpretation on 08/11/2019 at 10:48 pm to provider GARRETT GREEN , who verbally acknowledged these results. Electronically Signed   By: Lovena Le M.D.   On: 08/11/2019 22:49   DG Chest Port 1 View  Result Date: 08/11/2019 CLINICAL DATA:  Fall shortness of breath, hypoxia, COVID-19 positive EXAM: PORTABLE CHEST 1 VIEW COMPARISON:  Radiograph 10/24/2013 FINDINGS: Focal opacity is seen in the right mid lung, likely in the upper lobe. More mixed patchy and interstitial opacities are seen in the lung bases and periphery. No pneumothorax or visible effusion. Mild cardiomegaly though possibly accentuated by portable technique. Remaining cardiomediastinal contours are unremarkable. No acute osseous or soft tissue abnormality. Telemetry leads overlie the chest. IMPRESSION: 1. Focal opacity in the right mid lung, likely in the upper lobe, suspicious for pneumonia. 2. Additional patchy and interstitial opacities in the lung bases and periphery may reflect additional areas of infection in the setting of COVID-19. Electronically Signed   By: Lovena Le M.D.   On: 08/11/2019 15:38   DG Foot Complete Left  Result Date: 08/14/2019 CLINICAL DATA:  Left foot pain, bacteremia EXAM: LEFT FOOT - COMPLETE 3+ VIEW COMPARISON:  None. FINDINGS: No acute fracture. No dislocation. Mild first MTP joint space narrowing. Well defined marginal lucencies with  sclerotic margins at the medial aspect of the left first metatarsal head, which can be seen in the setting of a crystalline arthropathy such as gout. Small plantar calcaneal spur. There is soft tissue swelling over the dorsum of the forefoot. IMPRESSION: 1. Soft tissue swelling over the dorsum of the forefoot without underlying fracture or dislocation. 2. Small well defined marginal lucencies at the medial aspect of the left first metatarsal head, which may reflect sequela of gout. Electronically Signed   By: Davina Poke D.O.   On: 08/14/2019 17:10   DG Foot Complete Right  Result Date: 08/14/2019 CLINICAL DATA:  Bilateral foot pain for 2 months. Bacteremia EXAM: RIGHT FOOT COMPLETE - 3+ VIEW COMPARISON:  04/20/2008 FINDINGS: Postsurgical changes related to ORIF of the first through fourth tarsometatarsal joints. Hardware appears intact without perihardware lucency or fracture. 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 No acute osseous abnormality. No erosion or cortical destruction. Soft tissues are within normal limits. IMPRESSION: 1. No acute osseous abnormality. 2. Postsurgical changes related to ORIF of the first through fourth tarsometatarsal joints. No evidence for hardware complication. Electronically Signed   By: Davina Poke D.O.   On: 08/14/2019 17:08   ECHOCARDIOGRAM COMPLETE  Result Date: 08/12/2019    ECHOCARDIOGRAM REPORT   Patient Name:   Kevin Long Date of Exam: 08/12/2019 Medical Rec #:  [id number]     Height:       66.0 in Accession #:    [id number]    Weight:       232.8 lb Date of Birth:  1960-11-02     BSA:          2.133 m Patient Age:    38 years  BP:           90/75 mmHg Patient Gender: M             HR:           98 bpm. Exam Location:  Inpatient Procedure: 2D Echo Indications:    415.19 pulmonary embolus  History:        Patient has prior history of Echocardiogram examinations, most                 recent 06/26/2019. Cardiomyopathy; Risk Factors:Hypertension,                  Diabetes, Dyslipidemia and Former Smoker.  Sonographer:    Jannett Celestine RDCS (AE) Referring Phys: [id number] Sherryll [facility]  Sonographer Comments: Suboptimal apical window. Image acquisition challenging due to patient body habitus and Image acquisition challenging due to respiratory motion. very limited mobility IMPRESSIONS  1. Left ventricular ejection fraction, by estimation, is <20%. The left ventricle has severely decreased function. The left ventricle demonstrates global hypokinesis. The left ventricular internal cavity size was moderately dilated. Left ventricular diastolic function could not be evaluated.  2. Right ventricular systolic function is severely reduced. The right ventricular size is moderately enlarged. Tricuspid regurgitation signal is inadequate for assessing PA pressure.  3. Left atrial size was mild to moderately dilated.  4. Right atrial size was mildly dilated.  5. The mitral valve is degenerative. Trivial mitral valve regurgitation. No evidence of mitral stenosis.  6. The aortic valve is tricuspid. Aortic valve regurgitation is not visualized. Mild aortic valve sclerosis is present, with no evidence of aortic valve stenosis.  7. The inferior vena cava is dilated in size with <50% respiratory variability, suggesting right atrial pressure of 15 mmHg. Conclusion(s)/Recommendation(s): Probable moderate sized thrombus in LV apex. Recommend definity contrast to further evaulated extent. FINDINGS  Left Ventricle: Left ventricular ejection fraction, by estimation, is <20%. The left ventricle has severely decreased function. The left ventricle demonstrates global hypokinesis. The left ventricular internal cavity size was moderately dilated. There is no left ventricular hypertrophy. Left ventricular diastolic function could not be evaluated. Right Ventricle: The right ventricular size is moderately enlarged. No increase in right ventricular wall thickness. Right ventricular systolic  function is severely reduced. Tricuspid regurgitation signal is inadequate for assessing PA pressure. Left Atrium: Left atrial size was mild to moderately dilated. Right Atrium: Right atrial size was mildly dilated. Pericardium: There is no evidence of pericardial effusion. Mitral Valve: The mitral valve is degenerative in appearance. There is mild thickening of the mitral valve leaflet(s). Normal mobility of the mitral valve leaflets. Mild to moderate mitral annular calcification. Trivial mitral valve regurgitation. No evidence of mitral valve stenosis. Tricuspid Valve: The tricuspid valve is normal in structure. Tricuspid valve regurgitation is trivial. No evidence of tricuspid stenosis. Aortic Valve: The aortic valve is tricuspid. Aortic valve regurgitation is not visualized. Mild aortic valve sclerosis is present, with no evidence of aortic valve stenosis. Pulmonic Valve: The pulmonic valve was not well visualized. Pulmonic valve regurgitation is trivial. No evidence of pulmonic stenosis. Aorta: The aortic root is normal in size and structure. Venous: The inferior vena cava is dilated in size with less than 50% respiratory variability, suggesting right atrial pressure of 15 mmHg. IAS/Shunts: No atrial level shunt detected by color flow Doppler.  LEFT VENTRICLE PLAX 2D LVIDd:         6.10 cm LVIDs:         5.60 cm LV PW:  1.20 cm LV IVS:        0.90 cm LVOT diam:     2.30 cm LV SV:         38 LV SV Index:   18 LVOT Area:     4.15 cm  RIGHT VENTRICLE TAPSE (M-mode): 1.1 cm LEFT ATRIUM           Index       RIGHT ATRIUM           Index LA diam:      3.50 cm 1.64 cm/m  RA Area:     19.40 cm LA Vol (A2C): 92.6 ml 43.41 ml/m RA Volume:   58.70 ml  27.52 ml/m LA Vol (A4C): 21.9 ml 10.27 ml/m  AORTIC VALVE LVOT Vmax:   62.70 cm/s LVOT Vmean:  46.100 cm/s LVOT VTI:    0.090 m  AORTA Ao Root diam: 3.60 cm  SHUNTS Systemic VTI:  0.09 m Systemic Diam: 2.30 cm Fransico Him MD Electronically signed by Fransico Him MD Signature Date/Time: 08/12/2019/2:29:09 PM    Final    VAS [place] LOWER EXTREMITY VENOUS (DVT)  Result Date: 08/15/2019  Lower Venous DVTStudy Indications: COVID-19 positive, Pain, and Edema.  Comparison Study: No prior study Performing Technologist: Maudry Mayhew MHA, RDMS, RVT, RDCS  Examination Guidelines: A complete evaluation includes B-mode imaging, spectral Doppler, color Doppler, and power Doppler as needed of all accessible portions of each vessel. Bilateral testing is considered an integral part of a complete examination. Limited examinations for reoccurring indications may be performed as noted. The reflux portion of the exam is performed with the patient in reverse Trendelenburg.  +---------+---------------+---------+-----------+----------+--------------+  RIGHT     Compressibility Phasicity Spontaneity Properties Thrombus Aging  +---------+---------------+---------+-----------+----------+--------------+  CFV       Full            Yes       Yes                                    +---------+---------------+---------+-----------+----------+--------------+  SFJ       Full                                                             +---------+---------------+---------+-----------+----------+--------------+  FV Prox   Full                                                             +---------+---------------+---------+-----------+----------+--------------+  FV Mid    Full                                                             +---------+---------------+---------+-----------+----------+--------------+  FV Distal Full                                                             +---------+---------------+---------+-----------+----------+--------------+  PFV       Full                                                             +---------+---------------+---------+-----------+----------+--------------+  POP       Full            Yes       Yes                                     +---------+---------------+---------+-----------+----------+--------------+  PTV       Full                                                             +---------+---------------+---------+-----------+----------+--------------+  PERO      Full                                                             +---------+---------------+---------+-----------+----------+--------------+   +---------+---------------+---------+-----------+----------+--------------+  LEFT      Compressibility Phasicity Spontaneity Properties Thrombus Aging  +---------+---------------+---------+-----------+----------+--------------+  CFV       Full            Yes       Yes                                    +---------+---------------+---------+-----------+----------+--------------+  SFJ       Full                                                             +---------+---------------+---------+-----------+----------+--------------+  FV Prox   Full                                                             +---------+---------------+---------+-----------+----------+--------------+  FV Mid    Full                                                             +---------+---------------+---------+-----------+----------+--------------+  FV Distal Full                                                             +---------+---------------+---------+-----------+----------+--------------+  PFV       Full                                                             +---------+---------------+---------+-----------+----------+--------------+  POP       Full            Yes       Yes                                    +---------+---------------+---------+-----------+----------+--------------+  PTV       Full                                                             +---------+---------------+---------+-----------+----------+--------------+  PERO      Full                                                              +---------+---------------+---------+-----------+----------+--------------+     Summary: RIGHT: - There is no evidence of deep vein thrombosis in the lower extremity.  - No cystic structure found in the popliteal fossa.  LEFT: - There is no evidence of deep vein thrombosis in the lower extremity.  - No cystic structure found in the popliteal fossa.  *See table(s) above for measurements and observations. Electronically signed by Monica Martinez MD on 08/15/2019 at 5:46:23 PM.    Final    [place] EKG SITE RITE  Result Date: 08/18/2019 If Site Rite image not attached, placement could not be confirmed due to current cardiac rhythm.  [place] EKG SITE RITE  Result Date: 08/13/2019 If Site Rite image not attached, placement could not be confirmed due to current cardiac rhythm.  US Abdomen Limited RUQ  Result Date: 08/12/2019 CLINICAL DATA:  Abnormal LFTs EXAM: ULTRASOUND ABDOMEN LIMITED RIGHT UPPER QUADRANT COMPARISON:  None. FINDINGS: Gallbladder: The gallbladder appears to be filled with slightly a hyperechoic sludge. No sonographic Murphy sign. The gallbladder wall thickness measures 3 mm. No pericholecystic fluid. Common bile duct: Diameter: 5 mm Liver: No focal lesion identified. Within normal limits in parenchymal echogenicity. Portal vein is patent on color Doppler imaging with normal direction of blood flow towards the liver. Other: None. IMPRESSION: Gallbladder sludge.  No definite evidence of cholecystitis. Electronically Signed   By: Prudencio Pair M.D.   On: 08/12/2019 01:58     TODAY-DAY OF DISCHARGE:  Subjective:   Kevin Long today has no headache,no chest abdominal pain,no new weakness tingling or numbness, feels much better wants to go home today.  He has threatened to leave AGAINST MEDICAL ADVICE  Objective:   Blood pressure (!) 115/100, pulse 92, temperature 97.9 F (36.6 C), temperature source Oral, resp. rate 16, height 5' 6" (1.676 m), weight 106.4 kg, SpO2 93 %.  Intake/Output  Summary (Last 24 hours) at 08/19/2019 1125 Last data filed at  08/19/2019 0828 Gross per 24 hour  Intake 522.94 ml  Output 450 ml  Net 72.94 ml   Filed Weights   08/16/19 0400 08/17/19 0414 08/18/19 0359  Weight: 105.7 kg 108.9 kg 106.4 kg    Exam: Awake Alert, Oriented *3, No new F.N deficits, Normal affect [place].AT,PERRAL Supple Neck,No JVD, No cervical lymphadenopathy appriciated.  Symmetrical Chest wall movement, Good air movement bilaterally, CTAB RRR,No Gallops,Rubs or new Murmurs, No Parasternal Heave +ve B.Sounds, Abd Soft, Non tender, No organomegaly appriciated, No rebound -guarding or rigidity. No Cyanosis, Clubbing or edema, No new Rash or bruise   PERTINENT RADIOLOGIC STUDIES: CT Angio Chest PE W/Cm &/Or Wo Cm  Result Date: 08/11/2019 CLINICAL DATA:  Shortness of breath, hypoxia, hemoptysis, recent COVID-19 diagnosis 6 days prior EXAM: CT ANGIOGRAPHY CHEST WITH CONTRAST TECHNIQUE: Multidetector CT imaging of the chest was performed using the standard protocol during bolus administration of intravenous contrast. Multiplanar CT image reconstructions and MIPs were obtained to evaluate the vascular anatomy. CONTRAST:  12m OMNIPAQUE IOHEXOL 350 MG/ML SOLN COMPARISON:  Same-day radiograph FINDINGS: Cardiovascular: Satisfactory opacification the pulmonary arteries to the segmental level. Evaluation beyond the lobar level is limited due to extensive respiratory motion artifact. However, there is suspected filling defect within the segmental and subsegmental pulmonary arteries of the posterior right lower lobe extending to the ostium of the right upper lobar pulmonary artery. Finding is best seen on coronal MIP imaging series 14, image [id number]. Several of the pulmonary arteries in the region of the superior segment right lower lobe and right middle lobe appear narrowed as the past within the perihilar density detailed below. Central pulmonary arteries are normal caliber. Slight reflux of  contrast into the IVC and hepatic veins. Without other convincing CT evidence of right heart strain. Borderline cardiomegaly without pericardial effusion. Atherosclerotic plaque within the normal caliber aorta. Shared origin of the left common carotid and brachiocephalic artery. Minimal plaque in the proximal great vessels. Mediastinum/Nodes: Multiple subcentimeter mediastinal and hilar nodes are present. More confluent soft tissue opacity extending within the subcarinal space in into the right hilum (9/44-48). Mild focal narrowing of the right mainstem bronchus and bronchus intermedius in this region of soft tissue attenuation. Possibly reflecting conglomerate nodal tissue versus direct extension of a mass. Tiny diverticulum seen arising from the inferomedial aspect of the left mainstem bronchus. No other acute abnormality of the central airways. Difficult to discern the margins of the distal thoracic esophagus from the soft tissue in the subcarinal region. Thyroid gland and thoracic inlet are unremarkable. Lungs/Pleura: Dense masslike consolidation measuring approximately 7.7 x 8.2 cm (11/35) is seen in the posterior segment right upper lobe abutting the major fissure with confluent soft tissue attenuation extending into the right hilum and subcarinal region which is highly worrisome for an underlying mass lesion particularly given narrowing of the airways and pulmonary arteries in this location. However, there are additional peripheral tree-in-bud opacities and multifocal mixed ground-glass and consolidation elsewhere throughout the lungs. No visible pneumothorax or effusion. Background of centrilobular emphysema is noted towards the apices. Upper Abdomen: No acute abnormalities present in the visualized portions of the upper abdomen. Musculoskeletal: No acute osseous abnormality or suspicious osseous lesion. Multilevel degenerative changes are present in the imaged portions of the spine. Additional degenerative  changes at the left sternoclavicular joint and bilateral shoulders. Review of the MIP images confirms the above findings. IMPRESSION: 1. Segmental and subsegmental pulmonary artery emboli in the pulmonary arteries of the posterior segment right upper lobe extending towards  the ostial origin from the left upper lobar pulmonary artery. No CT evidence of right heart strain. 2. Dense masslike consolidation measuring approximately 7.7 x 8.2 cm in the posterior segment right upper lobe abutting the major fissure with confluent soft tissue attenuation extending into the right hilum and subcarinal region which is highly worrisome for an underlying mass lesion particularly given narrowing of the airways and pulmonary arteries in this location. While dense infectious consolidation or large pulmonary infarct could have a similar appearance high suspicion should be maintained with continued follow-up imaging following acute intervention. Surrounding areas of ground-glass and tree-in-bud nodularity could reflect some postobstructive pneumonia given truncation the airways entering this region. 3. Additional multifocal areas of mixed consolidation and ground-glass opacity compatible with atypical pneumonia in the setting of known COVID-19 positivity. 4. Additional subcentimeter mediastinal and hilar nodes, likely reactive though metastatic disease is not excluded. 5. Aortic Atherosclerosis (ICD10-I70.0). These results were called by telephone at the time of interpretation on 08/11/2019 at 10:48 pm to provider GARRETT GREEN , who verbally acknowledged these results. Electronically Signed   By: Lovena Le M.D.   On: 08/11/2019 22:49   DG Chest Port 1 View  Result Date: 08/11/2019 CLINICAL DATA:  Fall shortness of breath, hypoxia, COVID-19 positive EXAM: PORTABLE CHEST 1 VIEW COMPARISON:  Radiograph 10/24/2013 FINDINGS: Focal opacity is seen in the right mid lung, likely in the upper lobe. More mixed patchy and interstitial  opacities are seen in the lung bases and periphery. No pneumothorax or visible effusion. Mild cardiomegaly though possibly accentuated by portable technique. Remaining cardiomediastinal contours are unremarkable. No acute osseous or soft tissue abnormality. Telemetry leads overlie the chest. IMPRESSION: 1. Focal opacity in the right mid lung, likely in the upper lobe, suspicious for pneumonia. 2. Additional patchy and interstitial opacities in the lung bases and periphery may reflect additional areas of infection in the setting of COVID-19. Electronically Signed   By: Lovena Le M.D.   On: 08/11/2019 15:38   DG Foot Complete Left  Result Date: 08/14/2019 CLINICAL DATA:  Left foot pain, bacteremia EXAM: LEFT FOOT - COMPLETE 3+ VIEW COMPARISON:  None. FINDINGS: No acute fracture. No dislocation. Mild first MTP joint space narrowing. Well defined marginal lucencies with sclerotic margins at the medial aspect of the left first metatarsal head, which can be seen in the setting of a crystalline arthropathy such as gout. Small plantar calcaneal spur. There is soft tissue swelling over the dorsum of the forefoot. IMPRESSION: 1. Soft tissue swelling over the dorsum of the forefoot without underlying fracture or dislocation. 2. Small well defined marginal lucencies at the medial aspect of the left first metatarsal head, which may reflect sequela of gout. Electronically Signed   By: Davina Poke D.O.   On: 08/14/2019 17:10   DG Foot Complete Right  Result Date: 08/14/2019 CLINICAL DATA:  Bilateral foot pain for 2 months. Bacteremia EXAM: RIGHT FOOT COMPLETE - 3+ VIEW COMPARISON:  04/20/2008 FINDINGS: Postsurgical changes related to ORIF of the first through fourth tarsometatarsal joints. Hardware appears intact without perihardware lucency or fracture. 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 No acute osseous abnormality. No erosion or cortical destruction. Soft tissues are within normal limits.  IMPRESSION: 1. No acute osseous abnormality. 2. Postsurgical changes related to ORIF of the first through fourth tarsometatarsal joints. No evidence for hardware complication. Electronically Signed   By: Davina Poke D.O.   On: 08/14/2019 17:08   ECHOCARDIOGRAM COMPLETE  Result Date: 08/12/2019  ECHOCARDIOGRAM REPORT   Patient Name:   Kevin Long Date of Exam: 08/12/2019 Medical Rec #:  [id number]     Height:       66.0 in Accession #:    [id number]    Weight:       232.8 lb Date of Birth:  [birth year]     BSA:          2.133 m Patient Age:    28 years      BP:           90/75 mmHg Patient Gender: M             HR:           98 bpm. Exam Location:  Inpatient Procedure: 2D Echo Indications:    415.19 pulmonary embolus  History:        Patient has prior history of Echocardiogram examinations, most                 recent 06/26/2019. Cardiomyopathy; Risk Factors:Hypertension,                 Diabetes, Dyslipidemia and Former Smoker.  Sonographer:    Jannett Celestine RDCS (AE) Referring Phys: [id number] Sherryll Burger [facility]  Sonographer Comments: Suboptimal apical window. Image acquisition challenging due to patient body habitus and Image acquisition challenging due to respiratory motion. very limited mobility IMPRESSIONS  1. Left ventricular ejection fraction, by estimation, is <20%. The left ventricle has severely decreased function. The left ventricle demonstrates global hypokinesis. The left ventricular internal cavity size was moderately dilated. Left ventricular diastolic function could not be evaluated.  2. Right ventricular systolic function is severely reduced. The right ventricular size is moderately enlarged. Tricuspid regurgitation signal is inadequate for assessing PA pressure.  3. Left atrial size was mild to moderately dilated.  4. Right atrial size was mildly dilated.  5. The mitral valve is degenerative. Trivial mitral valve regurgitation. No evidence of mitral stenosis.  6. The aortic valve is tricuspid.  Aortic valve regurgitation is not visualized. Mild aortic valve sclerosis is present, with no evidence of aortic valve stenosis.  7. The inferior vena cava is dilated in size with <50% respiratory variability, suggesting right atrial pressure of 15 mmHg. Conclusion(s)/Recommendation(s): Probable moderate sized thrombus in LV apex. Recommend definity contrast to further evaulated extent. FINDINGS  Left Ventricle: Left ventricular ejection fraction, by estimation, is <20%. The left ventricle has severely decreased function. The left ventricle demonstrates global hypokinesis. The left ventricular internal cavity size was moderately dilated. There is no left ventricular hypertrophy. Left ventricular diastolic function could not be evaluated. Right Ventricle: The right ventricular size is moderately enlarged. No increase in right ventricular wall thickness. Right ventricular systolic function is severely reduced. Tricuspid regurgitation signal is inadequate for assessing PA pressure. Left Atrium: Left atrial size was mild to moderately dilated. Right Atrium: Right atrial size was mildly dilated. Pericardium: There is no evidence of pericardial effusion. Mitral Valve: The mitral valve is degenerative in appearance. There is mild thickening of the mitral valve leaflet(s). Normal mobility of the mitral valve leaflets. Mild to moderate mitral annular calcification. Trivial mitral valve regurgitation. No evidence of mitral valve stenosis. Tricuspid Valve: The tricuspid valve is normal in structure. Tricuspid valve regurgitation is trivial. No evidence of tricuspid stenosis. Aortic Valve: The aortic valve is tricuspid. Aortic valve regurgitation is not visualized. Mild aortic valve sclerosis is present, with no evidence of aortic valve stenosis. Pulmonic Valve: The pulmonic valve was not well  visualized. Pulmonic valve regurgitation is trivial. No evidence of pulmonic stenosis. Aorta: The aortic root is normal in size and  structure. Venous: The inferior vena cava is dilated in size with less than 50% respiratory variability, suggesting right atrial pressure of 15 mmHg. IAS/Shunts: No atrial level shunt detected by color flow Doppler.  LEFT VENTRICLE PLAX 2D LVIDd:         6.10 cm LVIDs:         5.60 cm LV PW:         1.20 cm LV IVS:        0.90 cm LVOT diam:     2.30 cm LV SV:         38 LV SV Index:   18 LVOT Area:     4.15 cm  RIGHT VENTRICLE TAPSE (M-mode): 1.1 cm LEFT ATRIUM           Index       RIGHT ATRIUM           Index LA diam:      3.50 cm 1.64 cm/m  RA Area:     19.40 cm LA Vol (A2C): 92.6 ml 43.41 ml/m RA Volume:   58.70 ml  27.52 ml/m LA Vol (A4C): 21.9 ml 10.27 ml/m  AORTIC VALVE LVOT Vmax:   62.70 cm/s LVOT Vmean:  46.100 cm/s LVOT VTI:    0.090 m  AORTA Ao Root diam: 3.60 cm  SHUNTS Systemic VTI:  0.09 m Systemic Diam: 2.30 cm Fransico Him MD Electronically signed by Fransico Him MD Signature Date/Time: 08/12/2019/2:29:09 PM    Final    VAS [place] LOWER EXTREMITY VENOUS (DVT)  Result Date: 08/15/2019  Lower Venous DVTStudy Indications: COVID-19 positive, Pain, and Edema.  Comparison Study: No prior study Performing Technologist: Maudry Mayhew MHA, RDMS, RVT, RDCS  Examination Guidelines: A complete evaluation includes B-mode imaging, spectral Doppler, color Doppler, and power Doppler as needed of all accessible portions of each vessel. Bilateral testing is considered an integral part of a complete examination. Limited examinations for reoccurring indications may be performed as noted. The reflux portion of the exam is performed with the patient in reverse Trendelenburg.  +---------+---------------+---------+-----------+----------+--------------+  RIGHT     Compressibility Phasicity Spontaneity Properties Thrombus Aging  +---------+---------------+---------+-----------+----------+--------------+  CFV       Full            Yes       Yes                                     +---------+---------------+---------+-----------+----------+--------------+  SFJ       Full                                                             +---------+---------------+---------+-----------+----------+--------------+  FV Prox   Full                                                             +---------+---------------+---------+-----------+----------+--------------+  FV Mid    Full                                                             +---------+---------------+---------+-----------+----------+--------------+  FV Distal Full                                                             +---------+---------------+---------+-----------+----------+--------------+  PFV       Full                                                             +---------+---------------+---------+-----------+----------+--------------+  POP       Full            Yes       Yes                                    +---------+---------------+---------+-----------+----------+--------------+  PTV       Full                                                             +---------+---------------+---------+-----------+----------+--------------+  PERO      Full                                                             +---------+---------------+---------+-----------+----------+--------------+   +---------+---------------+---------+-----------+----------+--------------+  LEFT      Compressibility Phasicity Spontaneity Properties Thrombus Aging  +---------+---------------+---------+-----------+----------+--------------+  CFV       Full            Yes       Yes                                    +---------+---------------+---------+-----------+----------+--------------+  SFJ       Full                                                             +---------+---------------+---------+-----------+----------+--------------+  FV Prox   Full                                                              +---------+---------------+---------+-----------+----------+--------------+  FV Mid    Full                                                             +---------+---------------+---------+-----------+----------+--------------+  FV Distal Full                                                             +---------+---------------+---------+-----------+----------+--------------+  PFV       Full                                                             +---------+---------------+---------+-----------+----------+--------------+  POP       Full            Yes       Yes                                    +---------+---------------+---------+-----------+----------+--------------+  PTV       Full                                                             +---------+---------------+---------+-----------+----------+--------------+  PERO      Full                                                             +---------+---------------+---------+-----------+----------+--------------+     Summary: RIGHT: - There is no evidence of deep vein thrombosis in the lower extremity.  - No cystic structure found in the popliteal fossa.  LEFT: - There is no evidence of deep vein thrombosis in the lower extremity.  - No cystic structure found in the popliteal fossa.  *See table(s) above for measurements and observations. Electronically signed by Monica Martinez MD on 08/15/2019 at 5:46:23 PM.    Final    [place] EKG SITE RITE  Result Date: 08/18/2019 If Site Rite image not attached, placement could not be confirmed due to current cardiac rhythm.  [place] EKG SITE RITE  Result Date: 08/13/2019 If Site Rite image not attached, placement could not be confirmed due to current cardiac rhythm.  US Abdomen Limited RUQ  Result Date: 08/12/2019 CLINICAL DATA:  Abnormal LFTs EXAM: ULTRASOUND ABDOMEN LIMITED RIGHT UPPER QUADRANT COMPARISON:  None. FINDINGS: Gallbladder: The gallbladder appears to be filled with slightly a hyperechoic sludge. No  sonographic Murphy sign. The gallbladder wall thickness measures 3 mm. No pericholecystic fluid. Common bile duct: Diameter: 5 mm Liver: No focal lesion identified. Within normal limits in parenchymal echogenicity. Portal vein is patent on color Doppler imaging with normal direction of blood flow towards the liver. Other: None. IMPRESSION: Gallbladder sludge.  No definite evidence of cholecystitis. Electronically Signed   By: Prudencio Pair M.D.   On: 08/12/2019 01:58     PERTINENT LAB RESULTS: CBC: Recent Labs    08/17/19 0950 08/19/19 0330  WBC 13.8* 15.4*  HGB  15.2 15.7  HCT 47.8 48.2  PLT 428* 437*   CMET CMP     Component Value Date/Time   NA 134 (L) 08/19/2019 0330   K 3.4 (L) 08/19/2019 0330   CL 96 (L) 08/19/2019 0330   CO2 30 08/19/2019 0330   GLUCOSE 49 (L) 08/19/2019 0330   BUN 22 (H) 08/19/2019 0330   CREATININE 1.17 08/19/2019 0330   CREATININE 1.19 08/13/2015 1231   CALCIUM 8.1 (L) 08/19/2019 0330   PROT 6.1 (L) 08/17/2019 0950   ALBUMIN 2.2 (L) 08/17/2019 0950   AST 28 08/17/2019 0950   ALT 8 08/17/2019 0950   ALKPHOS 65 08/17/2019 0950   BILITOT 0.8 08/17/2019 0950   GFRNONAA >60 08/19/2019 0330   GFRAA >60 08/19/2019 0330    GFR Estimated Creatinine Clearance: 76.7 mL/min (by C-G formula based on SCr of 1.17 mg/dL). No results for input(s): LIPASE, AMYLASE in the last 72 hours. No results for input(s): CKTOTAL, CKMB, CKMBINDEX, TROPONINI in the last 72 hours. Invalid input(s): POCBNP No results for input(s): DDIMER in the last 72 hours. No results for input(s): HGBA1C in the last 72 hours. No results for input(s): CHOL, HDL, LDLCALC, TRIG, CHOLHDL, LDLDIRECT in the last 72 hours. No results for input(s): TSH, T4TOTAL, T3FREE, THYROIDAB in the last 72 hours.  Invalid input(s): FREET3 No results for input(s): VITAMINB12, FOLATE, FERRITIN, TIBC, IRON, RETICCTPCT in the last 72 hours. Coags: No results for input(s): INR in the last 72 hours.  Invalid  input(s): PT Microbiology: Recent Results (from the past 240 hour(s))  Blood Culture (routine x 2)     Status: Abnormal   Collection Time: 08/11/19  3:58 PM   Specimen: BLOOD RIGHT ARM  Result Value Ref Range Status   Specimen Description BLOOD RIGHT ARM  Final   Special Requests   Final    BOTTLES DRAWN AEROBIC AND ANAEROBIC Blood Culture adequate volume   Culture  Setup Time   Final    GRAM POSITIVE COCCI IN CLUSTERS ANAEROBIC BOTTLE ONLY CRITICAL RESULT CALLED TO, READ BACK BY AND VERIFIED WITHDion Body PHARMD 1826 08/12/19 A BROWNING Performed at [facility], [address] [place], [place] [address]    Culture STAPHYLOCOCCUS AUREUS (A)  Final   Report Status 08/14/2019 FINAL  Final   Organism ID, Bacteria STAPHYLOCOCCUS AUREUS  Final      Susceptibility   Staphylococcus aureus - MIC*    CIPROFLOXACIN <=0.5 SENSITIVE Sensitive     ERYTHROMYCIN >=8 RESISTANT Resistant     GENTAMICIN <=0.5 SENSITIVE Sensitive     OXACILLIN 0.5 SENSITIVE Sensitive     TETRACYCLINE <=1 SENSITIVE Sensitive     VANCOMYCIN 1 SENSITIVE Sensitive     TRIMETH/SULFA <=10 SENSITIVE Sensitive     CLINDAMYCIN <=0.25 SENSITIVE Sensitive     RIFAMPIN <=0.5 SENSITIVE Sensitive     Inducible Clindamycin NEGATIVE Sensitive     * STAPHYLOCOCCUS AUREUS  Blood Culture ID Panel (Reflexed)     Status: Abnormal   Collection Time: 08/11/19  3:58 PM  Result Value Ref Range Status   Enterococcus species NOT DETECTED NOT DETECTED Final   Listeria monocytogenes NOT DETECTED NOT DETECTED Final   Staphylococcus species DETECTED (A) NOT DETECTED Final    Comment: CRITICAL RESULT CALLED TO, READ BACK BY AND VERIFIED WITHDion Body PHARMD 1826 08/12/19 A BROWNING    Staphylococcus aureus (BCID) DETECTED (A) NOT DETECTED Final    Comment: Methicillin (oxacillin) susceptible Staphylococcus aureus (MSSA). Preferred therapy is anti  staphylococcal beta lactam antibiotic (Cefazolin or Nafcillin), unless clinically  contraindicated. CRITICAL RESULT CALLED TO, READ BACK BY AND VERIFIED WITH: Dion Body PHARMD 1826 08/12/19 A BROWNING    Methicillin resistance NOT DETECTED NOT DETECTED Final   Streptococcus species NOT DETECTED NOT DETECTED Final   Streptococcus agalactiae NOT DETECTED NOT DETECTED Final   Streptococcus pneumoniae NOT DETECTED NOT DETECTED Final   Streptococcus pyogenes NOT DETECTED NOT DETECTED Final   Acinetobacter baumannii NOT DETECTED NOT DETECTED Final   Enterobacteriaceae species NOT DETECTED NOT DETECTED Final   Enterobacter cloacae complex NOT DETECTED NOT DETECTED Final   Escherichia coli NOT DETECTED NOT DETECTED Final   Klebsiella oxytoca NOT DETECTED NOT DETECTED Final   Klebsiella pneumoniae NOT DETECTED NOT DETECTED Final   Proteus species NOT DETECTED NOT DETECTED Final   Serratia marcescens NOT DETECTED NOT DETECTED Final   Haemophilus influenzae NOT DETECTED NOT DETECTED Final   Neisseria meningitidis NOT DETECTED NOT DETECTED Final   Pseudomonas aeruginosa NOT DETECTED NOT DETECTED Final   Candida albicans NOT DETECTED NOT DETECTED Final   Candida glabrata NOT DETECTED NOT DETECTED Final   Candida krusei NOT DETECTED NOT DETECTED Final   Candida parapsilosis NOT DETECTED NOT DETECTED Final   Candida tropicalis NOT DETECTED NOT DETECTED Final    Comment: Performed at [facility], [place] [address], [place], [place] [address]  Urine culture     Status: None   Collection Time: 08/11/19  9:06 PM   Specimen: In/Out Cath Urine  Result Value Ref Range Status   Specimen Description IN/OUT CATH URINE  Final   Special Requests NONE  Final   Culture   Final    NO GROWTH Performed at [facility], [place] [address], [place], [place] [address]    Report Status 08/14/2019 FINAL  Final  Culture, blood (routine x 2)     Status: None   Collection Time: 08/14/19  6:41 AM   Specimen: BLOOD  Result Value Ref Range Status   Specimen Description BLOOD RIGHT ANTECUBITAL   Final   Special Requests   Final    BOTTLES DRAWN AEROBIC AND ANAEROBIC Blood Culture results may not be optimal due to an inadequate volume of blood received in culture bottles   Culture   Final    NO GROWTH 5 DAYS Performed at [facility], [place] [address] [place], [place] [address]    Report Status 08/19/2019 FINAL  Final  Culture, blood (routine x 2)     Status: None   Collection Time: 08/14/19  6:41 AM   Specimen: BLOOD RIGHT HAND  Result Value Ref Range Status   Specimen Description BLOOD RIGHT HAND  Final   Special Requests   Final    BOTTLES DRAWN AEROBIC AND ANAEROBIC Blood Culture results may not be optimal due to an inadequate volume of blood received in culture bottles   Culture   Final    NO GROWTH 5 DAYS Performed at [facility], [place] [address] [place], [place] [address]    Report Status 08/19/2019 FINAL  Final    FURTHER DISCHARGE INSTRUCTIONS:  Get Medicines reviewed and adjusted: Please take all your medications with you for your next visit with your Primary MD  Laboratory/radiological data: Please request your Primary MD to go over all hospital tests and procedure/radiological results at the follow up, please ask your Primary MD to get all Hospital records sent to his/her office.  In some cases, they will be  blood work, cultures and biopsy results pending at the time of your discharge. Please request that your primary care M.D. goes through all the records of your hospital data and follows up on these results.  Also Note the following: If you experience worsening of your admission symptoms, develop shortness of breath, life threatening emergency, suicidal or homicidal thoughts you must seek medical attention immediately by calling 911 or calling your MD immediately  if symptoms less severe.  You must read complete instructions/literature along with all the possible adverse reactions/side effects for all the Medicines you take and that have been  prescribed to you. Take any new Medicines after you have completely understood and accpet all the possible adverse reactions/side effects.   Do not drive when taking Pain medications or sleeping medications (Benzodaizepines)  Do not take more than prescribed Pain, Sleep and Anxiety Medications. It is not advisable to combine anxiety,sleep and pain medications without talking with your primary care practitioner  Special Instructions: If you have smoked or chewed Tobacco  in the last 2 yrs please stop smoking, stop any regular Alcohol  and or any Recreational drug use.  Wear Seat belts while driving.  Please note: You were cared for by a hospitalist during your hospital stay. Once you are discharged, your primary care physician will handle any further medical issues. Please note that NO REFILLS for any discharge medications will be authorized once you are discharged, as it is imperative that you return to your primary care physician (or establish a relationship with a primary care physician if you do not have one) for your post hospital discharge needs so that they can reassess your need for medications and monitor your lab values.  Total Time spent coordinating discharge including counseling, education and face to face time equals 35 minutes.  SignedOren Binet 08/19/2019 11:25 AM

## 2019-08-19 NOTE — Progress Notes (Signed)
Pleas Patricia was D/C'd  Home per MD order. Discussed with the patient and all questions fully answered.   VVS, Skin clean, dry and intact without evidence of skin break down, no evidence of skin tears noted. Pt was D/Ced with PICC line. Site without signs and symptoms of complications. An After Visit Summary was printed and given to patient and spouse.  Patient escorted via WC, and D/C home via private auto.  Dickie La  08/19/2019 6:27 PM

## 2019-08-19 NOTE — Care Management (Signed)
    Durable Medical Equipment  (From admission, onward)         Start     Ordered   08/19/19 1326  For home use only DME standard manual wheelchair with seat cushion  Once    Comments: Patient suffers from severe physical deconditioning which impairs their ability to perform daily activities like bathing, dressing, feeding, grooming and toileting in the home.  A cane, crutch or walker will not resolve issue with performing activities of daily living. A wheelchair will allow patient to safely perform daily activities. Patient can safely propel the wheelchair in the home or has a caregiver who can provide assistance. Length of need 6 months . Accessories: elevating leg rests (ELRs), wheel locks, extensions and anti-tippers.   08/19/19 1325

## 2019-08-21 ENCOUNTER — Inpatient Hospital Stay (HOSPITAL_COMMUNITY): Payer: Medicaid Other

## 2019-08-22 DIAGNOSIS — R7989 Other specified abnormal findings of blood chemistry: Secondary | ICD-10-CM

## 2019-08-22 DIAGNOSIS — J15211 Pneumonia due to Methicillin susceptible Staphylococcus aureus: Secondary | ICD-10-CM

## 2019-08-22 DIAGNOSIS — R7881 Bacteremia: Secondary | ICD-10-CM

## 2019-08-22 SURGERY — ECHOCARDIOGRAM, TRANSESOPHAGEAL
Anesthesia: General

## 2019-09-08 ENCOUNTER — Encounter: Payer: Self-pay | Admitting: Internal Medicine

## 2019-09-08 ENCOUNTER — Ambulatory Visit
Admission: RE | Admit: 2019-09-08 | Discharge: 2019-09-08 | Disposition: A | Discharge status: Home / Self Care | Payer: Medicaid Other | Source: Ambulatory Visit | Attending: Internal Medicine | Admitting: Internal Medicine

## 2019-09-08 ENCOUNTER — Ambulatory Visit (INDEPENDENT_AMBULATORY_CARE_PROVIDER_SITE_OTHER): Payer: Medicaid Other | Admitting: Internal Medicine

## 2019-09-08 ENCOUNTER — Other Ambulatory Visit: Payer: Self-pay

## 2019-09-08 VITALS — BP 110/74 | HR 86 | Temp 98.4°F | Wt 245.6 lb

## 2019-09-08 DIAGNOSIS — J15211 Pneumonia due to Methicillin susceptible Staphylococcus aureus: Secondary | ICD-10-CM | POA: Diagnosis not present

## 2019-09-08 DIAGNOSIS — R0602 Shortness of breath: Secondary | ICD-10-CM | POA: Diagnosis not present

## 2019-09-08 DIAGNOSIS — J849 Interstitial pulmonary disease, unspecified: Secondary | ICD-10-CM

## 2019-09-08 NOTE — Progress Notes (Signed)
RFV: follow up for MSSA bacteremia with pneumonia 2/2 COVID-19  Patient ID: Kevin Long, male   DOB: 08/26/1960, 59 y.o.   MRN: [id number]  HPI  Kevin Long a 59 y.o.malewith covid-19 c/b PE, post-obstructive pneumonia, pulmonary abscess with mssa bacteremia. He was initially seen in the hospital with the plan to treat as complicated bacteremia, unable to rule out endocarditis since unable to do TEE while isolated, but TTE did not show any vegetations. He states that he is doing well though does become short of breath with ambulation. He checks his pulse ox often. At rest, he is low 90s and drops to mid 80s with some moderate exertion. He is not having difficulties with picc line he is to be on  abtx through June 27th. Has not followed up with pulmonary  He feels short of breath with activity and heat  Pulse ox at 93%, during exam. Had CXR done during visit - improvement in RUL process but still opacities in RLL Outpatient Encounter Medications as of 09/08/2019  Medication Sig   albuterol (PROVENTIL) (2.5 MG/3ML) 0.083% nebulizer solution Take 2.5 mg by nebulization every 6 (six) hours as needed for wheezing or shortness of breath.   albuterol (VENTOLIN HFA) 108 (90 Base) MCG/ACT inhaler Inhale 2 puffs into the lungs 4 (four) times daily as needed for wheezing or shortness of breath.    carvedilol (COREG) 6.25 MG tablet Take 1 tablet (6.25 mg total) by mouth 2 (two) times daily with a meal.   Cholecalciferol (VITAMIN D) 50 MCG (2000 UT) tablet Take 2,000 Units by mouth daily.   furosemide (LASIX) 40 MG tablet Take 40 mg by mouth daily as needed (leg swelling).    insulin aspart (NOVOLOG FLEXPEN) 100 UNIT/ML FlexPen 0-20 Units, Subcutaneous, 3 times daily with meals CBG < 70: Implement Hypoglycemia measures CBG 70 - 120: 0 units CBG 121 - 150: 3 units CBG 151 - 200: 4 units CBG 201 - 250: 7 units CBG 251 - 300: 11 units CBG 301 - 350: 15 units CBG 351 - 400: 20 units CBG > 400: call  MD   insulin glargine (LANTUS SOLOSTAR) 100 UNIT/ML Solostar Pen Inject 28 Units into the skin at bedtime.   acetaminophen (TYLENOL) 500 MG tablet Take 1,000 mg by mouth every 6 (six) hours as needed for headache (pain). (Patient not taking: Reported on 09/08/2019)   APIXABAN (ELIQUIS) VTE STARTER PACK (10MG AND 5MG) Take as directed on package: start with two-40m tablets twice daily for 7 days. On day 8 (08/23/19), switch to one-550mtablet twice daily. (Patient not taking: Reported on 09/08/2019)   Ascorbic Acid (VITAMIN C) 1000 MG tablet Take 1,000 mg by mouth daily. (Patient not taking: Reported on 09/08/2019)   ceFAZolin (ANCEF) IVPB Inject 2 g into the vein every 8 (eight) hours. Indication:  MSSA bacteremia/ post-obstructive pneumonia First Dose: Yes Last Day of Therapy:  09/23/2019 Labs - Once weekly:  CBC/D and BMP, Labs - Every other week:  ESR and CRP Method of administration: IV Push Method of administration may be changed at the discretion of home infusion pharmacist based upon assessment of the patient and/or caregiver's ability to self-administer the medication ordered. (Patient not taking: Reported on 09/08/2019)   Flaxseed, Linseed, (FLAX SEEDS PO) Take 1 capsule by mouth daily. (Patient not taking: Reported on 09/08/2019)   gabapentin (NEURONTIN) 100 MG capsule Take 1 capsule (100 mg total) by mouth 3 (three) times daily. (Patient not taking: Reported on 09/08/2019)  isosorbide-hydrALAZINE (BIDIL) 20-37.5 MG tablet Take 1 tablet by mouth 3 (three) times daily. (Patient not taking: Reported on 09/08/2019)   Zinc 50 MG TABS Take 50 mg by mouth daily. (Patient not taking: Reported on 09/08/2019)   No facility-administered encounter medications on file as of 09/08/2019.     Patient Active Problem List   Diagnosis Date Noted   Abnormal LFTs    MSSA bacteremia    Pneumonia of right upper lobe due to methicillin susceptible Staphylococcus aureus (MSSA) ([place])    Uncontrolled type  2 diabetes mellitus with hyperglycemia, with long-term current use of insulin ([place]) 70/96/2836   Acute metabolic encephalopathy 62/94/7654   Acute respiratory failure with hypoxemia ([place]) 08/12/2019   Lactic acidosis 08/12/2019   Acute pulmonary embolism without acute cor pulmonale ([place]) 08/12/2019   Sepsis due to COVID-19 [facility]) 08/12/2019   AKI (acute kidney injury) ([place]) 08/12/2019   Mass of upper lobe of right lung 08/12/2019   Chronic combined systolic and diastolic congestive heart failure ([place]) 09/25/2016   Vertigo 09/25/2016   PVC's (premature ventricular contractions) 03/28/2016   Nonischemic cardiomyopathy ([place]) 08/13/2015   Palpitations 08/13/2015   Obesity, unspecified 04/21/2013   Other primary cardiomyopathies    DIABETES MELLITUS 02/09/2007   Hyperlipidemia 02/09/2007   OBSTRUCTIVE SLEEP APNEA 02/09/2007   Essential hypertension 02/09/2007   ANGINA, STABLE 02/09/2007   ASTHMA 02/09/2007     Health Maintenance Due  Topic Date Due   Hepatitis C Screening  Never done   PNEUMOCOCCAL POLYSACCHARIDE VACCINE AGE 46-64 HIGH RISK  Never done   FOOT EXAM  Never done   OPHTHALMOLOGY EXAM  Never done   URINE MICROALBUMIN  Never done   COVID-19 Vaccine (1) Never done   TETANUS/TDAP  Never done   COLONOSCOPY  Never done     Review of Systems 12 point ros is negative except what is mentioned above Physical Exam   BP 110/74    Pulse 86    Temp 98.4 F (36.9 C)    Wt 254 lb 6.4 oz (115.4 kg)    SpO2 92%    BMI 41.06 kg/m   Physical Exam  Constitutional: He is oriented to person, place, and time. He appears well-developed and well-nourished. No distress.  HENT:  Mouth/Throat: Oropharynx is clear and moist. No oropharyngeal exudate.  Cardiovascular: Normal rate, regular rhythm and normal heart sounds. Exam reveals no gallop and no friction rub.  No murmur heard.  Pulmonary/Chest: Effort normal and breath sounds normal. No respiratory distress. He  has no wheezes.  YTK:PTWSF arm picc line is c/d/i Neurological: He is alert and oriented to person, place, and time.  Skin: Skin is warm and dry. No rash noted. No erythema.  Psychiatric: He has a normal mood and affect. His behavior is normal.    CBC Lab Results  Component Value Date   WBC 15.4 (H) 08/19/2019   RBC 5.78 08/19/2019   HGB 15.7 08/19/2019   HCT 48.2 08/19/2019   PLT 437 (H) 08/19/2019   MCV 83.4 08/19/2019   MCH 27.2 08/19/2019   MCHC 32.6 08/19/2019   RDW 15.6 (H) 08/19/2019   LYMPHSABS 1.0 08/16/2019   MONOABS 0.6 08/16/2019   EOSABS 0.0 08/16/2019    BMET Lab Results  Component Value Date   NA 134 (L) 08/19/2019   K 3.4 (L) 08/19/2019   CL 96 (L) 08/19/2019   CO2 30 08/19/2019   GLUCOSE 49 (L) 08/19/2019   BUN 22 (H) 08/19/2019   CREATININE 1.17  08/19/2019   CALCIUM 8.1 (L) 08/19/2019   GFRNONAA >60 08/19/2019   GFRAA >60 08/19/2019      Assessment and Plan  MSSA bacteremia = getting treated as complicated bacteremia with pneumonia, will treat for 6 wk which finished in roughly 17 days  COVID-19 pneumonia c/b MSSA pneumonia/abscess vs lung mass = plan to continue with cefazolin. There was question to whether he had post obstructive pneumonia earlier in his hospitalization. Not unusual to see abn cx at 1 month out. Will still refer to pulmonary to see if need to have supllemental oxygen, repeat chest ct, optimization of inhalers.  Chest xray improve in RUL - suspect that would be c/w abscess but lateral cxr shows prominent vascular markings and still wonder if has perihilar lymphadenopathy.  Will have chest CT repeated so that when seen by pulmonary can decide if need to do bronch and tissue biopsy.

## 2019-09-11 ENCOUNTER — Encounter (HOSPITAL_COMMUNITY): Payer: Self-pay

## 2019-09-11 ENCOUNTER — Other Ambulatory Visit: Payer: Self-pay

## 2019-09-11 ENCOUNTER — Emergency Department (HOSPITAL_COMMUNITY)
Admission: EM | Admit: 2019-09-11 | Discharge: 2019-09-11 | Disposition: A | Discharge status: Home / Self Care | Payer: No Typology Code available for payment source | Attending: Emergency Medicine | Admitting: Emergency Medicine

## 2019-09-11 DIAGNOSIS — Z5321 Procedure and treatment not carried out due to patient leaving prior to being seen by health care provider: Secondary | ICD-10-CM | POA: Diagnosis not present

## 2019-09-11 DIAGNOSIS — Z4801 Encounter for change or removal of surgical wound dressing: Secondary | ICD-10-CM | POA: Insufficient documentation

## 2019-09-11 NOTE — ED Triage Notes (Signed)
Patient arrived stating on Tuesday he had his dressing for his picc line changed and the adhesive and glue the nurse used caused some burning. Patient has some bleeding above dressing. States it only hurts now when touching it.

## 2019-09-12 ENCOUNTER — Telehealth: Payer: Self-pay

## 2019-09-12 NOTE — Telephone Encounter (Signed)
Patient called triage to report bleeding "above Port site". Patient has PICC line and stated that the bleeding is above the site...when questioned about what he meant, he was unable to describe where it was bleeding. Patient was able to place a bandaid on the bleeding so it could be outside of the dressing. Patient has home health RN coming today for dressing change and they will assess. Will follow up as needed.   Brysin Towery Loyola Mast, RN

## 2019-09-15 ENCOUNTER — Encounter: Payer: Self-pay | Admitting: Pulmonary Disease

## 2019-09-15 ENCOUNTER — Ambulatory Visit (INDEPENDENT_AMBULATORY_CARE_PROVIDER_SITE_OTHER): Payer: No Typology Code available for payment source | Admitting: Pulmonary Disease

## 2019-09-15 ENCOUNTER — Other Ambulatory Visit: Payer: Self-pay

## 2019-09-15 VITALS — BP 128/62 | HR 88 | Temp 98.1°F | Ht 66.0 in | Wt 250.2 lb

## 2019-09-15 DIAGNOSIS — J1282 Pneumonia due to coronavirus disease 2019: Secondary | ICD-10-CM | POA: Diagnosis not present

## 2019-09-15 DIAGNOSIS — U071 COVID-19: Secondary | ICD-10-CM

## 2019-09-15 DIAGNOSIS — J15211 Pneumonia due to Methicillin susceptible Staphylococcus aureus: Secondary | ICD-10-CM

## 2019-09-15 NOTE — Patient Instructions (Signed)
MSSA pneumonia on antibiotics  Chest x-ray looks much better CT scan is pending  Oxygen desaturations with activity  We will ambulate you today to see whether you need oxygen Likely course of oxygen requirement may be heart failure with ejection fraction of 20%  We will get a breathing study on you  We will follow up on the CT scan that scheduled for the 24th  Shoulder pain-over-the-counter medications  Continue albuterol as needed   Follow-up in 4 weeks

## 2019-09-15 NOTE — Progress Notes (Addendum)
Kevin Long    [id number]    [birth year]  Primary Care Physician:Avbuere, Christean Grief, MD  Referring Physician: Carlyle Basques, MD [name],  [place] [address]  Chief complaint:   Patient with a history of MSSA pneumonia been treated with antibiotics  HPI:  Recent hospitalization for MSSA pneumonia On antibiotics Antibiotics to complete on 624 Shortness of breath on exertion with oxygen desaturations  Chest x-ray shows significant improvement over the last 4 weeks  He has a CT scan scheduled for 24th  He has history of heart failure with ejection fraction of 20 to 25%  Reformed smoker  History of PE, PVC, nonischemic cardiomyopathy, stable angina Obstructive sleep apnea, diabetes  He does not have a level of lung disease Has been using albuterol which seems to help, only using albuterol about once every other day  Outpatient Encounter Medications as of 09/15/2019  Medication Sig  . acetaminophen (TYLENOL) 500 MG tablet Take 1,000 mg by mouth every 6 (six) hours as needed for headache (pain).   Marland Kitchen albuterol (PROVENTIL) (2.5 MG/3ML) 0.083% nebulizer solution Take 2.5 mg by nebulization every 6 (six) hours as needed for wheezing or shortness of breath.  Marland Kitchen albuterol (VENTOLIN HFA) 108 (90 Base) MCG/ACT inhaler Inhale 2 puffs into the lungs 4 (four) times daily as needed for wheezing or shortness of breath.   . APIXABAN (ELIQUIS) VTE STARTER PACK (10MG AND 5MG) Take as directed on package: start with two-67m tablets twice daily for 7 days. On day 8 (08/23/19), switch to one-579mtablet twice daily.  . Ascorbic Acid (VITAMIN C) 1000 MG tablet Take 1,000 mg by mouth daily.   . carvedilol (COREG) 6.25 MG tablet Take 1 tablet (6.25 mg total) by mouth 2 (two) times daily with a meal.  . ceFAZolin (ANCEF) IVPB Inject 2 g into the vein every 8 (eight) hours. Indication:  MSSA bacteremia/ post-obstructive pneumonia First Dose: Yes Last Day of Therapy:   09/23/2019 Labs - Once weekly:  CBC/D and BMP, Labs - Every other week:  ESR and CRP Method of administration: IV Push Method of administration may be changed at the discretion of home infusion pharmacist based upon assessment of the patient and/or caregiver's ability to self-administer the medication ordered.  . Cholecalciferol (VITAMIN D) 50 MCG (2000 UT) tablet Take 2,000 Units by mouth daily.  . Flaxseed, Linseed, (FLAX SEEDS PO) Take 1 capsule by mouth daily.   . furosemide (LASIX) 40 MG tablet Take 40 mg by mouth daily as needed (leg swelling).   . gabapentin (NEURONTIN) 100 MG capsule Take 1 capsule (100 mg total) by mouth 3 (three) times daily.  . insulin aspart (NOVOLOG FLEXPEN) 100 UNIT/ML FlexPen 0-20 Units, Subcutaneous, 3 times daily with meals CBG < 70: Implement Hypoglycemia measures CBG 70 - 120: 0 units CBG 121 - 150: 3 units CBG 151 - 200: 4 units CBG 201 - 250: 7 units CBG 251 - 300: 11 units CBG 301 - 350: 15 units CBG 351 - 400: 20 units CBG > 400: call MD  . insulin glargine (LANTUS SOLOSTAR) 100 UNIT/ML Solostar Pen Inject 28 Units into the skin at bedtime.  . isosorbide-hydrALAZINE (BIDIL) 20-37.5 MG tablet Take 1 tablet by mouth 3 (three) times daily.  . Zinc 50 MG TABS Take 50 mg by mouth daily.    No facility-administered encounter medications on file as of 09/15/2019.    Allergies as of 09/15/2019 - Review Complete 09/15/2019  Allergen Reaction Noted  . Celebrex [celecoxib] Swelling 12/21/2015  . Entresto [sacubitril-valsartan] Other (See Comments) 12/02/2016    Past Medical History:  Diagnosis Date  . Asthma   . Diabetes mellitus, type 2 ([place])   . Hypercholesterolemia   . Hypertension   . Obesity   . OSA (obstructive sleep apnea)   . Other primary cardiomyopathies     Past Surgical History:  Procedure Laterality Date  . FOOT SURGERY     has pin and screws  . LEFT AND RIGHT HEART CATHETERIZATION WITH CORONARY ANGIOGRAM N/A 08/04/2012   Procedure: LEFT  AND RIGHT HEART CATHETERIZATION WITH CORONARY ANGIOGRAM;  Surgeon: Jettie Booze, MD;  Location: [facility] CATH LAB;  Service: Cardiovascular;  Laterality: N/A;    Family History  Problem Relation Age of Onset  . Diabetes Mother   . Anxiety disorder Brother   . Depression Son   . Heart murmur Son   . Heart attack Neg Hx   . Hypertension Neg Hx   . Stroke Neg Hx     Social History   Socioeconomic History  . Marital status: Single    Spouse name: Not on file  . Number of children: Not on file  . Years of education: Not on file  . Highest education level: Not on file  Occupational History  . Not on file  Tobacco Use  . Smoking status: Former Smoker    Packs/day: 1.00    Years: 25.00    Pack years: 25.00    Types: Cigarettes    Quit date: 03/31/2005    Years since quitting: 14.4  . Smokeless tobacco: Never Used  Substance and Sexual Activity  . Alcohol use: No  . Drug use: No  . Sexual activity: Not on file  Other Topics Concern  . Not on file  Social History Narrative  . Not on file   Social Determinants of Health   Financial Resource Strain:   . Difficulty of Paying Living Expenses:   Food Insecurity:   . Worried About Charity fundraiser in the Last Year:   . Arboriculturist in the Last Year:   Transportation Needs:   . Film/video editor (Medical):   Marland Kitchen Lack of Transportation (Non-Medical):   Physical Activity:   . Days of Exercise per Week:   . Minutes of Exercise per Session:   Stress:   . Feeling of Stress :   Social Connections:   . Frequency of Communication with Friends and Family:   . Frequency of Social Gatherings with Friends and Family:   . Attends Religious Services:   . Active Member of Clubs or Organizations:   . Attends Archivist Meetings:   Marland Kitchen Marital Status:   Intimate Partner Violence:   . Fear of Current or Ex-Partner:   . Emotionally Abused:   Marland Kitchen Physically Abused:   . Sexually Abused:     Review of Systems    Constitutional: Positive for fatigue.  HENT: Negative.   Respiratory: Positive for shortness of breath. Negative for cough.   Cardiovascular: Negative for chest pain.  Musculoskeletal: Positive for arthralgias.    Vitals:   09/15/19 1042  BP: 128/62  Pulse: 88  Temp: 98.1 F (36.7 C)  SpO2: 92%     Physical Exam Constitutional:      Appearance: He is obese.  HENT:     Head: Normocephalic.     Nose: Nose normal. No congestion.     Mouth/Throat:  Mouth: Mucous membranes are moist.     Pharynx: No posterior oropharyngeal erythema.  Eyes:     Conjunctiva/sclera: Conjunctivae normal.  Cardiovascular:     Rate and Rhythm: Normal rate and regular rhythm.     Pulses: Normal pulses.     Heart sounds: Normal heart sounds. No murmur heard.  No friction rub.  Pulmonary:     Effort: Pulmonary effort is normal. No respiratory distress.     Breath sounds: Normal breath sounds. No stridor. No wheezing or rhonchi.  Musculoskeletal:     Cervical back: No rigidity or tenderness.  Skin:    General: Skin is warm.  Neurological:     General: No focal deficit present.     Mental Status: He is alert.      Data Reviewed: Chest x-ray reviewed with the patient and compared with a month ago CT scan reviewed showing extensive pneumonic process  Recent echocardiogram with ejection fraction of 20 percent percent  Patient had an ambulatory oximetry performed showing very desaturated, requiring 3 L to keep saturations acceptable  Dr. Crissie Figures notes reviewed  Recent hospital records reviewed Assessment:  Hypoxemic respiratory failure  Exercise desaturations  Multifocal pneumonia  MSSA pneumonia -Completing course of antibiotics  Abnormal CT scan showing extensive infiltrative process and adenopathy  Plan/Recommendations: With a significant improvement in chest x-ray findings, I do expect the CT scan to also reflect significant improvement We will follow up on CT  scan  Exercise oximetry did reveal desaturations -3 L of oxygen with ambulation -Patient encouraged to continue monitoring his oxygen on a regular basis  We will obtain a pulmonary function test  Continue albuterol use as needed  Regular graded exercises encouraged  Follow-up in 4 to 6 weeks    Sherrilyn Rist MD [facility] 09/15/2019, 11:14 AM  CC: Carlyle Basques, MD   Echocardiogram 08/12/2019: Patient's echocardiogram reviewed showing biventricular heart failure, ejection fraction of 20% with global hypokinesis, moderately enlarged right ventricular size This is likely contributing to significant pulmonary congestion and may be contributing to patient's need for oxygen supplementation  Biventricular heart failure

## 2019-09-16 ENCOUNTER — Telehealth: Payer: Self-pay | Admitting: Pulmonary Disease

## 2019-09-16 DIAGNOSIS — I5020 Unspecified systolic (congestive) heart failure: Secondary | ICD-10-CM

## 2019-09-16 DIAGNOSIS — I5082 Biventricular heart failure: Secondary | ICD-10-CM

## 2019-09-16 NOTE — Telephone Encounter (Signed)
Dr. Val Eagle,  Please advise another dx that we can use for pt's O2?

## 2019-09-19 NOTE — Telephone Encounter (Signed)
Dr. Wynona Neat please advise on another diagnoses we can use

## 2019-09-19 NOTE — Telephone Encounter (Signed)
I addended the note

## 2019-09-19 NOTE — Telephone Encounter (Signed)
Insurance requires that the office notes mention a approved diagnosis/code. They will not cover pneumonia. Could you ammend your note to include Systolic heart failure Biventricular heart failure and we will reorder the oxygen POC. Thank you, please return to triage.

## 2019-09-19 NOTE — Telephone Encounter (Signed)
Systolic heart failure Biventricular heart failure

## 2019-09-20 ENCOUNTER — Telehealth: Payer: Self-pay | Admitting: Pulmonary Disease

## 2019-09-20 DIAGNOSIS — I5082 Biventricular heart failure: Secondary | ICD-10-CM

## 2019-09-20 DIAGNOSIS — J9601 Acute respiratory failure with hypoxia: Secondary | ICD-10-CM

## 2019-09-20 DIAGNOSIS — I5020 Unspecified systolic (congestive) heart failure: Secondary | ICD-10-CM

## 2019-09-20 NOTE — Telephone Encounter (Signed)
Spoke with Rod Holler at [facility]. She advised what needed to be corrected on the oxygen order. This has been updated. Nothing further was needed.

## 2019-09-22 ENCOUNTER — Other Ambulatory Visit: Payer: Self-pay

## 2019-09-22 ENCOUNTER — Ambulatory Visit
Admission: RE | Admit: 2019-09-22 | Discharge: 2019-09-22 | Disposition: A | Discharge status: Home / Self Care | Payer: Medicaid Other | Source: Ambulatory Visit | Attending: Internal Medicine | Admitting: Internal Medicine

## 2019-09-22 DIAGNOSIS — J849 Interstitial pulmonary disease, unspecified: Secondary | ICD-10-CM

## 2019-09-22 DIAGNOSIS — R0602 Shortness of breath: Secondary | ICD-10-CM

## 2019-09-25 ENCOUNTER — Emergency Department (HOSPITAL_COMMUNITY)
Admission: EM | Admit: 2019-09-25 | Discharge: 2019-09-25 | Disposition: A | Discharge status: Home / Self Care | Payer: No Typology Code available for payment source | Attending: Emergency Medicine | Admitting: Emergency Medicine

## 2019-09-25 ENCOUNTER — Other Ambulatory Visit: Payer: Self-pay

## 2019-09-25 ENCOUNTER — Encounter (HOSPITAL_COMMUNITY): Payer: Self-pay | Admitting: Emergency Medicine

## 2019-09-25 DIAGNOSIS — Z87891 Personal history of nicotine dependence: Secondary | ICD-10-CM | POA: Diagnosis not present

## 2019-09-25 DIAGNOSIS — I10 Essential (primary) hypertension: Secondary | ICD-10-CM | POA: Diagnosis not present

## 2019-09-25 DIAGNOSIS — J45909 Unspecified asthma, uncomplicated: Secondary | ICD-10-CM | POA: Insufficient documentation

## 2019-09-25 DIAGNOSIS — Z79899 Other long term (current) drug therapy: Secondary | ICD-10-CM | POA: Diagnosis not present

## 2019-09-25 DIAGNOSIS — Z794 Long term (current) use of insulin: Secondary | ICD-10-CM | POA: Insufficient documentation

## 2019-09-25 DIAGNOSIS — Z452 Encounter for adjustment and management of vascular access device: Secondary | ICD-10-CM | POA: Diagnosis not present

## 2019-09-25 DIAGNOSIS — E119 Type 2 diabetes mellitus without complications: Secondary | ICD-10-CM | POA: Insufficient documentation

## 2019-09-25 NOTE — ED Provider Notes (Signed)
[facility]-EMERGENCY DEPT Provider Note   CSN: [id number] Arrival date & time: 09/25/19  6314     History Chief Complaint  Patient presents with  . PICC removal    Kevin Long is a 59 y.o. male.  HPI 59 year old male who presents to the ER for PICC line removal after his nurse attempted to discontinue it and had some resistance pulling it out.  Patient had it placed back in May for MRSA bacteremia and pneumonia secondary to Covid.  He states he has finished his course of antibiotics and is doing well.  He was told that he needs to get the PICC line out.  He has no pain or swelling to the area, no fevers or chills.    Past Medical History:  Diagnosis Date  . Asthma   . Diabetes mellitus, type 2 (HCC)   . Hypercholesterolemia   . Hypertension   . Obesity   . OSA (obstructive sleep apnea)   . Other primary cardiomyopathies     Patient Active Problem List   Diagnosis Date Noted  . Abnormal LFTs   . MSSA bacteremia   . Pneumonia of right upper lobe due to methicillin susceptible Staphylococcus aureus (MSSA) (HCC)   . Uncontrolled type 2 diabetes mellitus with hyperglycemia, with long-term current use of insulin (HCC) 08/12/2019  . Acute metabolic encephalopathy 08/12/2019  . Acute respiratory failure with hypoxemia (HCC) 08/12/2019  . Lactic acidosis 08/12/2019  . Acute pulmonary embolism without acute cor pulmonale (HCC) 08/12/2019  . Sepsis due to COVID-19 (HCC) 08/12/2019  . AKI (acute kidney injury) (HCC) 08/12/2019  . Mass of upper lobe of right lung 08/12/2019  . Chronic combined systolic and diastolic congestive heart failure (HCC) 09/25/2016  . Vertigo 09/25/2016  . PVC's (premature ventricular contractions) 03/28/2016  . Nonischemic cardiomyopathy (HCC) 08/13/2015  . Palpitations 08/13/2015  . Obesity, unspecified 04/21/2013  . Other primary cardiomyopathies   . DIABETES MELLITUS 02/09/2007  . Hyperlipidemia 02/09/2007  . OBSTRUCTIVE  SLEEP APNEA 02/09/2007  . Essential hypertension 02/09/2007  . ANGINA, STABLE 02/09/2007  . ASTHMA 02/09/2007    Past Surgical History:  Procedure Laterality Date  . FOOT SURGERY     has pin and screws  . LEFT AND RIGHT HEART CATHETERIZATION WITH CORONARY ANGIOGRAM N/A 08/04/2012   Procedure: LEFT AND RIGHT HEART CATHETERIZATION WITH CORONARY ANGIOGRAM;  Surgeon: Corky Crafts, MD;  Location: [facility] CATH LAB;  Service: Cardiovascular;  Laterality: N/A;       Family History  Problem Relation Age of Onset  . Diabetes Mother   . Anxiety disorder Brother   . Depression Son   . Heart murmur Son   . Heart attack Neg Hx   . Hypertension Neg Hx   . Stroke Neg Hx     Social History   Tobacco Use  . Smoking status: Former Smoker    Packs/day: 1.00    Years: 25.00    Pack years: 25.00    Types: Cigarettes    Quit date: 03/31/2005    Years since quitting: 14.4  . Smokeless tobacco: Never Used  Substance Use Topics  . Alcohol use: No  . Drug use: No    Home Medications Prior to Admission medications   Medication Sig Start Date End Date Taking? Authorizing Provider  acetaminophen (TYLENOL) 500 MG tablet Take 1,000 mg by mouth every 6 (six) hours as needed for headache (pain).     [provider]  albuterol (PROVENTIL) (2.5 MG/3ML) 0.083% nebulizer solution  Take 2.5 mg by nebulization every 6 (six) hours as needed for wheezing or shortness of breath.    [provider]  albuterol (VENTOLIN HFA) 108 (90 Base) MCG/ACT inhaler Inhale 2 puffs into the lungs 4 (four) times daily as needed for wheezing or shortness of breath.     [provider]  APIXABAN Arne Cleveland) VTE STARTER PACK (10MG  AND 5MG ) Take as directed on package: start with two-5mg  tablets twice daily for 7 days. On day 8 (08/23/19), switch to one-5mg  tablet twice daily. 08/19/19   Ghimire, Henreitta Leber, MD  Ascorbic Acid (VITAMIN C) 1000 MG tablet Take 1,000 mg by mouth daily.     [provider]   carvedilol (COREG) 6.25 MG tablet Take 1 tablet (6.25 mg total) by mouth 2 (two) times daily with a meal. 08/19/19   Ghimire, Henreitta Leber, MD  Cholecalciferol (VITAMIN D) 50 MCG (2000 UT) tablet Take 2,000 Units by mouth daily.    [provider]  Flaxseed, Linseed, (FLAX SEEDS PO) Take 1 capsule by mouth daily.     [provider]  furosemide (LASIX) 40 MG tablet Take 40 mg by mouth daily as needed (leg swelling).     [provider]  gabapentin (NEURONTIN) 100 MG capsule Take 1 capsule (100 mg total) by mouth 3 (three) times daily. 08/19/19   Ghimire, Henreitta Leber, MD  insulin aspart (NOVOLOG FLEXPEN) 100 UNIT/ML FlexPen 0-20 Units, Subcutaneous, 3 times daily with meals CBG < 70: Implement Hypoglycemia measures CBG 70 - 120: 0 units CBG 121 - 150: 3 units CBG 151 - 200: 4 units CBG 201 - 250: 7 units CBG 251 - 300: 11 units CBG 301 - 350: 15 units CBG 351 - 400: 20 units CBG > 400: call MD 08/19/19   Jonetta Osgood, MD  insulin glargine (LANTUS SOLOSTAR) 100 UNIT/ML Solostar Pen Inject 28 Units into the skin at bedtime. 08/19/19   Ghimire, Henreitta Leber, MD  isosorbide-hydrALAZINE (BIDIL) 20-37.5 MG tablet Take 1 tablet by mouth 3 (three) times daily. 06/22/19   Adrian Prows, MD  Zinc 50 MG TABS Take 50 mg by mouth daily.     [provider]    Allergies    Celebrex [celecoxib] and Entresto [sacubitril-valsartan]  Review of Systems   Review of Systems  Constitutional: Negative for chills and fever.  Skin: Negative for color change, rash and wound.    Physical Exam Updated Vital Signs BP 119/82   Pulse 72   Temp 98.6 F (37 C) (Oral)   Resp 16   SpO2 100%   Physical Exam Vitals and nursing note reviewed.  Constitutional:      General: He is not in acute distress.    Appearance: Normal appearance. He is well-developed. He is not ill-appearing, toxic-appearing or diaphoretic.  HENT:     Head: Normocephalic and atraumatic.     Mouth/Throat:     Mouth:  Mucous membranes are moist.  Eyes:     Conjunctiva/sclera: Conjunctivae normal.     Pupils: Pupils are equal, round, and reactive to light.  Cardiovascular:     Rate and Rhythm: Normal rate and regular rhythm.     Pulses: Normal pulses.     Heart sounds: Normal heart sounds. No murmur heard.      Comments: 2+ radial pulses bilaterally Pulmonary:     Effort: Pulmonary effort is normal. No respiratory distress.     Breath sounds: Normal breath sounds.  Abdominal:     General: Abdomen  is flat.     Palpations: Abdomen is soft.     Tenderness: There is no abdominal tenderness.  Musculoskeletal:        General: No swelling, tenderness or deformity. Normal range of motion.     Cervical back: Normal range of motion and neck supple.  Skin:    General: Skin is warm and dry.     Coloration: Skin is not jaundiced or pale.     Findings: No bruising or erythema.          Comments: PICC line in right  Arm without evidence of surrounding erythema, drainage, swelling.  Neurological:     Mental Status: He is alert.     ED Results / Procedures / Treatments   Labs (all labs ordered are listed, but only abnormal results are displayed) Labs Reviewed - No data to display  EKG None  Radiology No results found.  Procedures Procedures (including critical care time)  Medications Ordered in ED Medications - No data to display  ED Course  I have reviewed the triage vital signs and the nursing notes.  Pertinent labs & imaging results that were available during my care of the patient were reviewed by me and considered in my medical decision making (see chart for details).    MDM Rules/Calculators/A&P                          Patient presents to the ER for PICC line removal.  Nursing staff removed PICC line with no complications.  There is no evidence of cellulitis or infection.  Instructed the patient to keep the area clean and dry, monitor for worsening swelling, redness, discharge,  fevers or chills.  I encouraged him to follow-up with Dr. Wynona Neat with pulmonology.  Return precautions given.  At this stage in the ED course, the patient has been adequately screened and is stable for discharge. Final Clinical Impression(s) / ED Diagnoses Final diagnoses:  Fitting and adjustment of vascular catheter    Rx / DC Orders ED Discharge Orders    None       Mare Ferrari, PA-C 09/25/19 1254    Mancel Bale, MD 09/25/19 1733

## 2019-09-25 NOTE — ED Notes (Signed)
IV team at bedside 

## 2019-09-25 NOTE — Discharge Instructions (Signed)
Please monitor the area for worsening redness, swelling, drainage, fevers, chills.  Please follow-up with Dr. Wynona Neat  regarding your further treatment.  Return to the ER if your symptoms worsen.

## 2019-09-25 NOTE — ED Triage Notes (Signed)
Per pt, states he has a right upper arm PICC line which was placed last month-states his [facility] RN attempted to dibcontinue it but had resistance when pulling it out-was told to come to ED to have it removed, no longer needs it

## 2019-09-29 ENCOUNTER — Ambulatory Visit: Payer: Medicaid Other | Admitting: Internal Medicine

## 2019-10-18 ENCOUNTER — Ambulatory Visit: Payer: Medicaid Other | Admitting: Internal Medicine

## 2019-10-26 ENCOUNTER — Ambulatory Visit: Payer: No Typology Code available for payment source | Admitting: Pulmonary Disease

## 2019-12-26 ENCOUNTER — Ambulatory Visit: Payer: Medicaid Other | Admitting: Cardiology

## 2020-08-21 ENCOUNTER — Telehealth: Payer: Self-pay

## 2020-08-21 NOTE — Telephone Encounter (Signed)
Pt needs appt. Progress notes in file from [facility]. Phone: [phone]. Copy of referral sent to scheduling.

## 2020-09-17 ENCOUNTER — Emergency Department (HOSPITAL_COMMUNITY): Payer: Medicaid Other

## 2020-09-17 ENCOUNTER — Emergency Department (HOSPITAL_COMMUNITY)
Admission: EM | Admit: 2020-09-17 | Discharge: 2020-09-18 | Disposition: A | Discharge status: Home / Self Care | Payer: Medicaid Other | Attending: Emergency Medicine | Admitting: Emergency Medicine

## 2020-09-17 ENCOUNTER — Other Ambulatory Visit: Payer: Self-pay

## 2020-09-17 ENCOUNTER — Encounter (HOSPITAL_COMMUNITY): Payer: Self-pay

## 2020-09-17 DIAGNOSIS — Z7901 Long term (current) use of anticoagulants: Secondary | ICD-10-CM | POA: Diagnosis not present

## 2020-09-17 DIAGNOSIS — E119 Type 2 diabetes mellitus without complications: Secondary | ICD-10-CM | POA: Diagnosis not present

## 2020-09-17 DIAGNOSIS — Z794 Long term (current) use of insulin: Secondary | ICD-10-CM | POA: Insufficient documentation

## 2020-09-17 DIAGNOSIS — I5042 Chronic combined systolic (congestive) and diastolic (congestive) heart failure: Secondary | ICD-10-CM | POA: Diagnosis not present

## 2020-09-17 DIAGNOSIS — Z86718 Personal history of other venous thrombosis and embolism: Secondary | ICD-10-CM | POA: Diagnosis not present

## 2020-09-17 DIAGNOSIS — Z87891 Personal history of nicotine dependence: Secondary | ICD-10-CM | POA: Insufficient documentation

## 2020-09-17 DIAGNOSIS — J45909 Unspecified asthma, uncomplicated: Secondary | ICD-10-CM | POA: Diagnosis not present

## 2020-09-17 DIAGNOSIS — I110 Hypertensive heart disease with heart failure: Secondary | ICD-10-CM | POA: Insufficient documentation

## 2020-09-17 DIAGNOSIS — Z79899 Other long term (current) drug therapy: Secondary | ICD-10-CM | POA: Diagnosis not present

## 2020-09-17 DIAGNOSIS — R319 Hematuria, unspecified: Secondary | ICD-10-CM | POA: Diagnosis not present

## 2020-09-17 DIAGNOSIS — Z955 Presence of coronary angioplasty implant and graft: Secondary | ICD-10-CM | POA: Insufficient documentation

## 2020-09-17 DIAGNOSIS — R0600 Dyspnea, unspecified: Secondary | ICD-10-CM | POA: Insufficient documentation

## 2020-09-17 DIAGNOSIS — R609 Edema, unspecified: Secondary | ICD-10-CM

## 2020-09-17 DIAGNOSIS — Z978 Presence of other specified devices: Secondary | ICD-10-CM

## 2020-09-17 DIAGNOSIS — R600 Localized edema: Secondary | ICD-10-CM | POA: Insufficient documentation

## 2020-09-17 LAB — CBC WITH DIFFERENTIAL/PLATELET
Abs Immature Granulocytes: 0.04 10*3/uL (ref 0.00–0.07)
Basophils Absolute: 0.1 10*3/uL (ref 0.0–0.1)
Basophils Relative: 1 %
Eosinophils Absolute: 0.2 10*3/uL (ref 0.0–0.5)
Eosinophils Relative: 2 %
HCT: 34.3 % — ABNORMAL LOW (ref 39.0–52.0)
Hemoglobin: 11.2 g/dL — ABNORMAL LOW (ref 13.0–17.0)
Immature Granulocytes: 1 %
Lymphocytes Relative: 14 %
Lymphs Abs: 1.1 10*3/uL (ref 0.7–4.0)
MCH: 29.2 pg (ref 26.0–34.0)
MCHC: 32.7 g/dL (ref 30.0–36.0)
MCV: 89.3 fL (ref 80.0–100.0)
Monocytes Absolute: 1 10*3/uL (ref 0.1–1.0)
Monocytes Relative: 12 %
Neutro Abs: 5.6 10*3/uL (ref 1.7–7.7)
Neutrophils Relative %: 70 %
Platelets: 315 10*3/uL (ref 150–400)
RBC: 3.84 MIL/uL — ABNORMAL LOW (ref 4.22–5.81)
RDW: 17.1 % — ABNORMAL HIGH (ref 11.5–15.5)
WBC: 7.9 10*3/uL (ref 4.0–10.5)
nRBC: 0 % (ref 0.0–0.2)

## 2020-09-17 LAB — URINALYSIS, ROUTINE W REFLEX MICROSCOPIC
Bacteria, UA: NONE SEEN
Bilirubin Urine: NEGATIVE
Glucose, UA: 50 mg/dL — AB
Ketones, ur: 5 mg/dL — AB
Leukocytes,Ua: NEGATIVE
Nitrite: NEGATIVE
Protein, ur: 100 mg/dL — AB
RBC / HPF: >50 RBC/hpf — ABNORMAL HIGH (ref 0–5)
Specific Gravity, Urine: 1.021 (ref 1.005–1.030)
pH: 5 (ref 5.0–8.0)

## 2020-09-17 LAB — BASIC METABOLIC PANEL
Anion gap: 11 (ref 5–15)
BUN: 13 mg/dL (ref 8–23)
CO2: 28 mmol/L (ref 22–32)
Calcium: 9.7 mg/dL (ref 8.9–10.3)
Chloride: 99 mmol/L (ref 98–111)
Creatinine, Ser: 1.24 mg/dL (ref 0.61–1.24)
GFR, Estimated: >60 mL/min (ref >60)
Glucose, Bld: 161 mg/dL — ABNORMAL HIGH (ref 70–99)
Potassium: 3.7 mmol/L (ref 3.5–5.1)
Sodium: 138 mmol/L (ref 135–145)

## 2020-09-17 LAB — BRAIN NATRIURETIC PEPTIDE: B Natriuretic Peptide: 851.4 pg/mL — ABNORMAL HIGH (ref 0.0–100.0)

## 2020-09-17 MED ORDER — FUROSEMIDE 10 MG/ML IJ SOLN
40.0000 mg | Freq: Once | INTRAMUSCULAR | Status: AC
  Administered 2020-09-17: 40 mg via INTRAVENOUS
  Filled 2020-09-17: qty 4

## 2020-09-17 NOTE — ED Provider Notes (Signed)
Emergency Medicine Provider Triage Evaluation Note  Kevin Long , a 60 y.o. male  was evaluated in triage.  Pt complains of pain and bleeding from urethra after having Foley catheter placed approximately 3 hours prior.  He reports that catheter was placed for urinary retention.  Since being placed patient has noted bleeding from his urethra as well as having pain at urethra.  Patient is unsure if he has been able to urinate since Foley catheter was placed.  Patient denies any abdominal pain, nausea, or vomiting.  Review of Systems  Positive: Penile tenderness, hematuria Negative: Abdominal pain, nausea, vomiting  Physical Exam  BP 132/83 (BP Location: Right Arm)   Pulse 66   Temp 98 F (36.7 C) (Oral)   Resp 18   Ht 5\' 6"  (1.676 m)   Wt 117.9 kg   SpO2 99%   BMI 41.97 kg/m  Gen:   Awake, no distress   Resp:  Normal effort  MSK:   Moves extremities without difficulty  Other:  Abdomen soft, nondistended, nontender.  Medical Decision Making  Medically screening exam initiated at 6:42 PM.  Appropriate orders placed.  Alpheus Stiff was informed that the remainder of the evaluation will be completed by another provider, this initial triage assessment does not replace that evaluation, and the importance of remaining in the ED until their evaluation is complete.  The patient appears stable so that the remainder of the work up may be completed by another provider.      Pleas Patricia, PA-C 09/17/20 1844    09/19/20, MD 09/18/20 2246

## 2020-09-17 NOTE — ED Triage Notes (Addendum)
Patient reports that he had a foley cath placed approx 3 hours ago for urinary retention. Patient states he has blood in the foley bag.  While patient getting bladder scan in triage, noted patient had swelling in bilateral legs, abdomen up to chest. Patient c/o SOB when lying on back during bladder scan.

## 2020-09-17 NOTE — Discharge Instructions (Signed)
Continue your home medications as previously prescribed. Follow-up with your urologist, cardiologist and primary care provider regarding today's visit. Return to the ER if you start to experience worsening shortness of breath, chest pain, fever.

## 2020-09-17 NOTE — ED Notes (Signed)
Dr. Fredderick Phenix and Hillary Bow, PA at bedside with [place] to perform [place] of heart

## 2020-09-17 NOTE — ED Notes (Signed)
Pt states foley catheter was just placed today at [facility] and he has had pain since insertion with some scant bleeding

## 2020-09-17 NOTE — ED Provider Notes (Addendum)
[facility]-EMERGENCY DEPT Provider Note   CSN: [id number] Arrival date & time: 09/17/20  1809     History Chief Complaint  Patient presents with   Hematuria    Kevin Long is a 60 y.o. male presenting to the ED for hematuria.  Had Foley catheter placed at [facility] urology office about 7 hours ago.  Was told that there would be some blood in the catheter and this was normal.  When they got home they noticed that there was more blood than what they expected.  Foley was placed and due to trouble urinating secondary to severe scrotal edema.  Wife states that he has had bilateral lower extremity edema extending up to the abdomen for the past month.  His PCP put him on Lasix and is in the process of referring to cardiology.  He denies some dyspnea as well.  Does not feel that the swelling has improved with the diuretic.  Denies any chest pain, abdominal pain, vomiting, fever.  HPI     Past Medical History:  Diagnosis Date   Asthma    Diabetes mellitus, type 2 (HCC)    Hypercholesterolemia    Hypertension    Obesity    OSA (obstructive sleep apnea)    Other primary cardiomyopathies     Patient Active Problem List   Diagnosis Date Noted   Abnormal LFTs    MSSA bacteremia    Pneumonia of right upper lobe due to methicillin susceptible Staphylococcus aureus (MSSA) (HCC)    Uncontrolled type 2 diabetes mellitus with hyperglycemia, with long-term current use of insulin (HCC) 08/12/2019   Acute metabolic encephalopathy 08/12/2019   Acute respiratory failure with hypoxemia (HCC) 08/12/2019   Lactic acidosis 08/12/2019   Acute pulmonary embolism without acute cor pulmonale (HCC) 08/12/2019   Sepsis due to COVID-19 (HCC) 08/12/2019   AKI (acute kidney injury) (HCC) 08/12/2019   Mass of upper lobe of right lung 08/12/2019   Chronic combined systolic and diastolic congestive heart failure (HCC) 09/25/2016   Vertigo 09/25/2016   PVC's (premature ventricular  contractions) 03/28/2016   Nonischemic cardiomyopathy (HCC) 08/13/2015   Palpitations 08/13/2015   Obesity, unspecified 04/21/2013   Other primary cardiomyopathies    DIABETES MELLITUS 02/09/2007   Hyperlipidemia 02/09/2007   OBSTRUCTIVE SLEEP APNEA 02/09/2007   Essential hypertension 02/09/2007   ANGINA, STABLE 02/09/2007   ASTHMA 02/09/2007    Past Surgical History:  Procedure Laterality Date   FOOT SURGERY     has pin and screws   LEFT AND RIGHT HEART CATHETERIZATION WITH CORONARY ANGIOGRAM N/A 08/04/2012   Procedure: LEFT AND RIGHT HEART CATHETERIZATION WITH CORONARY ANGIOGRAM;  Surgeon: Corky Crafts, MD;  Location: [facility] CATH LAB;  Service: Cardiovascular;  Laterality: N/A;       Family History  Problem Relation Age of Onset   Diabetes Mother    Anxiety disorder Brother    Depression Son    Heart murmur Son    Heart attack Neg Hx    Hypertension Neg Hx    Stroke Neg Hx     Social History   Tobacco Use   Smoking status: Former    Packs/day: 1.00    Years: 25.00    Pack years: 25.00    Types: Cigarettes    Quit date: 03/31/2005    Years since quitting: 15.4   Smokeless tobacco: Never  Vaping Use   Vaping Use: Never used  Substance Use Topics   Alcohol use: No   Drug use: No  Home Medications Prior to Admission medications   Medication Sig Start Date End Date Taking? Authorizing Provider  acetaminophen (TYLENOL) 500 MG tablet Take 1,000 mg by mouth every 6 (six) hours as needed for headache (pain).     [provider]  albuterol (PROVENTIL) (2.5 MG/3ML) 0.083% nebulizer solution Take 2.5 mg by nebulization every 6 (six) hours as needed for wheezing or shortness of breath.    [provider]  albuterol (VENTOLIN HFA) 108 (90 Base) MCG/ACT inhaler Inhale 2 puffs into the lungs 4 (four) times daily as needed for wheezing or shortness of breath.     [provider]  APIXABAN Everlene Balls) VTE STARTER PACK (10MG  AND 5MG ) Take as directed  on package: start with two-5mg  tablets twice daily for 7 days. On day 8 (08/23/19), switch to one-5mg  tablet twice daily. 08/19/19   Ghimire, 08/25/19, MD  Ascorbic Acid (VITAMIN C) 1000 MG tablet Take 1,000 mg by mouth daily.     [provider]  carvedilol (COREG) 6.25 MG tablet Take 1 tablet (6.25 mg total) by mouth 2 (two) times daily with a meal. 08/19/19   Ghimire, Werner Lean, MD  Cholecalciferol (VITAMIN D) 50 MCG (2000 UT) tablet Take 2,000 Units by mouth daily.    [provider]  Flaxseed, Linseed, (FLAX SEEDS PO) Take 1 capsule by mouth daily.     [provider]  furosemide (LASIX) 40 MG tablet Take 40 mg by mouth daily as needed (leg swelling).     [provider]  gabapentin (NEURONTIN) 100 MG capsule Take 1 capsule (100 mg total) by mouth 3 (three) times daily. 08/19/19   Ghimire, Werner Lean, MD  insulin aspart (NOVOLOG FLEXPEN) 100 UNIT/ML FlexPen 0-20 Units, Subcutaneous, 3 times daily with meals CBG < 70: Implement Hypoglycemia measures CBG 70 - 120: 0 units CBG 121 - 150: 3 units CBG 151 - 200: 4 units CBG 201 - 250: 7 units CBG 251 - 300: 11 units CBG 301 - 350: 15 units CBG 351 - 400: 20 units CBG > 400: call MD 08/19/19   Werner Lean, MD  insulin glargine (LANTUS SOLOSTAR) 100 UNIT/ML Solostar Pen Inject 28 Units into the skin at bedtime. 08/19/19   Ghimire, Maretta Bees, MD  isosorbide-hydrALAZINE (BIDIL) 20-37.5 MG tablet Take 1 tablet by mouth 3 (three) times daily. 06/22/19   Werner Lean, MD  Zinc 50 MG TABS Take 50 mg by mouth daily.     [provider]    Allergies    Celebrex [celecoxib] and Entresto [sacubitril-valsartan]  Review of Systems   Review of Systems  Constitutional:  Negative for appetite change, chills and fever.  HENT:  Negative for ear pain, rhinorrhea, sneezing and sore throat.   Eyes:  Negative for photophobia and visual disturbance.  Respiratory:  Positive for shortness of breath. Negative for cough, chest  tightness and wheezing.   Cardiovascular:  Positive for leg swelling. Negative for chest pain and palpitations.  Gastrointestinal:  Negative for abdominal pain, blood in stool, constipation, diarrhea, nausea and vomiting.  Genitourinary:  Positive for hematuria. Negative for dysuria and urgency.  Musculoskeletal:  Negative for myalgias.  Skin:  Negative for rash.  Neurological:  Negative for dizziness, weakness and light-headedness.   Physical Exam Updated Vital Signs BP 128/82   Pulse 70   Temp 98 F (36.7 C) (Oral)   Resp 15   Ht 5\' 6"  (1.676 m)   Wt 117.9 kg   SpO2 97%   BMI  41.97 kg/m   Physical Exam Vitals and nursing note reviewed.  Constitutional:      General: He is not in acute distress.    Appearance: He is well-developed.  HENT:     Head: Normocephalic and atraumatic.     Nose: Nose normal.  Eyes:     General: No scleral icterus.       Left eye: No discharge.     Conjunctiva/sclera: Conjunctivae normal.  Cardiovascular:     Rate and Rhythm: Normal rate and regular rhythm.     Heart sounds: Normal heart sounds. No murmur heard.   No friction rub. No gallop.  Pulmonary:     Effort: Pulmonary effort is normal. No respiratory distress.     Breath sounds: Normal breath sounds.  Abdominal:     General: Bowel sounds are normal. There is no distension.     Palpations: Abdomen is soft.     Tenderness: There is no abdominal tenderness. There is no guarding.  Genitourinary:    Comments: Foley catheter in place with scrotal edema noted. Some blood at urethra. Foley bag with blood tinged urine noted. Musculoskeletal:        General: Normal range of motion.     Cervical back: Normal range of motion and neck supple.     Right lower leg: Edema present.     Left lower leg: Edema present.     Comments: Edema noted up to abdomen.  Skin:    General: Skin is warm and dry.     Findings: No rash.  Neurological:     Mental Status: He is alert.     Motor: No abnormal muscle  tone.     Coordination: Coordination normal.    ED Results / Procedures / Treatments   Labs (all labs ordered are listed, but only abnormal results are displayed) Labs Reviewed  BASIC METABOLIC PANEL - Abnormal; Notable for the following components:      Result Value   Glucose, Bld 161 (*)    All other components within normal limits  CBC WITH DIFFERENTIAL/PLATELET - Abnormal; Notable for the following components:   RBC 3.84 (*)    Hemoglobin 11.2 (*)    HCT 34.3 (*)    RDW 17.1 (*)    All other components within normal limits  BRAIN NATRIURETIC PEPTIDE - Abnormal; Notable for the following components:   B Natriuretic Peptide 851.4 (*)    All other components within normal limits  URINALYSIS, ROUTINE W REFLEX MICROSCOPIC - Abnormal; Notable for the following components:   Color, Urine AMBER (*)    APPearance CLOUDY (*)    Glucose, UA 50 (*)    Hgb urine dipstick LARGE (*)    Ketones, ur 5 (*)    Protein, ur 100 (*)    RBC / HPF >50 (*)    All other components within normal limits  URINE CULTURE    EKG None  Radiology DG Chest 2 View  Result Date: 09/17/2020 CLINICAL DATA:  Shortness of breath EXAM: CHEST - 2 VIEW COMPARISON:  09/08/2019 FINDINGS: Cardiomegaly. No focal opacity, pleural effusion or pneumothorax. No overt pulmonary edema. Minimal scarring in the right upper lobe. IMPRESSION: Cardiomegaly, appears increased compared to prior. Somewhat globular cardiac configuration, question pericardial effusion. Electronically Signed   By: Jasmine Pang M.D.   On: 09/17/2020 21:31    Procedures Procedures   Medications Ordered in ED Medications - No data to display  ED Course  I have reviewed the triage vital  signs and the nursing notes.  Pertinent labs & imaging results that were available during my care of the patient were reviewed by me and considered in my medical decision making (see chart for details).  Clinical Course as of 09/17/20 2338  Mon Sep 17, 2020   2039 Hgb urine dipstick(!): LARGE [HK]  2239 Creatinine: 1.24 [HK]  2239 B Natriuretic Peptide(!): 851.4 [HK]  2239 Hemoglobin(!): 11.2 [HK]    Clinical Course User Index [HK] Dietrich Pates, PA-C   MDM Rules/Calculators/A&P                          60 year old male presenting to the ED for hematuria.  He has had edema extending up to his abdomen from his bilateral lower extremities for the past month.  He has been on a diuretic but does not feel that this is helping.  He was seen and evaluated at urology office today and Foley catheter was placed due to persistent scrotal edema.  States that he is having pain with certain movement and is concerned due to blood in his urine which he is unsure if is more than what is expected.  He does report some dyspnea along with his edema.  On exam abdomen is soft.  He has blood-tinged urine noted in his Foley bag that is draining.  Bladder scan revealed 100 cc of urine.  Creatinine of 1.24 here.  BNP slightly elevated to 800.  Chest x-ray shows cardiomegaly which is increased compared to prior. Question pericardial effusion.  Bedside ultrasound done by Dr. Fredderick Phenix without any evidence of pericardial effusion.  Foley was flushed with no clots or resistance noted. Will have him continue diuretic and follow-up with cardiology as well as urology regarding today's visit. Given IV lasix here. Return precautions given.   Patient is hemodynamically stable, in NAD. Evaluation does not show pathology that would require ongoing emergent intervention or inpatient treatment. I explained the diagnosis to the patient. Pain has been managed and has no complaints prior to discharge. Patient is comfortable with above plan and is stable for discharge at this time. All questions were answered prior to disposition. Strict return precautions for returning to the ED were discussed. Encouraged follow up with PCP.   An After Visit Summary was printed and given to the  patient.   Portions of this note were generated with Scientist, clinical (histocompatibility and immunogenetics). Dictation errors may occur despite best attempts at proofreading.  Final Clinical Impression(s) / ED Diagnoses Final diagnoses:  Peripheral edema  Foley catheter in place    Rx / DC Orders ED Discharge Orders     None         Dietrich Pates, PA-C 09/17/20 2338    Rolan Bucco, MD 09/18/20 2348

## 2020-09-17 NOTE — ED Notes (Signed)
Catheter irrigated with 71mL of NS. Fluid is draining appropriately. No clots noted. Pt leg bag changed to urinary drainage bag and stat lock applied

## 2020-09-19 ENCOUNTER — Other Ambulatory Visit: Payer: Self-pay

## 2020-09-19 DIAGNOSIS — M25559 Pain in unspecified hip: Secondary | ICD-10-CM

## 2020-09-19 DIAGNOSIS — R7881 Bacteremia: Secondary | ICD-10-CM

## 2020-09-19 DIAGNOSIS — M25569 Pain in unspecified knee: Secondary | ICD-10-CM

## 2020-09-19 DIAGNOSIS — M545 Low back pain, unspecified: Secondary | ICD-10-CM | POA: Insufficient documentation

## 2020-09-19 DIAGNOSIS — M51379 Other intervertebral disc degeneration, lumbosacral region without mention of lumbar back pain or lower extremity pain: Secondary | ICD-10-CM

## 2020-09-19 DIAGNOSIS — M109 Gout, unspecified: Secondary | ICD-10-CM

## 2020-09-19 DIAGNOSIS — F17200 Nicotine dependence, unspecified, uncomplicated: Secondary | ICD-10-CM

## 2020-09-19 DIAGNOSIS — M5137 Other intervertebral disc degeneration, lumbosacral region: Secondary | ICD-10-CM

## 2020-09-19 HISTORY — DX: Other intervertebral disc degeneration, lumbosacral region: M51.37

## 2020-09-19 HISTORY — DX: Pain in unspecified hip: M25.559

## 2020-09-19 HISTORY — DX: Nicotine dependence, unspecified, uncomplicated: F17.200

## 2020-09-19 HISTORY — DX: Gout, unspecified: M10.9

## 2020-09-19 HISTORY — DX: Other intervertebral disc degeneration, lumbosacral region without mention of lumbar back pain or lower extremity pain: M51.379

## 2020-09-19 HISTORY — DX: Pain in unspecified knee: M25.569

## 2020-09-19 HISTORY — DX: Bacteremia: R78.81

## 2020-09-19 LAB — URINE CULTURE: Culture: <10000 — AB

## 2020-09-21 ENCOUNTER — Encounter (HOSPITAL_BASED_OUTPATIENT_CLINIC_OR_DEPARTMENT_OTHER): Payer: Self-pay | Admitting: *Deleted

## 2020-09-21 ENCOUNTER — Other Ambulatory Visit: Payer: Self-pay

## 2020-09-21 ENCOUNTER — Inpatient Hospital Stay (HOSPITAL_BASED_OUTPATIENT_CLINIC_OR_DEPARTMENT_OTHER)
Admission: EM | Admit: 2020-09-21 | Discharge: 2020-09-28 | DRG: 291 | Disposition: A | Discharge status: Home Health | Payer: Medicaid Other | Attending: Internal Medicine | Admitting: Internal Medicine

## 2020-09-21 ENCOUNTER — Emergency Department (HOSPITAL_BASED_OUTPATIENT_CLINIC_OR_DEPARTMENT_OTHER): Payer: Medicaid Other

## 2020-09-21 ENCOUNTER — Ambulatory Visit (INDEPENDENT_AMBULATORY_CARE_PROVIDER_SITE_OTHER): Payer: Medicaid Other | Admitting: Cardiology

## 2020-09-21 VITALS — BP 138/72 | HR 81 | Ht 66.0 in | Wt 260.0 lb

## 2020-09-21 DIAGNOSIS — R7881 Bacteremia: Secondary | ICD-10-CM | POA: Diagnosis present

## 2020-09-21 DIAGNOSIS — Z86711 Personal history of pulmonary embolism: Secondary | ICD-10-CM | POA: Diagnosis not present

## 2020-09-21 DIAGNOSIS — Z9114 Patient's other noncompliance with medication regimen: Secondary | ICD-10-CM | POA: Diagnosis not present

## 2020-09-21 DIAGNOSIS — J9600 Acute respiratory failure, unspecified whether with hypoxia or hypercapnia: Secondary | ICD-10-CM | POA: Diagnosis present

## 2020-09-21 DIAGNOSIS — G4733 Obstructive sleep apnea (adult) (pediatric): Secondary | ICD-10-CM | POA: Diagnosis present

## 2020-09-21 DIAGNOSIS — E6609 Other obesity due to excess calories: Secondary | ICD-10-CM | POA: Diagnosis not present

## 2020-09-21 DIAGNOSIS — D509 Iron deficiency anemia, unspecified: Secondary | ICD-10-CM | POA: Diagnosis present

## 2020-09-21 DIAGNOSIS — I10 Essential (primary) hypertension: Secondary | ICD-10-CM | POA: Diagnosis present

## 2020-09-21 DIAGNOSIS — R601 Generalized edema: Secondary | ICD-10-CM

## 2020-09-21 DIAGNOSIS — D638 Anemia in other chronic diseases classified elsewhere: Secondary | ICD-10-CM | POA: Diagnosis present

## 2020-09-21 DIAGNOSIS — I420 Dilated cardiomyopathy: Secondary | ICD-10-CM

## 2020-09-21 DIAGNOSIS — Z794 Long term (current) use of insulin: Secondary | ICD-10-CM | POA: Diagnosis not present

## 2020-09-21 DIAGNOSIS — M109 Gout, unspecified: Secondary | ICD-10-CM | POA: Diagnosis present

## 2020-09-21 DIAGNOSIS — N5089 Other specified disorders of the male genital organs: Secondary | ICD-10-CM | POA: Diagnosis present

## 2020-09-21 DIAGNOSIS — Z9119 Patient's noncompliance with other medical treatment and regimen: Secondary | ICD-10-CM

## 2020-09-21 DIAGNOSIS — I471 Supraventricular tachycardia: Secondary | ICD-10-CM | POA: Diagnosis not present

## 2020-09-21 DIAGNOSIS — I5043 Acute on chronic combined systolic (congestive) and diastolic (congestive) heart failure: Secondary | ICD-10-CM | POA: Diagnosis present

## 2020-09-21 DIAGNOSIS — Z79899 Other long term (current) drug therapy: Secondary | ICD-10-CM

## 2020-09-21 DIAGNOSIS — R945 Abnormal results of liver function studies: Secondary | ICD-10-CM

## 2020-09-21 DIAGNOSIS — R7989 Other specified abnormal findings of blood chemistry: Secondary | ICD-10-CM

## 2020-09-21 DIAGNOSIS — I472 Ventricular tachycardia: Secondary | ICD-10-CM | POA: Diagnosis not present

## 2020-09-21 DIAGNOSIS — Z6841 Body Mass Index (BMI) 40.0 and over, adult: Secondary | ICD-10-CM

## 2020-09-21 DIAGNOSIS — U071 COVID-19: Secondary | ICD-10-CM | POA: Diagnosis present

## 2020-09-21 DIAGNOSIS — E876 Hypokalemia: Secondary | ICD-10-CM | POA: Diagnosis present

## 2020-09-21 DIAGNOSIS — E785 Hyperlipidemia, unspecified: Secondary | ICD-10-CM | POA: Diagnosis present

## 2020-09-21 DIAGNOSIS — I5082 Biventricular heart failure: Secondary | ICD-10-CM | POA: Diagnosis present

## 2020-09-21 DIAGNOSIS — Z833 Family history of diabetes mellitus: Secondary | ICD-10-CM

## 2020-09-21 DIAGNOSIS — I428 Other cardiomyopathies: Secondary | ICD-10-CM | POA: Diagnosis present

## 2020-09-21 DIAGNOSIS — I5023 Acute on chronic systolic (congestive) heart failure: Secondary | ICD-10-CM | POA: Diagnosis not present

## 2020-09-21 DIAGNOSIS — E1165 Type 2 diabetes mellitus with hyperglycemia: Secondary | ICD-10-CM | POA: Diagnosis present

## 2020-09-21 DIAGNOSIS — T502X5A Adverse effect of carbonic-anhydrase inhibitors, benzothiadiazides and other diuretics, initial encounter: Secondary | ICD-10-CM | POA: Diagnosis present

## 2020-09-21 DIAGNOSIS — J45909 Unspecified asthma, uncomplicated: Secondary | ICD-10-CM | POA: Diagnosis present

## 2020-09-21 DIAGNOSIS — I110 Hypertensive heart disease with heart failure: Principal | ICD-10-CM | POA: Diagnosis present

## 2020-09-21 DIAGNOSIS — Z8616 Personal history of COVID-19: Secondary | ICD-10-CM

## 2020-09-21 DIAGNOSIS — Z87891 Personal history of nicotine dependence: Secondary | ICD-10-CM

## 2020-09-21 DIAGNOSIS — I509 Heart failure, unspecified: Secondary | ICD-10-CM

## 2020-09-21 DIAGNOSIS — K5900 Constipation, unspecified: Secondary | ICD-10-CM | POA: Diagnosis present

## 2020-09-21 DIAGNOSIS — E7800 Pure hypercholesterolemia, unspecified: Secondary | ICD-10-CM | POA: Diagnosis present

## 2020-09-21 DIAGNOSIS — I2699 Other pulmonary embolism without acute cor pulmonale: Secondary | ICD-10-CM | POA: Diagnosis present

## 2020-09-21 DIAGNOSIS — I5021 Acute systolic (congestive) heart failure: Secondary | ICD-10-CM | POA: Diagnosis not present

## 2020-09-21 DIAGNOSIS — R5381 Other malaise: Secondary | ICD-10-CM | POA: Diagnosis present

## 2020-09-21 DIAGNOSIS — R040 Epistaxis: Secondary | ICD-10-CM | POA: Diagnosis not present

## 2020-09-21 HISTORY — DX: Personal history of COVID-19: Z86.16

## 2020-09-21 LAB — PROTIME-INR
INR: 1.5 — ABNORMAL HIGH (ref 0.8–1.2)
Prothrombin Time: 18.5 seconds — ABNORMAL HIGH (ref 11.4–15.2)

## 2020-09-21 LAB — COMPREHENSIVE METABOLIC PANEL
ALT: 13 U/L (ref 0–44)
AST: 24 U/L (ref 15–41)
Albumin: 4 g/dL (ref 3.5–5.0)
Alkaline Phosphatase: 121 U/L (ref 38–126)
Anion gap: 10 (ref 5–15)
BUN: 11 mg/dL (ref 8–23)
CO2: 27 mmol/L (ref 22–32)
Calcium: 9.2 mg/dL (ref 8.9–10.3)
Chloride: 100 mmol/L (ref 98–111)
Creatinine, Ser: 1.04 mg/dL (ref 0.61–1.24)
GFR, Estimated: >60 mL/min (ref >60)
Glucose, Bld: 127 mg/dL — ABNORMAL HIGH (ref 70–99)
Potassium: 3.6 mmol/L (ref 3.5–5.1)
Sodium: 137 mmol/L (ref 135–145)
Total Bilirubin: 3.4 mg/dL — ABNORMAL HIGH (ref 0.3–1.2)
Total Protein: 7.6 g/dL (ref 6.5–8.1)

## 2020-09-21 LAB — RESP PANEL BY RT-PCR (FLU A&B, COVID) ARPGX2
Influenza A by PCR: NEGATIVE
Influenza B by PCR: NEGATIVE
SARS Coronavirus 2 by RT PCR: NEGATIVE

## 2020-09-21 LAB — CBC WITH DIFFERENTIAL/PLATELET
Abs Immature Granulocytes: 0.02 10*3/uL (ref 0.00–0.07)
Basophils Absolute: 0.1 10*3/uL (ref 0.0–0.1)
Basophils Relative: 1 %
Eosinophils Absolute: 0.3 10*3/uL (ref 0.0–0.5)
Eosinophils Relative: 4 %
HCT: 34.5 % — ABNORMAL LOW (ref 39.0–52.0)
Hemoglobin: 11.4 g/dL — ABNORMAL LOW (ref 13.0–17.0)
Immature Granulocytes: 0 %
Lymphocytes Relative: 13 %
Lymphs Abs: 1 10*3/uL (ref 0.7–4.0)
MCH: 29.4 pg (ref 26.0–34.0)
MCHC: 33 g/dL (ref 30.0–36.0)
MCV: 88.9 fL (ref 80.0–100.0)
Monocytes Absolute: 0.9 10*3/uL (ref 0.1–1.0)
Monocytes Relative: 11 %
Neutro Abs: 5.8 10*3/uL (ref 1.7–7.7)
Neutrophils Relative %: 71 %
Platelets: 331 10*3/uL (ref 150–400)
RBC: 3.88 MIL/uL — ABNORMAL LOW (ref 4.22–5.81)
RDW: 17.2 % — ABNORMAL HIGH (ref 11.5–15.5)
WBC: 8.1 10*3/uL (ref 4.0–10.5)
nRBC: 0 % (ref 0.0–0.2)

## 2020-09-21 LAB — BRAIN NATRIURETIC PEPTIDE: B Natriuretic Peptide: 1031.1 pg/mL — ABNORMAL HIGH (ref 0.0–100.0)

## 2020-09-21 LAB — GLUCOSE, CAPILLARY
Glucose-Capillary: 135 mg/dL — ABNORMAL HIGH (ref 70–99)
Glucose-Capillary: 156 mg/dL — ABNORMAL HIGH (ref 70–99)

## 2020-09-21 LAB — LIPASE, BLOOD: Lipase: 53 U/L — ABNORMAL HIGH (ref 11–51)

## 2020-09-21 LAB — HEMOGLOBIN A1C
Hgb A1c MFr Bld: 7.1 % — ABNORMAL HIGH (ref 4.8–5.6)
Mean Plasma Glucose: 157.07 mg/dL

## 2020-09-21 LAB — HIV ANTIBODY (ROUTINE TESTING W REFLEX): HIV Screen 4th Generation wRfx: NONREACTIVE

## 2020-09-21 MED ORDER — SODIUM CHLORIDE 0.9% FLUSH
3.0000 mL | Freq: Two times a day (BID) | INTRAVENOUS | Status: DC
  Administered 2020-09-21 – 2020-09-28 (×14): 3 mL via INTRAVENOUS

## 2020-09-21 MED ORDER — SODIUM CHLORIDE 0.9% FLUSH
3.0000 mL | INTRAVENOUS | Status: DC | PRN
Start: 2020-09-21 — End: 2020-09-28

## 2020-09-21 MED ORDER — INSULIN ASPART 100 UNIT/ML IJ SOLN
0.0000 [IU] | Freq: Three times a day (TID) | INTRAMUSCULAR | Status: DC
  Administered 2020-09-22: 4 [IU] via SUBCUTANEOUS
  Administered 2020-09-22: 3 [IU] via SUBCUTANEOUS
  Administered 2020-09-23 (×2): 4 [IU] via SUBCUTANEOUS
  Administered 2020-09-24: 3 [IU] via SUBCUTANEOUS
  Administered 2020-09-24 – 2020-09-25 (×2): 4 [IU] via SUBCUTANEOUS
  Administered 2020-09-26 (×2): 3 [IU] via SUBCUTANEOUS
  Administered 2020-09-27 (×2): 4 [IU] via SUBCUTANEOUS
  Administered 2020-09-28: 3 [IU] via SUBCUTANEOUS

## 2020-09-21 MED ORDER — ACETAMINOPHEN 325 MG PO TABS
650.0000 mg | ORAL_TABLET | ORAL | Status: DC | PRN
  Administered 2020-09-22 – 2020-09-24 (×2): 650 mg via ORAL
  Filled 2020-09-21 (×2): qty 2

## 2020-09-21 MED ORDER — ALBUTEROL SULFATE HFA 108 (90 BASE) MCG/ACT IN AERS
2.0000 | INHALATION_SPRAY | Freq: Four times a day (QID) | RESPIRATORY_TRACT | Status: DC | PRN
  Filled 2020-09-21: qty 6.7

## 2020-09-21 MED ORDER — GABAPENTIN 100 MG PO CAPS: 100.0000 mg | ORAL_CAPSULE | Freq: Every evening | ORAL | Status: DC | PRN

## 2020-09-21 MED ORDER — FUROSEMIDE 10 MG/ML IJ SOLN
40.0000 mg | Freq: Every day | INTRAMUSCULAR | Status: DC
  Administered 2020-09-22: 40 mg via INTRAVENOUS
  Filled 2020-09-21: qty 4

## 2020-09-21 MED ORDER — CARVEDILOL 12.5 MG PO TABS
12.5000 mg | ORAL_TABLET | Freq: Two times a day (BID) | ORAL | Status: DC
  Administered 2020-09-22 – 2020-09-23 (×3): 12.5 mg via ORAL
  Filled 2020-09-21 (×3): qty 1

## 2020-09-21 MED ORDER — FUROSEMIDE 10 MG/ML IJ SOLN
40.0000 mg | Freq: Once | INTRAMUSCULAR | Status: AC
  Administered 2020-09-21: 40 mg via INTRAVENOUS
  Filled 2020-09-21: qty 4

## 2020-09-21 MED ORDER — VITAMIN D (ERGOCALCIFEROL) 1.25 MG (50000 UNIT) PO CAPS
50000.0000 [IU] | ORAL_CAPSULE | ORAL | Status: DC
  Administered 2020-09-24: 50000 [IU] via ORAL
  Filled 2020-09-21: qty 1

## 2020-09-21 MED ORDER — SODIUM CHLORIDE 0.9 % IV SOLN
250.0000 mL | INTRAVENOUS | Status: DC | PRN
Start: 2020-09-21 — End: 2020-09-28

## 2020-09-21 MED ORDER — CEPHALEXIN 500 MG PO CAPS: 500.0000 mg | ORAL_CAPSULE | Freq: Two times a day (BID) | ORAL | Status: DC

## 2020-09-21 MED ORDER — ENOXAPARIN SODIUM 60 MG/0.6ML IJ SOSY
60.0000 mg | PREFILLED_SYRINGE | INTRAMUSCULAR | Status: DC
  Administered 2020-09-21 – 2020-09-26 (×6): 60 mg via SUBCUTANEOUS
  Filled 2020-09-21 (×6): qty 0.6

## 2020-09-21 MED ORDER — ASPIRIN EC 81 MG PO TBEC
81.0000 mg | DELAYED_RELEASE_TABLET | Freq: Every day | ORAL | Status: DC
  Administered 2020-09-21 – 2020-09-28 (×8): 81 mg via ORAL
  Filled 2020-09-21 (×8): qty 1

## 2020-09-21 MED ORDER — LINACLOTIDE 145 MCG PO CAPS
145.0000 ug | ORAL_CAPSULE | Freq: Every day | ORAL | Status: DC | PRN
  Filled 2020-09-21: qty 1

## 2020-09-21 NOTE — ED Triage Notes (Signed)
He was seen by his cardiologist this am for SOB and swelling. He was sent to the ER to r/o CHF.

## 2020-09-21 NOTE — ED Notes (Signed)
Assessed PT with complaint of SOB (R/O CHF) on RA- Sp02 96%, RR 23, BBS mild, fine crackles in both upper lobes and diminished in bilateral lobes. PT does appear to be slightly short of breath but able to speak in complete sentences.

## 2020-09-21 NOTE — ED Provider Notes (Signed)
[facility] EMERGENCY DEPARTMENT Provider Note   CSN: [id number] Arrival date & time: 09/21/20  1051     History Chief Complaint  Patient presents with   Shortness of Breath    Kevin Long is a 60 y.o. male.  60 yo M with a cc of shortness of breath and edema.  This been going on for a few weeks now.  Been seen in the ED a few days ago for similar complaint.  His cardiologist saw him in the office and felt that due to the extent of his fluid overload that he needed to be admitted to the hospital.  I did talk to his cardiologist about the patient prior to the patient arriving here.  Denies any chest pain or pressure.  Denies cough congestion or fever.  He has not been taking any Lasix because he said no one told him to.  The history is provided by the patient.  Shortness of Breath Severity:  Moderate Onset quality:  Sudden Duration:  2 weeks Timing:  Constant Progression:  Worsening Chronicity:  New Relieved by:  Nothing Worsened by:  Exertion Ineffective treatments:  None tried Associated symptoms: no abdominal pain, no chest pain, no fever, no headaches, no rash and no vomiting       Past Medical History:  Diagnosis Date   Abnormal LFTs    Acute metabolic encephalopathy 08/12/2019   Acute pulmonary embolism without acute cor pulmonale (HCC) 08/12/2019   Acute respiratory failure with hypoxemia (HCC) 08/12/2019   AKI (acute kidney injury) (HCC) 08/12/2019   ANGINA, STABLE 02/09/2007   Qualifier: History of  By: Yetta Barre CNA/MA, Jessica     Asthma    ASTHMA 02/09/2007   Qualifier: Diagnosis of  By: Yetta Barre CNA/MA, Jessica     Bacteremia 09/19/2020   Cardiomyopathy (HCC) 08/13/2015   Chronic combined systolic and diastolic congestive heart failure (HCC) 09/25/2016   Degeneration of lumbosacral intervertebral disc 09/19/2020   Diabetes mellitus, type 2 (HCC)    Essential hypertension 02/09/2007   Qualifier: Diagnosis of  By: Yetta Barre CNA/MA, Jessica     Gout 09/19/2020    Hypercholesterolemia    Hypertension    Lactic acidosis 08/12/2019   Mass of upper lobe of right lung 08/12/2019   MSSA bacteremia    Obesity    OBSTRUCTIVE SLEEP APNEA 02/09/2007   Qualifier: Diagnosis of  By: Yetta Barre CNA/MA, Jessica     OSA (obstructive sleep apnea)    Other and unspecified hyperlipidemia 02/09/2007   Qualifier: Diagnosis of  By: Yetta Barre CNA/MA, Shanda Bumps     Other primary cardiomyopathies    Pain in joint involving pelvic region and thigh 09/19/2020   Pain in joint, lower leg 09/19/2020   Palpitations 08/13/2015   Pneumonia of right upper lobe due to methicillin susceptible Staphylococcus aureus (MSSA) (HCC)    PVC's (premature ventricular contractions) 03/28/2016   Sepsis due to COVID-19 (HCC) 08/12/2019   Tobacco use disorder 09/19/2020   Type 2 or unspecified type diabetes mellitus 02/09/2007   Qualifier: Diagnosis of  By: Yetta Barre CNA/MA, Jessica     Uncontrolled type 2 diabetes mellitus with hyperglycemia, with long-term current use of insulin (HCC) 08/12/2019   Vertigo 09/25/2016    Patient Active Problem List   Diagnosis Date Noted   Pain in joint involving pelvic region and thigh 09/19/2020   Low back pain 09/19/2020   Gout 09/19/2020   Degeneration of lumbosacral intervertebral disc 09/19/2020   Bacteremia 09/19/2020   Tobacco use disorder 09/19/2020  Pain in joint, lower leg 09/19/2020   Abnormal LFTs    MSSA bacteremia    Pneumonia of right upper lobe due to methicillin susceptible Staphylococcus aureus (MSSA) (HCC)    Uncontrolled type 2 diabetes mellitus with hyperglycemia, with long-term current use of insulin (HCC) 08/12/2019   Acute metabolic encephalopathy 08/12/2019   Acute respiratory failure with hypoxemia (HCC) 08/12/2019   Lactic acidosis 08/12/2019   Sepsis due to COVID-19 (HCC) 08/12/2019   AKI (acute kidney injury) (HCC) 08/12/2019   Mass of upper lobe of right lung 08/12/2019   Acute on chronic combined systolic and diastolic CHF (congestive  heart failure) (HCC) 09/25/2016   Vertigo 09/25/2016   PVC's (premature ventricular contractions) 03/28/2016   Cardiomyopathy (HCC) 08/13/2015   Palpitations 08/13/2015   Obesity 04/21/2013   Other primary cardiomyopathies    Type 2 or unspecified type diabetes mellitus 02/09/2007   Other and unspecified hyperlipidemia 02/09/2007   OBSTRUCTIVE SLEEP APNEA 02/09/2007   Essential hypertension 02/09/2007   ANGINA, STABLE 02/09/2007   ASTHMA 02/09/2007    Past Surgical History:  Procedure Laterality Date   FOOT SURGERY     has pin and screws   LEFT AND RIGHT HEART CATHETERIZATION WITH CORONARY ANGIOGRAM N/A 08/04/2012   Procedure: LEFT AND RIGHT HEART CATHETERIZATION WITH CORONARY ANGIOGRAM;  Surgeon: Corky Crafts, MD;  Location: [facility] CATH LAB;  Service: Cardiovascular;  Laterality: N/A;       Family History  Problem Relation Age of Onset   Diabetes Mother    Anxiety disorder Brother    Depression Son    Heart murmur Son    Heart attack Neg Hx    Hypertension Neg Hx    Stroke Neg Hx     Social History   Tobacco Use   Smoking status: Former    Packs/day: 1.00    Years: 25.00    Pack years: 25.00    Types: Cigarettes    Quit date: 03/31/2005    Years since quitting: 15.4   Smokeless tobacco: Never  Vaping Use   Vaping Use: Never used  Substance Use Topics   Alcohol use: No   Drug use: No    Home Medications Prior to Admission medications   Medication Sig Start Date End Date Taking? Authorizing Provider  acetaminophen (TYLENOL) 500 MG tablet Take 1,000 mg by mouth every 6 (six) hours as needed for headache (pain).     [provider]  albuterol (PROVENTIL) (2.5 MG/3ML) 0.083% nebulizer solution Take 2.5 mg by nebulization every 6 (six) hours as needed for wheezing or shortness of breath.    [provider]  albuterol (VENTOLIN HFA) 108 (90 Base) MCG/ACT inhaler Inhale 2 puffs into the lungs 4 (four) times daily as needed for wheezing or shortness  of breath.     [provider]  Ascorbic Acid (VITAMIN C) 1000 MG tablet Take 1,000 mg by mouth daily.     [provider]  carvedilol (COREG) 6.25 MG tablet Take 1 tablet (6.25 mg total) by mouth 2 (two) times daily with a meal. 08/19/19   Ghimire, Werner Lean, MD  cephALEXin (KEFLEX) 500 MG capsule Take 500 mg by mouth 2 (two) times daily.    [provider]  Cholecalciferol (VITAMIN D) 50 MCG (2000 UT) tablet Take 2,000 Units by mouth daily.    [provider]  diclofenac Sodium (VOLTAREN) 1 % GEL Apply 1 application topically daily. 08/17/20   [provider]  furosemide (LASIX) 40 MG tablet Take  40 mg by mouth daily as needed for fluid or edema (leg swelling).    [provider]  gabapentin (NEURONTIN) 100 MG capsule Take 1 capsule (100 mg total) by mouth 3 (three) times daily. 08/19/19   Ghimire, Werner Lean, MD  insulin aspart (NOVOLOG FLEXPEN) 100 UNIT/ML FlexPen 0-20 Units, Subcutaneous, 3 times daily with meals CBG < 70: Implement Hypoglycemia measures CBG 70 - 120: 0 units CBG 121 - 150: 3 units CBG 151 - 200: 4 units CBG 201 - 250: 7 units CBG 251 - 300: 11 units CBG 301 - 350: 15 units CBG 351 - 400: 20 units CBG > 400: call MD Patient taking differently: 20 Units as directed. 0-20 Units, Subcutaneous, 3 times daily with meals CBG < 70: Implement Hypoglycemia measures CBG 70 - 120: 0 units CBG 121 - 150: 3 units CBG 151 - 200: 4 units CBG 201 - 250: 7 units CBG 251 - 300: 11 units CBG 301 - 350: 15 units CBG 351 - 400: 20 units CBG > 400: call MD 08/19/19   Maretta Bees, MD  insulin glargine (LANTUS SOLOSTAR) 100 UNIT/ML Solostar Pen Inject 28 Units into the skin at bedtime. 08/19/19   Ghimire, Werner Lean, MD  isosorbide-hydrALAZINE (BIDIL) 20-37.5 MG tablet Take 1 tablet by mouth 3 (three) times daily. 06/22/19   Yates Decamp, MD  LINZESS 145 MCG CAPS capsule Take 145 mcg by mouth daily. 09/11/20   [provider]  Vitamin D,  Ergocalciferol, (DRISDOL) 1.25 MG (50000 UNIT) CAPS capsule Take 1 capsule by mouth once a week. 09/15/20   [provider]  Zinc 50 MG TABS Take 50 mg by mouth daily.     [provider]    Allergies    Celebrex [celecoxib], Entresto [sacubitril-valsartan], and Rosuvastatin  Review of Systems   Review of Systems  Constitutional:  Negative for chills and fever.  HENT:  Negative for congestion and facial swelling.   Eyes:  Negative for discharge and visual disturbance.  Respiratory:  Positive for shortness of breath.   Cardiovascular:  Positive for leg swelling. Negative for chest pain and palpitations.  Gastrointestinal:  Positive for abdominal distention. Negative for abdominal pain, diarrhea and vomiting.  Musculoskeletal:  Negative for arthralgias and myalgias.  Skin:  Negative for color change and rash.  Neurological:  Negative for tremors, syncope and headaches.  Psychiatric/Behavioral:  Negative for confusion and dysphoric mood.    Physical Exam Updated Vital Signs BP 129/77 (BP Location: Left Arm)   Pulse 67   Temp 98.9 F (37.2 C) (Oral)   Resp 18   Ht 5\' 6"  (1.676 m)   Wt 117.9 kg   SpO2 98%   BMI 41.95 kg/m   Physical Exam Vitals and nursing note reviewed.  Constitutional:      Appearance: He is well-developed.  HENT:     Head: Normocephalic and atraumatic.  Eyes:     Pupils: Pupils are equal, round, and reactive to light.  Neck:     Vascular: No JVD.  Cardiovascular:     Rate and Rhythm: Normal rate and regular rhythm.     Heart sounds: No murmur heard.   No friction rub. No gallop.  Pulmonary:     Effort: No respiratory distress.     Breath sounds: No wheezing.  Abdominal:     General: There is no distension.     Tenderness: There is no abdominal tenderness. There is no guarding or rebound.     Comments:  Abdominal wall edema  Musculoskeletal:        General: Normal range of motion.     Cervical back: Normal range of motion and neck  supple.     Right lower leg: Edema present.     Left lower leg: Edema present.  Skin:    Coloration: Skin is not pale.     Findings: No rash.  Neurological:     Mental Status: He is alert and oriented to person, place, and time.  Psychiatric:        Behavior: Behavior normal.    ED Results / Procedures / Treatments   Labs (all labs ordered are listed, but only abnormal results are displayed) Labs Reviewed  RESP PANEL BY RT-PCR (FLU A&B, COVID) ARPGX2  CBC WITH DIFFERENTIAL/PLATELET  COMPREHENSIVE METABOLIC PANEL  LIPASE, BLOOD  BRAIN NATRIURETIC PEPTIDE  PROTIME-INR    EKG None  Radiology No results found.  Procedures Procedures   Medications Ordered in ED Medications - No data to display  ED Course  I have reviewed the triage vital signs and the nursing notes.  Pertinent labs & imaging results that were available during my care of the patient were reviewed by me and considered in my medical decision making (see chart for details).    MDM Rules/Calculators/A&P                          60 yo M with a chief complaints of anasarca.  Going on for weeks now was seen in the cardiologist office today and told to come to the ED to be admitted.  We will obtain blood work COVID test chest x-ray.  Patient with no significant renal dysfunction, LFTs are unremarkable INR is very mildly elevated.  BNP is elevated.  Chest x-ray viewed by me without focal infiltrate or fluid overload.  We will give a bolus of Lasix.  Discussed with hospitalist for admission.  The patients results and plan were reviewed and discussed.   Any x-rays performed were independently reviewed by myself.   Differential diagnosis were considered with the presenting HPI.  Medications  furosemide (LASIX) injection 40 mg (40 mg Intravenous Given 09/21/20 1305)    Vitals:   09/21/20 1059 09/21/20 1140 09/21/20 1309 09/21/20 1418  BP: 129/77 125/83 135/81 (!) 143/89  Pulse: 67 66 66 70  Resp: 18 20 13 18    Temp: 98.9 F (37.2 C)     TempSrc: Oral     SpO2: 98% 100% 100% 100%  Weight:      Height:        Final diagnoses:  None    Admission/ observation were discussed with the admitting physician, patient and/or family and they are comfortable with the plan.   Final Clinical Impression(s) / ED Diagnoses Final diagnoses:  None    Rx / DC Orders ED Discharge Orders     None        , DO 09/21/20 1424

## 2020-09-21 NOTE — ED Notes (Signed)
Report called to Ray RN at [facility] [phone]

## 2020-09-21 NOTE — Progress Notes (Signed)
Cardiology Consultation:    Date:  09/21/2020   ID:  Kevin Long, DOB November 01, 1960, MRN [id number]  PCP:  Fleet Contras, MD  Cardiologist:  Gypsy Balsam, MD   Referring MD: Bjorn Pippin, MD   Chief Complaint  Patient presents with   Congestive Heart Failure    History of Present Illness:    Kevin Long is a 59 y.o. male who is being seen today for the evaluation of congestive heart failure at the request of Bjorn Pippin, MD. with complex past medical history.  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  He was seen by cardiology last time last year he was noted to be in decompensated congestive heart failure as well as the fact that he was noncompliant at the time he stopped almost all his medication for about 1 to 2 years.  His past medical history is also significant for morbid obesity, essential hypertension, obstructive sleep apnea hypercholesterolemia, diabetes.  He was referred to [place] to be evaluated for his congestive heart failure.  For last 2 months he has been gradually gaining weight he does have increased belly size, he does have significant scrotal edema to the point that he end up going to urologist and Foley catheter has been placed.  He does have paroxysmal nocturnal dyspnea.  He cannot do much without getting short of breath.  He cannot lay flat.  He sleeps in the chair.  Denies have any chest pain tightness squeezing pressure burning chest but he described to have sensation of fullness in his abdomen.   Past Medical History:  Diagnosis Date   Abnormal LFTs    Acute metabolic encephalopathy 08/12/2019   Acute pulmonary embolism without acute cor pulmonale (HCC) 08/12/2019   Acute respiratory failure with hypoxemia (HCC) 08/12/2019   AKI (acute kidney injury) (HCC) 08/12/2019   ANGINA, STABLE 02/09/2007   Qualifier: History of  By: Yetta Barre CNA/MA, Jessica     Asthma     ASTHMA 02/09/2007   Qualifier: Diagnosis of  By: Yetta Barre CNA/MA, Jessica     Bacteremia 09/19/2020   Cardiomyopathy (HCC) 08/13/2015   Chronic combined systolic and diastolic congestive heart failure (HCC) 09/25/2016   Degeneration of lumbosacral intervertebral disc 09/19/2020   Diabetes mellitus, type 2 (HCC)    Essential hypertension 02/09/2007   Qualifier: Diagnosis of  By: Yetta Barre CNA/MA, Jessica     Gout 09/19/2020   Hypercholesterolemia    Hypertension    Lactic acidosis 08/12/2019   Mass of upper lobe of right lung 08/12/2019   MSSA bacteremia    Obesity    OBSTRUCTIVE SLEEP APNEA 02/09/2007   Qualifier: Diagnosis of  By: Yetta Barre CNA/MA, Jessica     OSA (obstructive sleep apnea)    Other and unspecified hyperlipidemia 02/09/2007   Qualifier: Diagnosis of  By: Yetta Barre CNA/MA, Shanda Bumps     Other primary cardiomyopathies    Pain in joint involving pelvic region and thigh 09/19/2020   Pain in joint, lower leg 09/19/2020   Palpitations 08/13/2015   Pneumonia of right upper lobe due to methicillin susceptible Staphylococcus aureus (MSSA) (HCC)    PVC's (premature ventricular contractions) 03/28/2016   Sepsis due to COVID-19 (HCC) 08/12/2019   Tobacco use disorder 09/19/2020   Type 2 or unspecified type diabetes mellitus 02/09/2007   Qualifier: Diagnosis of  By: Yetta Barre CNA/MA, Jessica     Uncontrolled type 2 diabetes mellitus with hyperglycemia, with long-term current  use of insulin (HCC) 08/12/2019   Vertigo 09/25/2016    Past Surgical History:  Procedure Laterality Date   FOOT SURGERY     has pin and screws   LEFT AND RIGHT HEART CATHETERIZATION WITH CORONARY ANGIOGRAM N/A 08/04/2012   Procedure: LEFT AND RIGHT HEART CATHETERIZATION WITH CORONARY ANGIOGRAM;  Surgeon: Corky Crafts, MD;  Location: [facility] CATH LAB;  Service: Cardiovascular;  Laterality: N/A;    Current Medications: Current Meds  Medication Sig   acetaminophen (TYLENOL) 500 MG tablet Take 1,000 mg by mouth every 6 (six)  hours as needed for headache (pain).    albuterol (PROVENTIL) (2.5 MG/3ML) 0.083% nebulizer solution Take 2.5 mg by nebulization every 6 (six) hours as needed for wheezing or shortness of breath.   albuterol (VENTOLIN HFA) 108 (90 Base) MCG/ACT inhaler Inhale 2 puffs into the lungs 4 (four) times daily as needed for wheezing or shortness of breath.    Ascorbic Acid (VITAMIN C) 1000 MG tablet Take 1,000 mg by mouth daily.    carvedilol (COREG) 6.25 MG tablet Take 1 tablet (6.25 mg total) by mouth 2 (two) times daily with a meal.   cephALEXin (KEFLEX) 500 MG capsule Take 500 mg by mouth 2 (two) times daily.   Cholecalciferol (VITAMIN D) 50 MCG (2000 UT) tablet Take 2,000 Units by mouth daily.   diclofenac Sodium (VOLTAREN) 1 % GEL Apply 1 application topically daily.   furosemide (LASIX) 40 MG tablet Take 40 mg by mouth daily as needed for fluid or edema (leg swelling).   gabapentin (NEURONTIN) 100 MG capsule Take 1 capsule (100 mg total) by mouth 3 (three) times daily.   insulin aspart (NOVOLOG FLEXPEN) 100 UNIT/ML FlexPen 0-20 Units, Subcutaneous, 3 times daily with meals CBG < 70: Implement Hypoglycemia measures CBG 70 - 120: 0 units CBG 121 - 150: 3 units CBG 151 - 200: 4 units CBG 201 - 250: 7 units CBG 251 - 300: 11 units CBG 301 - 350: 15 units CBG 351 - 400: 20 units CBG > 400: call MD (Patient taking differently: 20 Units as directed. 0-20 Units, Subcutaneous, 3 times daily with meals CBG < 70: Implement Hypoglycemia measures CBG 70 - 120: 0 units CBG 121 - 150: 3 units CBG 151 - 200: 4 units CBG 201 - 250: 7 units CBG 251 - 300: 11 units CBG 301 - 350: 15 units CBG 351 - 400: 20 units CBG > 400: call MD)   insulin glargine (LANTUS SOLOSTAR) 100 UNIT/ML Solostar Pen Inject 28 Units into the skin at bedtime.   isosorbide-hydrALAZINE (BIDIL) 20-37.5 MG tablet Take 1 tablet by mouth 3 (three) times daily.   LINZESS 145 MCG CAPS capsule Take 145 mcg by mouth daily.   Vitamin D, Ergocalciferol,  (DRISDOL) 1.25 MG (50000 UNIT) CAPS capsule Take 1 capsule by mouth once a week.   Zinc 50 MG TABS Take 50 mg by mouth daily.      Allergies:   Celebrex [celecoxib], Entresto [sacubitril-valsartan], and Rosuvastatin   Social History   Socioeconomic History   Marital status: Single    Spouse name: Not on file   Number of children: Not on file   Years of education: Not on file   Highest education level: Not on file  Occupational History   Not on file  Tobacco Use   Smoking status: Former    Packs/day: 1.00    Years: 25.00    Pack years: 25.00    Types: Cigarettes    Quit  date: 03/31/2005    Years since quitting: 15.4   Smokeless tobacco: Never  Vaping Use   Vaping Use: Never used  Substance and Sexual Activity   Alcohol use: No   Drug use: No   Sexual activity: Not on file  Other Topics Concern   Not on file  Social History Narrative   Not on file   Social Determinants of Health   Financial Resource Strain: Not on file  Food Insecurity: Not on file  Transportation Needs: Not on file  Physical Activity: Not on file  Stress: Not on file  Social Connections: Not on file     Family History: The patient's family history includes Anxiety disorder in his brother; Depression in his son; Diabetes in his mother; Heart murmur in his son. There is no history of Heart attack, Hypertension, or Stroke. ROS:   Please see the history of present illness.    All 14 point review of systems negative except as described per history of present illness.  EKGs/Labs/Other Studies Reviewed:    The following studies were reviewed today: I did review echocardiogram from year ago which showed ejection fraction 20%  EKG:  EKG is  ordered today.  The ekg ordered today demonstrates normal sinus rhythm rate 80, normal P interval, low voltage, PVCs, cannot rule out inferior wall MI as well as anterolateral  Recent Labs: 09/17/2020: B Natriuretic Peptide 851.4; BUN 13; Creatinine, Ser 1.24;  Hemoglobin 11.2; Platelets 315; Potassium 3.7; Sodium 138  Recent Lipid Panel    Component Value Date/Time   CHOL 239 (H) 09/25/2015 0921   TRIG 159 (H) 08/11/2019 1615   HDL 54 09/25/2015 0921   CHOLHDL 4.4 09/25/2015 0921   VLDL 30 09/25/2015 0921   LDLCALC 155 (H) 09/25/2015 0921    Physical Exam:    VS:  BP 138/72 (BP Location: Left Arm, Patient Position: Sitting)   Pulse 81   Ht 5\' 6"  (1.676 m)   Wt 260 lb (117.9 kg)   SpO2 90%   BMI 41.97 kg/m     Wt Readings from Last 3 Encounters:  09/21/20 260 lb (117.9 kg)  09/17/20 260 lb (117.9 kg)  09/15/19 250 lb 3.2 oz (113.5 kg)     GEN:  Well nourished, well developed in no acute distress, morbidly obese, mild discomfort HEENT: Normal NECK: JVD is full even while sitting upright LYMPHATICS: No lymphadenopathy CARDIAC: RRR, no murmurs, no rubs, no gallops tonsil distant RESPIRATORY:  Clear to auscultation without rales, wheezing or rhonchi to enter bilaterally with few crackles both bases ABDOMEN: Distended hard but no rebound tenderness no pain.  Difficult to palpate liver.  He is sitting in the wheelchair in my room and cannot get on the bed. MUSCULOSKELETAL: 1+ edema no deformity  SKIN: Warm and dry NEUROLOGIC:  Alert and oriented x 3 PSYCHIATRIC:  Normal affect   ASSESSMENT:    1. Acute on chronic combined systolic and diastolic CHF (congestive heart failure) (HCC)   2. Dilated cardiomyopathy (HCC)   3. Obesity due to excess calories, unspecified classification, unspecified whether serious comorbidity present   4. OBSTRUCTIVE SLEEP APNEA   5. Abnormal LFTs    PLAN:    In order of problems listed above:  Acute on chronic congestive heart failure.  New [place] Heart Association class III/IV.  In my opinion he will be best served by being admitted to the hospital he required quick evaluation which included echocardiogram.  He required IV diuresis and careful monitoring.  I  think the best place to do it will be  hospital.  Therefore, I will ask him to go to the emergency room in med center and then he will be transferred to [facility] for proper evaluation.  He can benefit from seeing advanced congestive heart failure team. He does have a low voltage EKG which could be related to obesity as well as to her cardiomyopathy however echocardiogram need to be done make sure he does not have significant pericardial effusion. Obstructive sleep apnea that need to be managed with CPAP mask. Diabetes that should be addressed by internal medicine team and favorably endocrinology.  Overall he is a very sick gentleman gradually deteriorating.  I think reached the point that best management will be in the hospital.  He required quick evaluation and proper management.   Medication Adjustments/Labs and Tests Ordered: Current medicines are reviewed at length with the patient today.  Concerns regarding medicines are outlined above.  No orders of the defined types were placed in this encounter.  No orders of the defined types were placed in this encounter.   Signed, Georgeanna Lea, MD, [facility]. 09/21/2020 10:39 AM    [facility]

## 2020-09-21 NOTE — Plan of Care (Signed)

## 2020-09-21 NOTE — Progress Notes (Signed)
Received a phone call from Facility: [facility]  Requesting MD: Dr. Adela Lank Patient with h/o HTN, OSA, DM2, HLD, cardiomyopathy, CHF, gout presenting with CHF exacerbation. Seen by cardiologist today who felt he would be best managed in the hospital for diuresis and management. Plan of care: IV diuresis, echo, CHF exacerbation management.  The patient will be accepted for admission to telemetry at [facility] when bed is available. Admitting doctor to consult advanced CHF team.    Nursing staff, Please call the [facility] Admits & Consults System-Wide number at the top of Amion at the time of the patient's arrival so that the patient can be paged to the admitting physician.   Orland Mustard, M.D. [facility]

## 2020-09-21 NOTE — H&P (Signed)
History and Physical    Kevin Long ZOX:[id number] DOB: 09-03-1960 DOA: 09/21/2020  PCP: Fleet Contras, MD Consultants:  cardiology: Dr. Bing Matter, pulm: Dr. Wynona Neat, I urology: Dr. Wilson Singer Patient coming from: [facility].  Home - lives with male partner and son  Chief Complaint: fluid overload, especially in his testicles.   HPI: Kevin Long is a 60 y.o. male with medical history significant for PVC, nonischemic cardiomyopathy, systolic CHF with EF of 20-25%, OSA, DM2, HTN, BPPV, PE secondary to covid in 2021,  obesity who was seen at his cardiologist office today and found to have excess fluid overload in acute on chronic CHF.  Advised to go to hospital for inpatient management, echo and diuresis. He states he is swollen all over, especially his scrotom and stomach. He states he has slowly been retaining fluid over the past 2 months. He is short of breath, more so with exertion and can no longer walk as far as he used to without breaks. He has gained weight 20+ pounds over last 2 months. He has coughing when he gets out of breath. Also endorses orthopnea. He denies any chest pain or palpitations. No fever/chills. No N/V/d or abdominal pain, but his abdomen feels "full."   Recently saw urology on 6/20 for urinary retention and they placed a catheter. Took catheter out 2 days ago. Is urinating on his own.    ED Course: vitals in ER: bp: 129/77, HR: 67, afebrile, RR: 18, oxygen 98%. BNP >1000, CXR with no infiltrate or fluid overload. Given bolus lasix in ER. We were asked to admit for inpatient treatment of CHF exacerbation/fluid overload.   Review of Systems: As per HPI; otherwise review of systems reviewed and negative.   Ambulatory Status:  Ambulates with cane. Has had a fall in past 3 months.    Past Medical History:  Diagnosis Date   Abnormal LFTs    Acute metabolic encephalopathy 08/12/2019   Acute pulmonary embolism without acute cor pulmonale (HCC) 08/12/2019   Acute respiratory  failure with hypoxemia (HCC) 08/12/2019   AKI (acute kidney injury) (HCC) 08/12/2019   ANGINA, STABLE 02/09/2007   Qualifier: History of  By: Yetta Barre CNA/MA, Jessica     Asthma    ASTHMA 02/09/2007   Qualifier: Diagnosis of  By: Yetta Barre CNA/MA, Jessica     Bacteremia 09/19/2020   Cardiomyopathy (HCC) 08/13/2015   Chronic combined systolic and diastolic congestive heart failure (HCC) 09/25/2016   Degeneration of lumbosacral intervertebral disc 09/19/2020   Diabetes mellitus, type 2 (HCC)    Essential hypertension 02/09/2007   Qualifier: Diagnosis of  By: Yetta Barre CNA/MA, Jessica     Gout 09/19/2020   Hypercholesterolemia    Hypertension    Lactic acidosis 08/12/2019   Mass of upper lobe of right lung 08/12/2019   MSSA bacteremia    Obesity    OBSTRUCTIVE SLEEP APNEA 02/09/2007   Qualifier: Diagnosis of  By: Yetta Barre CNA/MA, Jessica     OSA (obstructive sleep apnea)    Other and unspecified hyperlipidemia 02/09/2007   Qualifier: Diagnosis of  By: Yetta Barre CNA/MA, Shanda Bumps     Other primary cardiomyopathies    Pain in joint involving pelvic region and thigh 09/19/2020   Pain in joint, lower leg 09/19/2020   Palpitations 08/13/2015   Pneumonia of right upper lobe due to methicillin susceptible Staphylococcus aureus (MSSA) (HCC)    PVC's (premature ventricular contractions) 03/28/2016   Sepsis due to COVID-19 (HCC) 08/12/2019   Tobacco use disorder 09/19/2020   Type 2 or unspecified  type diabetes mellitus 02/09/2007   Qualifier: Diagnosis of  By: Yetta Barre CNA/MA, Jessica     Uncontrolled type 2 diabetes mellitus with hyperglycemia, with long-term current use of insulin (HCC) 08/12/2019   Vertigo 09/25/2016    Past Surgical History:  Procedure Laterality Date   FOOT SURGERY     has pin and screws   LEFT AND RIGHT HEART CATHETERIZATION WITH CORONARY ANGIOGRAM N/A 08/04/2012   Procedure: LEFT AND RIGHT HEART CATHETERIZATION WITH CORONARY ANGIOGRAM;  Surgeon: Corky Crafts, MD;  Location: [facility] CATH LAB;   Service: Cardiovascular;  Laterality: N/A;    Social History   Socioeconomic History   Marital status: Single    Spouse name: Not on file   Number of children: Not on file   Years of education: Not on file   Highest education level: Not on file  Occupational History   Not on file  Tobacco Use   Smoking status: Former    Packs/day: 1.00    Years: 25.00    Pack years: 25.00    Types: Cigarettes    Quit date: 03/31/2005    Years since quitting: 15.4   Smokeless tobacco: Never  Vaping Use   Vaping Use: Never used  Substance and Sexual Activity   Alcohol use: No   Drug use: No   Sexual activity: Not on file  Other Topics Concern   Not on file  Social History Narrative   Not on file   Social Determinants of Health   Financial Resource Strain: Not on file  Food Insecurity: Not on file  Transportation Needs: Not on file  Physical Activity: Not on file  Stress: Not on file  Social Connections: Not on file  Intimate Partner Violence: Not on file    Allergies  Allergen Reactions   Celebrex [Celecoxib] Swelling    Swelling in face   Entresto [Sacubitril-Valsartan] Other (See Comments)    Dizziness   Rosuvastatin Hives    Family History  Problem Relation Age of Onset   Diabetes Mother    Anxiety disorder Brother    Depression Son    Heart murmur Son    Heart attack Neg Hx    Hypertension Neg Hx    Stroke Neg Hx     Prior to Admission medications   Medication Sig Start Date End Date Taking? Authorizing Provider  acetaminophen (TYLENOL) 500 MG tablet Take 1,000 mg by mouth every 6 (six) hours as needed for headache (pain).     [provider]  albuterol (PROVENTIL) (2.5 MG/3ML) 0.083% nebulizer solution Take 2.5 mg by nebulization every 6 (six) hours as needed for wheezing or shortness of breath.    [provider]  albuterol (VENTOLIN HFA) 108 (90 Base) MCG/ACT inhaler Inhale 2 puffs into the lungs 4 (four) times daily as needed for wheezing or  shortness of breath.     [provider]  Ascorbic Acid (VITAMIN C) 1000 MG tablet Take 1,000 mg by mouth daily.     [provider]  carvedilol (COREG) 6.25 MG tablet Take 1 tablet (6.25 mg total) by mouth 2 (two) times daily with a meal. 08/19/19   Ghimire, Werner Lean, MD  cephALEXin (KEFLEX) 500 MG capsule Take 500 mg by mouth 2 (two) times daily.    [provider]  Cholecalciferol (VITAMIN D) 50 MCG (2000 UT) tablet Take 2,000 Units by mouth daily.    [provider]  diclofenac Sodium (VOLTAREN) 1 % GEL Apply 1 application topically daily. 08/17/20  [provider]  furosemide (LASIX) 40 MG tablet Take 40 mg by mouth daily as needed for fluid or edema (leg swelling).    [provider]  gabapentin (NEURONTIN) 100 MG capsule Take 1 capsule (100 mg total) by mouth 3 (three) times daily. 08/19/19   Ghimire, Werner Lean, MD  insulin aspart (NOVOLOG FLEXPEN) 100 UNIT/ML FlexPen 0-20 Units, Subcutaneous, 3 times daily with meals CBG < 70: Implement Hypoglycemia measures CBG 70 - 120: 0 units CBG 121 - 150: 3 units CBG 151 - 200: 4 units CBG 201 - 250: 7 units CBG 251 - 300: 11 units CBG 301 - 350: 15 units CBG 351 - 400: 20 units CBG > 400: call MD Patient taking differently: 20 Units as directed. 0-20 Units, Subcutaneous, 3 times daily with meals CBG < 70: Implement Hypoglycemia measures CBG 70 - 120: 0 units CBG 121 - 150: 3 units CBG 151 - 200: 4 units CBG 201 - 250: 7 units CBG 251 - 300: 11 units CBG 301 - 350: 15 units CBG 351 - 400: 20 units CBG > 400: call MD 08/19/19   Maretta Bees, MD  insulin glargine (LANTUS SOLOSTAR) 100 UNIT/ML Solostar Pen Inject 28 Units into the skin at bedtime. 08/19/19   Ghimire, Werner Lean, MD  isosorbide-hydrALAZINE (BIDIL) 20-37.5 MG tablet Take 1 tablet by mouth 3 (three) times daily. 06/22/19   Yates Decamp, MD  LINZESS 145 MCG CAPS capsule Take 145 mcg by mouth daily. 09/11/20   [provider]  Vitamin D,  Ergocalciferol, (DRISDOL) 1.25 MG (50000 UNIT) CAPS capsule Take 1 capsule by mouth once a week. 09/15/20   [provider]  Zinc 50 MG TABS Take 50 mg by mouth daily.     [provider]    Physical Exam: Vitals:   09/21/20 1309 09/21/20 1418 09/21/20 1634 09/21/20 1637  BP: 135/81 (!) 143/89 132/79 132/79  Pulse: 66 70 74 74  Resp: 13 18 16 16   Temp:   97.6 F (36.4 C) 97.6 F (36.4 C)  TempSrc:      SpO2: 100% 100% 97%   Weight:      Height:         General:  Appears calm and comfortable and is in NAD Eyes:  PERRL, EOMI, normal lids, iris ENT:  grossly normal hearing, lips & tongue, mmm; poor dentition Neck:  no LAD, masses or thyromegaly; no carotid bruits Cardiovascular:  RRR, no m/r/g. edema bilateral lower extremities up to abdomen.  Pitting edema in ankles Skin hard with some peeling.  Respiratory:   CTA bilaterally with no wheezes/rales/rhonchi.  Normal respiratory effort. Abdomen:  soft, NT, distended, NABS. No rebound or guarding. Scrotum with edema.  Back:   normal alignment, no CVAT Skin:  no rash or induration seen on limited exam. Hardened and dry skin on lower anterior legs.  Musculoskeletal:  grossly normal tone BUE/BLE, good ROM, no bony abnormality Lower extremity. Limited foot exam with no ulcerations.  1+ distal pulses. Psychiatric:  grossly normal mood and affect, speech fluent and appropriate, AOx3 Neurologic:  CN 2-12 grossly intact, moves all extremities in coordinated fashion, sensation intact    Radiological Exams on Admission: Independently reviewed - see discussion in A/P where applicable  DG Chest Port 1 View  Result Date: 09/21/2020 CLINICAL DATA:  Shortness of breath and swelling. EXAM: PORTABLE CHEST 1 VIEW COMPARISON:  09/17/2020 and older studies. FINDINGS: Stable mild to moderate. Enlargement of the cardiopericardial silhouette, no mediastinal or  hilar masses. Clear lungs.  No convincing pleural effusion or pneumothorax.  Skeletal structures are grossly intact. IMPRESSION: 1. No acute cardiopulmonary disease.  No evidence pulmonary edema. 2. Stable cardiomegaly. Electronically Signed   By: Amie Portlandavid  Ormond M.D.   On: 09/21/2020 11:40    EKG: Independently reviewed.  NSR with rate 61; nonspecific ST changes with no evidence of acute ischemia   Labs on Admission: I have personally reviewed the available labs and imaging studies at the time of the admission.  Pertinent labs:  INR: 1.5 BNP: 1031.1 Lipase: 53 Glucose: 127 Cbc: hgb: 11.4 Total bili: 3.4   Assessment/Plan Principal Problem:   Acute exacerbation of CHF (congestive heart failure) (HCC) BNP>1031 with anasarca/weight gain/SOB/orthopnea -poor compliance with medication at home and follow up, but does state he takes his lasix which was recently started by PCP -echo pending to rule out pericardial effusion per primary cardiologist  -CHF team consulted. Paged have not talked to them.  -I/O, daily weights.  -responded well to lasix IV in ER with good urine output. Likely will need BID dosing, but leave daily for now to see output and really unsure if taking his lasix daily.  -not candidate for ICD due to noncompliance. Allergy to entresto.  -ACEI per cardiology, has not been started. Has had low pressures. Will defer to CHF team.  -magnesium pending. Potassium wnl, but monitor electrolytes daily. -on tele   Active Problems:   Anasarca -likely secondary to acute exacerbation of CHF with known severely reduced EF. Responded well to lasix, will continue to monitor I/O, daily weights and response to lasix.   Elevated total bili/INR -chronically elevated INR -liver enzymes normal, t.bili elevated. Has not eaten. ? Gilbert's./impaired outflow Liver US in 07/2019 normal parenchymal echogenicity.  Will trend and recheck in AM    Essential hypertension -on soft side. Will continue home coreg -not candidate for entresto d/t noncompliance and apparently  dizziness.  -no ACE-I has been started and was deferred to cardiology on last d/c in 2021 then he was lost to follow up with poor compliance.  Will leave up to heart failure team and will monitor pressures since has been on lower side.     Uncontrolled type 2 diabetes mellitus with hyperglycemia, with long-term current use of insulin (HCC) -a1c pending -on SSI at home, but does not use due to low BS -SSI here with accuchecks. Will hold off on HS coverage until a1c/CBG monitored due to him endorsing low sugars.     Pulmonary embolism associated with COVID-19 (HCC) --07/2019. Completed his eliquis for provoked PE.     Hyperlipidemia -lipid panel pending. Allergy to crestor.     OBSTRUCTIVE SLEEP APNEA  -does not wear cpap mask at home nor is he complaint with oxygen at night. Does not want.  -will order oxygen via [facility] and hopefully he can work with pulm to be more compliant with this.   Fall/debilitation -PT ordered to eval  Body mass index is 41.95 kg/m.   Level of care: Telemetry Medical DVT prophylaxis:  lovenox  Code Status:  Full - confirmed with patient Family Communication: partner: Christianne Dolinlinda barnes and son Consuello BossierCarlton Fellner Jr. Present.  Disposition Plan:  The patient is from: home  Anticipated [facility] once [facility] issues have been resolved.   Consults called: CHF team-paged. Will need to call tomorrow.    Admission status:  inpatient    Orland MustardAllison Jsiah Menta MD [facility]   How to contact the  [facility] Attending or Consulting provider 7A - 7P or covering provider during after hours 7P -7A, for this patient?  Check the care team in [facility] and look for a) attending/consulting [facility] provider listed and b) the [facility] team listed Log into [email or web address] and use [facility] universal password to access. If you do not have the password, please contact the hospital operator. Locate the [facility] provider you are looking for under [facility] and page to a number  that you can be directly reached. If you still have difficulty reaching the provider, please page the [facility] (Director on Call) for the Hospitalists listed on amion for assistance.   09/21/2020, 6:17 PM

## 2020-09-21 NOTE — ED Notes (Signed)
OK for pt. To eat per Dr. Adela Lank.

## 2020-09-21 NOTE — ED Notes (Signed)
Pt. Provided meal

## 2020-09-21 NOTE — ED Notes (Signed)
Urinal at bedside.  

## 2020-09-22 ENCOUNTER — Inpatient Hospital Stay (HOSPITAL_COMMUNITY): Payer: Medicaid Other

## 2020-09-22 DIAGNOSIS — I5023 Acute on chronic systolic (congestive) heart failure: Secondary | ICD-10-CM

## 2020-09-22 DIAGNOSIS — R601 Generalized edema: Secondary | ICD-10-CM

## 2020-09-22 DIAGNOSIS — I5021 Acute systolic (congestive) heart failure: Secondary | ICD-10-CM

## 2020-09-22 LAB — BASIC METABOLIC PANEL
Anion gap: 10 (ref 5–15)
Anion gap: 7 (ref 5–15)
BUN: 9 mg/dL (ref 8–23)
BUN: 9 mg/dL (ref 8–23)
CO2: 26 mmol/L (ref 22–32)
CO2: 29 mmol/L (ref 22–32)
Calcium: 9 mg/dL (ref 8.9–10.3)
Calcium: 8.9 mg/dL (ref 8.9–10.3)
Chloride: 99 mmol/L (ref 98–111)
Chloride: 101 mmol/L (ref 98–111)
Creatinine, Ser: 1.11 mg/dL (ref 0.61–1.24)
Creatinine, Ser: 1.12 mg/dL (ref 0.61–1.24)
GFR, Estimated: >60 mL/min (ref >60)
GFR, Estimated: >60 mL/min (ref >60)
Glucose, Bld: 129 mg/dL — ABNORMAL HIGH (ref 70–99)
Glucose, Bld: 120 mg/dL — ABNORMAL HIGH (ref 70–99)
Potassium: 2.8 mmol/L — ABNORMAL LOW (ref 3.5–5.1)
Potassium: 3.4 mmol/L — ABNORMAL LOW (ref 3.5–5.1)
Sodium: 135 mmol/L (ref 135–145)
Sodium: 137 mmol/L (ref 135–145)

## 2020-09-22 LAB — MAGNESIUM
Magnesium: 1.8 mg/dL (ref 1.7–2.4)
Magnesium: 1.8 mg/dL (ref 1.7–2.4)

## 2020-09-22 LAB — HEPATIC FUNCTION PANEL
ALT: 13 U/L (ref 0–44)
AST: 24 U/L (ref 15–41)
Albumin: 3.5 g/dL (ref 3.5–5.0)
Alkaline Phosphatase: 107 U/L (ref 38–126)
Bilirubin, Direct: 1 mg/dL — ABNORMAL HIGH (ref 0.0–0.2)
Indirect Bilirubin: 1.8 mg/dL — ABNORMAL HIGH (ref 0.3–0.9)
Total Bilirubin: 2.8 mg/dL — ABNORMAL HIGH (ref 0.3–1.2)
Total Protein: 6.7 g/dL (ref 6.5–8.1)

## 2020-09-22 LAB — ECHOCARDIOGRAM COMPLETE
AR max vel: 1.13 cm2
AV Area VTI: 1.07 cm2
AV Area mean vel: 1.01 cm2
AV Mean grad: 3 mmHg
AV Peak grad: 5.7 mmHg
Ao pk vel: 1.19 m/s
Area-P 1/2: 5.38 cm2
Height: 66 in
S' Lateral: 5.6 cm
Weight: 4371.2 oz

## 2020-09-22 LAB — GLUCOSE, CAPILLARY
Glucose-Capillary: 172 mg/dL — ABNORMAL HIGH (ref 70–99)
Glucose-Capillary: 147 mg/dL — ABNORMAL HIGH (ref 70–99)
Glucose-Capillary: 77 mg/dL (ref 70–99)
Glucose-Capillary: 132 mg/dL — ABNORMAL HIGH (ref 70–99)

## 2020-09-22 MED ORDER — MAGNESIUM SULFATE 2 GM/50ML IV SOLN
2.0000 g | Freq: Once | INTRAVENOUS | Status: AC
  Administered 2020-09-22: 2 g via INTRAVENOUS
  Filled 2020-09-22: qty 50

## 2020-09-22 MED ORDER — POTASSIUM CHLORIDE CRYS ER 20 MEQ PO TBCR
40.0000 meq | EXTENDED_RELEASE_TABLET | ORAL | Status: AC
  Administered 2020-09-22 (×3): 40 meq via ORAL
  Filled 2020-09-22 (×3): qty 2

## 2020-09-22 MED ORDER — FUROSEMIDE 10 MG/ML IJ SOLN
40.0000 mg | Freq: Two times a day (BID) | INTRAMUSCULAR | Status: DC
  Administered 2020-09-22 – 2020-09-23 (×2): 40 mg via INTRAVENOUS
  Filled 2020-09-22 (×2): qty 4

## 2020-09-22 MED ORDER — PERFLUTREN LIPID MICROSPHERE
1.0000 mL | INTRAVENOUS | Status: AC | PRN
  Administered 2020-09-22: 2 mL via INTRAVENOUS

## 2020-09-22 NOTE — Progress Notes (Addendum)
PROGRESS NOTE    Kevin Long   AXE:[id number]  DOB: 04/22/1960  PCP: Fleet Contras, MD    DOA: 09/21/2020 LOS: 1   Assessment & Plan   Principal Problem:   Acute exacerbation of CHF (congestive heart failure) (HCC) Active Problems:   Hyperlipidemia   OBSTRUCTIVE SLEEP APNEA   Essential hypertension   Uncontrolled type 2 diabetes mellitus with hyperglycemia, with long-term current use of insulin (HCC)   Anasarca   Pulmonary embolism associated with COVID-19 (HCC)   Acute on chronic systolic CHF -echo in May 2021 had EF less than 20% global LV hypokinesis, severely reduced RV systolic function. Presented with BNP 1031 with associated anasarca, weight gain, dyspnea and orthopnea.  History of poor compliance with medications at home, patient reports taking Lasix as prescribed --Responding well to 40 mg Lasix IV, increased to BID .--Follow-up pending echo --CHF team consulted, awaiting return page --I/O's and daily weights to monitor volume status --Monitor renal function and electrolytes --As per cardiology, not started on admission due to soft blood pressure --Allergy to Entresto  Anasarca -present on admission, due to decompensated CHF and severely reduced EF.  Improving with Lasix.  Management as above  Elevated total bili, INR -with normal LFTs.  Suspect due to liver congestion with volume overload.  Liver ultrasound in May 2021 showed normal liver parenchymal echogenicity. -- Follow LFTs  Essential hypertension -BP is soft on admission, monitor closely. --Continue Coreg --ACE inhibitor held on admission for now, start as BP tolerates per cardiology  Uncontrolled type 2 diabetes with hyperglycemia with long-term use of insulin -sliding scale NovoLog with no bedtime coverage as patient endorses hypoglycemic episodes.  History of pulmonary embolism associated with COVID-19 -in May 2021.  Completed treatment with Eliquis for provoked PE.  Hyperlipidemia -follow-up lipid  panel.  Crestor allergy noted.  Obstructive sleep apnea -does not use CPAP at home.   Nasal cannula oxygen qHS.  General debility /mechanical fall /generalized weakness -  Follow-up PT evaluation and recommendations for dispo planning  Morbid obesity: Body mass index is 44.1 kg/m.  Complicates overall care and prognosis.  Recommend lifestyle modifications including physical activity and diet for weight loss and overall long-term health.   DVT prophylaxis:    Diet:  Diet Orders (From admission, onward)     Start     Ordered   09/21/20 1804  Diet heart healthy/carb modified Room service appropriate? Yes; Fluid consistency: Thin  Diet effective now       Question Answer Comment  Diet-HS Snack? Nothing   Room service appropriate? Yes   Fluid consistency: Thin      09/21/20 1805              Code Status: Full Code   Brief Narrative / Hospital Course to Date:    Kevin Long is a 60 y.o. male with medical history significant for PVC, nonischemic cardiomyopathy, systolic CHF with EF of 20-25%, OSA, DM2, HTN, BPPV, PE secondary to covid in 2021,  obesity who was seen at his cardiologist office today (09/21/20) and found to have excess fluid overload in acute on chronic CHF and was referred to the hospital for IV diuresis and further evaluation.    Subjective 09/22/20    Patient reports doing well this morning.  Reports being thirsty.  Denies shortness of breath at rest has not been up out of bed.  Reports improvement in his leg swelling.  Denies other acute complaints including chest pain, fevers chills, nausea vomiting or diarrhea.  Disposition Plan & Communication   Status is: Inpatient  Remains inpatient appropriate because:IV treatments appropriate due to intensity of illness or inability to take PO  Dispo: The patient is from: Home              Anticipated d/c is to: Home              Patient currently is not medically stable to d/c.   Difficult to place patient  No      Consults, Procedures, Significant Events   Consultants:  Heart failure team  Procedures:  None  Antimicrobials:  Anti-infectives (From admission, onward)    Start     Dose/Rate Route Frequency Ordered Stop   09/21/20 2200  cephALEXin (KEFLEX) capsule 500 mg  Status:  Discontinued        500 mg Oral 2 times daily 09/21/20 1815 09/21/20 1839         Micro    Objective   Vitals:   09/21/20 2138 09/21/20 2348 09/22/20 0439 09/22/20 1213  BP: 108/69 131/83 131/80 120/82  Pulse: 74 72 73 68  Resp: 15 16 15 20   Temp: 98.5 F (36.9 C)  98.6 F (37 C) (!) 97.3 F (36.3 C)  TempSrc: Oral  Oral Oral  SpO2: 95% 97% 95%   Weight:   123.9 kg   Height:        Intake/Output Summary (Last 24 hours) at 09/22/2020 1435 Last data filed at 09/22/2020 1409 Gross per 24 hour  Intake --  Output 1400 ml  Net -1400 ml   Filed Weights   09/21/20 1055 09/21/20 1800 09/22/20 0439  Weight: 117.9 kg 123.6 kg 123.9 kg    Physical Exam:  General exam: awake, alert, no acute distress, morbid obesity HEENT: atraumatic, clear conjunctiva, anicteric sclera, moist mucus membranes, hearing grossly normal  Respiratory system: Diminished bases but generally clear, no wheezes, rales or rhonchi, normal respiratory effort. Cardiovascular system: normal S1/S2, RRR, tense bilateral lower extremity pitting edema extending proximally to trunk and scrotum.   Gastrointestinal system: soft, NT, ND, no HSM felt, +bowel sounds. Central nervous system: no gross focal neurologic deficits, normal speech Psychiatry: normal mood, congruent affect, judgement and insight appear normal  Labs   Data Reviewed: I have personally reviewed following labs and imaging studies  CBC: Recent Labs  Lab 09/17/20 2040 09/21/20 1117  WBC 7.9 8.1  NEUTROABS 5.6 5.8  HGB 11.2* 11.4*  HCT 34.3* 34.5*  MCV 89.3 88.9  PLT 315 331   Basic Metabolic Panel: Recent Labs  Lab 09/17/20 2040 09/21/20 1117  09/22/20 0213  NA 138 137 135  K 3.7 3.6 2.8*  CL 99 100 99  CO2 28 27 26   GLUCOSE 161* 127* 129*  BUN 13 11 9   CREATININE 1.24 1.04 1.11  CALCIUM 9.7 9.2 9.0  MG  --   --  1.8   GFR: Estimated Creatinine Clearance: 86.8 mL/min (by C-G formula based on SCr of 1.11 mg/dL). Liver Function Tests: Recent Labs  Lab 09/21/20 1117 09/22/20 0213  AST 24 24  ALT 13 13  ALKPHOS 121 107  BILITOT 3.4* 2.8*  PROT 7.6 6.7  ALBUMIN 4.0 3.5   Recent Labs  Lab 09/21/20 1117  LIPASE 53*   No results for input(s): AMMONIA in the last 168 hours. Coagulation Profile: Recent Labs  Lab 09/21/20 1117  INR 1.5*   Cardiac Enzymes: No results for input(s): CKTOTAL, CKMB, CKMBINDEX, TROPONINI in the last 168 hours. BNP (last 3  results) No results for input(s): PROBNP in the last 8760 hours. HbA1C: Recent Labs    09/21/20 1904  HGBA1C 7.1*   CBG: Recent Labs  Lab 09/21/20 1722 09/21/20 2137 09/22/20 0735 09/22/20 1058  GLUCAP 135* 156* 172* 147*   Lipid Profile: No results for input(s): CHOL, HDL, LDLCALC, TRIG, CHOLHDL, LDLDIRECT in the last 72 hours. Thyroid Function Tests: No results for input(s): TSH, T4TOTAL, FREET4, T3FREE, THYROIDAB in the last 72 hours. Anemia Panel: No results for input(s): VITAMINB12, FOLATE, FERRITIN, TIBC, IRON, RETICCTPCT in the last 72 hours. Sepsis Labs: No results for input(s): PROCALCITON, LATICACIDVEN in the last 168 hours.  Recent Results (from the past 240 hour(s))  Urine culture     Status: Abnormal   Collection Time: 09/17/20  8:41 PM   Specimen: Urine, Random  Result Value Ref Range Status   Specimen Description   Final    URINE, RANDOM Performed at [facility], [address], [place], [place] [address]    Special Requests   Final    NONE Performed at [facility], [address] [place], [place] [address]    Culture (A)  Final    <10,000 COLONIES/mL INSIGNIFICANT GROWTH Performed at  [facility], [address], [place], [place] [address]    Report Status 09/19/2020 FINAL  Final  Resp Panel by RT-PCR (Flu A&B, Covid) Nasopharyngeal Swab     Status: None   Collection Time: 09/21/20 11:17 AM   Specimen: Nasopharyngeal Swab; Nasopharyngeal(NP) swabs in vial transport medium  Result Value Ref Range Status   SARS Coronavirus 2 by RT PCR NEGATIVE NEGATIVE Final    Comment: (NOTE) SARS-CoV-2 target nucleic acids are NOT DETECTED.  The SARS-CoV-2 RNA is generally detectable in upper respiratory specimens during the acute phase of infection. The lowest concentration of SARS-CoV-2 viral copies this assay can detect is 138 copies/mL. A negative result does not preclude SARS-Cov-2 infection and should not be used as the sole basis for treatment or other patient management decisions. A negative result may occur with  improper specimen collection/handling, submission of specimen other than nasopharyngeal swab, presence of viral mutation(s) within the areas targeted by this assay, and inadequate number of viral copies(<138 copies/mL). A negative result must be combined with clinical observations, patient history, and epidemiological information. The expected result is Negative.  Fact Sheet for Patients:  BloggerCourse.com  Fact Sheet for Healthcare Providers:  SeriousBroker.it  This test is no t yet approved or cleared by the [place] FDA and  has been authorized for detection and/or diagnosis of SARS-CoV-2 by FDA under an Emergency Use Authorization (EUA). This EUA will remain  in effect (meaning this test can be used) for the duration of the COVID-19 declaration under Section 564(b)(1) of the Act, 21 U.S.C.section 360bbb-3(b)(1), unless the authorization is terminated  or revoked sooner.       Influenza A by PCR NEGATIVE NEGATIVE Final   Influenza B by PCR NEGATIVE NEGATIVE Final    Comment: (NOTE) The  Xpert Xpress SARS-CoV-2/FLU/RSV plus assay is intended as an aid in the diagnosis of influenza from Nasopharyngeal swab specimens and should not be used as a sole basis for treatment. Nasal washings and aspirates are unacceptable for Xpert Xpress SARS-CoV-2/FLU/RSV testing.  Fact Sheet for Patients: BloggerCourse.com  Fact Sheet for Healthcare Providers: SeriousBroker.it  This test is not yet approved or cleared by the [place] FDA and has been authorized for detection and/or diagnosis of SARS-CoV-2 by FDA  under an Emergency Use Authorization (EUA). This EUA will remain in effect (meaning this test can be used) for the duration of the COVID-19 declaration under Section 564(b)(1) of the Act, 21 U.S.C. section 360bbb-3(b)(1), unless the authorization is terminated or revoked.  Performed at [facility], [address], [place], [place] [address]       Imaging Studies   DG Chest [place] 1 View  Result Date: 09/21/2020 CLINICAL DATA:  Shortness of breath and swelling. EXAM: PORTABLE CHEST 1 VIEW COMPARISON:  09/17/2020 and older studies. FINDINGS: Stable mild to moderate. Enlargement of the cardiopericardial silhouette, no mediastinal or hilar masses. Clear lungs.  No convincing pleural effusion or pneumothorax. Skeletal structures are grossly intact. IMPRESSION: 1. No acute cardiopulmonary disease.  No evidence pulmonary edema. 2. Stable cardiomegaly. Electronically Signed   By: Amie Portland M.D.   On: 09/21/2020 11:40     Medications   Scheduled Meds:  aspirin EC  81 mg Oral Daily   carvedilol  12.5 mg Oral BID WC   enoxaparin (LOVENOX) injection  60 mg Subcutaneous Q24H   furosemide  40 mg Intravenous Daily   insulin aspart  0-20 Units Subcutaneous TID WC   potassium chloride  40 mEq Oral Q4H   sodium chloride flush  3 mL Intravenous Q12H   [START ON 09/24/2020] Vitamin D (Ergocalciferol)  50,000 Units Oral Q Mon    Continuous Infusions:  sodium chloride         LOS: 1 day    Time spent: 30 minutes    Pennie Banter, DO [facility]  09/22/2020, 2:35 PM      If 7PM-7AM, please contact night-coverage. How to contact the [facility] Attending or Consulting provider 7A - 7P or covering provider during after hours 7P -7A, for this patient?    Check the care team in [facility] and look for a) attending/consulting [facility] provider listed and b) the [facility] team listed Log into [email or web address] and use [place]'s universal password to access. If you do not have the password, please contact the hospital operator. Locate the [facility] provider you are looking for under [facility] and page to a number that you can be directly reached. If you still have difficulty reaching the provider, please page the [facility] (Director on Call) for the Hospitalists listed on amion for assistance.

## 2020-09-22 NOTE — Progress Notes (Signed)
TRH night shift.  The nursing staff reported the patient had a 16 beat burst of SVT.  His potassium was 2.9 mmol/L earlier, but was replaced during dayshift.  Magnesium is 1.8 mg/dL.  The patient is skating carvedilol 12.5 mg p.o. twice daily, but he is also on furosemide 40 mg every 12 hours and albuterol inhaler as needed.  Magnesium sulfate 2 g IVPB given earlier.  KCl 40 mEq p.o. daily ordered.  Sanda Klein, MD.

## 2020-09-22 NOTE — Evaluation (Signed)
Physical Therapy Evaluation Patient Details Name: Kevin Long MRN: [id number] DOB: [birth year] Today's Date: 09/22/2020   History of Present Illness  60 y.o. male admitted on 09/21/20 foracute on chronic CHF exacerbation, anasarca, elevated total bili, INR, likely due to volume overload, soft BPs on admission.  Pt with significant PMH of fall, PE DM2, essential HTN, morbid obesity, degeneration of the lumbosacral intervertebral disc. gout, OSA, PVCs, vertigo, and foot surgery.  Clinical Impression  Pt able to walk a good distance down the hallway with the support of the RW. Very wide BOS due to swollen scrotum and significant DOE 3/4 without decreased O2 sats.  Pt reports DOE is normal for him.  He needed to take two standing rest breaks.  He would benefit from follow up therapy to help him build up his strength and endurance and possibly transition off of RW.   PT to follow acutely for deficits listed below.       Follow Up Recommendations Home health PT    Equipment Recommendations  Rolling walker with 5" wheels;Other (comment) (wide RW)    Recommendations for Other Services OT consult     Precautions / Restrictions Precautions Precautions: Fall Precaution Comments: h/o fall      Mobility  Bed Mobility Overal bed mobility: Modified Independent                  Transfers Overall transfer level: Needs assistance Equipment used: Rolling walker (2 wheeled) Transfers: Sit to/from Stand Sit to Stand: Min guard         General transfer comment: Min guard for safety  Ambulation/Gait Ambulation/Gait assistance: Min guard Gait Distance (Feet): 250 Feet Assistive device: Rolling walker (2 wheeled) Gait Pattern/deviations: Step-through pattern;Wide base of support (wide BOS due to swollen scrotum) Gait velocity: decreased Gait velocity interpretation: 1.31 - 2.62 ft/sec, indicative of limited community ambulator General Gait Details: wide BOS, had pt use RW for energy  conservation and support given his swollen testicles.  DOE 3/4, 2 standing rest breaks during gait.  Stairs            Wheelchair Mobility    Modified Rankin (Stroke Patients Only)       Balance Overall balance assessment: Needs assistance Sitting-balance support: Feet supported;Bilateral upper extremity supported Sitting balance-Leahy Scale: Fair     Standing balance support: Bilateral upper extremity supported;No upper extremity supported Standing balance-Leahy Scale: Fair                               Pertinent Vitals/Pain Pain Assessment: No/denies pain    Home Living Family/patient expects to be discharged to:: Private residence Living Arrangements: Spouse/significant other;Children (son and fiancee) Available Help at Discharge: Family Type of Home: House       Home Layout: Multi-level;Bed/bath upstairs Home Equipment: None      Prior Function Level of Independence: Independent         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 Dominance   Dominant Hand: Right    Extremity/Trunk Assessment   Upper Extremity Assessment Upper Extremity Assessment: Defer to OT evaluation    Lower Extremity Assessment Lower Extremity Assessment: Generalized weakness (bil LE edema)    Cervical / Trunk Assessment Cervical / Trunk Assessment: Other exceptions Cervical / Trunk Exceptions: h/o degenerative disc in lumbar spine  Communication   Communication: No difficulties  Cognition Arousal/Alertness: Awake/alert Behavior During Therapy: WFL for tasks assessed/performed Overall Cognitive Status:  No family/caregiver present to determine baseline cognitive functioning                                 General Comments: Not specifically tested, conversation normal      General Comments General comments (skin integrity, edema, etc.): Pt did not feel he could comfortably sit in the recliner chair due to scotal swelling and reports  extremely tired from lack of sleep last night.    Exercises     Assessment/Plan    PT Assessment Patient needs continued PT services  PT Problem List Decreased strength;Decreased activity tolerance;Decreased balance;Decreased mobility;Decreased knowledge of use of DME;Cardiopulmonary status limiting activity;Obesity;Pain       PT Treatment Interventions DME instruction;Gait training;Stair training;Functional mobility training;Therapeutic activities;Therapeutic exercise;Balance training;Patient/family education    PT Goals (Current goals can be found in the Care Plan section)  Acute Rehab PT Goals Patient Stated Goal: to get his swelling down PT Goal Formulation: With patient Time For Goal Achievement: 10/06/20 Potential to Achieve Goals: Good    Frequency Min 3X/week   Barriers to discharge        Co-evaluation               AM-PAC PT "6 Clicks" Mobility  Outcome Measure Help needed turning from your back to your side while in a flat bed without using bedrails?: None Help needed moving from lying on your back to sitting on the side of a flat bed without using bedrails?: None Help needed moving to and from a bed to a chair (including a wheelchair)?: A Little Help needed standing up from a chair using your arms (e.g., wheelchair or bedside chair)?: A Little Help needed to walk in hospital room?: A Little Help needed climbing 3-5 steps with a railing? : A Little 6 Click Score: 20    End of Session Equipment Utilized During Treatment: Gait belt Activity Tolerance: Patient limited by fatigue Patient left: in bed;with call bell/phone within reach Nurse Communication: Mobility status PT Visit Diagnosis: Muscle weakness (generalized) (M62.81);Difficulty in walking, not elsewhere classified (R26.2);Pain Pain - Right/Left:  (scrotum) Pain - part of body:  (scrotum)    Time: 4742-5956 PT Time Calculation (min) (ACUTE ONLY): 30 min   Charges:   PT Evaluation $PT Eval  Moderate Complexity: 1 Mod PT Treatments $Gait Training: 8-22 mins        Corinna Capra, PT, DPT  Acute Rehabilitation Ortho Tech Supervisor [phone] pager [phone]) [phone] office

## 2020-09-23 ENCOUNTER — Encounter (HOSPITAL_COMMUNITY): Payer: Self-pay | Admitting: Family Medicine

## 2020-09-23 ENCOUNTER — Inpatient Hospital Stay (HOSPITAL_COMMUNITY): Payer: Medicaid Other

## 2020-09-23 DIAGNOSIS — I5043 Acute on chronic combined systolic (congestive) and diastolic (congestive) heart failure: Secondary | ICD-10-CM

## 2020-09-23 DIAGNOSIS — I472 Ventricular tachycardia: Secondary | ICD-10-CM

## 2020-09-23 DIAGNOSIS — I10 Essential (primary) hypertension: Secondary | ICD-10-CM

## 2020-09-23 LAB — LACTIC ACID, PLASMA
Lactic Acid, Venous: 2.5 mmol/L (ref 0.5–1.9)
Lactic Acid, Venous: 2.7 mmol/L (ref 0.5–1.9)

## 2020-09-23 LAB — COMPREHENSIVE METABOLIC PANEL
ALT: 12 U/L (ref 0–44)
AST: 24 U/L (ref 15–41)
Albumin: 3.7 g/dL (ref 3.5–5.0)
Alkaline Phosphatase: 110 U/L (ref 38–126)
Anion gap: 9 (ref 5–15)
BUN: 9 mg/dL (ref 8–23)
CO2: 27 mmol/L (ref 22–32)
Calcium: 9 mg/dL (ref 8.9–10.3)
Chloride: 102 mmol/L (ref 98–111)
Creatinine, Ser: 1.18 mg/dL (ref 0.61–1.24)
GFR, Estimated: >60 mL/min (ref >60)
Glucose, Bld: 111 mg/dL — ABNORMAL HIGH (ref 70–99)
Potassium: 3.8 mmol/L (ref 3.5–5.1)
Sodium: 138 mmol/L (ref 135–145)
Total Bilirubin: 3.5 mg/dL — ABNORMAL HIGH (ref 0.3–1.2)
Total Protein: 6.9 g/dL (ref 6.5–8.1)

## 2020-09-23 LAB — CBC
HCT: 33.4 % — ABNORMAL LOW (ref 39.0–52.0)
Hemoglobin: 11.1 g/dL — ABNORMAL LOW (ref 13.0–17.0)
MCH: 29.4 pg (ref 26.0–34.0)
MCHC: 33.2 g/dL (ref 30.0–36.0)
MCV: 88.4 fL (ref 80.0–100.0)
Platelets: 316 10*3/uL (ref 150–400)
RBC: 3.78 MIL/uL — ABNORMAL LOW (ref 4.22–5.81)
RDW: 17.1 % — ABNORMAL HIGH (ref 11.5–15.5)
WBC: 10.3 10*3/uL (ref 4.0–10.5)
nRBC: 0 % (ref 0.0–0.2)

## 2020-09-23 LAB — GLUCOSE, CAPILLARY
Glucose-Capillary: 188 mg/dL — ABNORMAL HIGH (ref 70–99)
Glucose-Capillary: 189 mg/dL — ABNORMAL HIGH (ref 70–99)
Glucose-Capillary: 117 mg/dL — ABNORMAL HIGH (ref 70–99)
Glucose-Capillary: 162 mg/dL — ABNORMAL HIGH (ref 70–99)
Glucose-Capillary: 123 mg/dL — ABNORMAL HIGH (ref 70–99)

## 2020-09-23 LAB — MAGNESIUM: Magnesium: 1.9 mg/dL (ref 1.7–2.4)

## 2020-09-23 MED ORDER — CARVEDILOL 6.25 MG PO TABS
6.2500 mg | ORAL_TABLET | Freq: Two times a day (BID) | ORAL | Status: DC
  Administered 2020-09-23: 6.25 mg via ORAL
  Filled 2020-09-23: qty 1

## 2020-09-23 MED ORDER — FUROSEMIDE 10 MG/ML IJ SOLN
80.0000 mg | Freq: Two times a day (BID) | INTRAMUSCULAR | Status: DC
  Administered 2020-09-23 – 2020-09-24 (×2): 80 mg via INTRAVENOUS
  Filled 2020-09-23 (×2): qty 8

## 2020-09-23 MED ORDER — SPIRONOLACTONE 12.5 MG HALF TABLET
12.5000 mg | ORAL_TABLET | Freq: Every day | ORAL | Status: DC
  Administered 2020-09-23 – 2020-09-28 (×6): 12.5 mg via ORAL
  Filled 2020-09-23 (×6): qty 1

## 2020-09-23 MED ORDER — POTASSIUM CHLORIDE CRYS ER 20 MEQ PO TBCR
40.0000 meq | EXTENDED_RELEASE_TABLET | Freq: Two times a day (BID) | ORAL | Status: DC
  Administered 2020-09-23 – 2020-09-27 (×8): 40 meq via ORAL
  Filled 2020-09-23 (×8): qty 2

## 2020-09-23 MED ORDER — POTASSIUM CHLORIDE CRYS ER 20 MEQ PO TBCR
40.0000 meq | EXTENDED_RELEASE_TABLET | Freq: Every day | ORAL | Status: DC
  Administered 2020-09-23: 40 meq via ORAL
  Filled 2020-09-23: qty 2

## 2020-09-23 NOTE — Evaluation (Signed)
Occupational Therapy Evaluation Patient Details Name: Kevin Long MRN: [id number] DOB: [birth year] Today's Date: 09/23/2020    History of Present Illness 60 y.o. male admitted on 09/21/20 foracute on chronic CHF exacerbation, anasarca, elevated total bili, INR, likely due to volume overload, soft BPs on admission.  Pt with significant PMH of fall, PE DM2, essential HTN, morbid obesity, degeneration of the lumbosacral intervertebral disc. gout, OSA, PVCs, vertigo, and foot surgery.   Clinical Impression   Pt admitted for concerns listed above. PTA pt reported that he is independent with functional mobility at base line, using a cane and a walker. Pt requires assist most of the time with LB dressing and bathing due to edema making it difficult for him to reach his feet/lower legs. At the time of the evaluation, pt presents with decreased activity tolerance and SOB with all functional mobility. Pt able to complete transfers and mobility with supervision using a RW, all UB ADL's are independent and LB ADL's pt requires min-mod A. Acute OT will continue to follow pt to address AE to improve independence, as well as provide resources for pt's increased edema.     Follow Up Recommendations  Home health OT;Supervision - Intermittent    Equipment Recommendations  Tub/shower bench    Recommendations for Other Services       Precautions / Restrictions Precautions Precautions: Fall Precaution Comments: h/o fall Restrictions Weight Bearing Restrictions: No      Mobility Bed Mobility Overal bed mobility: Modified Independent             General bed mobility comments: Pt completed sup<>sit with increased time and HOB elevated.    Transfers Overall transfer level: Needs assistance Equipment used: Rolling walker (2 wheeled) Transfers: Sit to/from Stand Sit to Stand: Min guard;Supervision         General transfer comment: Min guard for safety with cane, sup for safety with RW     Balance Overall balance assessment: Needs assistance Sitting-balance support: Feet supported;Bilateral upper extremity supported Sitting balance-Leahy Scale: Good     Standing balance support: Bilateral upper extremity supported;No upper extremity supported Standing balance-Leahy Scale: Fair                             ADL either performed or assessed with clinical judgement   ADL Overall ADL's : Needs assistance/impaired Eating/Feeding: Independent;Sitting   Grooming: Wash/dry hands;Oral care;Supervision/safety;Standing Grooming Details (indicate cue type and reason): completed at sink Upper Body Bathing: Independent;Sitting   Lower Body Bathing: Minimal assistance;Sitting/lateral leans;Sit to/from stand Lower Body Bathing Details (indicate cue type and reason): difficulty reaching feet and bottom Upper Body Dressing : Independent;Sitting   Lower Body Dressing: Moderate assistance;Sitting/lateral leans;Sit to/from stand Lower Body Dressing Details (indicate cue type and reason): pt unable to reach past his knees to don/doff socks, underwear, pants Toilet Transfer: Supervision/safety;Ambulation Toilet Transfer Details (indicate cue type and reason): completed on low toilet in bathroom. Toileting- Clothing Manipulation and Hygiene: Minimal assistance;Sitting/lateral lean;Sit to/from stand Toileting - Clothing Manipulation Details (indicate cue type and reason): unable to complete thorough pericare Tub/ Shower Transfer: Supervision/safety;Ambulation   Functional mobility during ADLs: Supervision/safety;Min guard;Rolling walker;Cane General ADL Comments: Pt overall steady with balance, especially with use of RW, when using cane, pt is more at a min guard level at this time. UB ADL's pt is independent, LB ADL's pt needs assist at baseline typically due to edema.     Vision Baseline Vision/History: No visual deficits Patient  Visual Report: No change from baseline Vision  Assessment?: No apparent visual deficits     Perception Perception Perception Tested?: No   Praxis Praxis Praxis tested?: Not tested    Pertinent Vitals/Pain Pain Assessment: No/denies pain     Hand Dominance Right   Extremity/Trunk Assessment Upper Extremity Assessment Upper Extremity Assessment: Generalized weakness   Lower Extremity Assessment Lower Extremity Assessment: Defer to PT evaluation   Cervical / Trunk Assessment Cervical / Trunk Assessment: Other exceptions Cervical / Trunk Exceptions: h/o degenerative disc in lumbar spine   Communication Communication Communication: No difficulties   Cognition Arousal/Alertness: Awake/alert Behavior During Therapy: WFL for tasks assessed/performed Overall Cognitive Status: Within Functional Limits for tasks assessed                                     General Comments  Pt experiencing scrotal edema, making ambulation and transfers more painful. OT educated on scrotal sling and will bring by worksheet on where to order one.    Exercises     Shoulder Instructions      Home Living Family/patient expects to be discharged to:: Private residence Living Arrangements: Spouse/significant other;Children Available Help at Discharge: Family Type of Home: House Home Access: Stairs to enter [employer] of Steps: 2 steps Entrance Stairs-Rails: None Home Layout: Multi-level;Bed/bath upstairs Alternate Level Stairs-Number of Steps: flight Alternate Level Stairs-Rails: Right Bathroom Shower/Tub: Chief Strategy Officer: Standard Bathroom Accessibility: Yes How Accessible: Accessible via walker Home Equipment: Walker - 2 wheels;Cane - single point          Prior Functioning/Environment Level of Independence: Independent        Comments: Pt mainly uses a cane in his home, but when he feels more weak he uses a RW. He also will borrow a WC from a friend if he knows he is going somewhere  he has to walk a long ways at. Pt does not work or drive.        OT Problem List: Decreased strength;Decreased activity tolerance;Impaired balance (sitting and/or standing);Decreased safety awareness;Decreased knowledge of use of DME or AE;Pain;Increased edema;Obesity      OT Treatment/Interventions: Self-care/ADL training;Therapeutic exercise;Energy conservation;DME and/or AE instruction;Therapeutic activities;Patient/family education;Balance training    OT Goals(Current goals can be found in the care plan section) Acute Rehab OT Goals Patient Stated Goal: to get his swelling down OT Goal Formulation: With patient/family Time For Goal Achievement: 10/07/20 Potential to Achieve Goals: Good ADL Goals Pt Will Perform Lower Body Bathing: with modified independence;with adaptive equipment;sitting/lateral leans;sit to/from stand Pt Will Perform Lower Body Dressing: with modified independence;with adaptive equipment;sitting/lateral leans;sit to/from stand Pt/caregiver will Perform Home Exercise Program: Increased strength;Both right and left upper extremity;With written HEP provided Additional ADL Goal #1: Pt will verbalize 3 fall prevention techniques.  OT Frequency: Min 2X/week   Barriers to D/C:            Co-evaluation              AM-PAC OT "6 Clicks" Daily Activity     Outcome Measure Help from another person eating meals?: None Help from another person taking care of personal grooming?: A Little Help from another person toileting, which includes using toliet, bedpan, or urinal?: A Little Help from another person bathing (including washing, rinsing, drying)?: A Lot Help from another person to put on and taking off regular upper body clothing?: None Help from another person  to put on and taking off regular lower body clothing?: A Lot 6 Click Score: 18   End of Session Equipment Utilized During Treatment: Engineer, water Communication: Mobility status  Activity  Tolerance: Patient tolerated treatment well Patient left: in chair;with call bell/phone within reach;with nursing/sitter in room  OT Visit Diagnosis: Unsteadiness on feet (R26.81);Other abnormalities of gait and mobility (R26.89);Muscle weakness (generalized) (M62.81)                Time: 9373-4287 OT Time Calculation (min): 31 min Charges:  OT General Charges $OT Visit: 1 Visit OT Evaluation $OT Eval Moderate Complexity: 1 Mod OT Treatments $Self Care/Home Management : 8-22 mins  Furious Chiarelli H., OTR/L Acute Rehabilitation  Narcisa Ganesh Elane Kelsy Polack 09/23/2020, 10:29 AM

## 2020-09-23 NOTE — Progress Notes (Signed)
PROGRESS NOTE    Kevin Long   CWC:[id number]  DOB: [birth year]  PCP: Fleet Contras, MD    DOA: 09/21/2020 LOS: 2   Assessment & Plan   Principal Problem:   Acute exacerbation of CHF (congestive heart failure) (HCC) Active Problems:   Hyperlipidemia   OBSTRUCTIVE SLEEP APNEA   Essential hypertension   Uncontrolled type 2 diabetes mellitus with hyperglycemia, with long-term current use of insulin (HCC)   Anasarca   Pulmonary embolism associated with COVID-19 (HCC)   Acute on chronic systolic CHF -echo in May 2021 had EF less than 20% global LV hypokinesis, severely reduced RV systolic function.  This admission similar, EF<20%. Presented with BNP 1031 with associated anasarca, weight gain, dyspnea and orthopnea.  History of poor compliance with medications at home, patient reports taking Lasix as prescribed and had increased dose without improvement. --Cardiology consulted --Increased diuretic: 40 >> 80 mg IV Lasix BID --CHF team consulted, awaiting return page --I/O's and daily weights to monitor volume status --Monitor renal function and electrolytes --?Allergy to [facility] / pt stopped on his own  Anasarca -present on admission, due to decompensated CHF and severely reduced EF.   Improving with Lasix.   Management as above  Elevated total bili, INR -with normal LFTs.  Suspect due to liver congestion with volume overload.  Liver ultrasound in May 2021 showed normal liver parenchymal echogenicity. -- Follow LFTs --RUQ u/s pending  Essential hypertension -BP is soft on admission, monitor closely. --Coreg dose reduced to tolerate high dose diuretic --Per cardiology  Uncontrolled type 2 diabetes with hyperglycemia with long-term use of insulin -sliding scale NovoLog with no bedtime coverage as patient endorses hypoglycemic episodes.  Would benefit from SLGT2i, consider.  History of pulmonary embolism associated with COVID-19 -in May 2021.  Completed treatment with Eliquis  for provoked PE.  Hyperlipidemia -follow-up lipid panel.  Crestor allergy noted.  Obstructive sleep apnea -does not use CPAP at home.   Nasal cannula oxygen qHS.  General debility /mechanical fall /generalized weakness -  Follow-up PT evaluation and recommendations for dispo planning  Morbid obesity: Body mass index is 44.22 kg/m.  Complicates overall care and prognosis.  Recommend lifestyle modifications including physical activity and diet for weight loss and overall long-term health.   DVT prophylaxis:    Diet:  Diet Orders (From admission, onward)     Start     Ordered   09/21/20 1804  Diet heart healthy/carb modified Room service appropriate? Yes; Fluid consistency: Thin  Diet effective now       Question Answer Comment  Diet-HS Snack? Nothing   Room service appropriate? Yes   Fluid consistency: Thin      09/21/20 1805              Code Status: Full Code   Brief Narrative / Hospital Course to Date:    Kevin Long is a 60 y.o. male with medical history significant for PVC, nonischemic cardiomyopathy, systolic CHF with EF of 20-25%, OSA, DM2, HTN, BPPV, PE secondary to covid in 2021,  obesity who was seen at his cardiologist office today (09/21/20) and found to have excess fluid overload in acute on chronic CHF and was referred to the hospital for IV diuresis and further evaluation.    Subjective 09/23/20    Patient sitting up edge of bed having lunch. Wife at bedside. Pt reports some improved leg swelling.  Testicles remain swollen and he reports feeling swollen in his right upper abdomen.  No other acute complaints.  Denies CP or SOB at rest.   Disposition Plan & Communication   Status is: Inpatient  Remains inpatient appropriate because:IV treatments appropriate due to intensity of illness or inability to take PO  Dispo: The patient is from: Home              Anticipated d/c is to: Home              Patient currently is not medically stable to d/c.    Difficult to place patient No      Consults, Procedures, Significant Events   Consultants:  Heart failure team  Procedures:  None  Antimicrobials:  Anti-infectives (From admission, onward)    Start     Dose/Rate Route Frequency Ordered Stop   09/21/20 2200  cephALEXin (KEFLEX) capsule 500 mg  Status:  Discontinued        500 mg Oral 2 times daily 09/21/20 1815 09/21/20 1839         Micro    Objective   Vitals:   09/22/20 2246 09/23/20 0045 09/23/20 0441 09/23/20 1314  BP:  (!) 114/59 108/65   Pulse:  74 94 65  Resp: 19 16 20    Temp:  98.5 F (36.9 C) 98.6 F (37 C) (!) 97 F (36.1 C)  TempSrc:  Oral Oral Axillary  SpO2:  98% 99%   Weight:   124.3 kg   Height:        Intake/Output Summary (Last 24 hours) at 09/23/2020 1439 Last data filed at 09/23/2020 1314 Gross per 24 hour  Intake 607.06 ml  Output 850 ml  Net -242.94 ml   Filed Weights   09/21/20 1800 09/22/20 0439 09/23/20 0441  Weight: 123.6 kg 123.9 kg 124.3 kg    Physical Exam:  General exam: awake, alert, no acute distress, morbid obesity Respiratory system: CTAB, normal respiratory effort, on room air. Cardiovascular system: normal S1/S2, RRR, tense bilateral lower extremity mildly improved, persistent trunk/abdominal and scrotal edema.   Gastrointestinal system: soft, NT. Central nervous system: no gross focal neurologic deficits, normal speech Extremities: moves all, no edema, normal tone Psychiatry: normal mood, congruent affect, judgement and insight appear normal  Labs   Data Reviewed: I have personally reviewed following labs and imaging studies  CBC: Recent Labs  Lab 09/17/20 2040 09/21/20 1117 09/23/20 0306  WBC 7.9 8.1 10.3  NEUTROABS 5.6 5.8  --   HGB 11.2* 11.4* 11.1*  HCT 34.3* 34.5* 33.4*  MCV 89.3 88.9 88.4  PLT 315 331 316   Basic Metabolic Panel: Recent Labs  Lab 09/17/20 2040 09/21/20 1117 09/22/20 0213 09/22/20 1935 09/23/20 0306  NA 138 137 135 137 138   K 3.7 3.6 2.8* 3.4* 3.8  CL 99 100 99 101 102  CO2 28 27 26 29 27   GLUCOSE 161* 127* 129* 120* 111*  BUN 13 11 9 9 9   CREATININE 1.24 1.04 1.11 1.12 1.18  CALCIUM 9.7 9.2 9.0 8.9 9.0  MG  --   --  1.8 1.8 1.9   GFR: Estimated Creatinine Clearance: 81.8 mL/min (by C-G formula based on SCr of 1.18 mg/dL). Liver Function Tests: Recent Labs  Lab 09/21/20 1117 09/22/20 0213 09/23/20 0306  AST 24 24 24   ALT 13 13 12   ALKPHOS 121 107 110  BILITOT 3.4* 2.8* 3.5*  PROT 7.6 6.7 6.9  ALBUMIN 4.0 3.5 3.7   Recent Labs  Lab 09/21/20 1117  LIPASE 53*   No results for input(s): AMMONIA in the last 168 hours.  Coagulation Profile: Recent Labs  Lab 09/21/20 1117  INR 1.5*   Cardiac Enzymes: No results for input(s): CKTOTAL, CKMB, CKMBINDEX, TROPONINI in the last 168 hours. BNP (last 3 results) No results for input(s): PROBNP in the last 8760 hours. HbA1C: Recent Labs    09/21/20 1904  HGBA1C 7.1*   CBG: Recent Labs  Lab 09/22/20 1642 09/22/20 2107 09/23/20 0719 09/23/20 0720 09/23/20 1053  GLUCAP 77 132* 189* 188* 117*   Lipid Profile: No results for input(s): CHOL, HDL, LDLCALC, TRIG, CHOLHDL, LDLDIRECT in the last 72 hours. Thyroid Function Tests: No results for input(s): TSH, T4TOTAL, FREET4, T3FREE, THYROIDAB in the last 72 hours. Anemia Panel: No results for input(s): VITAMINB12, FOLATE, FERRITIN, TIBC, IRON, RETICCTPCT in the last 72 hours. Sepsis Labs: No results for input(s): PROCALCITON, LATICACIDVEN in the last 168 hours.  Recent Results (from the past 240 hour(s))  Urine culture     Status: Abnormal   Collection Time: 09/17/20  8:41 PM   Specimen: Urine, Random  Result Value Ref Range Status   Specimen Description   Final    URINE, RANDOM Performed at [facility], [address] [place], [place] [address]    Special Requests   Final    NONE Performed at [facility], [address], [place], [place]  [address]    Culture (A)  Final    <10,000 COLONIES/mL INSIGNIFICANT GROWTH Performed at [facility], [address], [place], [place] [address]    Report Status 09/19/2020 FINAL  Final  Resp Panel by RT-PCR (Flu A&B, Covid) Nasopharyngeal Swab     Status: None   Collection Time: 09/21/20 11:17 AM   Specimen: Nasopharyngeal Swab; Nasopharyngeal(NP) swabs in vial transport medium  Result Value Ref Range Status   SARS Coronavirus 2 by RT PCR NEGATIVE NEGATIVE Final    Comment: (NOTE) SARS-CoV-2 target nucleic acids are NOT DETECTED.  The SARS-CoV-2 RNA is generally detectable in upper respiratory specimens during the acute phase of infection. The lowest concentration of SARS-CoV-2 viral copies this assay can detect is 138 copies/mL. A negative result does not preclude SARS-Cov-2 infection and should not be used as the sole basis for treatment or other patient management decisions. A negative result may occur with  improper specimen collection/handling, submission of specimen other than nasopharyngeal swab, presence of viral mutation(s) within the areas targeted by this assay, and inadequate number of viral copies(<138 copies/mL). A negative result must be combined with clinical observations, patient history, and epidemiological information. The expected result is Negative.  Fact Sheet for Patients:  BloggerCourse.com  Fact Sheet for Healthcare Providers:  SeriousBroker.it  This test is no t yet approved or cleared by the [place] FDA and  has been authorized for detection and/or diagnosis of SARS-CoV-2 by FDA under an Emergency Use Authorization (EUA). This EUA will remain  in effect (meaning this test can be used) for the duration of the COVID-19 declaration under Section 564(b)(1) of the Act, 21 U.S.C.section 360bbb-3(b)(1), unless the authorization is terminated  or revoked sooner.       Influenza A by PCR NEGATIVE  NEGATIVE Final   Influenza B by PCR NEGATIVE NEGATIVE Final    Comment: (NOTE) The Xpert Xpress SARS-CoV-2/FLU/RSV plus assay is intended as an aid in the diagnosis of influenza from Nasopharyngeal swab specimens and should not be used as a sole basis for treatment. Nasal washings and aspirates are unacceptable for Xpert Xpress SARS-CoV-2/FLU/RSV testing.  Fact Sheet for Patients:  BloggerCourse.com  Fact Sheet for Healthcare Providers: SeriousBroker.it  This test is not yet approved or cleared by the [place] FDA and has been authorized for detection and/or diagnosis of SARS-CoV-2 by FDA under an Emergency Use Authorization (EUA). This EUA will remain in effect (meaning this test can be used) for the duration of the COVID-19 declaration under Section 564(b)(1) of the Act, 21 U.S.C. section 360bbb-3(b)(1), unless the authorization is terminated or revoked.  Performed at [facility], [address] [place], [place] [address]       Imaging Studies   ECHOCARDIOGRAM COMPLETE  Result Date: 09/22/2020    ECHOCARDIOGRAM REPORT   Patient Name:   Kevin Long Date of Exam: 09/22/2020 Medical Rec #:  [id number]     Height:       66.0 in Accession #:    [id number]    Weight:       273.2 lb Date of Birth:  1960-08-10     BSA:          2.283 m Patient Age:    59 years      BP:           120/82 mmHg Patient Gender: M             HR:           74 bpm. Exam Location:  Inpatient Procedure: 2D Echo, Cardiac Doppler, Color Doppler and Intracardiac            Opacification Agent Indications:    I50.21 Acute systolic (congestive) heart failure  History:        Patient has prior history of Echocardiogram examinations, most                 recent 08/12/2019. Risk Factors:Diabetes, Hypertension,                 Dyslipidemia and Former Smoker.  Sonographer:    Roosvelt Maser RDCS Referring Phys: [id number] ALLISON WOLFE IMPRESSIONS  1. Left  ventricular ejection fraction, by estimation, is <20%. The left ventricle has severely decreased function. The left ventricle demonstrates global hypokinesis. The left ventricular internal cavity size was moderately dilated. Left ventricular diastolic parameters are consistent with Grade II diastolic dysfunction (pseudonormalization). Elevated left atrial pressure. There is the interventricular septum is flattened in diastole ('D' shaped left ventricle), consistent with right ventricular volume overload.  2. Right ventricular systolic function is moderately reduced. The right ventricular size is moderately enlarged.  3. Left atrial size was mildly dilated.  4. Right atrial size was mildly dilated.  5. The mitral valve is normal in structure. Trivial mitral valve regurgitation.  6. The tricuspid valve is abnormal. Tricuspid valve regurgitation is severe.  7. The aortic valve is tricuspid. Aortic valve regurgitation is not visualized. No aortic stenosis is present. FINDINGS  Left Ventricle: Left ventricular ejection fraction, by estimation, is <20%. The left ventricle has severely decreased function. The left ventricle demonstrates global hypokinesis. The left ventricular internal cavity size was moderately dilated. There is no left ventricular hypertrophy. The interventricular septum is flattened in diastole ('D' shaped left ventricle), consistent with right ventricular volume overload. Left ventricular diastolic parameters are consistent with Grade II diastolic dysfunction (pseudonormalization). Elevated left atrial pressure. Right Ventricle: The right ventricular size is moderately enlarged. Right vetricular wall thickness was not well visualized. Right ventricular systolic function is moderately reduced. Left Atrium: Left atrial size was mildly dilated. Right Atrium: Right atrial size was mildly dilated. Pericardium: Trivial  pericardial effusion is present. Mitral Valve: The mitral valve is normal in structure.  Trivial mitral valve regurgitation. Tricuspid Valve: The tricuspid valve is abnormal. Tricuspid valve regurgitation is severe. Aortic Valve: The aortic valve is tricuspid. Aortic valve regurgitation is not visualized. No aortic stenosis is present. Aortic valve mean gradient measures 3.0 mmHg. Aortic valve peak gradient measures 5.7 mmHg. Aortic valve area, by VTI measures 1.07 cm. Pulmonic Valve: The pulmonic valve was not well visualized. Pulmonic valve regurgitation is trivial. Aorta: The aortic root is normal in size and structure. IAS/Shunts: The interatrial septum was not well visualized.  LEFT VENTRICLE PLAX 2D LVIDd:         6.10 cm  Diastology LVIDs:         5.60 cm  LV e' lateral:   5.22 cm/s LV PW:         0.90 cm  LV E/e' lateral: 15.1 LV IVS:        0.80 cm LVOT diam:     1.80 cm LV SV:         27 LV SV Index:   12 LVOT Area:     2.54 cm  RIGHT VENTRICLE RV Basal diam:  5.50 cm RV Mid diam:    5.00 cm LEFT ATRIUM             Index       RIGHT ATRIUM           Index LA diam:        4.30 cm 1.88 cm/m  RA Area:     21.60 cm LA Vol (A2C):   95.2 ml 41.69 ml/m RA Volume:   75.70 ml  33.15 ml/m LA Vol (A4C):   61.5 ml 26.94 ml/m LA Biplane Vol: 81.0 ml 35.48 ml/m  AORTIC VALVE AV Area (Vmax):    1.13 cm AV Area (Vmean):   1.01 cm AV Area (VTI):     1.07 cm AV Vmax:           119.00 cm/s AV Vmean:          82.100 cm/s AV VTI:            0.254 m AV Peak Grad:      5.7 mmHg AV Mean Grad:      3.0 mmHg LVOT Vmax:         52.90 cm/s LVOT Vmean:        32.500 cm/s LVOT VTI:          0.107 m LVOT/AV VTI ratio: 0.42  AORTA Ao Root diam: 3.30 cm MITRAL VALVE               TRICUSPID VALVE MV Area (PHT): 5.38 cm    TR Peak grad:   22.5 mmHg MV Decel Time: 141 msec    TR Vmax:        237.00 cm/s MV E velocity: 78.60 cm/s MV A velocity: 73.90 cm/s  SHUNTS MV E/A ratio:  1.06        Systemic VTI:  0.11 m                            Systemic Diam: 1.80 cm Epifanio Lesches MD Electronically signed by  Epifanio Lesches MD Signature Date/Time: 09/22/2020/2:42:25 PM    Final      Medications   Scheduled Meds:  aspirin EC  81 mg Oral Daily   carvedilol  6.25 mg Oral BID WC  enoxaparin (LOVENOX) injection  60 mg Subcutaneous Q24H   furosemide  80 mg Intravenous Q12H   insulin aspart  0-20 Units Subcutaneous TID WC   potassium chloride  40 mEq Oral BID   sodium chloride flush  3 mL Intravenous Q12H   spironolactone  12.5 mg Oral Daily   [START ON 09/24/2020] Vitamin D (Ergocalciferol)  50,000 Units Oral Q Mon   Continuous Infusions:  sodium chloride         LOS: 2 days    Time spent: 30 minutes    Pennie BanterKelly A Ardythe Klute, DO [facility]  09/23/2020, 2:39 PM      If 7PM-7AM, please contact night-coverage. How to contact the [facility] Attending or Consulting provider 7A - 7P or covering provider during after hours 7P -7A, for this patient?    Check the care team in [facility] and look for a) attending/consulting [facility] provider listed and b) the [facility] team listed Log into [email or web address] and use [place]'s universal password to access. If you do not have the password, please contact the hospital operator. Locate the [facility] provider you are looking for under [facility] and page to a number that you can be directly reached. If you still have difficulty reaching the provider, please page the [facility] (Director on Call) for the Hospitalists listed on amion for assistance.

## 2020-09-23 NOTE — Consult Note (Addendum)
Cardiology Consultation:   Patient ID: Kevin Long MRN: [id number]; DOB: July 28, 1960  Admit date: 09/21/2020 Date of Consult: 09/23/2020  PCP:  Kevin Long, Edwin, MD   [facility] Providers Cardiologist:  None        Patient Profile:   Kevin Long is a 60 y.o. male with a hx of (HFrEF) heart failure with reduced ejection fraction, non-ischemic cardiomyopathy, hypertension, diabetes mellitus, hyperlipidemia, OSA, obesity who is being seen 09/23/2020 for the evaluation of congestive heart failure at the request of Kevin Long.  History of Present Illness:   Mr. Kevin Long was previously followed in [facility] and was last seen by Dr. Shirlee Long in 2018.  He has a hx of non-ischemic cardiomyopathy, (HFrEF) heart failure with reduced ejection fraction, normal coronary arteries on cardiac catheterization in 2014.  EF has been 25 in the past and cMRI could not be done due to pt size.  He has stoppee taking Entresto and Spironolactone in the past (pt felt they caused vertigo).  He also had intol to BiDil.  He was to be referred to EP for ICD at last visit with Dr. Shirlee Long but has not been seen by our service since.  He did see Kevin Long with [facility] in 3/21.  At that time, he had stopped all medications.  He was prescribed BiDil and a f/u echocardiogram showed EF 20.  He has not had f/u with Kevin Long since.  [facility] on 09/21/20. He was noted to be volume overloaded and admission for IV diuresis was recommended.   He was seen in the ED at [facility] and [facility].  Since admit, he is neg 1,392 mL.  Weight has not changed much (272.4 >> 274 lbs) - question if weight accurate.  Current meds: ASA, Coreg 12.5 twice daily, Lovenox 60 Q 24, Lasix 40 IV twice daily, K+ 40 once daily   The patient notes that he has been having increased lower extremity edema over the past 3 to 4 months.  He increased  furosemide to twice a day on his own with some improvement.  However, he developed epistaxis of decided to reduce this back to once a day.  He describes seeing a urologist to have a Foley catheter placed due to difficulty with urination.  He has had scrotal and penile edema which likely contributed to his difficulty in emptying his bladder.  He has also had some difficulty with constipation and did develop recent hematochezia with straining.  He has not had chest pain.  He sleeps on 2 pillows.  He has been getting short of breath with minimal activity.  He has not had syncope or palpitations.  Past Medical History:  Diagnosis Date   Abnormal LFTs    Asthma    Bacteremia 09/19/2020   Degeneration of lumbosacral intervertebral disc 09/19/2020   Diabetes mellitus, type 2 (HCC)    Essential hypertension 02/09/2007   Qualifier: Diagnosis of  By: Kevin BarreJones CNA/MA, Kevin Long     Gout 09/19/2020   HFrEF (heart failure with reduced ejection fraction) (HCC) 09/25/2016   Echocardiogram 09/22/20: EF < 20, global HK, Gr 2 DD, elevated LA pressure, D shaped LV c/w RV vol overload, mod reduced RVSF, mild BAE, trivial MR, severe TR   History of COVID-19    Admit in 07/2019 - course c/b pulmonary embolism, pneumonia, R lung mass (?infectious), MSSA bacteremia, a/c CHF, metabolic encephalopathy, AKI  Hx of Pulmonary Embolism 08/12/2019   In setting of COVID-19 pneumonia with post obstructive pneumonia (pt left hosp AMA)   Hypercholesterolemia    Mass of upper lobe of right lung 08/12/2019   during admit for COVID-19 pneumonia   NICM (nonischemic cardiomyopathy) (HCC) 08/13/2015   Normal Cath in 2014   Obesity    OSA (obstructive sleep apnea)    Pain in joint involving pelvic region and thigh 09/19/2020   Pain in joint, lower leg 09/19/2020   Palpitations 08/13/2015   PVC's (premature ventricular contractions) 03/28/2016   Noted on prior monitor but not quantified   Tobacco use disorder 09/19/2020   Vertigo  09/25/2016    Past Surgical History:  Procedure Laterality Date   FOOT SURGERY     has pin and screws   LEFT AND RIGHT HEART CATHETERIZATION WITH CORONARY ANGIOGRAM N/A 08/04/2012   Procedure: LEFT AND RIGHT HEART CATHETERIZATION WITH CORONARY ANGIOGRAM;  Surgeon: Kevin Crafts, MD;  Location: [facility] CATH LAB;  Service: Cardiovascular;  Laterality: N/A;     Home Medications:  Prior to Admission medications   Medication Sig Start Date End Date Taking? Authorizing Provider  acetaminophen (TYLENOL) 500 MG tablet Take 1,000 mg by mouth every 6 (six) hours as needed for headache (pain).    Yes [provider]  albuterol (PROVENTIL) (2.5 MG/3ML) 0.083% nebulizer solution Take 2.5 mg by nebulization every 6 (six) hours as needed for wheezing or shortness of breath.   Yes [provider]  albuterol (VENTOLIN HFA) 108 (90 Base) MCG/ACT inhaler Inhale 2 puffs into the lungs every 6 (six) hours as needed for wheezing or shortness of breath.   Yes [provider]  aspirin EC 81 MG tablet Take 81 mg by mouth daily. Swallow whole.   Yes [provider]  carvedilol (COREG) 12.5 MG tablet Take 12.5 mg by mouth 2 (two) times daily with a meal.   Yes [provider]  cephALEXin (KEFLEX) 500 MG capsule Take 500 mg by mouth 2 (two) times daily.   Yes [provider]  diclofenac Sodium (VOLTAREN) 1 % GEL Apply 1 application topically daily. 08/17/20  Yes [provider]  furosemide (LASIX) 40 MG tablet Take 40 mg by mouth daily.   Yes [provider]  gabapentin (NEURONTIN) 100 MG capsule Take 1 capsule (100 mg total) by mouth 3 (three) times daily. Patient taking differently: Take 100 mg by mouth at bedtime as needed (pain). 08/19/19  Yes Kevin Long, Kevin Lean, MD  insulin aspart (NOVOLOG FLEXPEN) 100 UNIT/ML FlexPen 0-20 Units, Subcutaneous, 3 times daily with meals CBG < 70: Implement Hypoglycemia measures CBG 70 - 120: 0 units CBG 121 - 150: 3  units CBG 151 - 200: 4 units CBG 201 - 250: 7 units CBG 251 - 300: 11 units CBG 301 - 350: 15 units CBG 351 - 400: 20 units CBG > 400: call MD Patient taking differently: Inject 3 Units into the skin 2 (two) times daily as needed for high blood sugar (CBG >200). 08/19/19  Yes Kevin Long, Kevin Lean, MD  linaclotide Karlene Einstein) 145 MCG CAPS capsule Take 145 mcg by mouth daily as needed (constipation).   Yes [provider]  Multiple Vitamin (MULTIVITAMIN WITH MINERALS) TABS tablet Take 1 tablet by mouth daily.   Yes [provider]  VITAMIN D PO Take 1 tablet by mouth daily.   Yes [provider]  Vitamin D, Ergocalciferol, (DRISDOL) 1.25 MG (50000 UNIT) CAPS capsule Take 50,000 Units by  mouth every Monday. 09/15/20  Yes [provider]    Inpatient Medications: Scheduled Meds:  aspirin EC  81 mg Oral Daily   carvedilol  6.25 mg Oral BID WC   enoxaparin (LOVENOX) injection  60 mg Subcutaneous Q24H   furosemide  80 mg Intravenous Q12H   insulin aspart  0-20 Units Subcutaneous TID WC   potassium chloride  40 mEq Oral BID   sodium chloride flush  3 mL Intravenous Q12H   spironolactone  12.5 mg Oral Daily   [START ON 09/24/2020] Vitamin D (Ergocalciferol)  50,000 Units Oral Q Mon   Continuous Infusions:  sodium chloride     PRN Meds: sodium chloride, acetaminophen, albuterol, gabapentin, linaclotide, sodium chloride flush  Allergies:    Allergies  Allergen Reactions   Celebrex [Celecoxib] Swelling    Swelling in face   Rosuvastatin Hives   Entresto [Sacubitril-Valsartan] Other (See Comments)    Dizziness    Social History:   Social History   Socioeconomic History   Marital status: Single    Spouse name: Not on file   Number of children: Not on file   Years of education: Not on file   Highest education level: Not on file  Occupational History   Not on file  Tobacco Use   Smoking status: Former    Packs/day: 1.00    Years: 25.00    Pack years:  25.00    Types: Cigarettes    Quit date: 03/31/2005    Years since quitting: 15.4   Smokeless tobacco: Never  Vaping Use   Vaping Use: Never used  Substance and Sexual Activity   Alcohol use: No   Drug use: No   Sexual activity: Not on file  Other Topics Concern   Not on file  Social History Narrative   Not on file   Social Determinants of Health   Financial Resource Strain: Not on file  Food Insecurity: Not on file  Transportation Needs: Not on file  Physical Activity: Not on file  Stress: Not on file  Social Connections: Not on file  Intimate Partner Violence: Not on file    Family History:    Family History  Problem Relation Age of Onset   Diabetes Mother    Anxiety disorder Brother    Depression Son    Heart murmur Son    Heart attack Neg Hx    Hypertension Neg Hx    Stroke Neg Hx      ROS:  Please see the history of present illness.  He has not had fever, cough, rash. All other ROS reviewed and negative.     Physical Exam/Data:   Vitals:   09/22/20 2246 09/23/20 0045 09/23/20 0441 09/23/20 1314  BP:  (!) 114/59 108/65   Pulse:  74 94 65  Resp: 19 16 20    Temp:  98.5 F (36.9 C) 98.6 F (37 C) (!) 97 F (36.1 C)  TempSrc:  Oral Oral Axillary  SpO2:  98% 99%   Weight:   124.3 kg   Height:        Intake/Output Summary (Last 24 hours) at 09/23/2020 1406 Last data filed at 09/23/2020 1314 Gross per 24 hour  Intake 607.06 ml  Output 1100 ml  Net -492.94 ml   Last 3 Weights 09/23/2020 09/22/2020 09/21/2020  Weight (lbs) 274 lb 273 lb 3.2 oz 272 lb 6.4 oz  Weight (kg) 124.286 kg 123.923 kg 123.56 kg     Body mass index is 44.22 kg/m.  General:  Well nourished, well developed, in no acute distress HEENT: normal Lymph: no adenopathy Neck: +JVD - up to the angle of his jaw Endocrine:  No thryomegaly Cardiac:  normal S1, S2; RRR; no murmur  Lungs:  clear to auscultation bilaterally, no wheezing, rhonchi or rales  Abd: distended  Ext: 2+ bilateral  leg edema up to his thighs GU: +scrotal edema; + penile edema Musculoskeletal:  No deformities Skin: warm and dry  Neuro:  CNs 2-12 intact, no focal abnormalities noted Psych:  Normal affect   EKG:  The EKG done on 09/21/20 was personally reviewed and demonstrates:  NSR, HR 61, low voltage limb leads, normal axis, non-specific ST-TW changes, QTc 468, QRS 103  Telemetry:  Telemetry was personally reviewed and demonstrates:  sinus rhythm, NSVT 15 beats  Relevant CV Studies: Echocardiogram 09/22/20  1. Left ventricular ejection fraction, by estimation, is <20%. The left  ventricle has severely decreased function. The left ventricle demonstrates  global hypokinesis. The left ventricular internal cavity size was  moderately dilated. Left ventricular  diastolic parameters are consistent with Grade II diastolic dysfunction  (pseudonormalization). Elevated left atrial pressure. There is the  interventricular septum is flattened in diastole ('D' shaped left  ventricle), consistent with right ventricular  volume overload.   2. Right ventricular systolic function is moderately reduced. The right  ventricular size is moderately enlarged.   3. Left atrial size was mildly dilated.   4. Right atrial size was mildly dilated.   5. The mitral valve is normal in structure. Trivial mitral valve  regurgitation.   6. The tricuspid valve is abnormal. Tricuspid valve regurgitation is  severe.   7. The aortic valve is tricuspid. Aortic valve regurgitation is not  visualized. No aortic stenosis is present.   Laboratory Data:   Chemistry Recent Labs  Lab 09/22/20 0213 09/22/20 1935 09/23/20 0306  NA 135 137 138  K 2.8* 3.4* 3.8  CL 99 101 102  CO2 26 29 27   GLUCOSE 129* 120* 111*  BUN 9 9 9   CREATININE 1.11 1.12 1.18  CALCIUM 9.0 8.9 9.0  GFRNONAA >60 >60 >60  ANIONGAP 10 7 9     Recent Labs  Lab 09/21/20 1117 09/22/20 0213 09/23/20 0306  PROT 7.6 6.7 6.9  ALBUMIN 4.0 3.5 3.7  AST 24 24  24   ALT 13 13 12   ALKPHOS 121 107 110  BILITOT 3.4* 2.8* 3.5*   Hematology Recent Labs  Lab 09/17/20 2040 09/21/20 1117 09/23/20 0306  WBC 7.9 8.1 10.3  RBC 3.84* 3.88* 3.78*  HGB 11.2* 11.4* 11.1*  HCT 34.3* 34.5* 33.4*  MCV 89.3 88.9 88.4  MCH 29.2 29.4 29.4  MCHC 32.7 33.0 33.2  RDW 17.1* 17.2* 17.1*  PLT 315 331 316   BNP Recent Labs  Lab 09/17/20 2040 09/21/20 1117  BNP 851.4* 1,031.1*     Radiology/Studies:  DG Chest Port 1 View  Result Date: 09/21/2020 CLINICAL DATA:  Shortness of breath and swelling. EXAM: PORTABLE CHEST 1 VIEW COMPARISON:  09/17/2020 and older studies. FINDINGS: Stable mild to moderate. Enlargement of the cardiopericardial silhouette, no mediastinal or hilar masses. Clear lungs.  No convincing pleural effusion or pneumothorax. Skeletal structures are grossly intact. IMPRESSION: 1. No acute cardiopulmonary disease.  No evidence pulmonary edema. 2. Stable cardiomegaly. Electronically Signed   By: 09/25/20 M.D.   On: 09/21/2020 11:40   ECHOCARDIOGRAM COMPLETE  Result Date: 09/22/2020    ECHOCARDIOGRAM REPORT   Patient Name:   WETZEL MEESTER Date  of Exam: 09/22/2020 Medical Rec #:  [id number]     Height:       66.0 in Accession #:    [id number]    Weight:       273.2 lb Date of Birth:  March 26, 1961     BSA:          2.283 m Patient Age:    59 years      BP:           120/82 mmHg Patient Gender: M             HR:           74 bpm. Exam Location:  Inpatient Procedure: 2D Echo, Cardiac Doppler, Color Doppler and Intracardiac            Opacification Agent Indications:    I50.21 Acute systolic (congestive) heart failure  History:        Patient has prior history of Echocardiogram examinations, most                 recent 08/12/2019. Risk Factors:Diabetes, Hypertension,                 Dyslipidemia and Former Smoker.  Sonographer:    Roosvelt Maser RDCS Referring Phys: [id number] ALLISON WOLFE IMPRESSIONS  1. Left ventricular ejection fraction, by estimation, is <20%.  The left ventricle has severely decreased function. The left ventricle demonstrates global hypokinesis. The left ventricular internal cavity size was moderately dilated. Left ventricular diastolic parameters are consistent with Grade II diastolic dysfunction (pseudonormalization). Elevated left atrial pressure. There is the interventricular septum is flattened in diastole ('D' shaped left ventricle), consistent with right ventricular volume overload.  2. Right ventricular systolic function is moderately reduced. The right ventricular size is moderately enlarged.  3. Left atrial size was mildly dilated.  4. Right atrial size was mildly dilated.  5. The mitral valve is normal in structure. Trivial mitral valve regurgitation.  6. The tricuspid valve is abnormal. Tricuspid valve regurgitation is severe.  7. The aortic valve is tricuspid. Aortic valve regurgitation is not visualized. No aortic stenosis is present. FINDINGS  Left Ventricle: Left ventricular ejection fraction, by estimation, is <20%. The left ventricle has severely decreased function. The left ventricle demonstrates global hypokinesis. The left ventricular internal cavity size was moderately dilated. There is no left ventricular hypertrophy. The interventricular septum is flattened in diastole ('D' shaped left ventricle), consistent with right ventricular volume overload. Left ventricular diastolic parameters are consistent with Grade II diastolic dysfunction (pseudonormalization). Elevated left atrial pressure. Right Ventricle: The right ventricular size is moderately enlarged. Right vetricular wall thickness was not well visualized. Right ventricular systolic function is moderately reduced. Left Atrium: Left atrial size was mildly dilated. Right Atrium: Right atrial size was mildly dilated. Pericardium: Trivial pericardial effusion is present. Mitral Valve: The mitral valve is normal in structure. Trivial mitral valve regurgitation. Tricuspid Valve: The  tricuspid valve is abnormal. Tricuspid valve regurgitation is severe. Aortic Valve: The aortic valve is tricuspid. Aortic valve regurgitation is not visualized. No aortic stenosis is present. Aortic valve mean gradient measures 3.0 mmHg. Aortic valve peak gradient measures 5.7 mmHg. Aortic valve area, by VTI measures 1.07 cm. Pulmonic Valve: The pulmonic valve was not well visualized. Pulmonic valve regurgitation is trivial. Aorta: The aortic root is normal in size and structure. IAS/Shunts: The interatrial septum was not well visualized.  LEFT VENTRICLE PLAX 2D LVIDd:         6.10 cm  Diastology LVIDs:  5.60 cm  LV e' lateral:   5.22 cm/s LV PW:         0.90 cm  LV E/e' lateral: 15.1 LV IVS:        0.80 cm LVOT diam:     1.80 cm LV SV:         27 LV SV Index:   12 LVOT Area:     2.54 cm  RIGHT VENTRICLE RV Basal diam:  5.50 cm RV Mid diam:    5.00 cm LEFT ATRIUM             Index       RIGHT ATRIUM           Index LA diam:        4.30 cm 1.88 cm/m  RA Area:     21.60 cm LA Vol (A2C):   95.2 ml 41.69 ml/m RA Volume:   75.70 ml  33.15 ml/m LA Vol (A4C):   61.5 ml 26.94 ml/m LA Biplane Vol: 81.0 ml 35.48 ml/m  AORTIC VALVE AV Area (Vmax):    1.13 cm AV Area (Vmean):   1.01 cm AV Area (VTI):     1.07 cm AV Vmax:           119.00 cm/s AV Vmean:          82.100 cm/s AV VTI:            0.254 m AV Peak Grad:      5.7 mmHg AV Mean Grad:      3.0 mmHg LVOT Vmax:         52.90 cm/s LVOT Vmean:        32.500 cm/s LVOT VTI:          0.107 m LVOT/AV VTI ratio: 0.42  AORTA Ao Root diam: 3.30 cm MITRAL VALVE               TRICUSPID VALVE MV Area (PHT): 5.38 cm    TR Peak grad:   22.5 mmHg MV Decel Time: 141 msec    TR Vmax:        237.00 cm/s MV E velocity: 78.60 cm/s MV A velocity: 73.90 cm/s  SHUNTS MV E/A ratio:  1.06        Systemic VTI:  0.11 m                            Systemic Diam: 1.80 cm Epifanio Lesches MD Electronically signed by Epifanio Lesches MD Signature Date/Time: 09/22/2020/2:42:25 PM     Final      Assessment and Plan:   1. Acute on chronic (HFrEF) heart failure with reduced ejection fraction Non-ischemic cardiomyopathy.  NYHA IV.  Pt has biventricular failure with EF < 20 and presents with anasarca.  He has had marginal response to diuresis since admit.  His dry weight is somewhere around 250 lbs.  He has about 20-25 lbs of fluid to lose.  He has had issues with non-adherence in the past and has stopped Entresto, Spironolactone, hydralazine/nitrates.  His BP is somewhat soft right now.  We will decrease his beta-blocker (Carvedilol) to 6.25 mg twice daily.  Increase diuretic dose to 80 mg IV twice daily.  Add Spironolactone 12.5 mg once daily.  Check Lactate level.  Increase K+ to 40 mEq twice daily.  Eventually try to get on Entresto, SGLT2i.     2. Hypertension  He admits to taking Carvedilol at home.  His BP has been s/w soft.  We will reduce his dose to 6.25 mg twice daily. Add spironolactone.   3. NSVT Continue to monitor.  His K+ was low (2.8) and may be the cause.    4. Hypokalemia K+ replaced.  Add spironolactone and increase K+ dose.   Risk Assessment/Risk Scores:        New York Heart Association (NYHA) Functional Class NYHA Class IV      For questions or updates, please contact [facility] Please consult [email or web address] for contact info under    Signed, Tereso Newcomer, PA-C  09/23/2020 2:06 PM   Patient seen and examined.  Agree with above documentation.  Mr. Perin is a 60 year old male with a history of heart failure with reduced ejection fraction, nonischemic cardiomyopathy, diabetes, OSA, obesity, hypertension who we are consulted by Dr. Denton Lank for evaluation of heart failure.  Mr. Voorhis previously followed with Dr. Shirlee Latch in advanced heart failure.  EF 25%, had cardiac catheterization in 2014 which showed normal coronary arteries.  He was last seen by Dr. Sherlie Ban in 2018 and was lost to follow-up.  He recently established care with Dr. Bing Matter  in [facility] on 6/24.  He was noted to be volume overloaded and admission for IV diuresis was recommended.  Reports he has been taking carvedilol 12.5 mg twice daily and Lasix 40 mg daily but was otherwise not taking any cardiac medications.  He has noted increasing shortness of breath and lower extremity swelling over the last several months.  His scrotal swelling had worsened to the point that he was having difficulty urinating and had to have Foley catheter placed as an outpatient.  Most recent vital signs here show BP 108/65, pulse 65, afebrile, SPO2 99% on room air.  Labs notable for creatinine 1.18, potassium 3.8, WBC 10.3, hemoglobin 11.1, platelets 316, BNP 1031.  Chest x-ray showed no pulmonary edema.  EKG showed normal sinus rhythm, rate 61, low voltage, poor R wave progression, nonspecific T wave flattening.  Telemetry shows normal sinus rhythm with rate 60s to 70s, episode of NSVT x15 beats.  Echocardiogram yesterday showed LVEF less than 20%, grade 2 diastolic dysfunction, moderate RV dysfunction, severe TR.  He was started on diuresis with IV Lasix, net -1.7 L on admission.  Reports improvement in symptoms.  On exam, patient is alert and oriented, regular rate and rhythm, 2/6 systolic murmur, lungs CTAB, no LE edema, +JVD.  For his acute on chronic combined systolic and diastolic heart failure, recommend more aggressive diuresis.  Reports his weight is up 20 pounds from baseline.  Suspect this is being driven by right heart failure and severe TR, as his lungs are clear on exam and chest x-ray was unremarkable.  Will increase Lasix to 80 mg twice daily.  He does not appear cold on exam and is mentating appropriately, will check lactate to evaluate for evidence of low output.  He has been taking carvedilol 12.5 mg twice daily as an outpatient and continued this admission.  Recommend decreasing to 6.25 mg twice daily to make room for further GDMT.  Will add spironolactone 12.5 mg daily, as has had  hypokalemia down to K2.8.  Had a 15 beat run of NSVT, suspect related to hypokalemia.  Will maintain K greater than 4, mag greater than 2.  Little Ishikawa, MD

## 2020-09-23 NOTE — Plan of Care (Signed)
  Problem: Cardiac: Goal: Ability to achieve and maintain adequate cardiopulmonary perfusion will improve Outcome: Progressing   

## 2020-09-24 ENCOUNTER — Other Ambulatory Visit (HOSPITAL_COMMUNITY): Payer: Self-pay

## 2020-09-24 LAB — COMPREHENSIVE METABOLIC PANEL
ALT: 10 U/L (ref 0–44)
AST: 28 U/L (ref 15–41)
Albumin: 3.5 g/dL (ref 3.5–5.0)
Alkaline Phosphatase: 114 U/L (ref 38–126)
Anion gap: 9 (ref 5–15)
BUN: 9 mg/dL (ref 8–23)
CO2: 25 mmol/L (ref 22–32)
Calcium: 8.7 mg/dL — ABNORMAL LOW (ref 8.9–10.3)
Chloride: 102 mmol/L (ref 98–111)
Creatinine, Ser: 1.19 mg/dL (ref 0.61–1.24)
GFR, Estimated: >60 mL/min (ref >60)
Glucose, Bld: 155 mg/dL — ABNORMAL HIGH (ref 70–99)
Potassium: 3.8 mmol/L (ref 3.5–5.1)
Sodium: 136 mmol/L (ref 135–145)
Total Bilirubin: 2.8 mg/dL — ABNORMAL HIGH (ref 0.3–1.2)
Total Protein: 6.7 g/dL (ref 6.5–8.1)

## 2020-09-24 LAB — GLUCOSE, CAPILLARY
Glucose-Capillary: 136 mg/dL — ABNORMAL HIGH (ref 70–99)
Glucose-Capillary: 178 mg/dL — ABNORMAL HIGH (ref 70–99)
Glucose-Capillary: 75 mg/dL (ref 70–99)
Glucose-Capillary: 112 mg/dL — ABNORMAL HIGH (ref 70–99)

## 2020-09-24 LAB — LACTIC ACID, PLASMA: Lactic Acid, Venous: 1.4 mmol/L (ref 0.5–1.9)

## 2020-09-24 LAB — MAGNESIUM: Magnesium: 1.8 mg/dL (ref 1.7–2.4)

## 2020-09-24 MED ORDER — FUROSEMIDE 10 MG/ML IJ SOLN
80.0000 mg | Freq: Two times a day (BID) | INTRAMUSCULAR | Status: DC
  Administered 2020-09-24 – 2020-09-26 (×4): 80 mg via INTRAVENOUS
  Filled 2020-09-24 (×5): qty 8

## 2020-09-24 MED ORDER — OXYMETAZOLINE HCL 0.05 % NA SOLN
1.0000 | Freq: Two times a day (BID) | NASAL | Status: AC | PRN
  Filled 2020-09-24: qty 30

## 2020-09-24 NOTE — Progress Notes (Signed)
Progress Note  Patient Name: Kevin Long Date of Encounter: 09/24/2020  [facility] Cardiologist: None   Subjective   Net negative 1.3L yesterday, poor response to IV lasix 40 mg in AM but had good response to IV lasix 80 mg in PM. Lactate has normalized.  Stable renal function (Cr 1.2 > 1.2).  BP stable at 119/75.  Reports dyspnea improved, denies any chest pain.  Inpatient Medications    Scheduled Meds:  aspirin EC  81 mg Oral Daily   enoxaparin (LOVENOX) injection  60 mg Subcutaneous Q24H   furosemide  80 mg Intravenous Q12H   insulin aspart  0-20 Units Subcutaneous TID WC   potassium chloride  40 mEq Oral BID   sodium chloride flush  3 mL Intravenous Q12H   spironolactone  12.5 mg Oral Daily   Vitamin D (Ergocalciferol)  50,000 Units Oral Q Mon   Continuous Infusions:  sodium chloride     PRN Meds: sodium chloride, acetaminophen, albuterol, gabapentin, linaclotide, oxymetazoline, sodium chloride flush   Vital Signs    Vitals:   09/23/20 1314 09/23/20 2015 09/24/20 0424 09/24/20 0625  BP:  109/66 119/75   Pulse: 65 67 71   Resp:  19 19   Temp: (!) 97 F (36.1 C) 98.2 F (36.8 C) 97.8 F (36.6 C)   TempSrc: Axillary Oral Axillary   SpO2:  99% 94%   Weight:    123.7 kg  Height:        Intake/Output Summary (Last 24 hours) at 09/24/2020 0911 Last data filed at 09/24/2020 0804 Gross per 24 hour  Intake 240 ml  Output 1955 ml  Net -1715 ml   Last 3 Weights 09/24/2020 09/23/2020 09/22/2020  Weight (lbs) 272 lb 9.6 oz 274 lb 273 lb 3.2 oz  Weight (kg) 123.651 kg 124.286 kg 123.923 kg      Telemetry    NSR - Personally Reviewed  ECG    No new ECG - Personally Reviewed  Physical Exam   GEN: No acute distress.   Neck: + JVD Cardiac: RRR, 2/6 systolic murmur, distant heart sounds Respiratory: Clear to auscultation bilaterally. GI: Distended MS: Trace edema Neuro:  Nonfocal  Psych: Normal affect   Labs    High Sensitivity Troponin:  No results  for input(s): TROPONINIHS in the last 720 hours.    Chemistry Recent Labs  Lab 09/22/20 0213 09/22/20 1935 09/23/20 0306 09/24/20 0311  NA 135 137 138 136  K 2.8* 3.4* 3.8 3.8  CL 99 101 102 102  CO2 26 29 27 25   GLUCOSE 129* 120* 111* 155*  BUN 9 9 9 9   CREATININE 1.11 1.12 1.18 1.19  CALCIUM 9.0 8.9 9.0 8.7*  PROT 6.7  --  6.9 6.7  ALBUMIN 3.5  --  3.7 3.5  AST 24  --  24 28  ALT 13  --  12 10  ALKPHOS 107  --  110 114  BILITOT 2.8*  --  3.5* 2.8*  GFRNONAA >60 >60 >60 >60  ANIONGAP 10 7 9 9      Hematology Recent Labs  Lab 09/17/20 2040 09/21/20 1117 09/23/20 0306  WBC 7.9 8.1 10.3  RBC 3.84* 3.88* 3.78*  HGB 11.2* 11.4* 11.1*  HCT 34.3* 34.5* 33.4*  MCV 89.3 88.9 88.4  MCH 29.2 29.4 29.4  MCHC 32.7 33.0 33.2  RDW 17.1* 17.2* 17.1*  PLT 315 331 316    BNP Recent Labs  Lab 09/17/20 2040 09/21/20 1117  BNP 851.4* 1,031.1*  DDimer No results for input(s): DDIMER in the last 168 hours.   Radiology    ECHOCARDIOGRAM COMPLETE  Result Date: 09/22/2020    ECHOCARDIOGRAM REPORT   Patient Name:   Kevin Long Date of Exam: 09/22/2020 Medical Rec #:  [id number]     Height:       66.0 in Accession #:    [id number]    Weight:       273.2 lb Date of Birth:  May 10, 1960     BSA:          2.283 m Patient Age:    59 years      BP:           120/82 mmHg Patient Gender: M             HR:           74 bpm. Exam Location:  Inpatient Procedure: 2D Echo, Cardiac Doppler, Color Doppler and Intracardiac            Opacification Agent Indications:    I50.21 Acute systolic (congestive) heart failure  History:        Patient has prior history of Echocardiogram examinations, most                 recent 08/12/2019. Risk Factors:Diabetes, Hypertension,                 Dyslipidemia and Former Smoker.  Sonographer:    Roosvelt Maser RDCS Referring Phys: [id number] ALLISON WOLFE IMPRESSIONS  1. Left ventricular ejection fraction, by estimation, is <20%. The left ventricle has severely decreased  function. The left ventricle demonstrates global hypokinesis. The left ventricular internal cavity size was moderately dilated. Left ventricular diastolic parameters are consistent with Grade II diastolic dysfunction (pseudonormalization). Elevated left atrial pressure. There is the interventricular septum is flattened in diastole ('D' shaped left ventricle), consistent with right ventricular volume overload.  2. Right ventricular systolic function is moderately reduced. The right ventricular size is moderately enlarged.  3. Left atrial size was mildly dilated.  4. Right atrial size was mildly dilated.  5. The mitral valve is normal in structure. Trivial mitral valve regurgitation.  6. The tricuspid valve is abnormal. Tricuspid valve regurgitation is severe.  7. The aortic valve is tricuspid. Aortic valve regurgitation is not visualized. No aortic stenosis is present. FINDINGS  Left Ventricle: Left ventricular ejection fraction, by estimation, is <20%. The left ventricle has severely decreased function. The left ventricle demonstrates global hypokinesis. The left ventricular internal cavity size was moderately dilated. There is no left ventricular hypertrophy. The interventricular septum is flattened in diastole ('D' shaped left ventricle), consistent with right ventricular volume overload. Left ventricular diastolic parameters are consistent with Grade II diastolic dysfunction (pseudonormalization). Elevated left atrial pressure. Right Ventricle: The right ventricular size is moderately enlarged. Right vetricular wall thickness was not well visualized. Right ventricular systolic function is moderately reduced. Left Atrium: Left atrial size was mildly dilated. Right Atrium: Right atrial size was mildly dilated. Pericardium: Trivial pericardial effusion is present. Mitral Valve: The mitral valve is normal in structure. Trivial mitral valve regurgitation. Tricuspid Valve: The tricuspid valve is abnormal. Tricuspid  valve regurgitation is severe. Aortic Valve: The aortic valve is tricuspid. Aortic valve regurgitation is not visualized. No aortic stenosis is present. Aortic valve mean gradient measures 3.0 mmHg. Aortic valve peak gradient measures 5.7 mmHg. Aortic valve area, by VTI measures 1.07 cm. Pulmonic Valve: The pulmonic valve was not well visualized. Pulmonic valve regurgitation is trivial.  Aorta: The aortic root is normal in size and structure. IAS/Shunts: The interatrial septum was not well visualized.  LEFT VENTRICLE PLAX 2D LVIDd:         6.10 cm  Diastology LVIDs:         5.60 cm  LV e' lateral:   5.22 cm/s LV PW:         0.90 cm  LV E/e' lateral: 15.1 LV IVS:        0.80 cm LVOT diam:     1.80 cm LV SV:         27 LV SV Index:   12 LVOT Area:     2.54 cm  RIGHT VENTRICLE RV Basal diam:  5.50 cm RV Mid diam:    5.00 cm LEFT ATRIUM             Index       RIGHT ATRIUM           Index LA diam:        4.30 cm 1.88 cm/m  RA Area:     21.60 cm LA Vol (A2C):   95.2 ml 41.69 ml/m RA Volume:   75.70 ml  33.15 ml/m LA Vol (A4C):   61.5 ml 26.94 ml/m LA Biplane Vol: 81.0 ml 35.48 ml/m  AORTIC VALVE AV Area (Vmax):    1.13 cm AV Area (Vmean):   1.01 cm AV Area (VTI):     1.07 cm AV Vmax:           119.00 cm/s AV Vmean:          82.100 cm/s AV VTI:            0.254 m AV Peak Grad:      5.7 mmHg AV Mean Grad:      3.0 mmHg LVOT Vmax:         52.90 cm/s LVOT Vmean:        32.500 cm/s LVOT VTI:          0.107 m LVOT/AV VTI ratio: 0.42  AORTA Ao Root diam: 3.30 cm MITRAL VALVE               TRICUSPID VALVE MV Area (PHT): 5.38 cm    TR Peak grad:   22.5 mmHg MV Decel Time: 141 msec    TR Vmax:        237.00 cm/s MV E velocity: 78.60 cm/s MV A velocity: 73.90 cm/s  SHUNTS MV E/A ratio:  1.06        Systemic VTI:  0.11 m                            Systemic Diam: 1.80 cm Epifanio Lesches MD Electronically signed by Epifanio Lesches MD Signature Date/Time: 09/22/2020/2:42:25 PM    Final    US Abdomen Limited RUQ  (LIVER/GB)  Result Date: 09/23/2020 CLINICAL DATA:  Elevated bilirubin. EXAM: ULTRASOUND ABDOMEN LIMITED RIGHT UPPER QUADRANT COMPARISON:  None. FINDINGS: Gallbladder: Limited evaluation demonstrates no abnormalities. Common bile duct: Diameter: 4.6 mm Liver: No focal lesion identified. Within normal limits in parenchymal echogenicity. Portal vein is patent on color Doppler imaging with normal direction of blood flow towards the liver. Other: None. IMPRESSION: Limited study due to patient body habitus. No abnormalities are identified. Electronically Signed   By: Gerome Sam III M.D   On: 09/23/2020 20:14    Cardiac Studies   Echo 6/25:  1. Left ventricular ejection  fraction, by estimation, is <20%. The left  ventricle has severely decreased function. The left ventricle demonstrates  global hypokinesis. The left ventricular internal cavity size was  moderately dilated. Left ventricular  diastolic parameters are consistent with Grade II diastolic dysfunction  (pseudonormalization). Elevated left atrial pressure. There is the  interventricular septum is flattened in diastole ('D' shaped left  ventricle), consistent with right ventricular  volume overload.   2. Right ventricular systolic function is moderately reduced. The right  ventricular size is moderately enlarged.   3. Left atrial size was mildly dilated.   4. Right atrial size was mildly dilated.   5. The mitral valve is normal in structure. Trivial mitral valve  regurgitation.   6. The tricuspid valve is abnormal. Tricuspid valve regurgitation is  severe.   7. The aortic valve is tricuspid. Aortic valve regurgitation is not  visualized. No aortic stenosis is present.   Patient Profile     60 y.o. male with a hx of (HFrEF) heart failure with reduced ejection fraction, non-ischemic cardiomyopathy, hypertension, diabetes mellitus, hyperlipidemia, OSA, obesity who is being seen for the evaluation of congestive heart failure    Assessment & Plan    Acute on chronic combined systolic and diastolic heart failure: non-ischemic cardiomyopathy.  NYHA IV.  Pt has biventricular failure with EF < 20 and presents with volume overload.  He has had issues with non-adherence in the past and has stopped Entresto, Spironolactone, hydralazine/nitrates.   -Lactate elevated, has now normalized.  Hold beta blocker for now -Continue diuresis with IV lasix 80 mg BID -Started aldactone 12.5 mg daily -Will fold in GDMT as tolerated  Hypertension: on Carvedilol at home.  Holding for now.  Added aldactone as above.   NSVT: continue to monitor.  None overnight.  His K+ was low (2.8) and may be the cause.  Maintain K.4, Mag>2   For questions or updates, please contact [facility] Please consult [email or web address] for contact info under        Signed, Little Ishikawa, MD  09/24/2020, 9:11 AM

## 2020-09-24 NOTE — Progress Notes (Signed)
[facility] night shift telemetry coverage note.  The nursing staff communicated to me that the patient was having mild epistaxis which seems to have subsided.  He is stated that this has been happening at home.  I have ordered oxymetazoline spray as needed if nosebleed recurs.  Sanda Klein, MD.

## 2020-09-24 NOTE — Plan of Care (Signed)
  Problem: Activity: Goal: Capacity to carry out activities will improve Outcome: Progressing   Problem: Cardiac: Goal: Ability to achieve and maintain adequate cardiopulmonary perfusion will improve Outcome: Progressing   

## 2020-09-24 NOTE — Progress Notes (Signed)
Heart Failure Stewardship Pharmacist Progress Note   PCP: Fleet Contras, MD PCP-Cardiologist: None    HPI:  60 yo M with PMH of HFrEF, NICM, HTN, T2DM, HLD, obesity, and OSA. He presented to the ED on 6/24 with shortness of breath and edema. CXR showed clear lungs and no evidence of pulmonary edema. An ECHO was done on 6/25 and LVEF was <20% with moderately reduced RV systolic function (stable from prior ECHO in 07/2019). Of note, patient has stopped taking Entresto and spironolactone in the past due to vertigo and has intolerance to BiDil (unclear what his reaction was to this).  Current HF Medications: Furosemide 80 mg IV BID Spironolactone 12.5 mg daily  Prior to admission HF Medications: Furosemide 40 mg daily Carvedilol 12.5 mg BID  Pertinent Lab Values: Serum creatinine 1.19, BUN 9, Potassium 3.8, Sodium 136, BNP 1031.1, Magnesium 1.8 Hgb A1c 7.1  Vital Signs: Weight: 272 lbs (admission weight: 272 lbs) Blood pressure: 120/70s  Heart rate: 70s   Medication Assistance / Insurance Benefits Check: Does the patient have prescription insurance?  Yes Type of insurance plan: [place] Medicaid  Outpatient Pharmacy:  Prior to admission outpatient pharmacy: [facility] Is the patient willing to use [facility] TOC pharmacy at discharge? Yes Is the patient willing to transition their outpatient pharmacy to utilize a [facility] outpatient pharmacy?   Pending    Assessment: 1. Acute on chronic systolic CHF (EF <55%), due to NICM. NYHA class IV symptoms. - Continue furosemide 80 mg IV BID. Response has improved with increased dose. Down 2 lbs today. - Holding carvedilol for now - Consider starting losartan once BP allows. Noted intolerance to Entresto with dizziness/vertigo - Continue spironolactone 12.5 mg daily  - Consider starting Farxiga 10 mg daily prior to discharge  Plan: 1) Medication changes recommended at this time: - Continue IV diuresis  2) Patient assistance: - Marcelline Deist requires  prior authorization - if this is added, I can complete the PA prior to discharge - Patient has [place] Medicaid (copays $0-3 each per month) - HF TOC appt made for 7/7  3)  Education  - To be completed prior to discharge  Sharen Hones, PharmD, BCPS Heart Failure Stewardship Pharmacist Phone [phone]

## 2020-09-24 NOTE — Progress Notes (Signed)
Kevin Long  VEH:[id number] DOB: 27-Dec-1960 DOA: 09/21/2020 PCP: Fleet Contras, MD    Brief Narrative:  60 year old with a history of PVCs, systolic CHF with EF 20-25%, DM2, HTN, OSA, PE, and obesity who was sent to the ED after being seen by his Cardiologist and found to be in severe volume overload.  Significant Events:  6/24 admit upon referral from his Cardiologist's office  Consultants:  Cardiology  Code Status: FULL CODE  Antimicrobials:  None  DVT prophylaxis: Lovenox  Subjective: Afebrile.  Vital signs stable.  Saturation 94% on room air.  States he is feeling better overall.  Denies shortness of breath or chest pain.  Remains significantly volume overloaded on physical exam.  Assessment & Plan:  Acute exacerbation of chronic systolic CHF -chronic biventricular heart failure TTE May 2021 with EF <20% and severely reduced RV systolic function -similar findings on TTE this admission -anasarca on admission -diuretic has been titrated -dry weight felt to be around 250 pounds - history of noncompliance having stopped Entresto, Aldactone, and BiDil on his own -counseled patient on extreme importance of diet compliance and medication compliance -advised on need for fluid restriction -counseled that he will need frequent ongoing medical care -seems motivated to comply at this time -continue to titrate diuretic as patient remains significantly volume overloaded clinically  Filed Weights   09/22/20 0439 09/23/20 0441 09/24/20 0625  Weight: 123.9 kg 124.3 kg 123.7 kg    NSVT Likely related to hypokalemia -continue to monitor on telemetry  Hypokalemia Due to diuretic -corrected with supplementation  Elevated total bilirubin Felt to be due to passive hepatic congestion in setting of severe CHF and RV failure  HTN Blood pressure presently well controlled and trending downward with diuresis in the face of significant RV failure -we will need to be able to strike a  balance  Uncontrolled DM2 with hyperglycemia A1c 7.1 -CBG reasonably controlled at the time  Normocytic anemia Check anemia panel in a.m.  History of PE Has completed his prescribed treatment course of Eliquis  HLD  OSA Does not use CPAP at home -uses nocturnal nasal cannula oxygen  Morbid obesity - Body mass index is 44 kg/m.   Family Communication:  Status is: Inpatient  Remains inpatient appropriate because:Inpatient level of care appropriate due to severity of illness  Dispo: The patient is from: Home              Anticipated d/c is to: Home              Patient currently is not medically stable to d/c.   Difficult to place patient No   Objective: Blood pressure 119/75, pulse 71, temperature 97.8 F (36.6 C), temperature source Axillary, resp. rate 19, height 5\' 6"  (1.676 m), weight 123.7 kg, SpO2 94 %.  Intake/Output Summary (Last 24 hours) at 09/24/2020 0852 Last data filed at 09/24/2020 0804 Gross per 24 hour  Intake 240 ml  Output 1955 ml  Net -1715 ml   Filed Weights   09/22/20 0439 09/23/20 0441 09/24/20 0625  Weight: 123.9 kg 124.3 kg 123.7 kg    Examination: General: No acute respiratory distress Lungs: Distant breath sounds in all fields with no wheezing Cardiovascular: Regular rate without appreciable murmur or rub -heart sounds distant Abdomen: Nontender, nondistended, soft, bowel sounds positive, no rebound Extremities: 2+ bilateral lower extremity edema without cyanosis  CBC: Recent Labs  Lab 09/17/20 2040 09/21/20 1117 09/23/20 0306  WBC 7.9 8.1 10.3  NEUTROABS 5.6 5.8  --  HGB 11.2* 11.4* 11.1*  HCT 34.3* 34.5* 33.4*  MCV 89.3 88.9 88.4  PLT 315 331 316   Basic Metabolic Panel: Recent Labs  Lab 09/22/20 1935 09/23/20 0306 09/24/20 0311  NA 137 138 136  K 3.4* 3.8 3.8  CL 101 102 102  CO2 29 27 25   GLUCOSE 120* 111* 155*  BUN 9 9 9   CREATININE 1.12 1.18 1.19  CALCIUM 8.9 9.0 8.7*  MG 1.8 1.9 1.8   GFR: Estimated  Creatinine Clearance: 81 mL/min (by C-G formula based on SCr of 1.19 mg/dL).  Liver Function Tests: Recent Labs  Lab 09/21/20 1117 09/22/20 0213 09/23/20 0306 09/24/20 0311  AST 24 24 24 28   ALT 13 13 12 10   ALKPHOS 121 107 110 114  BILITOT 3.4* 2.8* 3.5* 2.8*  PROT 7.6 6.7 6.9 6.7  ALBUMIN 4.0 3.5 3.7 3.5   Recent Labs  Lab 09/21/20 1117  LIPASE 53*     Coagulation Profile: Recent Labs  Lab 09/21/20 1117  INR 1.5*      HbA1C: Hgb A1c MFr Bld  Date/Time Value Ref Range Status  09/21/2020 07:04 PM 7.1 (H) 4.8 - 5.6 % Final    Comment:    (NOTE) Pre diabetes:          5.7%-6.4%  Diabetes:              >6.4%  Glycemic control for   <7.0% adults with diabetes   08/12/2019 12:12 AM 12.4 (H) 4.8 - 5.6 % Final    Comment:    (NOTE) Pre diabetes:          5.7%-6.4% Diabetes:              >6.4% Glycemic control for   <7.0% adults with diabetes     CBG: Recent Labs  Lab 09/23/20 0720 09/23/20 1053 09/23/20 1607 09/23/20 2113 09/24/20 0803  GLUCAP 188* 117* 162* 123* 136*    Recent Results (from the past 240 hour(s))  Urine culture     Status: Abnormal   Collection Time: 09/17/20  8:41 PM   Specimen: Urine, Random  Result Value Ref Range Status   Specimen Description   Final    URINE, RANDOM Performed at [facility], [address], [place], [place]    Special Requests   Final    NONE Performed at [facility], [address], [place], [place]    Culture (A)  Final    <10,000 COLONIES/mL INSIGNIFICANT GROWTH Performed at [facility], [address], [place], [address] [facility]    Report Status 09/19/2020 FINAL  Final  Resp Panel by RT-PCR (Flu A&B, Covid) Nasopharyngeal Swab     Status: None   Collection Time: 09/21/20 11:17 AM   Specimen: Nasopharyngeal Swab; Nasopharyngeal(NP) swabs in vial transport medium  Result Value Ref Range Status   SARS Coronavirus 2 by RT PCR  NEGATIVE NEGATIVE Final    Comment: (NOTE) SARS-CoV-2 target nucleic acids are NOT DETECTED.  The SARS-CoV-2 RNA is generally detectable in upper respiratory specimens during the acute phase of infection. The lowest concentration of SARS-CoV-2 viral copies this assay can detect is 138 copies/mL. A negative result does not preclude SARS-Cov-2 infection and should not be used as the sole basis for treatment or other patient management decisions. A negative result may occur with  improper specimen collection/handling, submission of specimen other than nasopharyngeal swab, presence of viral mutation(s) within the areas targeted by this  assay, and inadequate number of viral copies(<138 copies/mL). A negative result must be combined with clinical observations, patient history, and epidemiological information. The expected result is Negative.  Fact Sheet for Patients:  BloggerCourse.com  Fact Sheet for Healthcare Providers:  SeriousBroker.it  This test is no t yet approved or cleared by the [place] FDA and  has been authorized for detection and/or diagnosis of SARS-CoV-2 by FDA under an Emergency Use Authorization (EUA). This EUA will remain  in effect (meaning this test can be used) for the duration of the COVID-19 declaration under Section 564(b)(1) of the Act, 21 U.S.C.section 360bbb-3(b)(1), unless the authorization is terminated  or revoked sooner.       Influenza A by PCR NEGATIVE NEGATIVE Final   Influenza B by PCR NEGATIVE NEGATIVE Final    Comment: (NOTE) The Xpert Xpress SARS-CoV-2/FLU/RSV plus assay is intended as an aid in the diagnosis of influenza from Nasopharyngeal swab specimens and should not be used as a sole basis for treatment. Nasal washings and aspirates are unacceptable for Xpert Xpress SARS-CoV-2/FLU/RSV testing.  Fact Sheet for Patients: BloggerCourse.com  Fact Sheet for  Healthcare Providers: SeriousBroker.it  This test is not yet approved or cleared by the [place] FDA and has been authorized for detection and/or diagnosis of SARS-CoV-2 by FDA under an Emergency Use Authorization (EUA). This EUA will remain in effect (meaning this test can be used) for the duration of the COVID-19 declaration under Section 564(b)(1) of the Act, 21 U.S.C. section 360bbb-3(b)(1), unless the authorization is terminated or revoked.  Performed at [facility], [address] [place], [place] [address]      Scheduled Meds:  aspirin EC  81 mg Oral Daily   enoxaparin (LOVENOX) injection  60 mg Subcutaneous Q24H   furosemide  80 mg Intravenous Q12H   insulin aspart  0-20 Units Subcutaneous TID WC   potassium chloride  40 mEq Oral BID   sodium chloride flush  3 mL Intravenous Q12H   spironolactone  12.5 mg Oral Daily   Vitamin D (Ergocalciferol)  50,000 Units Oral Q Mon   Continuous Infusions:  sodium chloride       LOS: 3 days   Lonia Blood, MD [facility] Office  [phone] Pager - Text Page per Loretha Stapler  If 7PM-7AM, please contact night-coverage per Amion 09/24/2020, 8:52 AM

## 2020-09-24 NOTE — Progress Notes (Signed)
Occupational Therapy Treatment Patient Details Name: Kevin Long MRN: [id number] DOB: 1960/08/19 Today's Date: 09/24/2020    History of present illness 60 y.o. male admitted on 09/21/20 foracute on chronic CHF exacerbation, anasarca, elevated total bili, INR, likely due to volume overload, soft BPs on admission.  Pt with significant PMH of fall, PE DM2, essential HTN, morbid obesity, degeneration of the lumbosacral intervertebral disc. gout, OSA, PVCs, vertigo, and foot surgery.   OT comments  Pt making steady progress towards OT goals this session. Session focus on functional mobility as precursor to higher level BADLs and education/ demonstration on all LB AE for bathing and dressing. Pt currently requires min guard asssit for functional mobility greater than a household distance with RW, and supervision for toileting tasks. Pt able to stand at sink for grooming tasks with supervision. Demonstrated all LB AE for bathing and dressing with pt verbalizing understanding. Pt would continue to benefit from skilled occupational therapy while admitted and after d/c to address the below listed limitations in order to improve overall functional mobility and facilitate independence with BADL participation. DC plan remains appropriate, will follow acutely per POC.   pt on RA during session with SpO2 96% and HR max 91 bpm    Follow Up Recommendations  Home health OT;Supervision - Intermittent    Equipment Recommendations  Tub/shower bench    Recommendations for Other Services      Precautions / Restrictions Precautions Precautions: Fall Precaution Comments: h/o fall Restrictions Weight Bearing Restrictions: No       Mobility Bed Mobility Overal bed mobility: Modified Independent             General bed mobility comments: no physical assist needed, use of bed features    Transfers Overall transfer level: Needs assistance Equipment used: Rolling walker (2 wheeled);None Transfers:  Sit to/from Stand Sit to Stand: Min guard;Supervision         General transfer comment: min guard from EOB- supervision from toilet    Balance Overall balance assessment: Needs assistance Sitting-balance support: Feet supported;Bilateral upper extremity supported Sitting balance-Leahy Scale: Good     Standing balance support: No upper extremity supported;During functional activity Standing balance-Leahy Scale: Fair Standing balance comment: able to stand at sink for ADLS with no UE support                           ADL either performed or assessed with clinical judgement   ADL Overall ADL's : Needs assistance/impaired     Grooming: Wash/dry hands;Oral care;Supervision/safety;Standing Grooming Details (indicate cue type and reason): completed at sink       Lower Body Bathing Details (indicate cue type and reason): education and demo provided on LB AE for bathing Upper Body Dressing : Independent;Sitting     Lower Body Dressing Details (indicate cue type and reason): education and demo provided on all LB AE Toilet Transfer: Min IT sales professional Details (indicate cue type and reason): simulated via functional mobility with RW         Functional mobility during ADLs: Min guard;Rolling walker General ADL Comments: pt continues to present with decreased ability to reach feet, impaired activity tolerance and generalized deconditioning, education and LB AE provided during session     Vision       Perception     Praxis      Cognition Arousal/Alertness: Awake/alert Behavior During Therapy: WFL for tasks assessed/performed Overall Cognitive Status: Within Functional Limits for tasks assessed  Exercises     Shoulder Instructions       General Comments pt continues to report scrotal edema, provided demonstration of manufacturing of scrotal sling with pt verbalizing understanding  but declines donning this session as pts son is present for a visit. pt on RA during session with SpO2 96% and HR max 91 bpm    Pertinent Vitals/ Pain       Pain Assessment: No/denies pain  Home Living                                          Prior Functioning/Environment              Frequency  Min 2X/week        Progress Toward Goals  OT Goals(current goals can now be found in the care plan section)  Progress towards OT goals: Progressing toward goals  Acute Rehab OT Goals Patient Stated Goal: none stated Time For Goal Achievement: 10/07/20 Potential to Achieve Goals: Good  Plan Discharge plan remains appropriate;Frequency remains appropriate    Co-evaluation                 AM-PAC OT "6 Clicks" Daily Activity     Outcome Measure   Help from another person eating meals?: None Help from another person taking care of personal grooming?: None Help from another person toileting, which includes using toliet, bedpan, or urinal?: A Little Help from another person bathing (including washing, rinsing, drying)?: None Help from another person to put on and taking off regular upper body clothing?: None Help from another person to put on and taking off regular lower body clothing?: A Lot 6 Click Score: 21    End of Session Equipment Utilized During Treatment: Rolling walker  OT Visit Diagnosis: Unsteadiness on feet (R26.81);Other abnormalities of gait and mobility (R26.89);Muscle weakness (generalized) (M62.81)   Activity Tolerance Patient tolerated treatment well   Patient Left in bed;with call bell/phone within reach;with family/visitor present   Nurse Communication Mobility status        Time: 7425-9563 OT Time Calculation (min): 27 min  Charges: OT General Charges $OT Visit: 1 Visit OT Treatments $Self Care/Home Management : 23-37 mins Lenor Derrick., COTA/L Acute Rehabilitation Services [phone]    Kevin Long 09/24/2020, 4:11 PM

## 2020-09-25 ENCOUNTER — Telehealth: Payer: Self-pay | Admitting: Pulmonary Disease

## 2020-09-25 LAB — RETICULOCYTES
Immature Retic Fract: 15.6 % (ref 2.3–15.9)
RBC.: 3.73 MIL/uL — ABNORMAL LOW (ref 4.22–5.81)
Retic Count, Absolute: 90.3 10*3/uL (ref 19.0–186.0)
Retic Ct Pct: 2.4 % (ref 0.4–3.1)

## 2020-09-25 LAB — CBC
HCT: 32.2 % — ABNORMAL LOW (ref 39.0–52.0)
Hemoglobin: 10.9 g/dL — ABNORMAL LOW (ref 13.0–17.0)
MCH: 29.9 pg (ref 26.0–34.0)
MCHC: 33.9 g/dL (ref 30.0–36.0)
MCV: 88.5 fL (ref 80.0–100.0)
Platelets: 307 10*3/uL (ref 150–400)
RBC: 3.64 MIL/uL — ABNORMAL LOW (ref 4.22–5.81)
RDW: 17.2 % — ABNORMAL HIGH (ref 11.5–15.5)
WBC: 9.1 10*3/uL (ref 4.0–10.5)
nRBC: 0 % (ref 0.0–0.2)

## 2020-09-25 LAB — GLUCOSE, CAPILLARY
Glucose-Capillary: 156 mg/dL — ABNORMAL HIGH (ref 70–99)
Glucose-Capillary: 113 mg/dL — ABNORMAL HIGH (ref 70–99)
Glucose-Capillary: 137 mg/dL — ABNORMAL HIGH (ref 70–99)

## 2020-09-25 LAB — IRON AND TIBC
Iron: 34 ug/dL — ABNORMAL LOW (ref 45–182)
Saturation Ratios: 12 % — ABNORMAL LOW (ref 17.9–39.5)
TIBC: 288 ug/dL (ref 250–450)
UIBC: 254 ug/dL

## 2020-09-25 LAB — COMPREHENSIVE METABOLIC PANEL
ALT: 11 U/L (ref 0–44)
AST: 21 U/L (ref 15–41)
Albumin: 3.5 g/dL (ref 3.5–5.0)
Alkaline Phosphatase: 121 U/L (ref 38–126)
Anion gap: 9 (ref 5–15)
BUN: 9 mg/dL (ref 8–23)
CO2: 29 mmol/L (ref 22–32)
Calcium: 9.2 mg/dL (ref 8.9–10.3)
Chloride: 99 mmol/L (ref 98–111)
Creatinine, Ser: 1.27 mg/dL — ABNORMAL HIGH (ref 0.61–1.24)
GFR, Estimated: >60 mL/min (ref >60)
Glucose, Bld: 125 mg/dL — ABNORMAL HIGH (ref 70–99)
Potassium: 3.7 mmol/L (ref 3.5–5.1)
Sodium: 137 mmol/L (ref 135–145)
Total Bilirubin: 2.4 mg/dL — ABNORMAL HIGH (ref 0.3–1.2)
Total Protein: 6.6 g/dL (ref 6.5–8.1)

## 2020-09-25 LAB — LIPID PANEL
Cholesterol: 157 mg/dL (ref 0–200)
HDL: 38 mg/dL — ABNORMAL LOW (ref >40)
LDL Cholesterol: 106 mg/dL — ABNORMAL HIGH (ref 0–99)
Total CHOL/HDL Ratio: 4.1 RATIO
Triglycerides: 67 mg/dL (ref <150)
VLDL: 13 mg/dL (ref 0–40)

## 2020-09-25 LAB — FERRITIN: Ferritin: 104 ng/mL (ref 24–336)

## 2020-09-25 LAB — MAGNESIUM: Magnesium: 1.8 mg/dL (ref 1.7–2.4)

## 2020-09-25 LAB — VITAMIN B12: Vitamin B-12: 441 pg/mL (ref 180–914)

## 2020-09-25 LAB — FOLATE: Folate: 13.4 ng/mL (ref >5.9)

## 2020-09-25 MED ORDER — MAGNESIUM OXIDE -MG SUPPLEMENT 400 (240 MG) MG PO TABS
400.0000 mg | ORAL_TABLET | Freq: Every day | ORAL | Status: DC
  Administered 2020-09-25: 400 mg via ORAL
  Filled 2020-09-25: qty 1

## 2020-09-25 NOTE — Progress Notes (Signed)
Progress Note  Patient Name: Kevin Long Date of Encounter: 09/25/2020  [facility] Cardiologist: None   Subjective   Net -3.9 L on IV Lasix 80 mg twice daily yesterday, -6.9 L on admission.  Renal function stable (1.2 > 1.2 > 1.3).  Weight down 7 pounds from yesterday.  BP stable at 113/70.  Reports dyspnea improved, denies any chest pain  Inpatient Medications    Scheduled Meds:  aspirin EC  81 mg Oral Daily   enoxaparin (LOVENOX) injection  60 mg Subcutaneous Q24H   furosemide  80 mg Intravenous BID   insulin aspart  0-20 Units Subcutaneous TID WC   magnesium oxide  400 mg Oral Daily   potassium chloride  40 mEq Oral BID   sodium chloride flush  3 mL Intravenous Q12H   spironolactone  12.5 mg Oral Daily   Vitamin D (Ergocalciferol)  50,000 Units Oral Q Mon   Continuous Infusions:  sodium chloride     PRN Meds: sodium chloride, acetaminophen, albuterol, gabapentin, linaclotide, oxymetazoline, sodium chloride flush   Vital Signs    Vitals:   09/24/20 1706 09/24/20 2031 09/25/20 0531 09/25/20 0822  BP: 119/75 119/84 118/74 113/70  Pulse:  68 96 89  Resp: 17 19 15 17   Temp:  98.2 F (36.8 C) 98.7 F (37.1 C) 98.2 F (36.8 C)  TempSrc:  Oral Oral Oral  SpO2:  95% 99% 97%  Weight:   120.7 kg   Height:        Intake/Output Summary (Last 24 hours) at 09/25/2020 0904 Last data filed at 09/25/2020 0600 Gross per 24 hour  Intake 480 ml  Output 4520 ml  Net -4040 ml    Last 3 Weights 09/25/2020 09/24/2020 09/23/2020  Weight (lbs) 266 lb 272 lb 9.6 oz 274 lb  Weight (kg) 120.657 kg 123.651 kg 124.286 kg      Telemetry    NSR - Personally Reviewed  ECG    No new ECG - Personally Reviewed  Physical Exam   GEN: No acute distress.   Neck: + JVD Cardiac: RRR, 2/6 systolic murmur, distant heart sounds Respiratory: Clear to auscultation bilaterally. GI: Distended MS: Trace edema Neuro:  Nonfocal  Psych: Normal affect   Labs    High Sensitivity  Troponin:  No results for input(s): TROPONINIHS in the last 720 hours.    Chemistry Recent Labs  Lab 09/23/20 0306 09/24/20 0311 09/25/20 0216  NA 138 136 137  K 3.8 3.8 3.7  CL 102 102 99  CO2 27 25 29   GLUCOSE 111* 155* 125*  BUN 9 9 9   CREATININE 1.18 1.19 1.27*  CALCIUM 9.0 8.7* 9.2  PROT 6.9 6.7 6.6  ALBUMIN 3.7 3.5 3.5  AST 24 28 21   ALT 12 10 11   ALKPHOS 110 114 121  BILITOT 3.5* 2.8* 2.4*  GFRNONAA >60 >60 >60  ANIONGAP 9 9 9       Hematology Recent Labs  Lab 09/21/20 1117 09/23/20 0306 09/25/20 0216  WBC 8.1 10.3 9.1  RBC 3.88* 3.78* 3.64*  3.73*  HGB 11.4* 11.1* 10.9*  HCT 34.5* 33.4* 32.2*  MCV 88.9 88.4 88.5  MCH 29.4 29.4 29.9  MCHC 33.0 33.2 33.9  RDW 17.2* 17.1* 17.2*  PLT 331 316 307     BNP Recent Labs  Lab 09/21/20 1117  BNP 1,031.1*      DDimer No results for input(s): DDIMER in the last 168 hours.   Radiology    Abdomen Limited RUQ (LIVER/GB)  Result Date: 09/23/2020 CLINICAL DATA:  Elevated bilirubin. EXAM: ULTRASOUND ABDOMEN LIMITED RIGHT UPPER QUADRANT COMPARISON:  None. FINDINGS: Gallbladder: Limited evaluation demonstrates no abnormalities. Common bile duct: Diameter: 4.6 mm Liver: No focal lesion identified. Within normal limits in parenchymal echogenicity. Portal vein is patent on color Doppler imaging with normal direction of blood flow towards the liver. Other: None. IMPRESSION: Limited study due to patient body habitus. No abnormalities are identified. Electronically Signed   By: Gerome Sam III M.D   On: 09/23/2020 20:14    Cardiac Studies   Echo 6/25:  1. Left ventricular ejection fraction, by estimation, is <20%. The left  ventricle has severely decreased function. The left ventricle demonstrates  global hypokinesis. The left ventricular internal cavity size was  moderately dilated. Left ventricular  diastolic parameters are consistent with Grade II diastolic dysfunction  (pseudonormalization). Elevated left  atrial pressure. There is the  interventricular septum is flattened in diastole ('D' shaped left  ventricle), consistent with right ventricular  volume overload.   2. Right ventricular systolic function is moderately reduced. The right  ventricular size is moderately enlarged.   3. Left atrial size was mildly dilated.   4. Right atrial size was mildly dilated.   5. The mitral valve is normal in structure. Trivial mitral valve  regurgitation.   6. The tricuspid valve is abnormal. Tricuspid valve regurgitation is  severe.   7. The aortic valve is tricuspid. Aortic valve regurgitation is not  visualized. No aortic stenosis is present.   Patient Profile     60 y.o. male with a hx of (HFrEF) heart failure with reduced ejection fraction, non-ischemic cardiomyopathy, hypertension, diabetes mellitus, hyperlipidemia, OSA, obesity who is being seen for the evaluation of congestive heart failure   Assessment & Plan    Acute on chronic combined systolic and diastolic heart failure: non-ischemic cardiomyopathy.  NYHA IV.  Pt has biventricular failure with EF < 20 and presents with volume overload.  He has had issues with non-adherence in the past and has stopped Entresto, Spironolactone, hydralazine/nitrates.   -Lactate elevated on admission, has now normalized.  Hold beta blocker for now -Continue diuresis with IV lasix 80 mg BID -Continue aldactone 12.5 mg daily -Will fold in GDMT as tolerated  Hypertension: on Carvedilol at home.  Holding for now.  Added aldactone as above.   NSVT: continue to monitor.  16 beat run of NSVT on admission.  His K+ was low (2.8) and may be the cause.  Maintain K>4, Mag>2   For questions or updates, please contact [facility] Please consult [email or web address] for contact info under        Signed, Little Ishikawa, MD  09/25/2020, 9:04 AM

## 2020-09-25 NOTE — Progress Notes (Signed)
Kevin Long  HGD:[id number] DOB: 1960-08-19 DOA: 09/21/2020 PCP: Fleet Contras, MD    Brief Narrative:  60 year old with a history of PVCs, systolic CHF with EF 20-25%, DM2, HTN, OSA, PE, and obesity who was sent to the ED after being seen by his Cardiologist and found to be in severe volume overload.  Consultants:  Cardiology  Code Status: FULL CODE  Antimicrobials:  None  DVT prophylaxis: Lovenox  Subjective: No acute issues or events overnight, remains moderately overloaded, edematous with complaints of orthopnea and difficulty ambulating.  Lengthy discussion at bedside about dietary medication and lifestyle compliance  Assessment & Plan:  Acute respiratory failure without hypoxia secondary to acute exacerbation of chronic systolic heart failure - TTE May 2021 with EF <20% and severely reduced RV systolic function  -Continue diuretics -Lengthy discussion about dietary, lifestyle, medication compliance and increased risk of morbidity mortality should he continue to be noncompliant -Continue I's and O's, fluid restriction, salt restriction, diuresing well over the past 24 hours  Hypokalemia Due to diuretic -corrected with supplementation  Elevated total bilirubin Felt to be due to passive hepatic congestion in setting of severe CHF and RV failure  HTN Continue current medications, blood pressure borderline low, will discontinue home meds if necessary for more aggressive diuresis  Uncontrolled DM2 with hyperglycemia A1c 7.1  Continue sliding scale insulin  Normocytic anemia, likely iron deficiency with concurrent anemia of chronic disease Hemoglobin stable, discussed increasing p.o. iron intake  History of PE Has completed his prescribed treatment course of Eliquis -not currently on this medication  HLD Does not appear to be on statin currently, discussed need for dietary and lifestyle modifications  OSA Does not use CPAP at home -uses nocturnal nasal cannula  oxygen  Morbid obesity - Body mass index is 42.93 kg/m.   Family Communication:  Status is: Inpatient  Remains inpatient appropriate because:Inpatient level of care appropriate due to severity of illness  Dispo: The patient is from: Home              Anticipated d/c is to: Home              Patient currently is not medically stable to d/c.   Difficult to place patient No   Objective: Blood pressure 118/74, pulse 96, temperature 98.7 F (37.1 C), temperature source Oral, resp. rate 15, height 5\' 6"  (1.676 m), weight 120.7 kg, SpO2 99 %.  Intake/Output Summary (Last 24 hours) at 09/25/2020 0733 Last data filed at 09/25/2020 0600 Gross per 24 hour  Intake 720 ml  Output 4620 ml  Net -3900 ml    Filed Weights   09/23/20 0441 09/24/20 0625 09/25/20 0531  Weight: 124.3 kg 123.7 kg 120.7 kg    Examination: General:  Pleasantly resting in bed, No acute distress. HEENT:  Normocephalic atraumatic.  Sclerae nonicteric, noninjected.  Extraocular movements intact bilaterally. Neck:  Without mass or deformity.  Trachea is midline. Lungs:  Clear to auscultate bilaterally without rhonchi, wheeze, or rales. Heart:  Regular rate and rhythm.  Without murmurs, rubs, or gallops. Abdomen:  Soft, nontender, nondistended.  Without guarding or rebound. Extremities: Without cyanosis, clubbing, 2+ pitting edema distally to the knee. Vascular:  Dorsalis pedis and posterior tibial pulses weakly palpable bilaterally. Skin:  Warm and dry, no erythema, no ulcerations.   CBC: Recent Labs  Lab 09/21/20 1117 09/23/20 0306 09/25/20 0216  WBC 8.1 10.3 9.1  NEUTROABS 5.8  --   --   HGB 11.4* 11.1* 10.9*  HCT 34.5*  33.4* 32.2*  MCV 88.9 88.4 88.5  PLT 331 316 307    Basic Metabolic Panel: Recent Labs  Lab 09/23/20 0306 09/24/20 0311 09/25/20 0216  NA 138 136 137  K 3.8 3.8 3.7  CL 102 102 99  CO2 27 25 29   GLUCOSE 111* 155* 125*  BUN 9 9 9   CREATININE 1.18 1.19 1.27*  CALCIUM 9.0 8.7*  9.2  MG 1.9 1.8 1.8    GFR: Estimated Creatinine Clearance: 74.8 mL/min (A) (by C-G formula based on SCr of 1.27 mg/dL (H)).  Liver Function Tests: Recent Labs  Lab 09/22/20 0213 09/23/20 0306 09/24/20 0311 09/25/20 0216  AST 24 24 28 21   ALT 13 12 10 11   ALKPHOS 107 110 114 121  BILITOT 2.8* 3.5* 2.8* 2.4*  PROT 6.7 6.9 6.7 6.6  ALBUMIN 3.5 3.7 3.5 3.5    Recent Labs  Lab 09/21/20 1117  LIPASE 53*      Coagulation Profile: Recent Labs  Lab 09/21/20 1117  INR 1.5*       HbA1C: Hgb A1c MFr Bld  Date/Time Value Ref Range Status  09/21/2020 07:04 PM 7.1 (H) 4.8 - 5.6 % Final    Comment:    (NOTE) Pre diabetes:          5.7%-6.4%  Diabetes:              >6.4%  Glycemic control for   <7.0% adults with diabetes   08/12/2019 12:12 AM 12.4 (H) 4.8 - 5.6 % Final    Comment:    (NOTE) Pre diabetes:          5.7%-6.4% Diabetes:              >6.4% Glycemic control for   <7.0% adults with diabetes     CBG: Recent Labs  Lab 09/24/20 0803 09/24/20 1157 09/24/20 1630 09/24/20 2029 09/25/20 0731  GLUCAP 136* 178* 75 112* 156*     Recent Results (from the past 240 hour(s))  Urine culture     Status: Abnormal   Collection Time: 09/17/20  8:41 PM   Specimen: Urine, Random  Result Value Ref Range Status   Specimen Description   Final    URINE, RANDOM Performed at [facility], [address], [place], [place]    Special Requests   Final    NONE Performed at [facility], [address] [place], [place]    Culture (A)  Final    <10,000 COLONIES/mL INSIGNIFICANT GROWTH Performed at [facility], [address], [place], [address] [facility]    Report Status 09/19/2020 FINAL  Final  Resp Panel by RT-PCR (Flu A&B, Covid) Nasopharyngeal Swab     Status: None   Collection Time: 09/21/20 11:17 AM   Specimen: Nasopharyngeal Swab; Nasopharyngeal(NP) swabs in vial transport medium  Result  Value Ref Range Status   SARS Coronavirus 2 by RT PCR NEGATIVE NEGATIVE Final    Comment: (NOTE) SARS-CoV-2 target nucleic acids are NOT DETECTED.  The SARS-CoV-2 RNA is generally detectable in upper respiratory specimens during the acute phase of infection. The lowest concentration of SARS-CoV-2 viral copies this assay can detect is 138 copies/mL. A negative result does not preclude SARS-Cov-2 infection and should not be used as the sole basis for treatment or other patient management decisions. A negative result may occur with  improper specimen collection/handling, submission of specimen other than nasopharyngeal swab, presence of viral mutation(s) within the areas targeted by  this assay, and inadequate number of viral copies(<138 copies/mL). A negative result must be combined with clinical observations, patient history, and epidemiological information. The expected result is Negative.  Fact Sheet for Patients:  BloggerCourse.com  Fact Sheet for Healthcare Providers:  SeriousBroker.it  This test is no t yet approved or cleared by the [place] FDA and  has been authorized for detection and/or diagnosis of SARS-CoV-2 by FDA under an Emergency Use Authorization (EUA). This EUA will remain  in effect (meaning this test can be used) for the duration of the COVID-19 declaration under Section 564(b)(1) of the Act, 21 U.S.C.section 360bbb-3(b)(1), unless the authorization is terminated  or revoked sooner.       Influenza A by PCR NEGATIVE NEGATIVE Final   Influenza B by PCR NEGATIVE NEGATIVE Final    Comment: (NOTE) The Xpert Xpress SARS-CoV-2/FLU/RSV plus assay is intended as an aid in the diagnosis of influenza from Nasopharyngeal swab specimens and should not be used as a sole basis for treatment. Nasal washings and aspirates are unacceptable for Xpert Xpress SARS-CoV-2/FLU/RSV testing.  Fact Sheet for  Patients: BloggerCourse.com  Fact Sheet for Healthcare Providers: SeriousBroker.it  This test is not yet approved or cleared by the [place] FDA and has been authorized for detection and/or diagnosis of SARS-CoV-2 by FDA under an Emergency Use Authorization (EUA). This EUA will remain in effect (meaning this test can be used) for the duration of the COVID-19 declaration under Section 564(b)(1) of the Act, 21 U.S.C. section 360bbb-3(b)(1), unless the authorization is terminated or revoked.  Performed at [facility], [address] [place], [place] [address]       Scheduled Meds:  aspirin EC  81 mg Oral Daily   enoxaparin (LOVENOX) injection  60 mg Subcutaneous Q24H   furosemide  80 mg Intravenous BID   insulin aspart  0-20 Units Subcutaneous TID WC   potassium chloride  40 mEq Oral BID   sodium chloride flush  3 mL Intravenous Q12H   spironolactone  12.5 mg Oral Daily   Vitamin D (Ergocalciferol)  50,000 Units Oral Q Mon   Continuous Infusions:  sodium chloride       LOS: 4 days   Carma Leaven DO [facility] Pager - Secure chat  If 7PM-7AM, please contact night-coverage per Amion 09/25/2020, 7:33 AM

## 2020-09-25 NOTE — Progress Notes (Signed)
Occupational Therapy Treatment Patient Details Name: Kevin Long MRN: [id number] DOB: April 30, 1960 Today's Date: 09/25/2020    History of present illness 60 y.o. male admitted on 09/21/20 foracute on chronic CHF exacerbation, anasarca, elevated total bili, INR, likely due to volume overload, soft BPs on admission.  Pt with significant PMH of fall, PE DM2, essential HTN, morbid obesity, degeneration of the lumbosacral intervertebral disc. gout, OSA, PVCs, vertigo, and foot surgery.   OT comments  Pt making steady progress towards OT goals this session. Pt continues to reports scrotal edema causing pain and impaired ability to mobilize OOB without pain. Manufactured scrotal sling to be worn for comfort and to decrease edema, provided education on donning and doffing of sling with pt verbalizing understanding. Pt able to don socks from EOB with sock aid with intermittent min cues for sequencing, and supervision for UB ADLS. Pt completed functional mobility greater than a household distance with Rw and gross supervision with VSS on RA. DC plan remains appropriate, will follow acutely per POC.    Follow Up Recommendations  Home health OT;Supervision - Intermittent    Equipment Recommendations  Tub/shower bench    Recommendations for Other Services      Precautions / Restrictions Precautions Precautions: Fall Precaution Comments: h/o fall, scrotal sling Restrictions Weight Bearing Restrictions: No       Mobility Bed Mobility Overal bed mobility: Modified Independent             General bed mobility comments: no physical assist needed, use of bed features    Transfers Overall transfer level: Needs assistance Equipment used: Rolling walker (2 wheeled);None Transfers: Sit to/from Stand Sit to Stand: Supervision         General transfer comment: pt standing multiple times from EOB to urinate with no more than supervision    Balance Overall balance assessment: Needs  assistance Sitting-balance support: Feet supported;No upper extremity supported Sitting balance-Leahy Scale: Good     Standing balance support: No upper extremity supported;During functional activity Standing balance-Leahy Scale: Fair Standing balance comment: standing to urinate with no UE support                           ADL either performed or assessed with clinical judgement   ADL Overall ADL's : Needs assistance/impaired                 Upper Body Dressing : Sitting;Supervision/safety;Set up Upper Body Dressing Details (indicate cue type and reason): to don posterior gown Lower Body Dressing: Set up;With adaptive equipment;Cueing for sequencing Lower Body Dressing Details (indicate cue type and reason): MIN intermittent cues to use sock aid to don socks Toilet Transfer: Radiographer, therapeutic Details (indicate cue type and reason): standing to use urinal         Functional mobility during ADLs: Rolling walker;Supervision/safety General ADL Comments: applied scrotal sling with pt reporting decreased pain and better support     Vision       Perception     Praxis      Cognition Arousal/Alertness: Awake/alert Behavior During Therapy: WFL for tasks assessed/performed Overall Cognitive Status: Within Functional Limits for tasks assessed                                          Exercises     Shoulder Instructions  General Comments applied scrotal sling and provided extra material and demonstration of how to don<>doff, pt on RA with Spo2 96 % HR in 90s post mobility    Pertinent Vitals/ Pain       Pain Assessment: No/denies pain  Home Living                                          Prior Functioning/Environment              Frequency  Min 2X/week        Progress Toward Goals  OT Goals(current goals can now be found in the care plan section)  Progress towards OT goals:  Progressing toward goals  Acute Rehab OT Goals Patient Stated Goal: to go home OT Goal Formulation: With patient Time For Goal Achievement: 10/07/20 Potential to Achieve Goals: Good  Plan Discharge plan remains appropriate;Frequency remains appropriate    Co-evaluation                 AM-PAC OT "6 Clicks" Daily Activity     Outcome Measure   Help from another person eating meals?: None Help from another person taking care of personal grooming?: None Help from another person toileting, which includes using toliet, bedpan, or urinal?: None Help from another person bathing (including washing, rinsing, drying)?: A Little Help from another person to put on and taking off regular upper body clothing?: None Help from another person to put on and taking off regular lower body clothing?: A Little 6 Click Score: 22    End of Session Equipment Utilized During Treatment: Rolling walker  OT Visit Diagnosis: Unsteadiness on feet (R26.81);Other abnormalities of gait and mobility (R26.89);Muscle weakness (generalized) (M62.81)   Activity Tolerance Patient tolerated treatment well   Patient Left in bed;with call bell/phone within reach;Other (comment) (sitting EOB)   Nurse Communication Mobility status;Other (comment) (applied scrotal sling, asking for graham crackers)        Time: 4270-6237 OT Time Calculation (min): 31 min  Charges: OT General Charges $OT Visit: 1 Visit OT Treatments $Self Care/Home Management : 23-37 mins  Lenor Derrick., COTA/L Acute Rehabilitation Services [phone]    Barron Schmid 09/25/2020, 12:43 PM

## 2020-09-25 NOTE — Telephone Encounter (Signed)
Office notes have been printed and faxed to [facility].

## 2020-09-25 NOTE — Progress Notes (Signed)
Heart Failure Stewardship Pharmacist Progress Note   PCP: Fleet Contras, MD PCP-Cardiologist: None    HPI:  60 yo M with PMH of HFrEF, NICM, HTN, T2DM, HLD, obesity, and OSA. He presented to the ED on 6/24 with shortness of breath and edema. CXR showed clear lungs and no evidence of pulmonary edema. An ECHO was done on 6/25 and LVEF was <20% with moderately reduced RV systolic function (stable from prior ECHO in 07/2019). Of note, patient has stopped taking Entresto and spironolactone in the past due to vertigo and has intolerance to BiDil (unclear what his reaction was to this).  Current HF Medications: Furosemide 80 mg IV BID Spironolactone 12.5 mg daily  Prior to admission HF Medications: Furosemide 40 mg daily Carvedilol 12.5 mg BID  Pertinent Lab Values: Serum creatinine 1.27, BUN 9, Potassium 3.7, Sodium 137, BNP 1031.1, Magnesium 1.8 Hgb A1c 7.1  Vital Signs: Weight: 266 lbs (admission weight: 272 lbs) Blood pressure: 120/70s  Heart rate: 70s   Medication Assistance / Insurance Benefits Check: Does the patient have prescription insurance?  Yes Type of insurance plan: [place] Medicaid  Outpatient Pharmacy:  Prior to admission outpatient pharmacy: [facility] Is the patient willing to use [facility] TOC pharmacy at discharge? Yes Is the patient willing to transition their outpatient pharmacy to utilize a [facility] outpatient pharmacy?   Pending    Assessment: 1. Acute on chronic systolic CHF (EF <52%), due to NICM. NYHA class IV symptoms. - Continue furosemide 80 mg IV BID. Response has improved with increased dose. Down 6 lbs from yesterday and net -6.8L since admission. - Holding carvedilol for now - Consider starting losartan once BP allows. Noted intolerance to Entresto with dizziness/vertigo - Continue spironolactone 12.5 mg daily  - Consider starting Farxiga 10 mg daily prior to discharge  Plan: 1) Medication changes recommended at this time: - Continue IV diuresis  2)  Patient assistance: - Marcelline Deist requires prior authorization - if this is added, I can complete the PA prior to discharge - Patient has [place] Medicaid (copays $0-3 each per month) - HF TOC appt made for 7/7  3)  Education  - To be completed prior to discharge  Sharen Hones, PharmD, BCPS Heart Failure Stewardship Pharmacist Phone [phone]

## 2020-09-25 NOTE — Progress Notes (Signed)
Physical Therapy Treatment Patient Details Name: Kevin Long MRN: [id number] DOB: [birth year] Today's Date: 09/25/2020    History of Present Illness Pt is a 60 y.o. male admitted on 09/21/20 for severe volume overload. Workup for acute respiratory failure without hypoxia secondary to CHF exacerbation. PMH includes fall, PE, DM2, HTN, morbid obesity, degeneration of the lumbosacral intervertebral disc, gout, OSA, PVCs, vertigo, foot sx.   PT Comments    Pt progressing well with mobility. Today's session focused on gait and stair training. Pt remains limited by decreased activity tolerance, but pt notes improvements in DOE. Educ re: activity recommendations, energy conservation strategies. Will continue to follow acutely to address established goals.   Follow Up Recommendations  Home health PT     Equipment Recommendations  None recommended by PT    Recommendations for Other Services       Precautions / Restrictions Precautions Precautions: Fall;Other (comment) Precaution Comments: Urinary frequency; scrotal swelilng (OT made scrotal sling 6/28, but already soiled) Restrictions Weight Bearing Restrictions: No    Mobility  Bed Mobility Overal bed mobility: Modified Independent                  Transfers Overall transfer level: Modified independent Equipment used: Rolling walker (2 wheeled);None Transfers: Sit to/from Stand           General transfer comment: Multiple sit<>stands from EOB with and without RW  Ambulation/Gait Ambulation/Gait assistance: Supervision Gait Distance (Feet): 260 Feet Assistive device: Rolling walker (2 wheeled) Gait Pattern/deviations: Step-through pattern;Wide base of support Gait velocity: Decreased   General Gait Details: Slow, steady gait with RW and supervision for safety; pt able to ambulate in/out of bathroom without DME, intermittent UE support on door/furniture, no overt instability or LOB. Pt reports using SPC at home but  declines gait training with SPC, reports preference for using RW   Stairs Stairs: Yes Stairs assistance: Supervision Stair Management: One rail Right;Step to pattern;Forwards Number of Stairs: 9 General stair comments: Ascend/descend 9 steps with UE support on R-side rail, step-to pattern, supervision for safety; DOE 2-3/4 with cues for pursed lip breathing; standing rest break at top before descending steps   Wheelchair Mobility    Modified Rankin (Stroke Patients Only)       Balance Overall balance assessment: Needs assistance Sitting-balance support: Feet supported;No upper extremity supported Sitting balance-Leahy Scale: Good     Standing balance support: No upper extremity supported;During functional activity Standing balance-Leahy Scale: Fair Standing balance comment: standing to urinate with no UE support                            Cognition Arousal/Alertness: Awake/alert Behavior During Therapy: WFL for tasks assessed/performed Overall Cognitive Status: Within Functional Limits for tasks assessed                                        Exercises      General Comments General comments (skin integrity, edema, etc.): Educ on energy conservation strategies with ambulation and stairs; activity recommendations, including walking program      Pertinent Vitals/Pain Pain Assessment: No/denies pain    Home Living                      Prior Function            PT Goals (current goals can now  be found in the care plan section) Progress towards PT goals: Progressing toward goals    Frequency    Min 3X/week      PT Plan Current plan remains appropriate    Co-evaluation              AM-PAC PT "6 Clicks" Mobility   Outcome Measure  Help needed turning from your back to your side while in a flat bed without using bedrails?: None Help needed moving from lying on your back to sitting on the side of a flat bed without  using bedrails?: None Help needed moving to and from a bed to a chair (including a wheelchair)?: None Help needed standing up from a chair using your arms (e.g., wheelchair or bedside chair)?: None Help needed to walk in hospital room?: A Little Help needed climbing 3-5 steps with a railing? : A Little 6 Click Score: 22    End of Session   Activity Tolerance: Patient tolerated treatment well Patient left: in bed;with call bell/phone within reach Nurse Communication: Mobility status PT Visit Diagnosis: Muscle weakness (generalized) (M62.81);Difficulty in walking, not elsewhere classified (R26.2)     Time: 4854-6270 PT Time Calculation (min) (ACUTE ONLY): 20 min  Charges:  $Gait Training: 8-22 mins                     Ina Homes, PT, DPT Acute Rehabilitation Services  Pager [phone] Office [phone]  Malachy Chamber 09/25/2020, 6:17 PM

## 2020-09-26 LAB — BASIC METABOLIC PANEL
Anion gap: 10 (ref 5–15)
BUN: 9 mg/dL (ref 8–23)
CO2: 28 mmol/L (ref 22–32)
Calcium: 9.4 mg/dL (ref 8.9–10.3)
Chloride: 100 mmol/L (ref 98–111)
Creatinine, Ser: 1.24 mg/dL (ref 0.61–1.24)
GFR, Estimated: >60 mL/min (ref >60)
Glucose, Bld: 104 mg/dL — ABNORMAL HIGH (ref 70–99)
Potassium: 4.1 mmol/L (ref 3.5–5.1)
Sodium: 138 mmol/L (ref 135–145)

## 2020-09-26 LAB — MAGNESIUM: Magnesium: 1.8 mg/dL (ref 1.7–2.4)

## 2020-09-26 MED ORDER — CARVEDILOL 3.125 MG PO TABS
3.1250 mg | ORAL_TABLET | Freq: Two times a day (BID) | ORAL | Status: DC
  Administered 2020-09-26 – 2020-09-28 (×5): 3.125 mg via ORAL
  Filled 2020-09-26 (×5): qty 1

## 2020-09-26 MED ORDER — MAGNESIUM OXIDE -MG SUPPLEMENT 400 (240 MG) MG PO TABS
400.0000 mg | ORAL_TABLET | Freq: Two times a day (BID) | ORAL | Status: DC
  Administered 2020-09-26 – 2020-09-27 (×4): 400 mg via ORAL
  Filled 2020-09-26 (×4): qty 1

## 2020-09-26 NOTE — Progress Notes (Signed)
PROGRESS NOTE    Kevin Long  JSH:[id number] DOB: November 30, 1960 DOA: 09/21/2020 PCP: Fleet Contras, MD    Brief Narrative:  60 year old with a history of PVCs, systolic CHF with EF 20-25%, DM2, HTN, OSA, PE, and obesity who was sent to the ED after being seen by his Cardiologist and found to be in severe volume overload.  Assessment & Plan:   Principal Problem:   Acute exacerbation of CHF (congestive heart failure) (HCC) Active Problems:   Hyperlipidemia   OBSTRUCTIVE SLEEP APNEA   Essential hypertension   Uncontrolled type 2 diabetes mellitus with hyperglycemia, with long-term current use of insulin (HCC)   Anasarca   Pulmonary embolism associated with COVID-19 (HCC)  Acute respiratory failure without hypoxia secondary to acute exacerbation of chronic systolic heart failure - TTE May 2021 with EF <20% and severely reduced RV systolic function  -Continue diuretics -Cont to encourage good dietary, lifestyle, medication compliance -Continue I's and O's, fluid restriction, salt restriction, diuresing well over the past 24 hours -Cont with lasix as tolerated   Hypokalemia -normalized   Elevated total bilirubin Felt to be due to passive hepatic congestion in setting of severe CHF and RV failure   HTN Continue current medications -BP stable at this time   Uncontrolled DM2 with hyperglycemia A1c 7.1  Continue sliding scale insulin -glucose trends reviewed, stable   Normocytic anemia, likely iron deficiency with concurrent anemia of chronic disease Hemoglobin stable, discussed increasing p.o. iron intake   History of PE Has completed his prescribed treatment course of Eliquis -not currently on this medication On minimal o2 support   HLD Does not appear to be on statin currently, discussed need for dietary and lifestyle modifications   OSA Does not use CPAP at home -uses nocturnal nasal cannula oxygen   Morbid obesity - Body mass index is 42.93 kg/m.    DVT  prophylaxis: lovenox subq Code Status: Full Family Communication: Pt in room, family not at bedside  Status is: Inpatient  Remains inpatient appropriate because:Inpatient level of care appropriate due to severity of illness  Dispo: The patient is from: Home              Anticipated d/c is to: Home              Patient currently is not medically stable to d/c.   Difficult to place patient No       Consultants:  Cardiology  Procedures:    Antimicrobials: Anti-infectives (From admission, onward)    Start     Dose/Rate Route Frequency Ordered Stop   09/21/20 2200  cephALEXin (KEFLEX) capsule 500 mg  Status:  Discontinued        500 mg Oral 2 times daily 09/21/20 1815 09/21/20 1839       Subjective: Reports feeling better today  Objective: Vitals:   09/25/20 1650 09/25/20 2131 09/26/20 0008 09/26/20 0338  BP: 116/67 115/62 105/69 118/68  Pulse: 76 83  88  Resp: 16 17 17 16   Temp:  98 F (36.7 C) 98.9 F (37.2 C) 98.5 F (36.9 C)  TempSrc:  Oral Oral Oral  SpO2: 95% 95% 98% 94%  Weight:    117.1 kg  Height:        Intake/Output Summary (Last 24 hours) at 09/26/2020 1442 Last data filed at 09/26/2020 1207 Gross per 24 hour  Intake 360 ml  Output 5325 ml  Net -4965 ml   Filed Weights   09/24/20 0625 09/25/20 0531 09/26/20 0338  Weight: 123.7  kg 120.7 kg 117.1 kg    Examination: General exam: Awake, laying in bed, in nad Respiratory system: Normal respiratory effort, no wheezing Cardiovascular system: regular rate, s1, s2 Gastrointestinal system: Soft, nondistended, positive BS Central nervous system: CN2-12 grossly intact, strength intact Extremities: Perfused, no clubbing Skin: Normal skin turgor, no notable skin lesions seen Psychiatry: Mood normal // no visual hallucinations   Data Reviewed: I have personally reviewed following labs and imaging studies  CBC: Recent Labs  Lab 09/21/20 1117 09/23/20 0306 09/25/20 0216  WBC 8.1 10.3 9.1   NEUTROABS 5.8  --   --   HGB 11.4* 11.1* 10.9*  HCT 34.5* 33.4* 32.2*  MCV 88.9 88.4 88.5  PLT 331 316 307   Basic Metabolic Panel: Recent Labs  Lab 09/22/20 1935 09/23/20 0306 09/24/20 0311 09/25/20 0216 09/26/20 0325  NA 137 138 136 137 138  K 3.4* 3.8 3.8 3.7 4.1  CL 101 102 102 99 100  CO2 29 27 25 29 28   GLUCOSE 120* 111* 155* 125* 104*  BUN 9 9 9 9 9   CREATININE 1.12 1.18 1.19 1.27* 1.24  CALCIUM 8.9 9.0 8.7* 9.2 9.4  MG 1.8 1.9 1.8 1.8 1.8   GFR: Estimated Creatinine Clearance: 75.3 mL/min (by C-G formula based on SCr of 1.24 mg/dL). Liver Function Tests: Recent Labs  Lab 09/21/20 1117 09/22/20 0213 09/23/20 0306 09/24/20 0311 09/25/20 0216  AST 24 24 24 28 21   ALT 13 13 12 10 11   ALKPHOS 121 107 110 114 121  BILITOT 3.4* 2.8* 3.5* 2.8* 2.4*  PROT 7.6 6.7 6.9 6.7 6.6  ALBUMIN 4.0 3.5 3.7 3.5 3.5   Recent Labs  Lab 09/21/20 1117  LIPASE 53*   No results for input(s): AMMONIA in the last 168 hours. Coagulation Profile: Recent Labs  Lab 09/21/20 1117  INR 1.5*   Cardiac Enzymes: No results for input(s): CKTOTAL, CKMB, CKMBINDEX, TROPONINI in the last 168 hours. BNP (last 3 results) No results for input(s): PROBNP in the last 8760 hours. HbA1C: No results for input(s): HGBA1C in the last 72 hours. CBG: Recent Labs  Lab 09/24/20 1630 09/24/20 2029 09/25/20 0731 09/25/20 1636 09/25/20 2130  GLUCAP 75 112* 156* 113* 137*   Lipid Profile: Recent Labs    09/25/20 0216  CHOL 157  HDL 38*  LDLCALC 106*  TRIG 67  CHOLHDL 4.1   Thyroid Function Tests: No results for input(s): TSH, T4TOTAL, FREET4, T3FREE, THYROIDAB in the last 72 hours. Anemia Panel: Recent Labs    09/25/20 0216  VITAMINB12 441  FOLATE 13.4  FERRITIN 104  TIBC 288  IRON 34*  RETICCTPCT 2.4   Sepsis Labs: Recent Labs  Lab 09/23/20 1409 09/23/20 1628 09/24/20 0311  LATICACIDVEN 2.5* 2.7* 1.4    Recent Results (from the past 240 hour(s))  Urine culture      Status: Abnormal   Collection Time: 09/17/20  8:41 PM   Specimen: Urine, Random  Result Value Ref Range Status   Specimen Description   Final    URINE, RANDOM Performed at [facility], [address] [place], [place]    Special Requests   Final    NONE Performed at [facility], [address] [place], [place]    Culture (A)  Final    <10,000 COLONIES/mL INSIGNIFICANT GROWTH Performed at [facility], [address], [place], [address] [facility]    Report Status 09/19/2020 FINAL  Final  Resp Panel by  RT-PCR (Flu A&B, Covid) Nasopharyngeal Swab     Status: None   Collection Time: 09/21/20 11:17 AM   Specimen: Nasopharyngeal Swab; Nasopharyngeal(NP) swabs in vial transport medium  Result Value Ref Range Status   SARS Coronavirus 2 by RT PCR NEGATIVE NEGATIVE Final    Comment: (NOTE) SARS-CoV-2 target nucleic acids are NOT DETECTED.  The SARS-CoV-2 RNA is generally detectable in upper respiratory specimens during the acute phase of infection. The lowest concentration of SARS-CoV-2 viral copies this assay can detect is 138 copies/mL. A negative result does not preclude SARS-Cov-2 infection and should not be used as the sole basis for treatment or other patient management decisions. A negative result may occur with  improper specimen collection/handling, submission of specimen other than nasopharyngeal swab, presence of viral mutation(s) within the areas targeted by this assay, and inadequate number of viral copies(<138 copies/mL). A negative result must be combined with clinical observations, patient history, and epidemiological information. The expected result is Negative.  Fact Sheet for Patients:  BloggerCourse.com  Fact Sheet for Healthcare Providers:  SeriousBroker.it  This test is no t yet approved or cleared by the [place] FDA and  has been authorized  for detection and/or diagnosis of SARS-CoV-2 by FDA under an Emergency Use Authorization (EUA). This EUA will remain  in effect (meaning this test can be used) for the duration of the COVID-19 declaration under Section 564(b)(1) of the Act, 21 U.S.C.section 360bbb-3(b)(1), unless the authorization is terminated  or revoked sooner.       Influenza A by PCR NEGATIVE NEGATIVE Final   Influenza B by PCR NEGATIVE NEGATIVE Final    Comment: (NOTE) The Xpert Xpress SARS-CoV-2/FLU/RSV plus assay is intended as an aid in the diagnosis of influenza from Nasopharyngeal swab specimens and should not be used as a sole basis for treatment. Nasal washings and aspirates are unacceptable for Xpert Xpress SARS-CoV-2/FLU/RSV testing.  Fact Sheet for Patients: BloggerCourse.com  Fact Sheet for Healthcare Providers: SeriousBroker.it  This test is not yet approved or cleared by the [place] FDA and has been authorized for detection and/or diagnosis of SARS-CoV-2 by FDA under an Emergency Use Authorization (EUA). This EUA will remain in effect (meaning this test can be used) for the duration of the COVID-19 declaration under Section 564(b)(1) of the Act, 21 U.S.C. section 360bbb-3(b)(1), unless the authorization is terminated or revoked.  Performed at [facility], [address], [place], [place] [address]      Radiology Studies: No results found.  Scheduled Meds:  aspirin EC  81 mg Oral Daily   carvedilol  3.125 mg Oral BID WC   enoxaparin (LOVENOX) injection  60 mg Subcutaneous Q24H   furosemide  80 mg Intravenous BID   insulin aspart  0-20 Units Subcutaneous TID WC   magnesium oxide  400 mg Oral BID   potassium chloride  40 mEq Oral BID   sodium chloride flush  3 mL Intravenous Q12H   spironolactone  12.5 mg Oral Daily   Vitamin D (Ergocalciferol)  50,000 Units Oral Q Mon   Continuous Infusions:  sodium chloride        LOS: 5 days   Rickey Barbara, MD [facility] Pager On Amion  If 7PM-7AM, please contact night-coverage 09/26/2020, 2:42 PM

## 2020-09-26 NOTE — Care Management (Signed)
09-26-20 1550 Case Manager received a message from PT that stated that the patient will no longer need HH PT services and no durable medical equipment at this time. Case Manager will follow for additional needs.

## 2020-09-26 NOTE — Progress Notes (Signed)
Progress Note  Patient Name: Kevin Long Date of Encounter: 09/26/2020  [facility] Cardiologist: None   Subjective   Net -4.4 L on IV Lasix 80 mg twice daily yesterday, -11.3 L on admission.  BP 118/68.  Renal function stable (1.2 > 1.2 > 1.3 > 1.2).  Weight down 8lbs from yesterday, down 16 lbs total.  Reports dyspnea improved, denies any chest pain  Inpatient Medications    Scheduled Meds:  aspirin EC  81 mg Oral Daily   enoxaparin (LOVENOX) injection  60 mg Subcutaneous Q24H   furosemide  80 mg Intravenous BID   insulin aspart  0-20 Units Subcutaneous TID WC   magnesium oxide  400 mg Oral Daily   potassium chloride  40 mEq Oral BID   sodium chloride flush  3 mL Intravenous Q12H   spironolactone  12.5 mg Oral Daily   Vitamin D (Ergocalciferol)  50,000 Units Oral Q Mon   Continuous Infusions:  sodium chloride     PRN Meds: sodium chloride, acetaminophen, albuterol, gabapentin, linaclotide, oxymetazoline, sodium chloride flush   Vital Signs    Vitals:   09/25/20 1650 09/25/20 2131 09/26/20 0008 09/26/20 0338  BP: 116/67 115/62 105/69 118/68  Pulse: 76 83  88  Resp: 16 17 17 16   Temp:  98 F (36.7 C) 98.9 F (37.2 C) 98.5 F (36.9 C)  TempSrc:  Oral Oral Oral  SpO2: 95% 95% 98% 94%  Weight:    117.1 kg  Height:        Intake/Output Summary (Last 24 hours) at 09/26/2020 0845 Last data filed at 09/26/2020 0519 Gross per 24 hour  Intake 600 ml  Output 5000 ml  Net -4400 ml    Last 3 Weights 09/26/2020 09/25/2020 09/24/2020  Weight (lbs) 258 lb 1.6 oz 266 lb 272 lb 9.6 oz  Weight (kg) 117.073 kg 120.657 kg 123.651 kg      Telemetry    NSR - Personally Reviewed  ECG    No new ECG - Personally Reviewed  Physical Exam   GEN: No acute distress.   Neck: + JVD Cardiac: RRR, 2/6 systolic murmur, distant heart sounds Respiratory: Clear to auscultation bilaterally. GI: Distended MS: Trace edema Neuro:  Nonfocal  Psych: Normal affect   Labs     High Sensitivity Troponin:  No results for input(s): TROPONINIHS in the last 720 hours.    Chemistry Recent Labs  Lab 09/23/20 0306 09/24/20 0311 09/25/20 0216 09/26/20 0325  NA 138 136 137 138  K 3.8 3.8 3.7 4.1  CL 102 102 99 100  CO2 27 25 29 28   GLUCOSE 111* 155* 125* 104*  BUN 9 9 9 9   CREATININE 1.18 1.19 1.27* 1.24  CALCIUM 9.0 8.7* 9.2 9.4  PROT 6.9 6.7 6.6  --   ALBUMIN 3.7 3.5 3.5  --   AST 24 28 21   --   ALT 12 10 11   --   ALKPHOS 110 114 121  --   BILITOT 3.5* 2.8* 2.4*  --   GFRNONAA >60 >60 >60 >60  ANIONGAP 9 9 9 10       Hematology Recent Labs  Lab 09/21/20 1117 09/23/20 0306 09/25/20 0216  WBC 8.1 10.3 9.1  RBC 3.88* 3.78* 3.64*  3.73*  HGB 11.4* 11.1* 10.9*  HCT 34.5* 33.4* 32.2*  MCV 88.9 88.4 88.5  MCH 29.4 29.4 29.9  MCHC 33.0 33.2 33.9  RDW 17.2* 17.1* 17.2*  PLT 331 316 307     BNP Recent  Labs  Lab 09/21/20 1117  BNP 1,031.1*      DDimer No results for input(s): DDIMER in the last 168 hours.   Radiology    No results found.  Cardiac Studies   Echo 6/25:  1. Left ventricular ejection fraction, by estimation, is <20%. The left  ventricle has severely decreased function. The left ventricle demonstrates  global hypokinesis. The left ventricular internal cavity size was  moderately dilated. Left ventricular  diastolic parameters are consistent with Grade II diastolic dysfunction  (pseudonormalization). Elevated left atrial pressure. There is the  interventricular septum is flattened in diastole ('D' shaped left  ventricle), consistent with right ventricular  volume overload.   2. Right ventricular systolic function is moderately reduced. The right  ventricular size is moderately enlarged.   3. Left atrial size was mildly dilated.   4. Right atrial size was mildly dilated.   5. The mitral valve is normal in structure. Trivial mitral valve  regurgitation.   6. The tricuspid valve is abnormal. Tricuspid valve regurgitation  is  severe.   7. The aortic valve is tricuspid. Aortic valve regurgitation is not  visualized. No aortic stenosis is present.   Patient Profile     60 y.o. male with a hx of (HFrEF) heart failure with reduced ejection fraction, non-ischemic cardiomyopathy, hypertension, diabetes mellitus, hyperlipidemia, OSA, obesity who is being seen for the evaluation of congestive heart failure   Assessment & Plan    Acute on chronic combined systolic and diastolic heart failure: non-ischemic cardiomyopathy.  NYHA IV.  Pt has biventricular failure with EF < 20 and presents with volume overload.  He has had issues with non-adherence in the past and has stopped Entresto, Spironolactone, hydralazine/nitrates.  Lactate elevated on admission, has normalized.   -Continue diuresis with IV lasix 80 mg BID -Continue aldactone 12.5 mg daily -Will add coreg 3.125 mg BID.  Continue to fold in GDMT as tolerated  Hypertension: on Carvedilol at home.  Will restart coreg today.  Added aldactone as above.   NSVT: continue to monitor.  16 beat run of NSVT on admission.  His K+ was low (2.8) and may be the cause.  Maintain K>4, Mag>2   For questions or updates, please contact [facility] Please consult [email or web address] for contact info under        Signed, Little Ishikawa, MD  09/26/2020, 8:45 AM

## 2020-09-26 NOTE — Progress Notes (Signed)
Physical Therapy Treatment Patient Details Name: Kevin Long MRN: [id number] DOB: 1960-05-21 Today's Date: 09/26/2020    History of Present Illness Pt is a 60 y.o. male admitted on 09/21/20 for severe volume overload. Workup for acute respiratory failure without hypoxia secondary to CHF exacerbation. PMH includes fall, PE, DM2, HTN, morbid obesity, gout, OSA, PVCs, vertigo, foot sx.   PT Comments    Pt progressing with mobility. Pt independent with transfers and ambulation without DME, declined additional stair training after yesterday's session. Pt demonstrates noted improvements in activity tolerance and stability. Reviewed educ re: activity pacing, importance of mobility and activity recommendations, including more frequent OOB activity while admitted. Pt has met short-term acute PT goals, reports no further questions or concerns. Will d/c acute PT.    Follow Up Recommendations  No PT follow up     Equipment Recommendations  None recommended by PT    Recommendations for Other Services       Precautions / Restrictions Precautions Precautions: Fall;Other (comment) Precaution Comments: Urinary frequency; scrotal swelilng (pt reports improved 6/29) Restrictions Weight Bearing Restrictions: No    Mobility  Bed Mobility Overal bed mobility: Modified Independent             General bed mobility comments: HOB slightly elevated    Transfers Overall transfer level: Independent Equipment used: None Transfers: Sit to/from Stand              Ambulation/Gait Ambulation/Gait assistance: Independent Social research officer, government (Feet): 240 Feet Assistive device: None   Gait velocity: Decreased   General Gait Details: Slow, steady gait, independent without DME; 2x brief standing rest breaks secondary to SOB; no overt instability or LOB   Stairs Stairs:  (pt declined additional stair training)           Wheelchair Mobility    Modified Rankin (Stroke Patients Only)        Balance Overall balance assessment: Needs assistance Sitting-balance support: Feet supported;No upper extremity supported Sitting balance-Leahy Scale: Good     Standing balance support: No upper extremity supported;During functional activity Standing balance-Leahy Scale: Good               High level balance activites: Side stepping;Backward walking;Direction changes;Turns;Sudden stops;Head turns High Level Balance Comments: no overt instability or LOB with higher level balance tasks            Cognition Arousal/Alertness: Awake/alert Behavior During Therapy: WFL for tasks assessed/performed Overall Cognitive Status: Within Functional Limits for tasks assessed                                        Exercises      General Comments General comments (skin integrity, edema, etc.): SpO2 >/91% on RA with activity, HR 90s with ambulation. Reviewed educ re: activity recommendations (importance of more frequent OOB activity while admitted), walking program for home, activity pacing/energy conservation      Pertinent Vitals/Pain Pain Assessment: No/denies pain    Home Living                      Prior Function            PT Goals (current goals can now be found in the care plan section) Progress towards PT goals: Goals met/education completed, patient discharged from PT    Frequency    Min 3X/week      PT Plan Discharge  plan needs to be updated    Co-evaluation              AM-PAC PT "6 Clicks" Mobility   Outcome Measure  Help needed turning from your back to your side while in a flat bed without using bedrails?: None Help needed moving from lying on your back to sitting on the side of a flat bed without using bedrails?: None Help needed moving to and from a bed to a chair (including a wheelchair)?: None Help needed standing up from a chair using your arms (e.g., wheelchair or bedside chair)?: None Help needed to walk in  hospital room?: None Help needed climbing 3-5 steps with a railing? : A Little 6 Click Score: 23    End of Session   Activity Tolerance: Patient tolerated treatment well Patient left: in bed;with call bell/phone within reach Nurse Communication: Mobility status PT Visit Diagnosis: Muscle weakness (generalized) (M62.81);Difficulty in walking, not elsewhere classified (R26.2)     Time: 4471-5806 PT Time Calculation (min) (ACUTE ONLY): 21 min  Charges:  $Gait Training: 8-22 mins                     Mabeline Caras, PT, DPT Acute Rehabilitation Services  Pager [phone] Office [place] 09/26/2020, 5:10 PM

## 2020-09-26 NOTE — Progress Notes (Signed)
Heart Failure Stewardship Pharmacist Progress Note   PCP: Fleet Contras, MD PCP-Cardiologist: None    HPI:  60 yo M with PMH of HFrEF, NICM, HTN, T2DM, HLD, obesity, and OSA. He presented to the ED on 6/24 with shortness of breath and edema. CXR showed clear lungs and no evidence of pulmonary edema. An ECHO was done on 6/25 and LVEF was <20% with moderately reduced RV systolic function (stable from prior ECHO in 07/2019). Of note, patient has stopped taking Entresto and spironolactone in the past due to vertigo and has intolerance to BiDil (unclear what his reaction was to this).  Current HF Medications: Furosemide 80 mg IV BID Carvedilol 3.125 mg BID Spironolactone 12.5 mg daily  Prior to admission HF Medications: Furosemide 40 mg daily Carvedilol 12.5 mg BID  Pertinent Lab Values: Serum creatinine 1.24, BUN 9, Potassium 4.1, Sodium 138, BNP 1031.1, Magnesium 1.8 Hgb A1c 7.1  Vital Signs: Weight: 258 lbs (admission weight: 272 lbs) Blood pressure: 110/70s  Heart rate: 70-90s   Medication Assistance / Insurance Benefits Check: Does the patient have prescription insurance?  Yes Type of insurance plan: [place] Medicaid  Outpatient Pharmacy:  Prior to admission outpatient pharmacy: [facility] Is the patient willing to use [facility] TOC pharmacy at discharge? Yes Is the patient willing to transition their outpatient pharmacy to utilize a [facility] outpatient pharmacy?   Pending    Assessment: 1. Acute on chronic systolic CHF (EF <09%), due to NICM. NYHA class IV symptoms. - Continue furosemide 80 mg IV BID. Response has improved with increased dose. Down 8 lbs from yesterday and net -12.5L since admission. - Agree with restarting carvedilol 3.125 mg BID - Consider starting losartan tomorrow. Noted intolerance to Entresto with dizziness/vertigo - Continue spironolactone 12.5 mg daily  - Consider starting Farxiga 10 mg daily prior to discharge  Plan: 1) Medication changes recommended at  this time: - Continue IV diuresis; may be able to start losartan or Farxiga tomorrow  2) Patient assistance: - Marcelline Deist requires prior authorization - if this is added, I can complete the PA prior to discharge - Patient has  Medicaid (copays $0-3 each per month) - HF TOC appt made for 7/7  3)  Education  - To be completed prior to discharge  Sharen Hones, PharmD, BCPS Heart Failure Stewardship Pharmacist Phone [phone]

## 2020-09-27 ENCOUNTER — Other Ambulatory Visit (HOSPITAL_COMMUNITY): Payer: Self-pay

## 2020-09-27 LAB — COMPREHENSIVE METABOLIC PANEL
ALT: 12 U/L (ref 0–44)
AST: 21 U/L (ref 15–41)
Albumin: 3.5 g/dL (ref 3.5–5.0)
Alkaline Phosphatase: 142 U/L — ABNORMAL HIGH (ref 38–126)
Anion gap: 9 (ref 5–15)
BUN: 9 mg/dL (ref 8–23)
CO2: 30 mmol/L (ref 22–32)
Calcium: 9.5 mg/dL (ref 8.9–10.3)
Chloride: 97 mmol/L — ABNORMAL LOW (ref 98–111)
Creatinine, Ser: 1.36 mg/dL — ABNORMAL HIGH (ref 0.61–1.24)
GFR, Estimated: 59 mL/min — ABNORMAL LOW (ref >60)
Glucose, Bld: 163 mg/dL — ABNORMAL HIGH (ref 70–99)
Potassium: 3.8 mmol/L (ref 3.5–5.1)
Sodium: 136 mmol/L (ref 135–145)
Total Bilirubin: 2.8 mg/dL — ABNORMAL HIGH (ref 0.3–1.2)
Total Protein: 6.9 g/dL (ref 6.5–8.1)

## 2020-09-27 LAB — GLUCOSE, CAPILLARY
Glucose-Capillary: 110 mg/dL — ABNORMAL HIGH (ref 70–99)
Glucose-Capillary: 139 mg/dL — ABNORMAL HIGH (ref 70–99)
Glucose-Capillary: 94 mg/dL (ref 70–99)
Glucose-Capillary: 141 mg/dL — ABNORMAL HIGH (ref 70–99)
Glucose-Capillary: 116 mg/dL — ABNORMAL HIGH (ref 70–99)
Glucose-Capillary: 162 mg/dL — ABNORMAL HIGH (ref 70–99)
Glucose-Capillary: 171 mg/dL — ABNORMAL HIGH (ref 70–99)
Glucose-Capillary: 97 mg/dL (ref 70–99)
Glucose-Capillary: 109 mg/dL — ABNORMAL HIGH (ref 70–99)

## 2020-09-27 LAB — MAGNESIUM: Magnesium: 1.9 mg/dL (ref 1.7–2.4)

## 2020-09-27 LAB — CBC
HCT: 30.7 % — ABNORMAL LOW (ref 39.0–52.0)
Hemoglobin: 10.4 g/dL — ABNORMAL LOW (ref 13.0–17.0)
MCH: 29.6 pg (ref 26.0–34.0)
MCHC: 33.9 g/dL (ref 30.0–36.0)
MCV: 87.5 fL (ref 80.0–100.0)
Platelets: 288 10*3/uL (ref 150–400)
RBC: 3.51 MIL/uL — ABNORMAL LOW (ref 4.22–5.81)
RDW: 17.1 % — ABNORMAL HIGH (ref 11.5–15.5)
WBC: 8.3 10*3/uL (ref 4.0–10.5)
nRBC: 0 % (ref 0.0–0.2)

## 2020-09-27 MED ORDER — ENOXAPARIN SODIUM 60 MG/0.6ML IJ SOSY
55.0000 mg | PREFILLED_SYRINGE | INTRAMUSCULAR | Status: DC
  Administered 2020-09-27: 55 mg via SUBCUTANEOUS
  Filled 2020-09-27: qty 0.6

## 2020-09-27 MED ORDER — DAPAGLIFLOZIN PROPANEDIOL 10 MG PO TABS
10.0000 mg | ORAL_TABLET | Freq: Every day | ORAL | Status: DC
  Administered 2020-09-27 – 2020-09-28 (×2): 10 mg via ORAL
  Filled 2020-09-27 (×2): qty 1

## 2020-09-27 MED ORDER — LOSARTAN POTASSIUM 25 MG PO TABS
25.0000 mg | ORAL_TABLET | Freq: Every day | ORAL | Status: DC
  Administered 2020-09-27 – 2020-09-28 (×2): 25 mg via ORAL
  Filled 2020-09-27 (×2): qty 1

## 2020-09-27 MED ORDER — TORSEMIDE 20 MG PO TABS
20.0000 mg | ORAL_TABLET | Freq: Every day | ORAL | Status: DC
  Administered 2020-09-27 – 2020-09-28 (×2): 20 mg via ORAL
  Filled 2020-09-27 (×2): qty 1

## 2020-09-27 MED ORDER — POTASSIUM CHLORIDE CRYS ER 20 MEQ PO TBCR
20.0000 meq | EXTENDED_RELEASE_TABLET | Freq: Every day | ORAL | Status: DC
  Administered 2020-09-28: 20 meq via ORAL
  Filled 2020-09-27: qty 1

## 2020-09-27 NOTE — Progress Notes (Signed)
PROGRESS NOTE    Corbitt Cloke  LFY:[id number] DOB: 01-03-1961 DOA: 09/21/2020 PCP: Fleet Contras, MD    Brief Narrative:  60 year old with a history of PVCs, systolic CHF with EF 20-25%, DM2, HTN, OSA, PE, and obesity who was sent to the ED after being seen by his Cardiologist and found to be in severe volume overload.  Assessment & Plan:   Principal Problem:   Acute exacerbation of CHF (congestive heart failure) (HCC) Active Problems:   Hyperlipidemia   OBSTRUCTIVE SLEEP APNEA   Essential hypertension   Uncontrolled type 2 diabetes mellitus with hyperglycemia, with long-term current use of insulin (HCC)   Anasarca   Pulmonary embolism associated with COVID-19 (HCC)  Acute respiratory failure without hypoxia secondary to acute exacerbation of chronic systolic heart failure - TTE May 2021 with EF <20% and severely reduced RV systolic function  -Continue diuretics -Cont to encourage good dietary, lifestyle, medication compliance -Continue I's and O's, fluid restriction, salt restriction, diuresing well over the past 24 hours -discussed with Cardiology today. Recommendation for: Aldactone 12.5mg  qday Coreg 3.125mg  BID Losartan 25mg  qday 10mg  qday   Hypokalemia -currently normalized   Elevated total bilirubin -Felt to be due to passive hepatic congestion in setting of severe CHF and RV failure -Stable   HTN Continue current medications -BP stable at this time   Uncontrolled DM2 with hyperglycemia A1c 7.1  Continue sliding scale insulin -glucose trends reviewed, stable   Normocytic anemia, likely iron deficiency with concurrent anemia of chronic disease Hemoglobin stable, discussed increasing p.o. iron intake   History of PE Has completed his prescribed treatment course of Eliquis -not currently on this medication Remains on minimal O2 support   HLD Does not appear to be on statin currently, discussed need for dietary and lifestyle modifications    OSA Does not use CPAP at home -uses nocturnal nasal cannula oxygen   Morbid obesity - Body mass index is 42.93 kg/m.    DVT prophylaxis: lovenox subq Code Status: Full Family Communication: Pt in room, family not at bedside  Status is: Inpatient  Remains inpatient appropriate because:Inpatient level of care appropriate due to severity of illness  Dispo: The patient is from: Home              Anticipated d/c is to: Home              Patient currently is not medically stable to d/c.   Difficult to place patient No   Consultants:  Cardiology  Procedures:    Antimicrobials: Anti-infectives (From admission, onward)    Start     Dose/Rate Route Frequency Ordered Stop   09/21/20 2200  cephALEXin (KEFLEX) capsule 500 mg  Status:  Discontinued        500 mg Oral 2 times daily 09/21/20 1815 09/21/20 1839       Subjective: Eager to go home soon  Objective: Vitals:   09/26/20 2031 09/27/20 0430 09/27/20 0800 09/27/20 1205  BP: 113/69 112/63  106/61  Pulse: 91   73  Resp: 17 20 17 20   Temp: 98.6 F (37 C) 98.8 F (37.1 C)  98.2 F (36.8 C)  TempSrc: Oral Oral  Oral  SpO2: 95% 96%  97%  Weight:  112.5 kg    Height:        Intake/Output Summary (Last 24 hours) at 09/27/2020 1627 Last data filed at 09/27/2020 0942 Gross per 24 hour  Intake 600 ml  Output 2025 ml  Net -1425 ml  Filed Weights   09/25/20 0531 09/26/20 0338 09/27/20 0430  Weight: 120.7 kg 117.1 kg 112.5 kg    Examination: General exam: Conversant, in no acute distress Respiratory system: normal chest rise, clear, no audible wheezing Cardiovascular system: regular rhythm, s1-s2 Gastrointestinal system: Nondistended, nontender, pos BS Central nervous system: No seizures, no tremors Extremities: No cyanosis, no joint deformities Skin: No rashes, no pallor Psychiatry: Affect normal // no auditory hallucinations   Data Reviewed: I have personally reviewed following labs and imaging  studies  CBC: Recent Labs  Lab 09/21/20 1117 09/23/20 0306 09/25/20 0216 09/27/20 0127  WBC 8.1 10.3 9.1 8.3  NEUTROABS 5.8  --   --   --   HGB 11.4* 11.1* 10.9* 10.4*  HCT 34.5* 33.4* 32.2* 30.7*  MCV 88.9 88.4 88.5 87.5  PLT 331 316 307 288    Basic Metabolic Panel: Recent Labs  Lab 09/23/20 0306 09/24/20 0311 09/25/20 0216 09/26/20 0325 09/27/20 0127  NA 138 136 137 138 136  K 3.8 3.8 3.7 4.1 3.8  CL 102 102 99 100 97*  CO2 27 25 29 28 30   GLUCOSE 111* 155* 125* 104* 163*  BUN 9 9 9 9 9   CREATININE 1.18 1.19 1.27* 1.24 1.36*  CALCIUM 9.0 8.7* 9.2 9.4 9.5  MG 1.9 1.8 1.8 1.8 1.9    GFR: Estimated Creatinine Clearance: 67.2 mL/min (A) (by C-G formula based on SCr of 1.36 mg/dL (H)). Liver Function Tests: Recent Labs  Lab 09/22/20 0213 09/23/20 0306 09/24/20 0311 09/25/20 0216 09/27/20 0127  AST 24 24 28 21 21   ALT 13 12 10 11 12   ALKPHOS 107 110 114 121 142*  BILITOT 2.8* 3.5* 2.8* 2.4* 2.8*  PROT 6.7 6.9 6.7 6.6 6.9  ALBUMIN 3.5 3.7 3.5 3.5 3.5    Recent Labs  Lab 09/21/20 1117  LIPASE 53*    No results for input(s): AMMONIA in the last 168 hours. Coagulation Profile: Recent Labs  Lab 09/21/20 1117  INR 1.5*    Cardiac Enzymes: No results for input(s): CKTOTAL, CKMB, CKMBINDEX, TROPONINI in the last 168 hours. BNP (last 3 results) No results for input(s): PROBNP in the last 8760 hours. HbA1C: No results for input(s): HGBA1C in the last 72 hours. CBG: Recent Labs  Lab 09/26/20 1140 09/26/20 1628 09/26/20 2114 09/27/20 0735 09/27/20 1136  GLUCAP 94 141* 116* 162* 171*    Lipid Profile: Recent Labs    09/25/20 0216  CHOL 157  HDL 38*  LDLCALC 106*  TRIG 67  CHOLHDL 4.1    Thyroid Function Tests: No results for input(s): TSH, T4TOTAL, FREET4, T3FREE, THYROIDAB in the last 72 hours. Anemia Panel: Recent Labs    09/25/20 0216  VITAMINB12 441  FOLATE 13.4  FERRITIN 104  TIBC 288  IRON 34*  RETICCTPCT 2.4     Sepsis Labs: Recent Labs  Lab 09/23/20 1409 09/23/20 1628 09/24/20 0311  LATICACIDVEN 2.5* 2.7* 1.4     Recent Results (from the past 240 hour(s))  Urine culture     Status: Abnormal   Collection Time: 09/17/20  8:41 PM   Specimen: Urine, Random  Result Value Ref Range Status   Specimen Description   Final    URINE, RANDOM Performed at [facility], [address], [place], [place]    Special Requests   Final    NONE Performed at [facility], [address] [place], [place]    Culture (A)  Final    <  10,000 COLONIES/mL INSIGNIFICANT GROWTH Performed at [facility], [address] [place], [place] [address]    Report Status 09/19/2020 FINAL  Final  Resp Panel by RT-PCR (Flu A&B, Covid) Nasopharyngeal Swab     Status: None   Collection Time: 09/21/20 11:17 AM   Specimen: Nasopharyngeal Swab; Nasopharyngeal(NP) swabs in vial transport medium  Result Value Ref Range Status   SARS Coronavirus 2 by RT PCR NEGATIVE NEGATIVE Final    Comment: (NOTE) SARS-CoV-2 target nucleic acids are NOT DETECTED.  The SARS-CoV-2 RNA is generally detectable in upper respiratory specimens during the acute phase of infection. The lowest concentration of SARS-CoV-2 viral copies this assay can detect is 138 copies/mL. A negative result does not preclude SARS-Cov-2 infection and should not be used as the sole basis for treatment or other patient management decisions. A negative result may occur with  improper specimen collection/handling, submission of specimen other than nasopharyngeal swab, presence of viral mutation(s) within the areas targeted by this assay, and inadequate number of viral copies(<138 copies/mL). A negative result must be combined with clinical observations, patient history, and epidemiological information. The expected result is Negative.  Fact Sheet for Patients:   BloggerCourse.com  Fact Sheet for Healthcare Providers:  SeriousBroker.it  This test is no t yet approved or cleared by the [place] FDA and  has been authorized for detection and/or diagnosis of SARS-CoV-2 by FDA under an Emergency Use Authorization (EUA). This EUA will remain  in effect (meaning this test can be used) for the duration of the COVID-19 declaration under Section 564(b)(1) of the Act, 21 U.S.C.section 360bbb-3(b)(1), unless the authorization is terminated  or revoked sooner.       Influenza A by PCR NEGATIVE NEGATIVE Final   Influenza B by PCR NEGATIVE NEGATIVE Final    Comment: (NOTE) The Xpert Xpress SARS-CoV-2/FLU/RSV plus assay is intended as an aid in the diagnosis of influenza from Nasopharyngeal swab specimens and should not be used as a sole basis for treatment. Nasal washings and aspirates are unacceptable for Xpert Xpress SARS-CoV-2/FLU/RSV testing.  Fact Sheet for Patients: BloggerCourse.com  Fact Sheet for Healthcare Providers: SeriousBroker.it  This test is not yet approved or cleared by the [place] FDA and has been authorized for detection and/or diagnosis of SARS-CoV-2 by FDA under an Emergency Use Authorization (EUA). This EUA will remain in effect (meaning this test can be used) for the duration of the COVID-19 declaration under Section 564(b)(1) of the Act, 21 U.S.C. section 360bbb-3(b)(1), unless the authorization is terminated or revoked.  Performed at [facility], [address] [place], [place] [address]       Radiology Studies: No results found.  Scheduled Meds:  aspirin EC  81 mg Oral Daily   carvedilol  3.125 mg Oral BID WC   dapagliflozin propanediol  10 mg Oral Daily   enoxaparin (LOVENOX) injection  55 mg Subcutaneous Q24H   insulin aspart  0-20 Units Subcutaneous TID WC   losartan  25 mg Oral Daily    magnesium oxide  400 mg Oral BID   [START ON 09/28/2020] potassium chloride  20 mEq Oral Daily   sodium chloride flush  3 mL Intravenous Q12H   spironolactone  12.5 mg Oral Daily   torsemide  20 mg Oral Daily   Vitamin D (Ergocalciferol)  50,000 Units Oral Q Mon   Continuous Infusions:  sodium chloride       LOS: 6 days   Rickey Barbara, MD [facility] Pager On Amion  If 7PM-7AM, please contact night-coverage 09/27/2020, 4:27 PM

## 2020-09-27 NOTE — Progress Notes (Signed)
Progress Note  Patient Name: Kevin Long Date of Encounter: 09/27/2020  [facility] Cardiologist: None   Subjective   Net -4.2 L on IV Lasix 80 mg twice daily yesterday, -15.5 L on admission.  BP 112/63.  Renal function mildly increased (1.2 > 1.2 > 1.3 > 1.2 >1.4).  Weight down 10lbs from yesterday, down 26 lbs total.  Denies any dyspnea or chest pain  Inpatient Medications    Scheduled Meds:  aspirin EC  81 mg Oral Daily   carvedilol  3.125 mg Oral BID WC   enoxaparin (LOVENOX) injection  55 mg Subcutaneous Q24H   insulin aspart  0-20 Units Subcutaneous TID WC   magnesium oxide  400 mg Oral BID   potassium chloride  40 mEq Oral BID   sodium chloride flush  3 mL Intravenous Q12H   spironolactone  12.5 mg Oral Daily   Vitamin D (Ergocalciferol)  50,000 Units Oral Q Mon   Continuous Infusions:  sodium chloride     PRN Meds: sodium chloride, acetaminophen, albuterol, gabapentin, linaclotide, sodium chloride flush   Vital Signs    Vitals:   09/26/20 0338 09/26/20 1630 09/26/20 2031 09/27/20 0430  BP: 118/68 112/62 113/69 112/63  Pulse: 88 79 91   Resp: 16 17 17 20   Temp: 98.5 F (36.9 C) 98.7 F (37.1 C) 98.6 F (37 C) 98.8 F (37.1 C)  TempSrc: Oral Oral Oral Oral  SpO2: 94% 94% 95% 96%  Weight: 117.1 kg   112.5 kg  Height:        Intake/Output Summary (Last 24 hours) at 09/27/2020 1005 Last data filed at 09/27/2020 0942 Gross per 24 hour  Intake 600 ml  Output 4050 ml  Net -3450 ml    Last 3 Weights 09/27/2020 09/26/2020 09/25/2020  Weight (lbs) 248 lb 258 lb 1.6 oz 266 lb  Weight (kg) 112.492 kg 117.073 kg 120.657 kg      Telemetry    NSR - Personally Reviewed  ECG    No new ECG - Personally Reviewed  Physical Exam   GEN: No acute distress.   Neck: + JVD Cardiac: RRR, 2/6 systolic murmur, distant heart sounds Respiratory: Clear to auscultation bilaterally. GI: Nondistended MS: Trace edema Neuro:  Nonfocal  Psych: Normal affect   Labs     High Sensitivity Troponin:  No results for input(s): TROPONINIHS in the last 720 hours.    Chemistry Recent Labs  Lab 09/24/20 0311 09/25/20 0216 09/26/20 0325 09/27/20 0127  NA 136 137 138 136  K 3.8 3.7 4.1 3.8  CL 102 99 100 97*  CO2 25 29 28 30   GLUCOSE 155* 125* 104* 163*  BUN 9 9 9 9   CREATININE 1.19 1.27* 1.24 1.36*  CALCIUM 8.7* 9.2 9.4 9.5  PROT 6.7 6.6  --  6.9  ALBUMIN 3.5 3.5  --  3.5  AST 28 21  --  21  ALT 10 11  --  12  ALKPHOS 114 121  --  142*  BILITOT 2.8* 2.4*  --  2.8*  GFRNONAA >60 >60 >60 59*  ANIONGAP 9 9 10 9       Hematology Recent Labs  Lab 09/23/20 0306 09/25/20 0216 09/27/20 0127  WBC 10.3 9.1 8.3  RBC 3.78* 3.64*  3.73* 3.51*  HGB 11.1* 10.9* 10.4*  HCT 33.4* 32.2* 30.7*  MCV 88.4 88.5 87.5  MCH 29.4 29.9 29.6  MCHC 33.2 33.9 33.9  RDW 17.1* 17.2* 17.1*  PLT 316 307 288  BNP Recent Labs  Lab 09/21/20 1117  BNP 1,031.1*      DDimer No results for input(s): DDIMER in the last 168 hours.   Radiology    No results found.  Cardiac Studies   Echo 6/25:  1. Left ventricular ejection fraction, by estimation, is <20%. The left  ventricle has severely decreased function. The left ventricle demonstrates  global hypokinesis. The left ventricular internal cavity size was  moderately dilated. Left ventricular  diastolic parameters are consistent with Grade II diastolic dysfunction  (pseudonormalization). Elevated left atrial pressure. There is the  interventricular septum is flattened in diastole ('D' shaped left  ventricle), consistent with right ventricular  volume overload.   2. Right ventricular systolic function is moderately reduced. The right  ventricular size is moderately enlarged.   3. Left atrial size was mildly dilated.   4. Right atrial size was mildly dilated.   5. The mitral valve is normal in structure. Trivial mitral valve  regurgitation.   6. The tricuspid valve is abnormal. Tricuspid valve  regurgitation is  severe.   7. The aortic valve is tricuspid. Aortic valve regurgitation is not  visualized. No aortic stenosis is present.   Patient Profile     60 y.o. male with a hx of (HFrEF) heart failure with reduced ejection fraction, non-ischemic cardiomyopathy, hypertension, diabetes mellitus, hyperlipidemia, OSA, obesity who is being seen for the evaluation of congestive heart failure   Assessment & Plan    Acute on chronic combined systolic and diastolic heart failure: non-ischemic cardiomyopathy.  NYHA IV.  Pt has biventricular failure with EF < 20 and presents with volume overload.  He has had issues with non-adherence in the past and has stopped Entresto, Spironolactone, hydralazine/nitrates.  Lactate elevated on admission, has normalized.    -Mild bump in Cr.  He appears euvolemic except for elevated JVD, which is likely due to his severe TR.  Will transition from IV lasix to PO torsemide -Continue aldactone 12.5 mg daily -Continue coreg 3.125 mg BID.  -Add losartan 25 mg daily -Add farxiga 10 mg daily  Hypertension: stable, meds as above   NSVT: continue to monitor.  16 beat run of NSVT on admission.  His K+ was low (2.8) and may be the cause.  Maintain K>4, Mag>2.  Has not had further episodes    For questions or updates, please contact [facility] Please consult [email or web address] for contact info under        Signed, Little Ishikawa, MD  09/27/2020, 10:05 AM

## 2020-09-27 NOTE — Progress Notes (Signed)
Heart Failure Patient Advocate Encounter   Received notification from [employer] that prior authorization for Kevin Long is required.   PA submitted on CoverMyMeds Key [id number] Status is pending   Will continue to follow.  Sharen Hones, PharmD, BCPS Heart Failure Stewardship Pharmacist Phone [phone]  Please check [email or web address] for unit-specific pharmacist phone numbers

## 2020-09-27 NOTE — Plan of Care (Signed)
  Problem: Education: Goal: Ability to verbalize understanding of medication therapies will improve Outcome: Progressing   Problem: Cardiac: Goal: Ability to achieve and maintain adequate cardiopulmonary perfusion will improve Outcome: Progressing   

## 2020-09-27 NOTE — Plan of Care (Signed)
  Problem: Education: Goal: Ability to verbalize understanding of medication therapies will improve Outcome: Progressing   

## 2020-09-27 NOTE — Progress Notes (Signed)
Heart Failure Stewardship Pharmacist Progress Note   PCP: Fleet Contras, MD PCP-Cardiologist: None    HPI:  60 yo M with PMH of HFrEF, NICM, HTN, T2DM, HLD, obesity, and OSA. He presented to the ED on 6/24 with shortness of breath and edema. CXR showed clear lungs and no evidence of pulmonary edema. An ECHO was done on 6/25 and LVEF was <20% with moderately reduced RV systolic function (stable from prior ECHO in 07/2019). Of note, patient has stopped taking Entresto and spironolactone in the past due to vertigo and has intolerance to BiDil (unclear what his reaction was to this).  Current HF Medications: Torsemide 20 mg daily Carvedilol 3.125 mg BID Losartan 25 mg daily Spironolactone 12.5 mg daily Farxiga 10 mg daily  Prior to admission HF Medications: Furosemide 40 mg daily Carvedilol 12.5 mg BID  Pertinent Lab Values: Serum creatinine 1.36, BUN 9, Potassium 3.8, Sodium 136, BNP 1031.1, Magnesium 1.9 Hgb A1c 7.1  Vital Signs: Weight: 248 lbs (admission weight: 272 lbs) Blood pressure: 110/60s  Heart rate: 70-80s   Medication Assistance / Insurance Benefits Check: Does the patient have prescription insurance?  Yes Type of insurance plan: [place] Medicaid  Outpatient Pharmacy:  Prior to admission outpatient pharmacy: [facility] Is the patient willing to use [facility] TOC pharmacy at discharge? Yes Is the patient willing to transition their outpatient pharmacy to utilize a [facility] outpatient pharmacy?   Pending    Assessment: 1. Acute on chronic systolic CHF (EF <07%), due to NICM. NYHA class IV symptoms. - Agree with transitioning to torsemide 20 mg daily. Net negative 15L since admission - Continue carvedilol 3.125 mg BID - Agree with starting losartan. Noted intolerance to Entresto with dizziness/vertigo - Continue spironolactone 12.5 mg daily  - Agree with starting Farxiga 10 mg daily  Plan: 1) Medication changes recommended at this time: - Agree with changes as above  2)  Patient assistance: - Marcelline Deist requires prior authorization - will complete today - Patient has [place] Medicaid (copays $0-3 each per month) - HF TOC appt made for 7/7  3)  Education  - To be completed prior to discharge  Sharen Hones, PharmD, BCPS Heart Failure Stewardship Pharmacist Phone [phone]

## 2020-09-28 ENCOUNTER — Other Ambulatory Visit (HOSPITAL_COMMUNITY): Payer: Self-pay

## 2020-09-28 LAB — BASIC METABOLIC PANEL
Anion gap: 11 (ref 5–15)
BUN: 11 mg/dL (ref 8–23)
CO2: 28 mmol/L (ref 22–32)
Calcium: 9.3 mg/dL (ref 8.9–10.3)
Chloride: 99 mmol/L (ref 98–111)
Creatinine, Ser: 1.33 mg/dL — ABNORMAL HIGH (ref 0.61–1.24)
GFR, Estimated: >60 mL/min (ref >60)
Glucose, Bld: 108 mg/dL — ABNORMAL HIGH (ref 70–99)
Potassium: 4.1 mmol/L (ref 3.5–5.1)
Sodium: 138 mmol/L (ref 135–145)

## 2020-09-28 LAB — GLUCOSE, CAPILLARY
Glucose-Capillary: 148 mg/dL — ABNORMAL HIGH (ref 70–99)
Glucose-Capillary: 113 mg/dL — ABNORMAL HIGH (ref 70–99)

## 2020-09-28 LAB — MAGNESIUM: Magnesium: 2.1 mg/dL (ref 1.7–2.4)

## 2020-09-28 MED ORDER — SPIRONOLACTONE 25 MG PO TABS
12.5000 mg | ORAL_TABLET | Freq: Every day | ORAL | 0 refills | Status: DC
  Filled 2020-09-28: qty 15, 30d supply, fill #0

## 2020-09-28 MED ORDER — MAGNESIUM OXIDE -MG SUPPLEMENT 400 (240 MG) MG PO TABS
400.0000 mg | ORAL_TABLET | Freq: Every day | ORAL | Status: DC
  Administered 2020-09-28: 400 mg via ORAL
  Filled 2020-09-28: qty 1

## 2020-09-28 MED ORDER — CARVEDILOL 3.125 MG PO TABS
3.1250 mg | ORAL_TABLET | Freq: Two times a day (BID) | ORAL | 0 refills | Status: DC
  Filled 2020-09-28: qty 60, 30d supply, fill #0

## 2020-09-28 MED ORDER — DAPAGLIFLOZIN PROPANEDIOL 10 MG PO TABS
10.0000 mg | ORAL_TABLET | Freq: Every day | ORAL | 0 refills | Status: DC
  Filled 2020-09-28: qty 30, 30d supply, fill #0

## 2020-09-28 MED ORDER — GABAPENTIN 100 MG PO CAPS
100.0000 mg | ORAL_CAPSULE | Freq: Every evening | ORAL | 0 refills | Status: DC | PRN
  Filled 2020-09-28: qty 30, 30d supply, fill #0

## 2020-09-28 MED ORDER — LOSARTAN POTASSIUM 25 MG PO TABS
25.0000 mg | ORAL_TABLET | Freq: Every day | ORAL | 0 refills | Status: DC
Start: 2020-09-29 — End: 2020-10-24
  Filled 2020-09-28: qty 30, 30d supply, fill #0

## 2020-09-28 MED ORDER — TORSEMIDE 20 MG PO TABS
20.0000 mg | ORAL_TABLET | Freq: Every day | ORAL | 0 refills | Status: DC
Start: 2020-09-29 — End: 2020-10-04
  Filled 2020-09-28: qty 30, 30d supply, fill #0

## 2020-09-28 NOTE — Progress Notes (Signed)
Occupational Therapy Treatment Patient Details Name: Kevin Long MRN: [id number] DOB: 23-Jul-1960 Today's Date: 09/28/2020    History of present illness Pt is a 60 y.o. male admitted on 09/21/20 for severe volume overload. Workup for acute respiratory failure without hypoxia secondary to CHF exacerbation. PMH includes fall, PE, DM2, HTN, morbid obesity, gout, OSA, PVCs, vertigo, foot sx.   OT comments  Pt making steady progress towards OT goals this session. Session focus on reapplication of scrotal sling as pt reports last sling got wet from urinary frequency. Donned scrotal sling with pt able to stand ~ 3 mins with no UE support. Education provided to pt and pts wife on wear and care of scrotal sling with both reporting understanding. Pt reports he has been walking in halls and is hopeful to be DC'ed today, will follow acutely per POC for OT needs.    Follow Up Recommendations  Home health OT;Supervision - Intermittent    Equipment Recommendations  Tub/shower bench    Recommendations for Other Services      Precautions / Restrictions Precautions Precautions: Fall;Other (comment) Precaution Comments: Urinary frequency; scrotal swelilng ( scrotal sling 7/1) Restrictions Weight Bearing Restrictions: No       Mobility Bed Mobility Overal bed mobility: Modified Independent             General bed mobility comments: HOB slightly elevated    Transfers Overall transfer level: Needs assistance Equipment used: None Transfers: Sit to/from Stand Sit to Stand: Supervision         General transfer comment: standing from EOB with no physical assist, supervision for safety    Balance Overall balance assessment: Needs assistance Sitting-balance support: Feet supported;No upper extremity supported Sitting balance-Leahy Scale: Good     Standing balance support: No upper extremity supported;During functional activity Standing balance-Leahy Scale: Good Standing balance comment:  standing during donning of scrotal sling, able to stand ~ 5 mins with no UE support                           ADL either performed or assessed with clinical judgement   ADL Overall ADL's : At baseline                         Toilet Transfer: Radiographer, therapeutic Details (indicate cue type and reason): simulated via functional mobility         Functional mobility during ADLs: Supervision/safety General ADL Comments: session focus on reapplication of scrotal sling, manufactured more durable sling that can be washed as pt reports getting previous sling wet     Vision       Perception     Praxis      Cognition Arousal/Alertness: Awake/alert Behavior During Therapy: WFL for tasks assessed/performed Overall Cognitive Status: Within Functional Limits for tasks assessed                                          Exercises     Shoulder Instructions       General Comments on RA with VSS, pts wife present during session. reapplied new scrotal sling, education provided on donning<>doffing and how to care for sling i.e washing etc    Pertinent Vitals/ Pain       Pain Assessment: No/denies pain  Home Living  Prior Functioning/Environment              Frequency  Min 2X/week        Progress Toward Goals  OT Goals(current goals can now be found in the care plan section)  Progress towards OT goals: Progressing toward goals  Acute Rehab OT Goals Patient Stated Goal: hoping to go home today OT Goal Formulation: With patient Time For Goal Achievement: 10/07/20 Potential to Achieve Goals: Good  Plan Discharge plan remains appropriate;Frequency remains appropriate    Co-evaluation                 AM-PAC OT "6 Clicks" Daily Activity     Outcome Measure   Help from another person eating meals?: None Help from another person taking care of personal  grooming?: None Help from another person toileting, which includes using toliet, bedpan, or urinal?: None Help from another person bathing (including washing, rinsing, drying)?: A Little Help from another person to put on and taking off regular upper body clothing?: None Help from another person to put on and taking off regular lower body clothing?: A Little 6 Click Score: 22    End of Session    OT Visit Diagnosis: Unsteadiness on feet (R26.81);Other abnormalities of gait and mobility (R26.89);Muscle weakness (generalized) (M62.81)   Activity Tolerance Patient tolerated treatment well   Patient Left in bed;with call bell/phone within reach;with family/visitor present   Nurse Communication Mobility status;Other (comment) (reapplied new scrotal sling)        Time: 7106-2694 OT Time Calculation (min): 10 min  Charges: OT General Charges $OT Visit: 1 Visit OT Treatments $Self Care/Home Management : 8-22 mins  Lenor Derrick., COTA/L Acute Rehabilitation Services [phone]    Barron Schmid 09/28/2020, 11:48 AM

## 2020-09-28 NOTE — Progress Notes (Addendum)
Progress Note  Patient Name: Kevin Long Date of Encounter: 09/28/2020  [facility] Cardiologist: None   Subjective   Net -1.2 L on torsemide 20 mg daily yesterday, -16.7 L on admission.  BP 122/79.  Renal function stable (1.2 > 1.2 > 1.3 > 1.2 >1.4 >1.3).  Weight down 1lb from yesterday, down 27 lbs total.  Denies any dyspnea or chest pain  Inpatient Medications    Scheduled Meds:  aspirin EC  81 mg Oral Daily   carvedilol  3.125 mg Oral BID WC   dapagliflozin propanediol  10 mg Oral Daily   enoxaparin (LOVENOX) injection  55 mg Subcutaneous Q24H   insulin aspart  0-20 Units Subcutaneous TID WC   losartan  25 mg Oral Daily   magnesium oxide  400 mg Oral Daily   potassium chloride  20 mEq Oral Daily   sodium chloride flush  3 mL Intravenous Q12H   spironolactone  12.5 mg Oral Daily   torsemide  20 mg Oral Daily   Vitamin D (Ergocalciferol)  50,000 Units Oral Q Mon   Continuous Infusions:  sodium chloride     PRN Meds: sodium chloride, acetaminophen, albuterol, gabapentin, linaclotide, sodium chloride flush   Vital Signs    Vitals:   09/27/20 1712 09/27/20 2121 09/28/20 0552 09/28/20 0553  BP: 115/71 113/62  122/79  Pulse: 79 79  95  Resp: 20 19  17   Temp: 98.2 F (36.8 C) 98.2 F (36.8 C)  99 F (37.2 C)  TempSrc: Oral Oral  Oral  SpO2: 97% 98%  98%  Weight:   112 kg   Height:        Intake/Output Summary (Last 24 hours) at 09/28/2020 1019 Last data filed at 09/28/2020 0729 Gross per 24 hour  Intake 360 ml  Output 1850 ml  Net -1490 ml    Last 3 Weights 09/28/2020 09/27/2020 09/26/2020  Weight (lbs) 246 lb 14.4 oz 248 lb 258 lb 1.6 oz  Weight (kg) 111.993 kg 112.492 kg 117.073 kg      Telemetry    NSR - Personally Reviewed  ECG    No new ECG - Personally Reviewed  Physical Exam   GEN: No acute distress.   Neck: + JVD Cardiac: RRR, 2/6 systolic murmur, distant heart sounds Respiratory: Clear to auscultation bilaterally. GI: Nondistended MS:  Trace edema Neuro:  Nonfocal  Psych: Normal affect   Labs    High Sensitivity Troponin:  No results for input(s): TROPONINIHS in the last 720 hours.    Chemistry Recent Labs  Lab 09/24/20 0311 09/25/20 0216 09/26/20 0325 09/27/20 0127 09/28/20 0248  NA 136 137 138 136 138  K 3.8 3.7 4.1 3.8 4.1  CL 102 99 100 97* 99  CO2 25 29 28 30 28   GLUCOSE 155* 125* 104* 163* 108*  BUN 9 9 9 9 11   CREATININE 1.19 1.27* 1.24 1.36* 1.33*  CALCIUM 8.7* 9.2 9.4 9.5 9.3  PROT 6.7 6.6  --  6.9  --   ALBUMIN 3.5 3.5  --  3.5  --   AST 28 21  --  21  --   ALT 10 11  --  12  --   ALKPHOS 114 121  --  142*  --   BILITOT 2.8* 2.4*  --  2.8*  --   GFRNONAA >60 >60 >60 59* >60  ANIONGAP 9 9 10 9 11       Hematology Recent Labs  Lab 09/23/20 0306 09/25/20 0216 09/27/20  0127  WBC 10.3 9.1 8.3  RBC 3.78* 3.64*  3.73* 3.51*  HGB 11.1* 10.9* 10.4*  HCT 33.4* 32.2* 30.7*  MCV 88.4 88.5 87.5  MCH 29.4 29.9 29.6  MCHC 33.2 33.9 33.9  RDW 17.1* 17.2* 17.1*  PLT 316 307 288     BNP Recent Labs  Lab 09/21/20 1117  BNP 1,031.1*      DDimer No results for input(s): DDIMER in the last 168 hours.   Radiology    No results found.  Cardiac Studies   Echo 6/25:  1. Left ventricular ejection fraction, by estimation, is <20%. The left  ventricle has severely decreased function. The left ventricle demonstrates  global hypokinesis. The left ventricular internal cavity size was  moderately dilated. Left ventricular  diastolic parameters are consistent with Grade II diastolic dysfunction  (pseudonormalization). Elevated left atrial pressure. There is the  interventricular septum is flattened in diastole ('D' shaped left  ventricle), consistent with right ventricular  volume overload.   2. Right ventricular systolic function is moderately reduced. The right  ventricular size is moderately enlarged.   3. Left atrial size was mildly dilated.   4. Right atrial size was mildly dilated.    5. The mitral valve is normal in structure. Trivial mitral valve  regurgitation.   6. The tricuspid valve is abnormal. Tricuspid valve regurgitation is  severe.   7. The aortic valve is tricuspid. Aortic valve regurgitation is not  visualized. No aortic stenosis is present.   Patient Profile     60 y.o. male with a hx of (HFrEF) heart failure with reduced ejection fraction, non-ischemic cardiomyopathy, hypertension, diabetes mellitus, hyperlipidemia, OSA, obesity who is being seen for the evaluation of congestive heart failure   Assessment & Plan    Acute on chronic combined systolic and diastolic heart failure: non-ischemic cardiomyopathy.  NYHA IV.  Pt has biventricular failure with EF < 20 and presents with volume overload.  He has had issues with non-adherence in the past and has stopped Entresto, Spironolactone, hydralazine/nitrates.  Lactate elevated on admission, has normalized. -Net negative 17L on admission, weight is down 27 lbs.  He felt dry weight was 250 lbs, and is now 247 lbs.  Continues to have JVD on exam, but suspect due to severe TR, his lungs are clear and no edema.  Continue PO torsemide 20 mg daily -Continue aldactone 12.5 mg daily -Continue coreg 3.125 mg BID.  -Continue losartan 25 mg daily -Continue farxiga 10 mg daily -Will schedule f/u with Advanced Heart Failure  Hypertension: stable, meds as above   NSVT: continue to monitor.  16 beat run of NSVT on admission.  His K+ was low (2.8) and may be the cause.  Maintain K>4, Mag>2.  Had a 5 beat episode but otherwise no other episodes  [facility] will sign off.   Medication Recommendations:  Torsemide 20 mg daily, losartan 25 mg daily, Coreg 3.125 mg BID, aldactone 12.5 mg daily, Farxiga 10 mg daily Other recommendations (labs, testing, etc):  BMET/mag within 1 week Follow up as an outpatient:  [facility] appointment scheduled for 7/7.  Will also schedule with Advanced Heart Failure    For questions or updates,  please contact [facility] Please consult [email or web address] for contact info under        Signed, Little Ishikawa, MD  09/28/2020, 10:19 AM

## 2020-09-28 NOTE — Discharge Summary (Signed)
Physician Discharge Summary  Kevin Long [facility]:[id number] DOB: Dec 16, 1960 DOA: 09/21/2020  PCP: Fleet Contras, MD  Admit date: 09/21/2020 Discharge date: 09/28/2020  Admitted From: Home Disposition:  Home  Recommendations for Outpatient Follow-up:  Follow up with PCP in 1-2 weeks Follow up with Cardiology as scheduled  Discharge Condition:Improved CODE STATUS:Full Diet recommendation: Heart healthy, diabetic   Brief/Interim Summary: 60 year old with a history of PVCs, systolic CHF with EF 20-25%, DM2, HTN, OSA, PE, and obesity who was sent to the ED after being seen by his Cardiologist and found to be in severe volume overload.  Discharge Diagnoses:  Principal Problem:   Acute exacerbation of CHF (congestive heart failure) (HCC) Active Problems:   Hyperlipidemia   OBSTRUCTIVE SLEEP APNEA   Essential hypertension   Uncontrolled type 2 diabetes mellitus with hyperglycemia, with long-term current use of insulin (HCC)   Anasarca   Pulmonary embolism associated with COVID-19 (HCC)  Acute respiratory failure without hypoxia secondary to acute exacerbation of chronic systolic heart failure -TTE May 2021 with EF <20% and severely reduced RV systolic function  -Continued on diuretics -Cont to encourage good dietary, lifestyle, medication compliance -Continue I's and O's, fluid restriction, salt restriction, diuresing well over the past 24 hours -Cardiology recommendations noted for Aldactone 12.5mg  qday Coreg 3.125mg  BID Losartan 25mg  qday Farxiga 10mg  qday Torsemide 20mg  daily   Hypokalemia -currently normalized   Elevated total bilirubin -Felt to be due to passive hepatic congestion in setting of severe CHF and RV failure -Stable   HTN Continue current medications -BP remained stable   Uncontrolled DM2 with hyperglycemia A1c 7.1  Continue sliding scale insulin -glucose trends reviewed, stable   Normocytic anemia, likely iron deficiency with concurrent anemia of  chronic disease Hemoglobin stable, discussed increasing p.o. iron intake   History of PE Has completed his prescribed treatment course of Eliquis -not currently on this medication Remains on minimal O2 support   HLD Does not appear to be on statin currently, discussed need for dietary and lifestyle modifications   OSA Does not use CPAP at home -uses nocturnal nasal cannula oxygen   Morbid obesity - Body mass index is 42.93 kg/m.    Discharge Instructions   Allergies as of 09/28/2020       Reactions   Celebrex [celecoxib] Swelling   Swelling in face   Rosuvastatin Hives   Entresto [sacubitril-valsartan] Other (See Comments)   Dizziness        Medication List     STOP taking these medications    cephALEXin 500 MG capsule Commonly known as: KEFLEX   furosemide 40 MG tablet Commonly known as: LASIX       TAKE these medications    acetaminophen 500 MG tablet Commonly known as: TYLENOL Take 1,000 mg by mouth every 6 (six) hours as needed for headache (pain).   albuterol (2.5 MG/3ML) 0.083% nebulizer solution Commonly known as: PROVENTIL Take 2.5 mg by nebulization every 6 (six) hours as needed for wheezing or shortness of breath.   albuterol 108 (90 Base) MCG/ACT inhaler Commonly known as: VENTOLIN HFA Inhale 2 puffs into the lungs every 6 (six) hours as needed for wheezing or shortness of breath.   aspirin EC 81 MG tablet Take 81 mg by mouth daily. Swallow whole.   carvedilol 3.125 MG tablet Commonly known as: COREG Take 1 tablet (3.125 mg total) by mouth 2 (two) times daily with a meal. What changed:  medication strength how much to take   dapagliflozin propanediol 10 MG Tabs  tablet Commonly known as: FARXIGA Take 1 tablet (10 mg total) by mouth daily. Start taking on: September 29, 2020   diclofenac Sodium 1 % Gel Commonly known as: VOLTAREN Apply 1 application topically daily.   gabapentin 100 MG capsule Commonly known as: NEURONTIN Take 1  capsule (100 mg total) by mouth at bedtime as needed (neuropathic pain). What changed:  when to take this reasons to take this   linaclotide 145 MCG Caps capsule Commonly known as: LINZESS Take 145 mcg by mouth daily as needed (constipation).   losartan 25 MG tablet Commonly known as: COZAAR Take 1 tablet (25 mg total) by mouth daily. Start taking on: September 29, 2020   multivitamin with minerals Tabs tablet Take 1 tablet by mouth daily.   NovoLOG FlexPen 100 UNIT/ML FlexPen Generic drug: insulin aspart 0-20 Units, Subcutaneous, 3 times daily with meals CBG < 70: Implement Hypoglycemia measures CBG 70 - 120: 0 units CBG 121 - 150: 3 units CBG 151 - 200: 4 units CBG 201 - 250: 7 units CBG 251 - 300: 11 units CBG 301 - 350: 15 units CBG 351 - 400: 20 units CBG > 400: call MD What changed:  how much to take how to take this when to take this reasons to take this additional instructions   spironolactone 25 MG tablet Commonly known as: ALDACTONE Take 0.5 tablets (12.5 mg total) by mouth daily. Start taking on: September 29, 2020   torsemide 20 MG tablet Commonly known as: DEMADEX Take 1 tablet (20 mg total) by mouth daily. Start taking on: September 29, 2020   Vitamin D (Ergocalciferol) 1.25 MG (50000 UNIT) Caps capsule Commonly known as: DRISDOL Take 50,000 Units by mouth every Monday.   VITAMIN D PO Take 1 tablet by mouth daily.        Follow-up Information     Fleet ContrasAvbuere, Edwin, MD Follow up in 1 week(s).   Specialty: Internal Medicine Why: Hospital follow up Contact information: [address] [place] [address] [phone]         Follow up with Cardiology as scheduled Follow up.          [facility] Follow up on 10/24/2020.   Specialty: Cardiology Why: @ 12:00 PM. with heart failure NP. Parking Gate code: Doctor, [id number] Contact information: [address] [id number] mc [place] [id number] [phone]                Allergies  Allergen Reactions   Celebrex [Celecoxib] Swelling    Swelling in face   Rosuvastatin Hives   Entresto [Sacubitril-Valsartan] Other (See Comments)    Dizziness    Consultations: Cardiology  Procedures/Studies: DG Chest 2 View  Result Date: 09/17/2020 CLINICAL DATA:  Shortness of breath EXAM: CHEST - 2 VIEW COMPARISON:  09/08/2019 FINDINGS: Cardiomegaly. No focal opacity, pleural effusion or pneumothorax. No overt pulmonary edema. Minimal scarring in the right upper lobe. IMPRESSION: Cardiomegaly, appears increased compared to prior. Somewhat globular cardiac configuration, question pericardial effusion. Electronically Signed   By: Jasmine PangKim  Fujinaga M.D.   On: 09/17/2020 21:31   DG Chest Port 1 View  Result Date: 09/21/2020 CLINICAL DATA:  Shortness of breath and swelling. EXAM: PORTABLE CHEST 1 VIEW COMPARISON:  09/17/2020 and older studies. FINDINGS: Stable mild to moderate. Enlargement of the cardiopericardial silhouette, no mediastinal or hilar masses. Clear lungs.  No convincing pleural effusion or pneumothorax. Skeletal structures are grossly intact. IMPRESSION: 1. No acute cardiopulmonary disease.  No evidence  pulmonary edema. 2. Stable cardiomegaly. Electronically Signed   By: Amie Portland M.D.   On: 09/21/2020 11:40   ECHOCARDIOGRAM COMPLETE  Result Date: 09/22/2020    ECHOCARDIOGRAM REPORT   Patient Name:   Kevin Long Date of Exam: 09/22/2020 Medical Rec #:  [id number]     Height:       66.0 in Accession #:    [id number]    Weight:       273.2 lb Date of Birth:  1961-02-11     BSA:          2.283 m Patient Age:    59 years      BP:           120/82 mmHg Patient Gender: M             HR:           74 bpm. Exam Location:  Inpatient Procedure: 2D Echo, Cardiac Doppler, Color Doppler and Intracardiac            Opacification Agent Indications:    I50.21 Acute systolic (congestive) heart failure  History:        Patient has prior history of Echocardiogram  examinations, most                 recent 08/12/2019. Risk Factors:Diabetes, Hypertension,                 Dyslipidemia and Former Smoker.  Sonographer:    Roosvelt Maser RDCS Referring Phys: [id number] ALLISON WOLFE IMPRESSIONS  1. Left ventricular ejection fraction, by estimation, is <20%. The left ventricle has severely decreased function. The left ventricle demonstrates global hypokinesis. The left ventricular internal cavity size was moderately dilated. Left ventricular diastolic parameters are consistent with Grade II diastolic dysfunction (pseudonormalization). Elevated left atrial pressure. There is the interventricular septum is flattened in diastole ('D' shaped left ventricle), consistent with right ventricular volume overload.  2. Right ventricular systolic function is moderately reduced. The right ventricular size is moderately enlarged.  3. Left atrial size was mildly dilated.  4. Right atrial size was mildly dilated.  5. The mitral valve is normal in structure. Trivial mitral valve regurgitation.  6. The tricuspid valve is abnormal. Tricuspid valve regurgitation is severe.  7. The aortic valve is tricuspid. Aortic valve regurgitation is not visualized. No aortic stenosis is present. FINDINGS  Left Ventricle: Left ventricular ejection fraction, by estimation, is <20%. The left ventricle has severely decreased function. The left ventricle demonstrates global hypokinesis. The left ventricular internal cavity size was moderately dilated. There is no left ventricular hypertrophy. The interventricular septum is flattened in diastole ('D' shaped left ventricle), consistent with right ventricular volume overload. Left ventricular diastolic parameters are consistent with Grade II diastolic dysfunction (pseudonormalization). Elevated left atrial pressure. Right Ventricle: The right ventricular size is moderately enlarged. Right vetricular wall thickness was not well visualized. Right ventricular systolic function is  moderately reduced. Left Atrium: Left atrial size was mildly dilated. Right Atrium: Right atrial size was mildly dilated. Pericardium: Trivial pericardial effusion is present. Mitral Valve: The mitral valve is normal in structure. Trivial mitral valve regurgitation. Tricuspid Valve: The tricuspid valve is abnormal. Tricuspid valve regurgitation is severe. Aortic Valve: The aortic valve is tricuspid. Aortic valve regurgitation is not visualized. No aortic stenosis is present. Aortic valve mean gradient measures 3.0 mmHg. Aortic valve peak gradient measures 5.7 mmHg. Aortic valve area, by VTI measures 1.07 cm. Pulmonic Valve: The pulmonic valve was not well visualized. Pulmonic  valve regurgitation is trivial. Aorta: The aortic root is normal in size and structure. IAS/Shunts: The interatrial septum was not well visualized.  LEFT VENTRICLE PLAX 2D LVIDd:         6.10 cm  Diastology LVIDs:         5.60 cm  LV e' lateral:   5.22 cm/s LV PW:         0.90 cm  LV E/e' lateral: 15.1 LV IVS:        0.80 cm LVOT diam:     1.80 cm LV SV:         27 LV SV Index:   12 LVOT Area:     2.54 cm  RIGHT VENTRICLE RV Basal diam:  5.50 cm RV Mid diam:    5.00 cm LEFT ATRIUM             Index       RIGHT ATRIUM           Index LA diam:        4.30 cm 1.88 cm/m  RA Area:     21.60 cm LA Vol (A2C):   95.2 ml 41.69 ml/m RA Volume:   75.70 ml  33.15 ml/m LA Vol (A4C):   61.5 ml 26.94 ml/m LA Biplane Vol: 81.0 ml 35.48 ml/m  AORTIC VALVE AV Area (Vmax):    1.13 cm AV Area (Vmean):   1.01 cm AV Area (VTI):     1.07 cm AV Vmax:           119.00 cm/s AV Vmean:          82.100 cm/s AV VTI:            0.254 m AV Peak Grad:      5.7 mmHg AV Mean Grad:      3.0 mmHg LVOT Vmax:         52.90 cm/s LVOT Vmean:        32.500 cm/s LVOT VTI:          0.107 m LVOT/AV VTI ratio: 0.42  AORTA Ao Root diam: 3.30 cm MITRAL VALVE               TRICUSPID VALVE MV Area (PHT): 5.38 cm    TR Peak grad:   22.5 mmHg MV Decel Time: 141 msec    TR Vmax:         237.00 cm/s MV E velocity: 78.60 cm/s MV A velocity: 73.90 cm/s  SHUNTS MV E/A ratio:  1.06        Systemic VTI:  0.11 m                            Systemic Diam: 1.80 cm Epifanio Lesches MD Electronically signed by Epifanio Lesches MD Signature Date/Time: 09/22/2020/2:42:25 PM    Final    US Abdomen Limited RUQ (LIVER/GB)  Result Date: 09/23/2020 CLINICAL DATA:  Elevated bilirubin. EXAM: ULTRASOUND ABDOMEN LIMITED RIGHT UPPER QUADRANT COMPARISON:  None. FINDINGS: Gallbladder: Limited evaluation demonstrates no abnormalities. Common bile duct: Diameter: 4.6 mm Liver: No focal lesion identified. Within normal limits in parenchymal echogenicity. Portal vein is patent on color Doppler imaging with normal direction of blood flow towards the liver. Other: None. IMPRESSION: Limited study due to patient body habitus. No abnormalities are identified. Electronically Signed   By: Gerome Sam III M.D   On: 09/23/2020 20:14    Subjective: Eager to go home  Discharge  Exam: Vitals:   09/28/20 0553 09/28/20 1100  BP: 122/79 115/69  Pulse: 95 92  Resp: 17 17  Temp: 99 F (37.2 C) 98.7 F (37.1 C)  SpO2: 98% 100%   Vitals:   09/27/20 2121 09/28/20 0552 09/28/20 0553 09/28/20 1100  BP: 113/62  122/79 115/69  Pulse: 79  95 92  Resp: 19  17 17   Temp: 98.2 F (36.8 C)  99 F (37.2 C) 98.7 F (37.1 C)  TempSrc: Oral  Oral Oral  SpO2: 98%  98% 100%  Weight:  112 kg    Height:        General: Pt is alert, awake, not in acute distress Cardiovascular: RRR, S1/S2 + Respiratory: CTA bilaterally, no wheezing, no rhonchi Abdominal: Soft, NT, ND, bowel sounds + Extremities: no edema, no cyanosis   The results of significant diagnostics from this hospitalization (including imaging, microbiology, ancillary and laboratory) are listed below for reference.     Microbiology: Recent Results (from the past 240 hour(s))  Resp Panel by RT-PCR (Flu A&B, Covid) Nasopharyngeal Swab     Status: None    Collection Time: 09/21/20 11:17 AM   Specimen: Nasopharyngeal Swab; Nasopharyngeal(NP) swabs in vial transport medium  Result Value Ref Range Status   SARS Coronavirus 2 by RT PCR NEGATIVE NEGATIVE Final    Comment: (NOTE) SARS-CoV-2 target nucleic acids are NOT DETECTED.  The SARS-CoV-2 RNA is generally detectable in upper respiratory specimens during the acute phase of infection. The lowest concentration of SARS-CoV-2 viral copies this assay can detect is 138 copies/mL. A negative result does not preclude SARS-Cov-2 infection and should not be used as the sole basis for treatment or other patient management decisions. A negative result may occur with  improper specimen collection/handling, submission of specimen other than nasopharyngeal swab, presence of viral mutation(s) within the areas targeted by this assay, and inadequate number of viral copies(<138 copies/mL). A negative result must be combined with clinical observations, patient history, and epidemiological information. The expected result is Negative.  Fact Sheet for Patients:  BloggerCourse.com  Fact Sheet for Healthcare Providers:  SeriousBroker.it  This test is no t yet approved or cleared by the [place] FDA and  has been authorized for detection and/or diagnosis of SARS-CoV-2 by FDA under an Emergency Use Authorization (EUA). This EUA will remain  in effect (meaning this test can be used) for the duration of the COVID-19 declaration under Section 564(b)(1) of the Act, 21 U.S.C.section 360bbb-3(b)(1), unless the authorization is terminated  or revoked sooner.       Influenza A by PCR NEGATIVE NEGATIVE Final   Influenza B by PCR NEGATIVE NEGATIVE Final    Comment: (NOTE) The Xpert Xpress SARS-CoV-2/FLU/RSV plus assay is intended as an aid in the diagnosis of influenza from Nasopharyngeal swab specimens and should not be used as a sole basis for treatment.  Nasal washings and aspirates are unacceptable for Xpert Xpress SARS-CoV-2/FLU/RSV testing.  Fact Sheet for Patients: BloggerCourse.com  Fact Sheet for Healthcare Providers: SeriousBroker.it  This test is not yet approved or cleared by the [place] FDA and has been authorized for detection and/or diagnosis of SARS-CoV-2 by FDA under an Emergency Use Authorization (EUA). This EUA will remain in effect (meaning this test can be used) for the duration of the COVID-19 declaration under Section 564(b)(1) of the Act, 21 U.S.C. section 360bbb-3(b)(1), unless the authorization is terminated or revoked.  Performed at [facility], [address], [place], [place] [address]  Labs: BNP (last 3 results) Recent Labs    09/17/20 2040 09/21/20 1117  BNP 851.4* 1,031.1*   Basic Metabolic Panel: Recent Labs  Lab 09/24/20 0311 09/25/20 0216 09/26/20 0325 09/27/20 0127 09/28/20 0248  NA 136 137 138 136 138  K 3.8 3.7 4.1 3.8 4.1  CL 102 99 100 97* 99  CO2 25 29 28 30 28   GLUCOSE 155* 125* 104* 163* 108*  BUN 9 9 9 9 11   CREATININE 1.19 1.27* 1.24 1.36* 1.33*  CALCIUM 8.7* 9.2 9.4 9.5 9.3  MG 1.8 1.8 1.8 1.9 2.1   Liver Function Tests: Recent Labs  Lab 09/22/20 0213 09/23/20 0306 09/24/20 0311 09/25/20 0216 09/27/20 0127  AST 24 24 28 21 21   ALT 13 12 10 11 12   ALKPHOS 107 110 114 121 142*  BILITOT 2.8* 3.5* 2.8* 2.4* 2.8*  PROT 6.7 6.9 6.7 6.6 6.9  ALBUMIN 3.5 3.7 3.5 3.5 3.5   No results for input(s): LIPASE, AMYLASE in the last 168 hours. No results for input(s): AMMONIA in the last 168 hours. CBC: Recent Labs  Lab 09/23/20 0306 09/25/20 0216 09/27/20 0127  WBC 10.3 9.1 8.3  HGB 11.1* 10.9* 10.4*  HCT 33.4* 32.2* 30.7*  MCV 88.4 88.5 87.5  PLT 316 307 288   Cardiac Enzymes: No results for input(s): CKTOTAL, CKMB, CKMBINDEX, TROPONINI in the last 168 hours. BNP: Invalid input(s):  POCBNP CBG: Recent Labs  Lab 09/27/20 1136 09/27/20 1707 09/27/20 2120 09/28/20 0738 09/28/20 1110  GLUCAP 171* 97 109* 148* 113*   D-Dimer No results for input(s): DDIMER in the last 72 hours. Hgb A1c No results for input(s): HGBA1C in the last 72 hours. Lipid Profile No results for input(s): CHOL, HDL, LDLCALC, TRIG, CHOLHDL, LDLDIRECT in the last 72 hours. Thyroid function studies No results for input(s): TSH, T4TOTAL, T3FREE, THYROIDAB in the last 72 hours.  Invalid input(s): FREET3 Anemia work up No results for input(s): VITAMINB12, FOLATE, FERRITIN, TIBC, IRON, RETICCTPCT in the last 72 hours. Urinalysis    Component Value Date/Time   COLORURINE AMBER (A) 09/17/2020 2041   APPEARANCEUR CLOUDY (A) 09/17/2020 2041   LABSPEC 1.021 09/17/2020 2041   PHURINE 5.0 09/17/2020 2041   GLUCOSEU 50 (A) 09/17/2020 2041   HGBUR LARGE (A) 09/17/2020 2041   BILIRUBINUR NEGATIVE 09/17/2020 2041   KETONESUR 5 (A) 09/17/2020 2041   PROTEINUR 100 (A) 09/17/2020 2041   NITRITE NEGATIVE 09/17/2020 2041   LEUKOCYTESUR NEGATIVE 09/17/2020 2041   Sepsis Labs Invalid input(s): PROCALCITONIN,  WBC,  LACTICIDVEN Microbiology Recent Results (from the past 240 hour(s))  Resp Panel by RT-PCR (Flu A&B, Covid) Nasopharyngeal Swab     Status: None   Collection Time: 09/21/20 11:17 AM   Specimen: Nasopharyngeal Swab; Nasopharyngeal(NP) swabs in vial transport medium  Result Value Ref Range Status   SARS Coronavirus 2 by RT PCR NEGATIVE NEGATIVE Final    Comment: (NOTE) SARS-CoV-2 target nucleic acids are NOT DETECTED.  The SARS-CoV-2 RNA is generally detectable in upper respiratory specimens during the acute phase of infection. The lowest concentration of SARS-CoV-2 viral copies this assay can detect is 138 copies/mL. A negative result does not preclude SARS-Cov-2 infection and should not be used as the sole basis for treatment or other patient management decisions. A negative result may  occur with  improper specimen collection/handling, submission of specimen other than nasopharyngeal swab, presence of viral mutation(s) within the areas targeted by this assay, and inadequate number of viral copies(<138 copies/mL). A negative result must  be combined with clinical observations, patient history, and epidemiological information. The expected result is Negative.  Fact Sheet for Patients:  BloggerCourse.com  Fact Sheet for Healthcare Providers:  SeriousBroker.it  This test is no t yet approved or cleared by the [place] FDA and  has been authorized for detection and/or diagnosis of SARS-CoV-2 by FDA under an Emergency Use Authorization (EUA). This EUA will remain  in effect (meaning this test can be used) for the duration of the COVID-19 declaration under Section 564(b)(1) of the Act, 21 U.S.C.section 360bbb-3(b)(1), unless the authorization is terminated  or revoked sooner.       Influenza A by PCR NEGATIVE NEGATIVE Final   Influenza B by PCR NEGATIVE NEGATIVE Final    Comment: (NOTE) The Xpert Xpress SARS-CoV-2/FLU/RSV plus assay is intended as an aid in the diagnosis of influenza from Nasopharyngeal swab specimens and should not be used as a sole basis for treatment. Nasal washings and aspirates are unacceptable for Xpert Xpress SARS-CoV-2/FLU/RSV testing.  Fact Sheet for Patients: BloggerCourse.com  Fact Sheet for Healthcare Providers: SeriousBroker.it  This test is not yet approved or cleared by the [place] FDA and has been authorized for detection and/or diagnosis of SARS-CoV-2 by FDA under an Emergency Use Authorization (EUA). This EUA will remain in effect (meaning this test can be used) for the duration of the COVID-19 declaration under Section 564(b)(1) of the Act, 21 U.S.C. section 360bbb-3(b)(1), unless the authorization is terminated  or revoked.  Performed at [facility], [address] [place], [place] [address]    Time spent: 30 min  SIGNED:   Rickey Barbara, MD  [facility] 09/28/2020, 11:45 AM  If 7PM-7AM, please contact night-coverage

## 2020-09-28 NOTE — Progress Notes (Signed)
Call received from CCMD. Reported that pt had a 5 beat run of Vtach. Pt asymptomatic. Meng PA paged and notified.

## 2020-09-28 NOTE — Progress Notes (Signed)
Heart Failure Stewardship Pharmacist Progress Note   PCP: Fleet Contras, MD PCP-Cardiologist: None    HPI:  60 yo M with PMH of HFrEF, NICM, HTN, T2DM, HLD, obesity, and OSA. He presented to the ED on 6/24 with shortness of breath and edema. CXR showed clear lungs and no evidence of pulmonary edema. An ECHO was done on 6/25 and LVEF was <20% with moderately reduced RV systolic function (stable from prior ECHO in 07/2019). Of note, patient has stopped taking Entresto and spironolactone in the past due to vertigo and has intolerance to BiDil (unclear what his reaction was to this).  Discharge HF Medications: Torsemide 20 mg daily Carvedilol 3.125 mg BID Losartan 25 mg daily Spironolactone 12.5 mg daily Farxiga 10 mg daily  Prior to admission HF Medications: Furosemide 40 mg daily Carvedilol 12.5 mg BID  Pertinent Lab Values: Serum creatinine 1.33, BUN 11, Potassium 4.1, Sodium 138, BNP 1031.1, Magnesium 2.1 Hgb A1c 7.1  Vital Signs: Weight: 246 lbs (admission weight: 272 lbs) Blood pressure: 110/70s  Heart rate: 80-90s   Medication Assistance / Insurance Benefits Check: Does the patient have prescription insurance?  Yes Type of insurance plan: [place] Medicaid  Outpatient Pharmacy:  Prior to admission outpatient pharmacy: [facility] Is the patient willing to use [facility] TOC pharmacy at discharge? Yes Is the patient willing to transition their outpatient pharmacy to utilize a [facility] outpatient pharmacy?   Pending    Assessment: 1. Acute on chronic systolic CHF (EF <67%), due to NICM. NYHA class IV symptoms. - Continue torsemide 20 mg daily. Net negative 17L since admission - Continue carvedilol 3.125 mg BID - Continue losartan 25 mg daily. Noted intolerance to Entresto with dizziness/vertigo - Continue spironolactone 12.5 mg daily  - Continue Farxiga 10 mg daily  Plan: 1) Medication changes recommended at this time: - Discharge today  2) Patient assistance: - Marcelline Deist  requires prior authorization - pending approval - Patient has [place] Medicaid (copays $0-3 each per month) - HF TOC appt made for 7/7  3)  Education  - Patient has been educated on current HF medications and potential additions to HF medication regimen - Patient verbalizes understanding that over the next few months, these medication doses may change and more medications may be added to optimize HF regimen - Patient has been educated on basic disease state pathophysiology and goals of therapy - Time spent (30 mins)  Sharen Hones, PharmD, BCPS Heart Failure Stewardship Pharmacist Phone [phone]

## 2020-10-02 NOTE — Progress Notes (Signed)
[facility]  PCP: Fleet Contras Primary Cardiologist: Marca Ancona  HPI:  Kevin Long is a 60 y.o.  male  with a PMH significant for heart failure with reduced ejection fraction, non-ischemic cardiomyopathy, hypertension, diabetes mellitus, hyperlipidemia, OSA, obesity  Per chart review patient was diagnosed with a cardiomyopathy  around 2008.  He had left heart cath in 2014 with normal coronaries. He was never a heavy drinker and has not used drugs. No family history of cardiomyopathy.  Initially followed here in [facility] by Dr. Shirlee Latch.  His Echo in 2014 was 25-30%, 2015 40-45%,  07/2015 showed EF 25-30%  with diffuse hypokinesis. He was unable to fit in MRI for cardiac MRI.  He had several instances of self-discontinuing his heart medications for various reasons.  Never followed up after last visit with Dr. Shirlee Latch in 2018 when he was referred for ICD, switched to losartan and sent for sleep study.    March 2021 ended up establishing with Dr. Jacinto Halim had again self discontinued all meds and was started on bidil, apparently pcp was trying to restart his entresto and spiro.    May 2021 developed covid, PE, MSSA pneumonia and subsequent MSSA bacteremia.  At this time ECHO with EF <20%, Severely reduced RV function, probable moderate LV thrombus in apex.    Next seen by Dr. Bing Matter 09/21/2020.  Only on carvedilol, bidil and lasix at that time but poorly adherent. He was noted to be volume overloaded and reported a 20lbs weight gain in 2 months with worsening dyspnea on exertion.  Admission for IV diuresis was recommended.   He was seen in the ED at [facility] and transferred to [facility] for admission. At [facility] his initial workup demonstrated:  bp: 129/77, HR: 67, afebrile, RR: 18, oxygen 98%. BNP >1000, CXR with no infiltrate. EKG NSR rate 70, PVC.  He had a repeat 09/22/20 ECHO which demonstrated EF <20%, G2DD, Moderate RV dysfunction, elevated  RV pressure with D sign.  He was started on IV lasix wt trend 273>247.  Discharged  Since hospitalization feels back to his normal self.  Less short of breath, walked to the car to here without issue.  Walked around [employer] yesterday and did his shopping didn't have to stop.  Can now walk upstairs again and get to his bedroom.  Denies PND, does use one pillow for orthopnea.  BP at home has been 110-115/70.  Weight at home continues to decrease, 236 at home he has lost around 10 more pounds since discharge by his scales, 7 by ours.  Did not take his medications this morning.     ROS: All systems negative except as listed in HPI, PMH and Problem List.  SH:  Social History   Socioeconomic History   Marital status: Single    Spouse name: Not on file   Number of children: Not on file   Years of education: Not on file   Highest education level: Not on file  Occupational History   Not on file  Tobacco Use   Smoking status: Former    Packs/day: 1.00    Years: 25.00    Pack years: 25.00    Types: Cigarettes    Quit date: 03/31/2005    Years since quitting: 15.5   Smokeless tobacco: Never  Vaping Use   Vaping Use: Never used  Substance and Sexual Activity   Alcohol use: No   Drug use: No   Sexual activity: Not  on file  Other Topics Concern   Not on file  Social History Narrative   Not on file   Social Determinants of Health   Financial Resource Strain: Not on file  Food Insecurity: Not on file  Transportation Needs: Not on file  Physical Activity: Not on file  Stress: Not on file  Social Connections: Not on file  Intimate Partner Violence: Not on file    FH:  Family History  Problem Relation Age of Onset   Diabetes Mother    Anxiety disorder Brother    Depression Son    Heart murmur Son    Heart attack Neg Hx    Hypertension Neg Hx    Stroke Neg Hx     Past Medical History:  Diagnosis Date   Abnormal LFTs    Asthma    Bacteremia 09/19/2020   Degeneration of  lumbosacral intervertebral disc 09/19/2020   Diabetes mellitus, type 2 (HCC)    Essential hypertension 02/09/2007   Qualifier: Diagnosis of  By: Yetta Barre CNA/MA, Jessica     Gout 09/19/2020   HFrEF (heart failure with reduced ejection fraction) (HCC) 09/25/2016   Echocardiogram 09/22/20: EF < 20, global HK, Gr 2 DD, elevated LA pressure, D shaped LV c/w RV vol overload, mod reduced RVSF, mild BAE, trivial MR, severe TR   History of COVID-19    Admit in 07/2019 - course c/b pulmonary embolism, pneumonia, R lung mass (?infectious), MSSA bacteremia, a/c CHF, metabolic encephalopathy, AKI   Hx of Pulmonary Embolism 08/12/2019   In setting of COVID-19 pneumonia with post obstructive pneumonia (pt left hosp AMA)   Hypercholesterolemia    Mass of upper lobe of right lung 08/12/2019   during admit for COVID-19 pneumonia   NICM (nonischemic cardiomyopathy) (HCC) 08/13/2015   Normal Cath in 2014   Obesity    OSA (obstructive sleep apnea)    Pain in joint involving pelvic region and thigh 09/19/2020   Pain in joint, lower leg 09/19/2020   Palpitations 08/13/2015   PVC's (premature ventricular contractions) 03/28/2016   Noted on prior monitor but not quantified   Tobacco use disorder 09/19/2020   Vertigo 09/25/2016    Current Outpatient Medications  Medication Sig Dispense Refill   acetaminophen (TYLENOL) 500 MG tablet Take 1,000 mg by mouth every 6 (six) hours as needed for headache (pain).      albuterol (PROVENTIL) (2.5 MG/3ML) 0.083% nebulizer solution Take 2.5 mg by nebulization every 6 (six) hours as needed for wheezing or shortness of breath.     albuterol (VENTOLIN HFA) 108 (90 Base) MCG/ACT inhaler Inhale 2 puffs into the lungs every 6 (six) hours as needed for wheezing or shortness of breath.     aspirin EC 81 MG tablet Take 81 mg by mouth daily. Swallow whole.     carvedilol (COREG) 3.125 MG tablet Take 1 tablet (3.125 mg total) by mouth 2 (two) times daily with a meal. 60 tablet 0    dapagliflozin propanediol (FARXIGA) 10 MG TABS tablet Take 1 tablet (10 mg total) by mouth daily. 30 tablet 0   diclofenac Sodium (VOLTAREN) 1 % GEL Apply 1 application topically daily.     gabapentin (NEURONTIN) 100 MG capsule Take 1 capsule (100 mg total) by mouth at bedtime as needed (neuropathic pain). 30 capsule 0   insulin aspart (NOVOLOG FLEXPEN) 100 UNIT/ML FlexPen 0-20 Units, Subcutaneous, 3 times daily with meals CBG < 70: Implement Hypoglycemia measures CBG 70 - 120: 0 units CBG 121 - 150: 3  units CBG 151 - 200: 4 units CBG 201 - 250: 7 units CBG 251 - 300: 11 units CBG 301 - 350: 15 units CBG 351 - 400: 20 units CBG > 400: call MD 15 mL 0   linaclotide (LINZESS) 145 MCG CAPS capsule Take 145 mcg by mouth daily as needed (constipation).     losartan (COZAAR) 25 MG tablet Take 1 tablet (25 mg total) by mouth daily. 30 tablet 0   Multiple Vitamin (MULTIVITAMIN WITH MINERALS) TABS tablet Take 1 tablet by mouth daily.     spironolactone (ALDACTONE) 25 MG tablet Take 0.5 tablets (12.5 mg total) by mouth daily. 15 tablet 0   Vitamin D, Ergocalciferol, (DRISDOL) 1.25 MG (50000 UNIT) CAPS capsule Take 50,000 Units by mouth every Monday.     torsemide (DEMADEX) 20 MG tablet Take 1 tablet (20 mg total) by mouth every other day. 15 tablet 0   No current facility-administered medications for this encounter.    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  He remains mildly overloaded on exam  -Ideally would be able to switch to entresto but he has had dizziness with this before and is unwilling to try it again.  Need to slow diuresis down since we are approaching euvolemia, we discussed in detail how to take torsemide PRN but he had difficulty understanding this so we will decrease torsemide to 20mg  every other day -Did not take meds this am and bp 110/70.  Reports tight control at home.  Continue losartan 25, spiro 12.5, carvedilol 3.125 BID, farxgia 10 -will need continued weight loss, repeat sleep study  T2DM: -well controlled, continue farxgia    Already has follow up appointment with AHF (scheduled during hospitalization)

## 2020-10-03 ENCOUNTER — Telehealth (HOSPITAL_COMMUNITY): Payer: Self-pay | Admitting: Surgery

## 2020-10-03 NOTE — Telephone Encounter (Signed)
Patient called and reminded of scheduled Heart Impact Appt for tomorrow.  He tells me that he plans to come for appt.

## 2020-10-04 ENCOUNTER — Other Ambulatory Visit: Payer: Self-pay

## 2020-10-04 ENCOUNTER — Encounter (HOSPITAL_COMMUNITY): Payer: Self-pay

## 2020-10-04 ENCOUNTER — Ambulatory Visit (HOSPITAL_COMMUNITY)
Admit: 2020-10-04 | Discharge: 2020-10-04 | Disposition: A | Discharge status: Home / Self Care | Payer: Medicaid Other | Source: Ambulatory Visit | Attending: Internal Medicine | Admitting: Internal Medicine

## 2020-10-04 VITALS — BP 110/70 | HR 95 | Wt 240.4 lb

## 2020-10-04 DIAGNOSIS — Z7982 Long term (current) use of aspirin: Secondary | ICD-10-CM | POA: Insufficient documentation

## 2020-10-04 DIAGNOSIS — I110 Hypertensive heart disease with heart failure: Secondary | ICD-10-CM | POA: Insufficient documentation

## 2020-10-04 DIAGNOSIS — Z794 Long term (current) use of insulin: Secondary | ICD-10-CM | POA: Insufficient documentation

## 2020-10-04 DIAGNOSIS — E119 Type 2 diabetes mellitus without complications: Secondary | ICD-10-CM

## 2020-10-04 DIAGNOSIS — I5082 Biventricular heart failure: Secondary | ICD-10-CM | POA: Insufficient documentation

## 2020-10-04 DIAGNOSIS — I428 Other cardiomyopathies: Secondary | ICD-10-CM | POA: Insufficient documentation

## 2020-10-04 DIAGNOSIS — Z8249 Family history of ischemic heart disease and other diseases of the circulatory system: Secondary | ICD-10-CM | POA: Insufficient documentation

## 2020-10-04 DIAGNOSIS — Z87891 Personal history of nicotine dependence: Secondary | ICD-10-CM | POA: Diagnosis not present

## 2020-10-04 DIAGNOSIS — Z79899 Other long term (current) drug therapy: Secondary | ICD-10-CM | POA: Diagnosis not present

## 2020-10-04 DIAGNOSIS — I5042 Chronic combined systolic (congestive) and diastolic (congestive) heart failure: Secondary | ICD-10-CM

## 2020-10-04 LAB — BASIC METABOLIC PANEL
Anion gap: 9 (ref 5–15)
BUN: 9 mg/dL (ref 8–23)
CO2: 28 mmol/L (ref 22–32)
Calcium: 9.5 mg/dL (ref 8.9–10.3)
Chloride: 101 mmol/L (ref 98–111)
Creatinine, Ser: 1.3 mg/dL — ABNORMAL HIGH (ref 0.61–1.24)
GFR, Estimated: >60 mL/min (ref >60)
Glucose, Bld: 92 mg/dL (ref 70–99)
Potassium: 3.8 mmol/L (ref 3.5–5.1)
Sodium: 138 mmol/L (ref 135–145)

## 2020-10-04 LAB — BRAIN NATRIURETIC PEPTIDE: B Natriuretic Peptide: 654.8 pg/mL — ABNORMAL HIGH (ref 0.0–100.0)

## 2020-10-04 MED ORDER — TORSEMIDE 20 MG PO TABS: 20.0000 mg | ORAL_TABLET | ORAL | 0 refills | Status: DC

## 2020-10-04 NOTE — Progress Notes (Signed)
Provided education on heart failure self-management including daily weights, taking medications as directed, and salt and fluid restrictions.

## 2020-10-04 NOTE — Patient Instructions (Signed)
Decrease Torsemide to 20 mg every other day   Labs done today, your results will be available in MyChart, we will contact you for abnormal readings.  Thank you for allowing [place] to provider your heart failure care after your recent hospitalization. Please follow-up with the [facility] as scheduled July 27  If you have any questions, issues, or concerns before your next appointment please call our office at [phone], opt. 2 and leave a message for the triage nurse.  Do the following things EVERYDAY: Weigh yourself in the morning before breakfast. Write it down and keep it in a log. Take your medicines as prescribed Eat low salt foods--Limit salt (sodium) to 2000 mg per day.  Stay as active as you can everyday Limit all fluids for the day to less than 2 liters  Weigh yourself EVERY morning after you go to the bathroom but before you eat or drink anything. Write this number down in a weight log/diary. If you gain 3 pounds overnight or 5 pounds in a week, call the heart failure clinic  Limit your fluid intake to less than 2 liters of fluid per day. Fluid includes all drinks, coffee, juice, ice chips, soup, jello, and all other liquids.  Restrict your sodium intake to less than 2000mg  per day. This will help prevent your body from holding onto fluid. Read food labels as a lot of canned and packaged foods have a lot of sodium.

## 2020-10-24 ENCOUNTER — Ambulatory Visit (HOSPITAL_COMMUNITY)
Admit: 2020-10-24 | Discharge: 2020-10-24 | Disposition: A | Discharge status: Home / Self Care | Payer: Medicaid Other | Source: Ambulatory Visit | Attending: Family Medicine | Admitting: Family Medicine

## 2020-10-24 ENCOUNTER — Other Ambulatory Visit: Payer: Self-pay

## 2020-10-24 ENCOUNTER — Encounter (HOSPITAL_COMMUNITY): Payer: Self-pay

## 2020-10-24 VITALS — BP 118/68 | HR 70 | Wt 229.8 lb

## 2020-10-24 DIAGNOSIS — E669 Obesity, unspecified: Secondary | ICD-10-CM | POA: Insufficient documentation

## 2020-10-24 DIAGNOSIS — E119 Type 2 diabetes mellitus without complications: Secondary | ICD-10-CM | POA: Diagnosis not present

## 2020-10-24 DIAGNOSIS — Z8616 Personal history of COVID-19: Secondary | ICD-10-CM | POA: Insufficient documentation

## 2020-10-24 DIAGNOSIS — Z886 Allergy status to analgesic agent status: Secondary | ICD-10-CM | POA: Insufficient documentation

## 2020-10-24 DIAGNOSIS — Z7982 Long term (current) use of aspirin: Secondary | ICD-10-CM | POA: Diagnosis not present

## 2020-10-24 DIAGNOSIS — R42 Dizziness and giddiness: Secondary | ICD-10-CM | POA: Insufficient documentation

## 2020-10-24 DIAGNOSIS — Z8249 Family history of ischemic heart disease and other diseases of the circulatory system: Secondary | ICD-10-CM | POA: Insufficient documentation

## 2020-10-24 DIAGNOSIS — Z794 Long term (current) use of insulin: Secondary | ICD-10-CM | POA: Diagnosis not present

## 2020-10-24 DIAGNOSIS — Z87891 Personal history of nicotine dependence: Secondary | ICD-10-CM | POA: Insufficient documentation

## 2020-10-24 DIAGNOSIS — Z833 Family history of diabetes mellitus: Secondary | ICD-10-CM | POA: Insufficient documentation

## 2020-10-24 DIAGNOSIS — Z6837 Body mass index (BMI) 37.0-37.9, adult: Secondary | ICD-10-CM | POA: Diagnosis not present

## 2020-10-24 DIAGNOSIS — Z79899 Other long term (current) drug therapy: Secondary | ICD-10-CM | POA: Insufficient documentation

## 2020-10-24 DIAGNOSIS — I5082 Biventricular heart failure: Secondary | ICD-10-CM | POA: Diagnosis not present

## 2020-10-24 DIAGNOSIS — I428 Other cardiomyopathies: Secondary | ICD-10-CM | POA: Insufficient documentation

## 2020-10-24 DIAGNOSIS — I5042 Chronic combined systolic (congestive) and diastolic (congestive) heart failure: Secondary | ICD-10-CM

## 2020-10-24 DIAGNOSIS — I110 Hypertensive heart disease with heart failure: Secondary | ICD-10-CM | POA: Insufficient documentation

## 2020-10-24 LAB — BASIC METABOLIC PANEL
Anion gap: 7 (ref 5–15)
BUN: 11 mg/dL (ref 8–23)
CO2: 28 mmol/L (ref 22–32)
Calcium: 9.1 mg/dL (ref 8.9–10.3)
Chloride: 100 mmol/L (ref 98–111)
Creatinine, Ser: 1.23 mg/dL (ref 0.61–1.24)
GFR, Estimated: >60 mL/min (ref >60)
Glucose, Bld: 156 mg/dL — ABNORMAL HIGH (ref 70–99)
Potassium: 3.3 mmol/L — ABNORMAL LOW (ref 3.5–5.1)
Sodium: 135 mmol/L (ref 135–145)

## 2020-10-24 MED ORDER — DAPAGLIFLOZIN PROPANEDIOL 10 MG PO TABS: 10.0000 mg | ORAL_TABLET | Freq: Every day | ORAL | 3 refills | Status: AC

## 2020-10-24 MED ORDER — CARVEDILOL 3.125 MG PO TABS: 3.1250 mg | ORAL_TABLET | Freq: Two times a day (BID) | ORAL | 3 refills | Status: DC

## 2020-10-24 MED ORDER — TORSEMIDE 20 MG PO TABS: 20.0000 mg | ORAL_TABLET | ORAL | 3 refills | Status: DC

## 2020-10-24 MED ORDER — LOSARTAN POTASSIUM 25 MG PO TABS: 25.0000 mg | ORAL_TABLET | Freq: Every day | ORAL | 3 refills | Status: DC

## 2020-10-24 MED ORDER — SPIRONOLACTONE 25 MG PO TABS: 25.0000 mg | ORAL_TABLET | Freq: Every day | ORAL | 6 refills | Status: DC

## 2020-10-24 NOTE — Progress Notes (Signed)
Advanced [facility] Failure Clinic Note   PCP: Fleet Contras, MD PCP-Cardiologist: None  HF Cardiologist: Dr. Shirlee Latch  HPI: Kevin Long is a 60 y.o.  male  with a PMH significant for heart failure with reduced ejection fraction, non-ischemic cardiomyopathy, hypertension, diabetes mellitus, hyperlipidemia, OSA, obesity.   He was diagnosed with a cardiomyopathy  around 2008.  He had left heart cath in 2014 with normal coronaries. His Echo in 2014 was 25-30%, 2015 40-45%,  07/2015 showed EF 25-30%  with diffuse hypokinesis. He was too large for cardiac MRI.  He had several instances of self-discontinuing his heart medications for various reasons.  Never followed up after last visit with Dr. Shirlee Latch in 2018 when he was referred for ICD, switched to losartan and sent for sleep study.     March 2021 he established with Dr. Jacinto Halim and gain self-discontinued all meds and was started on bidil, his pcp was trying to restart his Entresto and spiro.     May 2021 developed Covid, PE, MSSA pneumonia and subsequent MSSA bacteremia.  At this time ECHO with EF <20%, Severely reduced RV function, probable moderate LV thrombus in apex.     Next seen by Dr. Bing Matter 09/21/2020. Only on carvedilol, bidil and lasix at that time but with poor compliance. He was admitted for A/C CHF. He had a repeat 09/22/20 echo which demonstrated EF <20%, G2DD, Moderate RV dysfunction, elevated RV pressure with D sign.  He was started on IV lasix and transitioned to po, with weight down from 273 lbs to 247 on discharge.  He followed up in [place] [facility] 6/22 with Dr. Frances Furbish. Stable NYHA II symptoms, weight continued to trend down. Torsemide further reduced.   Today he returns for post hospitalization AHF follow up. He has not been seen in our clinic since 11/2016. He is not SOB walking or with stairs. Overall feeling fine, says his swelling is much better. Denies CP, dizziness, edema, or PND/Orthopnea. Appetite ok. No fever or chills. Weight  at home 225-226 pounds, he has been weighing daily and brought log today. Taking all medications. Bp at home ~120-130/70-80.  ECG (personally reviewed): NSR 63 bpm.   Review of Systems: [y] = yes, [ ]  = no   General: Weight gain [ ] ; Weight loss [y]; Anorexia [ ] ; Fatigue [ ] ; Fever [ ] ; Chills [ ] ; Weakness [ ]   Cardiac: Chest pain/pressure [ ] ; Resting SOB [ ] ; Exertional SOB [ ] ; Orthopnea [ ] ; Pedal Edema [ ] ; Palpitations [ ] ; Syncope [ ] ; Presyncope [ ] ; Paroxysmal nocturnal dyspnea[ ]   Pulmonary: Cough [ ] ; Wheezing[ ] ; Hemoptysis[ ] ; Sputum [ ] ; Snoring [ ]   GI: Vomiting[ ] ; Dysphagia[ ] ; Melena[ ] ; Hematochezia [ ] ; Heartburn[ ] ; Abdominal pain [ ] ; Constipation [ ] ; Diarrhea [ ] ; BRBPR [ ]   GU: Hematuria[ ] ; Dysuria [ ] ; Nocturia[ ]   Vascular: Pain in legs with walking [ ] ; Pain in feet with lying flat [ ] ; Non-healing sores [ ] ; Stroke [ ] ; TIA [ ] ; Slurred speech [ ] ;  Neuro: Headaches[ ] ; Vertigo[ ] ; Seizures[ ] ; Paresthesias[ ] ;Blurred vision [ ] ; Diplopia [ ] ; Vision changes [ ]   Ortho/Skin: Arthritis [y]; Joint pain [ ] ; Muscle pain [ ] ; Joint swelling [ ] ; Back Pain [ ] ; Rash [ ]   Psych: Depression[ ] ; Anxiety[ ]   Heme: Bleeding problems [ ] ; Clotting disorders [ ] ; Anemia [ ]   Endocrine: Diabetes [y]; Thyroid dysfunction[ ]   Labs (6/17): LDL 155, K 4.6, creatinine 1.19 Labs (  10/17): K 3.7, creatinine 1.1, BNP 78 Labs (12/17): K 3.6, creatinine 1.15 Labs (3/18): K 3.8, creatinine 1.09 Labs (7/22): K 3.8, creatinine 1.3  PMH: 1. Type II diabetes 2. Hyperlipidemia: has not tolerated atorvastatin or pravastatin. 3. HTN 4. OSA: Not using CPAP. 5. OA: Primarily knees. 6. Chronic systolic CHF: Nonischemic cardiomyopathy.   - Cardiolite 2008 with EF 44%, no ischemia. - LHC (2014): EF 25%, normal coronaries. - Echo (1/15): EF 25-30%. - Echo (5/17): EF 25-30%, moderate LV dilation, mild MR, PASP 48 mmHg. - Echo (3/18): EF 25%, diffuse hypokinesis, normal RV size and  systolic function.  - Echo (8/18): EF 25-30%, severe LV dilation, mild MR, moderate RV dilation with mildly decreased systolic function.  - Echo (6/22): EF <20%, G2DD, Moderate RV dysfunction, elevated RV pressure with D sign. 7. PVCs: Noted by 30 day monitor, not quantified.   SH: Lives in [place], quit smoking in his 40s, no drugs, no ETOH, used to be a IT trainer.   FH: No cardiomyopathy or other cardiac history that he is aware of.   Past Medical History:  Diagnosis Date   Abnormal LFTs    Asthma    Bacteremia 09/19/2020   Degeneration of lumbosacral intervertebral disc 09/19/2020   Diabetes mellitus, type 2 (HCC)    Essential hypertension 02/09/2007   Qualifier: Diagnosis of  By: Yetta Barre CNA/MA, Timonthy Hovater     Gout 09/19/2020   HFrEF (heart failure with reduced ejection fraction) (HCC) 09/25/2016   Echocardiogram 09/22/20: EF < 20, global HK, Gr 2 DD, elevated LA pressure, D shaped LV c/w RV vol overload, mod reduced RVSF, mild BAE, trivial MR, severe TR   History of COVID-19    Admit in 07/2019 - course c/b pulmonary embolism, pneumonia, R lung mass (?infectious), MSSA bacteremia, a/c CHF, metabolic encephalopathy, AKI   Hx of Pulmonary Embolism 08/12/2019   In setting of COVID-19 pneumonia with post obstructive pneumonia (pt left hosp AMA)   Hypercholesterolemia    Mass of upper lobe of right lung 08/12/2019   during admit for COVID-19 pneumonia   NICM (nonischemic cardiomyopathy) (HCC) 08/13/2015   Normal Cath in 2014   Obesity    OSA (obstructive sleep apnea)    Pain in joint involving pelvic region and thigh 09/19/2020   Pain in joint, lower leg 09/19/2020   Palpitations 08/13/2015   PVC's (premature ventricular contractions) 03/28/2016   Noted on prior monitor but not quantified   Tobacco use disorder 09/19/2020   Vertigo 09/25/2016   Current Outpatient Medications  Medication Sig Dispense Refill   acetaminophen (TYLENOL) 500 MG tablet Take 1,000 mg by mouth every 6  (six) hours as needed for headache (pain).      albuterol (PROVENTIL) (2.5 MG/3ML) 0.083% nebulizer solution Take 2.5 mg by nebulization every 6 (six) hours as needed for wheezing or shortness of breath.     albuterol (VENTOLIN HFA) 108 (90 Base) MCG/ACT inhaler Inhale 2 puffs into the lungs every 6 (six) hours as needed for wheezing or shortness of breath.     aspirin EC 81 MG tablet Take 81 mg by mouth daily. Swallow whole.     carvedilol (COREG) 3.125 MG tablet Take 1 tablet (3.125 mg total) by mouth 2 (two) times daily with a meal. 60 tablet 0   dapagliflozin propanediol (FARXIGA) 10 MG TABS tablet Take 1 tablet (10 mg total) by mouth daily. 30 tablet 0   diclofenac Sodium (VOLTAREN) 1 % GEL Apply 1 application topically daily.  gabapentin (NEURONTIN) 100 MG capsule Take 1 capsule (100 mg total) by mouth at bedtime as needed (neuropathic pain). 30 capsule 0   insulin aspart (NOVOLOG FLEXPEN) 100 UNIT/ML FlexPen 0-20 Units, Subcutaneous, 3 times daily with meals CBG < 70: Implement Hypoglycemia measures CBG 70 - 120: 0 units CBG 121 - 150: 3 units CBG 151 - 200: 4 units CBG 201 - 250: 7 units CBG 251 - 300: 11 units CBG 301 - 350: 15 units CBG 351 - 400: 20 units CBG > 400: call MD 15 mL 0   linaclotide (LINZESS) 145 MCG CAPS capsule Take 145 mcg by mouth daily as needed (constipation).     losartan (COZAAR) 25 MG tablet Take 1 tablet (25 mg total) by mouth daily. 30 tablet 0   Multiple Vitamin (MULTIVITAMIN WITH MINERALS) TABS tablet Take 1 tablet by mouth daily.     spironolactone (ALDACTONE) 25 MG tablet Take 0.5 tablets (12.5 mg total) by mouth daily. 15 tablet 0   torsemide (DEMADEX) 20 MG tablet Take 1 tablet (20 mg total) by mouth every other day. 15 tablet 0   Vitamin D, Ergocalciferol, (DRISDOL) 1.25 MG (50000 UNIT) CAPS capsule Take 50,000 Units by mouth every Monday.     No current facility-administered medications for this encounter.   Allergies  Allergen Reactions   Celebrex  [Celecoxib] Swelling    Swelling in face   Rosuvastatin Hives   Entresto [Sacubitril-Valsartan] Other (See Comments)    Dizziness   Social History   Socioeconomic History   Marital status: Single    Spouse name: Not on file   Number of children: Not on file   Years of education: Not on file   Highest education level: Not on file  Occupational History   Not on file  Tobacco Use   Smoking status: Former    Packs/day: 1.00    Years: 25.00    Pack years: 25.00    Types: Cigarettes    Quit date: 03/31/2005    Years since quitting: 15.5   Smokeless tobacco: Never  Vaping Use   Vaping Use: Never used  Substance and Sexual Activity   Alcohol use: No   Drug use: No   Sexual activity: Not on file  Other Topics Concern   Not on file  Social History Narrative   Not on file   Social Determinants of Health   Financial Resource Strain: Not on file  Food Insecurity: Not on file  Transportation Needs: Not on file  Physical Activity: Not on file  Stress: Not on file  Social Connections: Not on file  Intimate Partner Violence: Not on file    Family History  Problem Relation Age of Onset   Diabetes Mother    Anxiety disorder Brother    Depression Son    Heart murmur Son    Heart attack Neg Hx    Hypertension Neg Hx    Stroke Neg Hx    BP 118/68   Pulse 70   Wt 104.2 kg (229 lb 12.8 oz)   SpO2 95%   BMI 37.09 kg/m   Wt Readings from Last 3 Encounters:  10/24/20 104.2 kg (229 lb 12.8 oz)  10/04/20 109 kg (240 lb 6.4 oz)  09/28/20 112 kg (246 lb 14.4 oz)   PHYSICAL EXAM: ReDs Clip: 38% General:  NAD. No resp difficulty HEENT: Normal Neck: Supple. JVP 7-8, prominent v waves. Carotids 2+ bilat; no bruits. No lymphadenopathy or thryomegaly appreciated. Cor: PMI nondisplaced. Regular rate &  rhythm. No rubs, gallops, II/VI +TR Lungs: Clear Abdomen: Obese, nontender, nondistended. No hepatosplenomegaly. No bruits or masses. Good bowel sounds. Extremities: No cyanosis,  clubbing, rash, edema Neuro: Alert & oriented x 3, cranial nerves grossly intact. Moves all 4 extremities w/o difficulty. Affect pleasant.  Assessment/Plan: Chronic Combined Systolic and Diastolic CHF/Biventricular CHF - NICM, poorly controlled HTN, poor medication adherence - LHC (2014 ) with normal coronaries - Echo (2014): EF 25-30%, 2015 40-45%,  07/2015 showed EF 25-30% - Echo (6/22): EF <20%, G2DD, Moderate RV dysfunction, elevated RV pressure with D sign - NYHA II, he is mildly volume overloaded today. - Increase spiro to 25 mg daily. BMET today, repeat in 10 days. - Continue torsemide 20 mg every other day.  - Continue losartan 25 mg daily. (He had dizziness with Sherryll Burger and is unwilling to re-challenge). - Continue carvedilol 3.125 bid. - Continue Farxgia 10 mg daily. - Repeat sleep study. 2. T2DM: a1c 7.1 (6/22). - Continue Farxiga. No GU symptoms. 3. Obesity - Body mass index is 37.09 kg/m. - He needs weight loss.   Follow up in 4 weeks with APP, 2 months with Dr. Shirlee Latch + echo.  Anderson [place], FNP 10/24/20

## 2020-10-24 NOTE — Progress Notes (Signed)
ReDS Vest / Clip - 10/24/20 1200       ReDS Vest / Clip   Station Marker D    Ruler Value 33    ReDS Value Range Moderate volume overload    ReDS Actual Value 38

## 2020-10-24 NOTE — Patient Instructions (Signed)
INCREASE Spironolactone to 25 mg, one tab daily  Labs today We will only contact you if something comes back abnormal or we need to make some changes. Otherwise no news is good news!  Labs needed in 7-10 days  Your physician recommends that you schedule a follow-up appointment in: 3-4 weeks  in the Advanced Practitioners (PA/NP) Clinic  \  Do the following things EVERYDAY: Weigh yourself in the morning before breakfast. Write it down and keep it in a log. Take your medicines as prescribed Eat low salt foods--Limit salt (sodium) to 2000 mg per day.  Stay as active as you can everyday Limit all fluids for the day to less than 2 liters  milAt the [facility], you and your health needs are our priority. As part of our continuing mission to provide you with exceptional heart care, we have created designated Provider Care Teams. These Care Teams include your primary Cardiologist (physician) and Advanced Practice Providers (APPs- Physician Assistants and Nurse Practitioners) who all work together to provide you with the care you need, when you need it.   You may see any of the following providers on your designated Care Team at your next follow up: Dr Arvilla Meres Dr Marca Ancona Dr Brandon Melnick, NP Robbie Lis, [place] Mikki Santee Karle Plumber, PharmD   Please be sure to bring in all your medications bottles to every appointment.

## 2020-10-31 ENCOUNTER — Other Ambulatory Visit: Payer: Self-pay

## 2020-10-31 ENCOUNTER — Ambulatory Visit (HOSPITAL_COMMUNITY)
Admission: RE | Admit: 2020-10-31 | Discharge: 2020-10-31 | Disposition: A | Discharge status: Home / Self Care | Payer: Medicaid Other | Source: Ambulatory Visit | Attending: Internal Medicine | Admitting: Internal Medicine

## 2020-10-31 DIAGNOSIS — I5042 Chronic combined systolic (congestive) and diastolic (congestive) heart failure: Secondary | ICD-10-CM | POA: Insufficient documentation

## 2020-10-31 LAB — BASIC METABOLIC PANEL
Anion gap: 10 (ref 5–15)
BUN: 11 mg/dL (ref 8–23)
CO2: 26 mmol/L (ref 22–32)
Calcium: 9.3 mg/dL (ref 8.9–10.3)
Chloride: 101 mmol/L (ref 98–111)
Creatinine, Ser: 1.29 mg/dL — ABNORMAL HIGH (ref 0.61–1.24)
GFR, Estimated: >60 mL/min (ref >60)
Glucose, Bld: 203 mg/dL — ABNORMAL HIGH (ref 70–99)
Potassium: 3.9 mmol/L (ref 3.5–5.1)
Sodium: 137 mmol/L (ref 135–145)

## 2020-11-15 NOTE — Progress Notes (Signed)
error 

## 2020-11-16 ENCOUNTER — Ambulatory Visit (HOSPITAL_COMMUNITY)
Admission: RE | Admit: 2020-11-16 | Discharge: 2020-11-16 | Disposition: A | Discharge status: Home / Self Care | Payer: No Typology Code available for payment source | Source: Ambulatory Visit | Attending: Family Medicine | Admitting: Family Medicine

## 2020-11-16 ENCOUNTER — Telehealth (HOSPITAL_COMMUNITY): Payer: Self-pay | Admitting: Vascular Surgery

## 2020-11-16 ENCOUNTER — Other Ambulatory Visit (HOSPITAL_BASED_OUTPATIENT_CLINIC_OR_DEPARTMENT_OTHER): Payer: Self-pay

## 2020-11-16 ENCOUNTER — Other Ambulatory Visit: Payer: Self-pay

## 2020-11-16 DIAGNOSIS — I5042 Chronic combined systolic (congestive) and diastolic (congestive) heart failure: Secondary | ICD-10-CM

## 2020-11-16 NOTE — Telephone Encounter (Signed)
Pt showed for his 3-4 week f/u w/ np/pa. ;pt refused to do appt because he wanted to do an echo. Pt canceled appt and would like someone to call him with a echo appt

## 2020-11-19 NOTE — Telephone Encounter (Signed)
Pt had echo 6/25, he is not due for another echo at this time, attempted to call pt to discuss and Left message to call back

## 2020-11-19 NOTE — Telephone Encounter (Signed)
Spoke w/pt, discussed why we needed to wait a little while before repeating echo, per last OV note 7/27: Follow up in 4 weeks with APP, 2 months with Dr. Shirlee Latch + echo.  Advised pt we could sch him for echo and appt with DM in a month or 2, pt agreeable and appts sch, he does not want to be seen prior to this, if he has any issues he will let us know

## 2020-11-20 ENCOUNTER — Telehealth (HOSPITAL_BASED_OUTPATIENT_CLINIC_OR_DEPARTMENT_OTHER): Payer: Self-pay | Admitting: Cardiology

## 2020-11-20 NOTE — Telephone Encounter (Signed)
Patient is wanting to change cardiology care to Dr. Duke Salvia

## 2020-11-26 ENCOUNTER — Other Ambulatory Visit (HOSPITAL_COMMUNITY): Payer: Self-pay

## 2021-01-01 ENCOUNTER — Other Ambulatory Visit: Payer: Self-pay

## 2021-01-01 ENCOUNTER — Ambulatory Visit (HOSPITAL_COMMUNITY)
Admission: RE | Admit: 2021-01-01 | Discharge: 2021-01-01 | Disposition: A | Discharge status: Home / Self Care | Payer: Medicaid Other | Source: Ambulatory Visit | Attending: Cardiology | Admitting: Cardiology

## 2021-01-01 DIAGNOSIS — I340 Nonrheumatic mitral (valve) insufficiency: Secondary | ICD-10-CM | POA: Insufficient documentation

## 2021-01-01 DIAGNOSIS — I5042 Chronic combined systolic (congestive) and diastolic (congestive) heart failure: Secondary | ICD-10-CM | POA: Insufficient documentation

## 2021-01-01 LAB — ECHOCARDIOGRAM COMPLETE
Area-P 1/2: 4.49 cm2
Calc EF: 39.5 %
S' Lateral: 4.8 cm
Single Plane A2C EF: 43.9 %
Single Plane A4C EF: 34.6 %

## 2021-01-01 NOTE — Progress Notes (Signed)
  Echocardiogram 2D Echocardiogram has been performed.  Kevin Long 01/01/2021, 1:32 PM

## 2021-01-14 ENCOUNTER — Encounter (HOSPITAL_COMMUNITY): Payer: Self-pay | Admitting: Cardiology

## 2021-01-14 ENCOUNTER — Ambulatory Visit (HOSPITAL_COMMUNITY)
Admission: RE | Admit: 2021-01-14 | Discharge: 2021-01-14 | Disposition: A | Discharge status: Home / Self Care | Payer: Medicaid Other | Source: Ambulatory Visit | Attending: Cardiology | Admitting: Cardiology

## 2021-01-14 ENCOUNTER — Other Ambulatory Visit: Payer: Self-pay

## 2021-01-14 VITALS — BP 108/60 | HR 77 | Wt 229.2 lb

## 2021-01-14 DIAGNOSIS — Z79899 Other long term (current) drug therapy: Secondary | ICD-10-CM | POA: Insufficient documentation

## 2021-01-14 DIAGNOSIS — I428 Other cardiomyopathies: Secondary | ICD-10-CM | POA: Insufficient documentation

## 2021-01-14 DIAGNOSIS — I5022 Chronic systolic (congestive) heart failure: Secondary | ICD-10-CM | POA: Diagnosis not present

## 2021-01-14 DIAGNOSIS — I110 Hypertensive heart disease with heart failure: Secondary | ICD-10-CM | POA: Diagnosis present

## 2021-01-14 DIAGNOSIS — G4733 Obstructive sleep apnea (adult) (pediatric): Secondary | ICD-10-CM | POA: Insufficient documentation

## 2021-01-14 DIAGNOSIS — E785 Hyperlipidemia, unspecified: Secondary | ICD-10-CM | POA: Insufficient documentation

## 2021-01-14 DIAGNOSIS — Z87891 Personal history of nicotine dependence: Secondary | ICD-10-CM | POA: Diagnosis not present

## 2021-01-14 DIAGNOSIS — R400 Somnolence: Secondary | ICD-10-CM

## 2021-01-14 DIAGNOSIS — Z86711 Personal history of pulmonary embolism: Secondary | ICD-10-CM | POA: Insufficient documentation

## 2021-01-14 DIAGNOSIS — Z794 Long term (current) use of insulin: Secondary | ICD-10-CM | POA: Diagnosis not present

## 2021-01-14 DIAGNOSIS — Z7982 Long term (current) use of aspirin: Secondary | ICD-10-CM | POA: Insufficient documentation

## 2021-01-14 DIAGNOSIS — Z8616 Personal history of COVID-19: Secondary | ICD-10-CM | POA: Insufficient documentation

## 2021-01-14 DIAGNOSIS — E119 Type 2 diabetes mellitus without complications: Secondary | ICD-10-CM | POA: Diagnosis not present

## 2021-01-14 DIAGNOSIS — I5042 Chronic combined systolic (congestive) and diastolic (congestive) heart failure: Secondary | ICD-10-CM | POA: Diagnosis not present

## 2021-01-14 LAB — BASIC METABOLIC PANEL
Anion gap: 8 (ref 5–15)
BUN: 12 mg/dL (ref 8–23)
CO2: 28 mmol/L (ref 22–32)
Calcium: 9.2 mg/dL (ref 8.9–10.3)
Chloride: 101 mmol/L (ref 98–111)
Creatinine, Ser: 1.25 mg/dL — ABNORMAL HIGH (ref 0.61–1.24)
GFR, Estimated: >60 mL/min (ref >60)
Glucose, Bld: 240 mg/dL — ABNORMAL HIGH (ref 70–99)
Potassium: 4.3 mmol/L (ref 3.5–5.1)
Sodium: 137 mmol/L (ref 135–145)

## 2021-01-14 LAB — BRAIN NATRIURETIC PEPTIDE: B Natriuretic Peptide: 68.3 pg/mL (ref 0.0–100.0)

## 2021-01-14 MED ORDER — TORSEMIDE 20 MG PO TABS: 20.0000 mg | ORAL_TABLET | Freq: Every day | ORAL | 3 refills | Status: DC

## 2021-01-14 MED ORDER — SPIRONOLACTONE 25 MG PO TABS: 25.0000 mg | ORAL_TABLET | Freq: Every day | ORAL | 3 refills | Status: DC

## 2021-01-14 MED ORDER — CARVEDILOL 6.25 MG PO TABS: 6.2500 mg | ORAL_TABLET | Freq: Two times a day (BID) | ORAL | 3 refills | Status: DC

## 2021-01-14 NOTE — Patient Instructions (Addendum)
EKG done today.  Labs done today. We will contact you only if your labs are abnormal.  INCREASE Carvedilol to 6.25mg  (1 tablet) by mouth 2 times daily.   INCREASE Torsemide to 20mg  (1 tablet) by mouth daily.  INCREASE Spironolactone to 25mg  (1 tablet) by mouth daily.   No other medication changes were made. Please continue all current medications as prescribed.  Your physician has recommended that you have a sleep study. This test records several body functions during sleep, including: brain activity, eye movement, oxygen and carbon dioxide blood levels, heart rate and rhythm, breathing rate and rhythm, the flow of air through your mouth and nose, snoring, body muscle movements, and chest and belly movement. The sleep center will contact you to schedule an appointment.   Your physician recommends that you schedule a follow-up appointment in: 10 days for a lab only appointment and in 3 weeks with our NP/[facility] here in our office.   If you have any questions or concerns before your next appointment please send a message through [place] or call our office at [phone].    TO LEAVE A MESSAGE FOR THE NURSE SELECT OPTION 2, PLEASE LEAVE A MESSAGE INCLUDING: YOUR NAME DATE OF BIRTH CALL BACK NUMBER REASON FOR CALL**this is important as we prioritize the call backs  YOU WILL RECEIVE A CALL BACK THE SAME DAY AS LONG AS YOU CALL BEFORE 4:00 PM   Do the following things EVERYDAY: Weigh yourself in the morning before breakfast. Write it down and keep it in a log. Take your medicines as prescribed Eat low salt foods--Limit salt (sodium) to 2000 mg per day.  Stay as active as you can everyday Limit all fluids for the day to less than 2 liters   At [facility], you and your health needs are our priority. As part of our continuing mission to provide you with exceptional heart care, we have created designated Provider Care Teams. These Care Teams include your primary  Cardiologist (physician) and Advanced Practice Providers (APPs- Physician Assistants and Nurse Practitioners) who all work together to provide you with the care you need, when you need it.   You may see any of the following providers on your designated Care Team at your next follow up: Dr [address] [phone], NP Arvilla Meres, Carron Curie Robbie Lis, PharmD   Please be sure to bring in all your medications bottles to every appointment.

## 2021-01-15 NOTE — Progress Notes (Signed)
PCP: Dr. Concepcion Elk [facility] Cardiology: Dr. Shirlee Latch  60 y.o. with history of HTN, diabetes, and nonischemic cardiomyopathy with chronic systolic CHF.  Patient was diagnosed with a cardiomyopathy in around 2008.  He had left heart cath in 2014 with normal coronaries. He was never a heavy drinker and has not used drugs. No family history of cardiomyopathy.  He is very concerned that his cardiomyopathy came from Darvocet. Echo in 5/17 showed EF 25-30%, echo in 8/18 showed EF 25-30% with diffuse hypokinesis. He was unable to fit in MRI for cardiac MRI.   He has stopped cardiac meds multiple times for symptoms that would be unusual as side effects for those medications. He blamed vertigo (BPPV symptoms) on Entresto and will not retry.   He was lost to followup in this clinic for several years.  He was seen by multiple providers in the interim.  In 3/21, he had COVID-19, pulmonary embolus, and MSSA PNA.  Echo in 5/21 showed EF < 20% with severely reduced RV systolic function and LV thrombus.  He was lost to followup for about a year, then admitted in 6/22 with CHF exacerbation.  Echo in 6/22 showed EF < 20%, moderate RV dysfunction.  After this admission, he began followup again in the CHF clinic.   Echo was done in 10/22, EF better at 35-40%, no LV thrombus visualized, mild LV dilation, mildly decreased RV systolic function, mild MR.    Patient returns for followup of CHF.  Weight is stable.  Generally no dyspnea walking on flat ground.  He is short of breath walking up stairs or inclines.  Rare lightheadedness if he stands too fast.  No chest pain. No orthopnea/PND.  He stopped taking spironolactone as he thought it was causing him to have nosebleeds.  He still has occasional nosebleeds despite stopping spironolactone.   Labs (6/17): LDL 155, K 4.6, creatinine 1.19 Labs (10/17): K 3.7, creatinine 1.1, BNP 78 Labs (12/17): K 3.6, creatinine 1.15 Labs (3/18): K 3.8, creatinine 1.09 Labs (7/22): BNP 655 Labs  (8/22): K 3.9, creatinine 1.29  ECG (personally reviewed): NSR, QTc mildly prolonged at 471 msec  PMH: 1. Type II diabetes 2. Hyperlipidemia: has not tolerated atorvastatin or pravastatin.  3. HTN 4. OSA: Not using CPAP.  5. OA: Primarily knees.  6. Chronic systolic CHF: Nonischemic cardiomyopathy.   - Cardiolite 2008 with EF 44%, no ischemia.  - LHC (2014): EF 25%, normal coronaries.  - Echo (1/15): EF 25-30%. - Echo (5/17): EF 25-30%, moderate LV dilation, mild MR, PASP 48 mmHg.  - Echo (3/18): EF 25%, diffuse hypokinesis, normal RV size and systolic function.  - Echo (8/18): EF 25-30%, severe LV dilation, mild MR, moderate RV dilation with mildly decreased systolic function.  - Echo (5/21): EF < 20%, probable apical thrombus, severely decreased RV systolic function.  - Echo (6/22): EF < 20%, moderately decreased RV systolic function.  - Echo (10/22): EF better at 35-40%, no LV thrombus visualized, mild LV dilation, mildly decreased RV systolic function, mild MR.  7. PVCs: Noted by 30 day monitor, not quantified. 8. LV thrombus: Noted in 5/21.  9. COVID-19 in 5/21.  10. MSSA PNA 5/21 11. PE in 5/21 in setting of COVID-19 and MSSA PNA.   SH: Lives in [place], quit smoking in his 40s, no drugs, no ETOH, used to be a IT trainer.   FH: No cardiomyopathy or other cardiac history that he is aware of.  ROS: All systems reviewed and negative except as per HPI.  Current Outpatient Medications  Medication Sig Dispense Refill   acetaminophen (TYLENOL) 500 MG tablet Take 1,000 mg by mouth every 6 (six) hours as needed for headache (pain).      albuterol (PROVENTIL) (2.5 MG/3ML) 0.083% nebulizer solution Take 2.5 mg by nebulization every 6 (six) hours as needed for wheezing or shortness of breath.     albuterol (VENTOLIN HFA) 108 (90 Base) MCG/ACT inhaler Inhale 2 puffs into the lungs every 6 (six) hours as needed for wheezing or shortness of breath.     aspirin EC 81 MG tablet Take 81 mg  by mouth daily. Swallow whole.     carvedilol (COREG) 6.25 MG tablet Take 1 tablet (6.25 mg total) by mouth 2 (two) times daily with a meal. 180 tablet 3   dapagliflozin propanediol (FARXIGA) 10 MG TABS tablet Take by mouth daily.     diclofenac Sodium (VOLTAREN) 1 % GEL Apply 1 application topically daily.     gabapentin (NEURONTIN) 100 MG capsule Take 1 capsule (100 mg total) by mouth at bedtime as needed (neuropathic pain). 30 capsule 0   insulin aspart (NOVOLOG FLEXPEN) 100 UNIT/ML FlexPen 0-20 Units, Subcutaneous, 3 times daily with meals CBG < 70: Implement Hypoglycemia measures CBG 70 - 120: 0 units CBG 121 - 150: 3 units CBG 151 - 200: 4 units CBG 201 - 250: 7 units CBG 251 - 300: 11 units CBG 301 - 350: 15 units CBG 351 - 400: 20 units CBG > 400: call MD 15 mL 0   linaclotide (LINZESS) 145 MCG CAPS capsule Take 145 mcg by mouth daily as needed (constipation).     losartan (COZAAR) 25 MG tablet Take 1 tablet (25 mg total) by mouth daily. 30 tablet 3   Multiple Vitamin (MULTIVITAMIN WITH MINERALS) TABS tablet Take 1 tablet by mouth daily.     Vitamin D, Ergocalciferol, (DRISDOL) 1.25 MG (50000 UNIT) CAPS capsule Take 50,000 Units by mouth every Monday.     spironolactone (ALDACTONE) 25 MG tablet Take 1 tablet (25 mg total) by mouth daily. 90 tablet 3   torsemide (DEMADEX) 20 MG tablet Take 1 tablet (20 mg total) by mouth daily. 90 tablet 3   No current facility-administered medications for this encounter.   BP 108/60   Pulse 77   Wt 104 kg (229 lb 3.2 oz)   SpO2 98%   BMI 36.99 kg/m  General: NAD Neck: Thick. JVP 8-9 cm, no thyromegaly or thyroid nodule.  Lungs: Clear to auscultation bilaterally with normal respiratory effort. CV: Nondisplaced PMI.  Heart regular S1/S2, no S3/S4, no murmur.  Trace ankle edema.  No carotid bruit.  Normal pedal pulses.  Abdomen: Soft, nontender, no hepatosplenomegaly, no distention.  Skin: Intact without lesions or rashes.  Neurologic: Alert and  oriented x 3.  Psych: Normal affect. Extremities: No clubbing or cyanosis.  HEENT: Normal.   Assessment/Plan: 1. Chronic systolic CHF: Nonischemic cardiomyopathy.  No family history of cardiomyopathy, no heavy ETOH or drugs.  Prior cath with normal coronaries.  He was unable to fit for cardiac MRI.  EF has been low since at least 2008.  Echo in 10/22 showed some improvement with EF up to 35-40% and mildly decreased RV function. NYHA class II symptoms.  I suspect he is mildly volume overloaded on exam today.   - Continue losartan 25 mg daily.  He is not willing to retry Entresto as he thinks that it caused BPPV.  - Increase Coreg to 6.25 mg bid.  - Increase  torsemide to 20 mg daily. BMET today and again in 10 days.  - Increase spironolactone to 25 mg daily.  I told him that spironolactone was not causing his epistaxis episodes (these seem relatively mild).  - Continue Farxiga 10 mg daily.  - He appears to be out of ICD range by last echo.  - Historically have had trouble treating him due to fragmented care and noncompliance with meds.   2. OSA: Prior diagnosis, not on CPAP.  Significant daytime sleepiness.  Will try to get home sleep study.  3. H/o LV thrombus: This was noted on echo in 5/21.  He has not been on anticoagulation since he has re-established with CHF clinic.  With no LV thrombus on recent echo and some improvement in EF, I will not restart anticoagulation.  4. Diabetes: Per PCP. Ideally, should restart on a statin. Will address at future appt.  5. H/o PE: In setting of COVID-19 and MSSA PNA.  As above, he has not been on anticoagulation recently.  Suspect PE was triggered by immobility.    Followup with PA/NP 3 wks.   Marca Ancona 01/15/2021

## 2021-01-24 ENCOUNTER — Other Ambulatory Visit (HOSPITAL_COMMUNITY): Payer: No Typology Code available for payment source

## 2021-01-30 NOTE — Progress Notes (Signed)
Cardiology Office Note:    Date:  01/31/2021   ID:  Kevin Long, DOB 03-16-1961, MRN [id number]  PCP:  Nolene Ebbs, MD   [facility] Providers Cardiologist:  None     Referring MD: Nolene Ebbs, MD   No chief complaint on file.   History of Present Illness:    Kevin Long is a 60 y.o. male with a hx of chronic systolic and diastolic heart failure, diabetes, hyperlipidemia, OSA, and obesity coming in to transfer care. He last saw Dr. Rejeana Brock on 09/21/20 for congestive heart failure. He did have a L-cardiac catheterization in 2014 which showed normal coronaries.  In 2017, his LVEF was 25-30% but he was not able to get a cardiac MRI due to body size. He intermittently stopped all his medication and struggled with vertigo on Entresto and did not retry it. He had followed-up with the heart failure clinic. In 05/2019 he was infected with COVID, PE and MSSA pneumonia. An Echo 07/2019 revealed LVEF <20% with severe RV dysfunction and an LV thrombus. He was not followed for a year but was admitted 08/2020 with heart failure symptoms. At the time he re-established care. An Echo 12/2020 show improved  LVEF 35-40% and no evidence of LV thrombus. He was last seen 12/2020 and stopped spironolactone due to nose bleeds. Carvedilol was increase to 6.25 mg, torsemide was increased, and he was instructed to start taking spironolactone. An out patient sleep study was ordered.   Today, he is accompanied by his wife. Overall, he is doing well. He is getting used to taking his medicine regularly and he is currently tolerating them. He records his blood pressure at home and reports the measurements are low. Additionally, he reports his weight fluctuates which he attributes to fluid retention. Routinely, he does not take hot showers because he experiences lightheadedness. For exercise, he and wife walk every morning and he does daily stretches.He denies any palpitations, chest pain, or shortness of breath,  lightheadedness, headaches, syncope, orthopnea, PND, or exertional symptoms.  Past Medical History:  Diagnosis Date   Abnormal LFTs    Asthma    Bacteremia 09/19/2020   Degeneration of lumbosacral intervertebral disc 09/19/2020   Diabetes mellitus, type 2 ([place])    Essential hypertension 02/09/2007   Qualifier: Diagnosis of  By: Ronnald Ramp CNA/MA, Jessica     Gout 09/19/2020   HFrEF (heart failure with reduced ejection fraction) ([place]) 09/25/2016   Echocardiogram 09/22/20: EF < 20, global HK, Gr 2 DD, elevated LA pressure, D shaped LV c/w RV vol overload, mod reduced RVSF, mild BAE, trivial MR, severe TR   History of COVID-19    Admit in 07/2019 - course c/b pulmonary embolism, pneumonia, R lung mass (?infectious), MSSA bacteremia, a/c CHF, metabolic encephalopathy, AKI   Hx of Pulmonary Embolism 08/12/2019   In setting of COVID-19 pneumonia with post obstructive pneumonia (pt left hosp AMA)   Hypercholesterolemia    Mass of upper lobe of right lung 08/12/2019   during admit for COVID-19 pneumonia   NICM (nonischemic cardiomyopathy) ([facility]) 08/13/2015   Normal Cath in 2014   Obesity    OSA (obstructive sleep apnea)    Pain in joint involving pelvic region and thigh 09/19/2020   Pain in joint, lower leg 09/19/2020   Palpitations 08/13/2015   PVC's (premature ventricular contractions) 03/28/2016   Noted on prior monitor but not quantified   Tobacco use disorder 09/19/2020   Vertigo 09/25/2016    Past Surgical History:  Procedure Laterality Date  FOOT SURGERY     has pin and screws   LEFT AND RIGHT HEART CATHETERIZATION WITH CORONARY ANGIOGRAM N/A 08/04/2012   Procedure: LEFT AND RIGHT HEART CATHETERIZATION WITH CORONARY ANGIOGRAM;  Surgeon: Jettie Booze, MD;  Location: [facility] CATH LAB;  Service: Cardiovascular;  Laterality: N/A;    Current Medications: Current Meds  Medication Sig   albuterol (PROVENTIL) (2.5 MG/3ML) 0.083% nebulizer solution Take 2.5 mg by nebulization every 6  (six) hours as needed for wheezing or shortness of breath.   albuterol (VENTOLIN HFA) 108 (90 Base) MCG/ACT inhaler Inhale 2 puffs into the lungs every 6 (six) hours as needed for wheezing or shortness of breath.   aspirin EC 81 MG tablet Take 81 mg by mouth daily. Swallow whole.   carvedilol (COREG) 6.25 MG tablet Take 1 tablet (6.25 mg total) by mouth 2 (two) times daily with a meal.   dapagliflozin propanediol (FARXIGA) 10 MG TABS tablet Take by mouth daily.   diclofenac Sodium (VOLTAREN) 1 % GEL Apply 1 application topically daily.   gabapentin (NEURONTIN) 100 MG capsule Take 1 capsule (100 mg total) by mouth at bedtime as needed (neuropathic pain).   insulin aspart (NOVOLOG FLEXPEN) 100 UNIT/ML FlexPen 0-20 Units, Subcutaneous, 3 times daily with meals CBG < 70: Implement Hypoglycemia measures CBG 70 - 120: 0 units CBG 121 - 150: 3 units CBG 151 - 200: 4 units CBG 201 - 250: 7 units CBG 251 - 300: 11 units CBG 301 - 350: 15 units CBG 351 - 400: 20 units CBG > 400: call MD   linaclotide (LINZESS) 145 MCG CAPS capsule Take 145 mcg by mouth daily as needed (constipation).   losartan (COZAAR) 25 MG tablet Take 1 tablet (25 mg total) by mouth daily.   Multiple Vitamin (MULTIVITAMIN WITH MINERALS) TABS tablet Take 1 tablet by mouth daily.   spironolactone (ALDACTONE) 25 MG tablet Take 25 mg by mouth daily.   torsemide (DEMADEX) 20 MG tablet Take 1 tablet (20 mg total) by mouth daily.   Vitamin D, Ergocalciferol, (DRISDOL) 1.25 MG (50000 UNIT) CAPS capsule Take 50,000 Units by mouth every Monday.     Allergies:   Celebrex [celecoxib], Rosuvastatin, and Entresto [sacubitril-valsartan]   Social History   Socioeconomic History   Marital status: Single    Spouse name: Not on file   Number of children: Not on file   Years of education: Not on file   Highest education level: Not on file  Occupational History   Not on file  Tobacco Use   Smoking status: Former    Packs/day: 1.00    Years: 25.00     Pack years: 25.00    Types: Cigarettes    Quit date: 03/31/2005    Years since quitting: 15.8   Smokeless tobacco: Never  Vaping Use   Vaping Use: Never used  Substance and Sexual Activity   Alcohol use: No   Drug use: No   Sexual activity: Not on file  Other Topics Concern   Not on file  Social History Narrative   Not on file   Social Determinants of Health   Financial Resource Strain: Not on file  Food Insecurity: Not on file  Transportation Needs: Not on file  Physical Activity: Not on file  Stress: Not on file  Social Connections: Not on file     Family History: The patient's family history includes Anxiety disorder in his brother; Depression in his son; Diabetes in his mother; Heart murmur in his son.  There is no history of Heart attack, Hypertension, or Stroke.  ROS:   Please see the history of present illness.    (+) Edema  All other systems reviewed and are negative.  EKGs/Labs/Other Studies Reviewed:    The following studies were reviewed today: Echo 01/01/21  1. Left ventricular ejection fraction, by estimation, is 35 to 40%. Left  ventricular ejection fraction by 2D MOD biplane is 39.5 %. The left  ventricle has moderately decreased function. The left ventricle  demonstrates global hypokinesis. The left  ventricular internal cavity size was mildly dilated. Left ventricular  diastolic parameters are consistent with Grade II diastolic dysfunction  (pseudonormalization). Elevated left ventricular end-diastolic pressure.  The E/e' is 17.   2. Right ventricular systolic function is mildly reduced. The right  ventricular size is normal. There is normal pulmonary artery systolic  pressure. The estimated right ventricular systolic pressure is [id number] mmHg.   3. The mitral valve is abnormal. Mild mitral valve regurgitation.   4. The aortic valve is tricuspid. Aortic valve regurgitation is not  visualized.   5. The inferior vena cava is normal in size with greater  than 50%  respiratory variability, suggesting right atrial pressure of 3 mmHg.  Comparison(s): Changes from prior study are noted. 09/22/2020: LVEF <20%, global hypokinesis.   Echo 09/22/20  1. Left ventricular ejection fraction, by estimation, is <20%. The left  ventricle has severely decreased function. The left ventricle demonstrates  global hypokinesis. The left ventricular internal cavity size was  moderately dilated. Left ventricular  diastolic parameters are consistent with Grade II diastolic dysfunction  (pseudonormalization). Elevated left atrial pressure. There is the  interventricular septum is flattened in diastole ('D' shaped left  ventricle), consistent with right ventricular  volume overload.   2. Right ventricular systolic function is moderately reduced. The right  ventricular size is moderately enlarged.   3. Left atrial size was mildly dilated.   4. Right atrial size was mildly dilated.   5. The mitral valve is normal in structure. Trivial mitral valve  regurgitation.   6. The tricuspid valve is abnormal. Tricuspid valve regurgitation is  severe.   7. The aortic valve is tricuspid. Aortic valve regurgitation is not  visualized. No aortic stenosis is present.   EKG:  No EKG ordered today  Recent Labs: 09/27/2020: ALT 12; Hemoglobin 10.4; Platelets 288 09/28/2020: Magnesium 2.1 01/14/2021: B Natriuretic Peptide 68.3; BUN 12; Creatinine, Ser 1.25; Potassium 4.3; Sodium 137  Recent Lipid Panel    Component Value Date/Time   CHOL 157 09/25/2020 0216   TRIG 67 09/25/2020 0216   HDL 38 (L) 09/25/2020 0216   CHOLHDL 4.1 09/25/2020 0216   VLDL 13 09/25/2020 0216   LDLCALC 106 (H) 09/25/2020 0216     Physical Exam:    VS:  BP (!) 98/58 (BP Location: Left Arm, Patient Position: Sitting, Cuff Size: Normal)   Pulse 74   Ht 5\' 6"  (1.676 m)   Wt 231 lb 8 oz (105 kg)   SpO2 97%   BMI 37.37 kg/m  , BMI Body mass index is 37.37 kg/m. GENERAL:  Well appearing HEENT:  Pupils equal round and reactive, fundi not visualized, oral mucosa unremarkable NECK:  No jugular venous distention, waveform within normal limits, carotid upstroke brisk and symmetric, no bruits, no thyromegaly LUNGS:  Clear to auscultation bilaterally HEART:  RRR.  PMI not displaced or sustained,S1 and S2 within normal limits, no S3, no S4, no clicks, no rubs, no murmurs ABD:  Flat,  positive bowel sounds normal in frequency in pitch, no bruits, no rebound, no guarding, no midline pulsatile mass, no hepatomegaly, no splenomegaly EXT:  2 plus pulses throughout, no edema, no cyanosis no clubbing SKIN:  No rashes no nodules NEURO:  Cranial nerves II through XII grossly intact, motor grossly intact throughout PSYCH:  Cognitively intact, oriented to person place and time    ASSESSMENT:    1. Therapeutic drug monitoring   2. Essential hypertension   3. Chronic combined systolic and diastolic heart failure (HCC)   4. Obesity due to excess calories, unspecified classification, unspecified whether serious comorbidity present    PLAN:    Essential hypertension Blood pressure is very well-controlled.  In fact, his blood pressure is actually low right now.  He has not had any significant lightheadedness or dizziness.  He is on very low doses of his meds.  Continue carvedilol, spironolactone, and losartan.  Check a basic metabolic panel today given that he just started back on spironolactone.  Chronic combined systolic and diastolic heart failure (HCC) LVEF has improved to 35 to 40%.  He is doing very well and is euvolemic.  BNP was within normal limits.  He restarted spironolactone.  We will check a basic metabolic panel today.  Continue carvedilol, losartan, and torsemide.  He previously had an LV thrombus that has resolved.  He was encouraged to weigh himself each day and to take an extra torsemide if his weight goes up by 2 pounds in a day or 5 pounds over the course of a week.  Obesity Continue  exercising and weight loss.  He is walking with his wife daily and doing stretches.    Medication Adjustments/Labs and Tests Ordered: Current medicines are reviewed at length with the patient today.  Concerns regarding medicines are outlined above.  Orders Placed This Encounter  Procedures   Basic metabolic panel    No orders of the defined types were placed in this encounter.   Patient Instructions  Medication Instructions:  Your physician recommends that you continue on your current medications as directed. Please refer to the Current Medication list given to you today.   *If you need a refill on your cardiac medications before your next appointment, please call your pharmacy*  Lab Work: BMET   If you have labs (blood work) drawn today and your tests are completely normal, you will receive your results only by: [place] (if you have MyChart) OR A paper copy in the mail If you have any lab test that is abnormal or we need to change your treatment, we will call you to review the results.  Testing/Procedures: NONE  Follow-Up: At [facility], you and your health needs are our priority.  As part of our continuing mission to provide you with exceptional heart care, we have created designated Provider Care Teams.  These Care Teams include your primary Cardiologist (physician) and Advanced Practice Providers (APPs -  Physician Assistants and Nurse Practitioners) who all work together to provide you with the care you need, when you need it.  We recommend signing up for the patient portal called "MyChart".  Sign up information is provided on this After Visit Summary.  MyChart is used to connect with patients for Virtual Visits (Telemedicine).  Patients are able to view lab/test results, encounter notes, upcoming appointments, etc.  Non-urgent messages can be sent to your provider as well.   To learn more about what you can do with MyChart, go to [email or web address].  Your  next appointment:   4 month(s)  The format for your next appointment:   In Person  Provider:   Chilton Si, MD  WILL SEND MESSAGE TO HF CLINIC TO FOLLOW UP ON SLEEP STUDY      Disposition: FU with Quintus Premo C. Duke Salvia, MD, [facility] in 4 months  I,Mykaella Javier,acting as a scribe for Chilton Si, MD.,have documented all relevant documentation on the behalf of Chilton Si, MD,as directed by  Chilton Si, MD while in the presence of Chilton Si, MD.  I, Deyona Soza C. Duke Salvia, MD have reviewed all documentation for this visit.  The documentation of the exam, diagnosis, procedures, and orders on 01/31/2021 are all accurate and complete.   Signed, Chilton Si, MD  01/31/2021 1:39 PM    [facility]

## 2021-01-31 ENCOUNTER — Encounter (HOSPITAL_BASED_OUTPATIENT_CLINIC_OR_DEPARTMENT_OTHER): Payer: Self-pay | Admitting: Cardiovascular Disease

## 2021-01-31 ENCOUNTER — Ambulatory Visit (INDEPENDENT_AMBULATORY_CARE_PROVIDER_SITE_OTHER): Payer: Medicaid Other | Admitting: Cardiovascular Disease

## 2021-01-31 ENCOUNTER — Other Ambulatory Visit: Payer: Self-pay

## 2021-01-31 VITALS — BP 98/58 | HR 74 | Ht 66.0 in | Wt 231.5 lb

## 2021-01-31 DIAGNOSIS — I10 Essential (primary) hypertension: Secondary | ICD-10-CM

## 2021-01-31 DIAGNOSIS — E6609 Other obesity due to excess calories: Secondary | ICD-10-CM

## 2021-01-31 DIAGNOSIS — I5042 Chronic combined systolic (congestive) and diastolic (congestive) heart failure: Secondary | ICD-10-CM

## 2021-01-31 DIAGNOSIS — Z5181 Encounter for therapeutic drug level monitoring: Secondary | ICD-10-CM | POA: Diagnosis not present

## 2021-01-31 NOTE — Assessment & Plan Note (Signed)
LVEF has improved to 35 to 40%.  He is doing very well and is euvolemic.  BNP was within normal limits.  He restarted spironolactone.  We will check a basic metabolic panel today.  Continue carvedilol, losartan, and torsemide.  He previously had an LV thrombus that has resolved.  He was encouraged to weigh himself each day and to take an extra torsemide if his weight goes up by 2 pounds in a day or 5 pounds over the course of a week.

## 2021-01-31 NOTE — Assessment & Plan Note (Signed)
Blood pressure is very well-controlled.  In fact, his blood pressure is actually low right now.  He has not had any significant lightheadedness or dizziness.  He is on very low doses of his meds.  Continue carvedilol, spironolactone, and losartan.  Check a basic metabolic panel today given that he just started back on spironolactone.

## 2021-01-31 NOTE — Assessment & Plan Note (Signed)
Continue exercising and weight loss.  He is walking with his wife daily and doing stretches.

## 2021-01-31 NOTE — Patient Instructions (Addendum)
Medication Instructions:  Your physician recommends that you continue on your current medications as directed. Please refer to the Current Medication list given to you today.   *If you need a refill on your cardiac medications before your next appointment, please call your pharmacy*  Lab Work: BMET   If you have labs (blood work) drawn today and your tests are completely normal, you will receive your results only by: MyChart Message (if you have MyChart) OR A paper copy in the mail If you have any lab test that is abnormal or we need to change your treatment, we will call you to review the results.  Testing/Procedures: NONE  Follow-Up: At [facility], you and your health needs are our priority.  As part of our continuing mission to provide you with exceptional heart care, we have created designated Provider Care Teams.  These Care Teams include your primary Cardiologist (physician) and Advanced Practice Providers (APPs -  Physician Assistants and Nurse Practitioners) who all work together to provide you with the care you need, when you need it.  We recommend signing up for the patient portal called "MyChart".  Sign up information is provided on this After Visit Summary.  MyChart is used to connect with patients for Virtual Visits (Telemedicine).  Patients are able to view lab/test results, encounter notes, upcoming appointments, etc.  Non-urgent messages can be sent to your provider as well.   To learn more about what you can do with MyChart, go to [email or web address].    Your next appointment:   4 month(s)  The format for your next appointment:   In Person  Provider:   Chilton Si, MD  WILL SEND MESSAGE TO HF CLINIC TO FOLLOW UP ON SLEEP STUDY

## 2021-02-01 LAB — BASIC METABOLIC PANEL
BUN/Creatinine Ratio: 13 (ref 10–24)
BUN: 21 mg/dL (ref 8–27)
CO2: 25 mmol/L (ref 20–29)
Calcium: 10.4 mg/dL — ABNORMAL HIGH (ref 8.6–10.2)
Chloride: 97 mmol/L (ref 96–106)
Creatinine, Ser: 1.59 mg/dL — ABNORMAL HIGH (ref 0.76–1.27)
Glucose: 217 mg/dL — ABNORMAL HIGH (ref 70–99)
Potassium: 4.6 mmol/L (ref 3.5–5.2)
Sodium: 136 mmol/L (ref 134–144)
eGFR: 49 mL/min/{1.73_m2} — ABNORMAL LOW (ref >59)

## 2021-02-04 ENCOUNTER — Telehealth (HOSPITAL_COMMUNITY): Payer: Self-pay | Admitting: Cardiology

## 2021-02-04 ENCOUNTER — Telehealth (HOSPITAL_COMMUNITY): Payer: Self-pay

## 2021-02-04 NOTE — Progress Notes (Incomplete)
PCP: Dr. Jeanie Cooks HF Cardiology: Dr. Aundra Dubin  60 y.o. with history of HTN, diabetes, and nonischemic cardiomyopathy with chronic systolic CHF.  Patient was diagnosed with a cardiomyopathy in around 2008.  He had left heart cath in 2014 with normal coronaries. He was never a heavy drinker and has not used drugs. No family history of cardiomyopathy.  He is very concerned that his cardiomyopathy came from [facility]. Echo in 5/17 showed EF 25-30%, echo in 8/18 showed EF 25-30% with diffuse hypokinesis. He was unable to fit in MRI for cardiac MRI.   He has stopped cardiac meds multiple times for symptoms that would be unusual as side effects for those medications. He blamed vertigo (BPPV symptoms) on Entresto and will not retry.   He was lost to followup in this clinic for several years.  He was seen by multiple providers in the interim.  In 3/21, he had COVID-19, pulmonary embolus, and MSSA PNA.  Echo in 5/21 showed EF < 20% with severely reduced RV systolic function and LV thrombus.  He was lost to followup for about a year, then admitted in 6/22 with CHF exacerbation.  Echo in 6/22 showed EF < 20%, moderate RV dysfunction.  After this admission, he began followup again in the CHF clinic.   Echo was done in 10/22, EF better at 35-40%, no LV thrombus visualized, mild LV dilation, mildly decreased RV systolic function, mild MR.    Patient returns for followup of CHF.  Weight is stable.  Generally no dyspnea walking on flat ground.  He is short of breath walking up stairs or inclines.  Rare lightheadedness if he stands too fast.  No chest pain. No orthopnea/PND.  He stopped taking spironolactone as he thought it was causing him to have nosebleeds.  He still has occasional nosebleeds despite stopping spironolactone.   Labs (6/17): LDL 155, K 4.6, creatinine 1.19 Labs (10/17): K 3.7, creatinine 1.1, BNP 78 Labs (12/17): K 3.6, creatinine 1.15 Labs (3/18): K 3.8, creatinine 1.09 Labs (7/22): BNP 655 Labs  (8/22): K 3.9, creatinine 1.29  ECG (personally reviewed): NSR, QTc mildly prolonged at 471 msec  PMH: 1. Type II diabetes 2. Hyperlipidemia: has not tolerated atorvastatin or pravastatin.  3. HTN 4. OSA: Not using CPAP.  5. OA: Primarily knees.  6. Chronic systolic CHF: Nonischemic cardiomyopathy.   - Cardiolite 2008 with EF 44%, no ischemia.  - LHC (2014): EF 25%, normal coronaries.  - Echo (1/15): EF 25-30%. - Echo (5/17): EF 25-30%, moderate LV dilation, mild MR, PASP 48 mmHg.  - Echo (3/18): EF 25%, diffuse hypokinesis, normal RV size and systolic function.  - Echo (8/18): EF 25-30%, severe LV dilation, mild MR, moderate RV dilation with mildly decreased systolic function.  - Echo (5/21): EF < 20%, probable apical thrombus, severely decreased RV systolic function.  - Echo (6/22): EF < 20%, moderately decreased RV systolic function.  - Echo (10/22): EF better at 35-40%, no LV thrombus visualized, mild LV dilation, mildly decreased RV systolic function, mild MR.  7. PVCs: Noted by 30 day monitor, not quantified. 8. LV thrombus: Noted in 5/21.  9. COVID-19 in 5/21.  10. MSSA PNA 5/21 11. PE in 5/21 in setting of COVID-19 and MSSA PNA.   SH: Lives in [place], quit smoking in his 63s, no drugs, no ETOH, used to be a Pharmacist, community.   FH: No cardiomyopathy or other cardiac history that he is aware of.  ROS: All systems reviewed and negative except as per HPI.  Current Outpatient Medications  Medication Sig Dispense Refill   albuterol (PROVENTIL) (2.5 MG/3ML) 0.083% nebulizer solution Take 2.5 mg by nebulization every 6 (six) hours as needed for wheezing or shortness of breath.     albuterol (VENTOLIN HFA) 108 (90 Base) MCG/ACT inhaler Inhale 2 puffs into the lungs every 6 (six) hours as needed for wheezing or shortness of breath.     aspirin EC 81 MG tablet Take 81 mg by mouth daily. Swallow whole.     carvedilol (COREG) 6.25 MG tablet Take 1 tablet (6.25 mg total) by mouth 2 (two)  times daily with a meal. 180 tablet 3   dapagliflozin propanediol (FARXIGA) 10 MG TABS tablet Take by mouth daily.     diclofenac Sodium (VOLTAREN) 1 % GEL Apply 1 application topically daily.     gabapentin (NEURONTIN) 100 MG capsule Take 1 capsule (100 mg total) by mouth at bedtime as needed (neuropathic pain). 30 capsule 0   insulin aspart (NOVOLOG FLEXPEN) 100 UNIT/ML FlexPen 0-20 Units, Subcutaneous, 3 times daily with meals CBG < 70: Implement Hypoglycemia measures CBG 70 - 120: 0 units CBG 121 - 150: 3 units CBG 151 - 200: 4 units CBG 201 - 250: 7 units CBG 251 - 300: 11 units CBG 301 - 350: 15 units CBG 351 - 400: 20 units CBG > 400: call MD 15 mL 0   linaclotide (LINZESS) 145 MCG CAPS capsule Take 145 mcg by mouth daily as needed (constipation).     losartan (COZAAR) 25 MG tablet Take 1 tablet (25 mg total) by mouth daily. 30 tablet 3   Multiple Vitamin (MULTIVITAMIN WITH MINERALS) TABS tablet Take 1 tablet by mouth daily.     spironolactone (ALDACTONE) 25 MG tablet Take 25 mg by mouth daily.     torsemide (DEMADEX) 20 MG tablet Take 1 tablet (20 mg total) by mouth daily. 90 tablet 3   Vitamin D, Ergocalciferol, (DRISDOL) 1.25 MG (50000 UNIT) CAPS capsule Take 50,000 Units by mouth every Monday.     No current facility-administered medications for this visit.   There were no vitals taken for this visit. General: NAD Neck: Thick. JVP 8-9 cm, no thyromegaly or thyroid nodule.  Lungs: Clear to auscultation bilaterally with normal respiratory effort. CV: Nondisplaced PMI.  Heart regular S1/S2, no S3/S4, no murmur.  Trace ankle edema.  No carotid bruit.  Normal pedal pulses.  Abdomen: Soft, nontender, no hepatosplenomegaly, no distention.  Skin: Intact without lesions or rashes.  Neurologic: Alert and oriented x 3.  Psych: Normal affect. Extremities: No clubbing or cyanosis.  HEENT: Normal.   Assessment/Plan: 1. Chronic systolic CHF: Nonischemic cardiomyopathy.  No family history of  cardiomyopathy, no heavy ETOH or drugs.  Prior cath with normal coronaries.  He was unable to fit for cardiac MRI.  EF has been low since at least 2008.  Echo in 10/22 showed some improvement with EF up to 35-40% and mildly decreased RV function. NYHA class II symptoms.  I suspect he is mildly volume overloaded on exam today.   - Continue losartan 25 mg daily.  He is not willing to retry Entresto as he thinks that it caused BPPV.  - Increase Coreg to 6.25 mg bid.  - Increase torsemide to 20 mg daily. BMET today and again in 10 days.  - Increase spironolactone to 25 mg daily.  I told him that spironolactone was not causing his epistaxis episodes (these seem relatively mild).  - Continue Farxiga 10 mg daily.  -  He appears to be out of ICD range by last echo.  - Historically have had trouble treating him due to fragmented care and noncompliance with meds.   2. OSA: Prior diagnosis, not on CPAP.  Significant daytime sleepiness.  Will try to get home sleep study.  3. H/o LV thrombus: This was noted on echo in 5/21.  He has not been on anticoagulation since he has re-established with CHF clinic.  With no LV thrombus on recent echo and some improvement in EF, I will not restart anticoagulation.  4. Diabetes: Per PCP. Ideally, should restart on a statin. Will address at future appt.  5. H/o PE: In setting of COVID-19 and MSSA PNA.  As above, he has not been on anticoagulation recently.  Suspect PE was triggered by immobility.    Followup with PA/NP 3 wks.   [facility] 02/04/2021

## 2021-02-04 NOTE — Telephone Encounter (Signed)
Community care referral faxed to [facility] with a request to order sleep study and or CPAP if needed.   DOS request start date 10/24/2020

## 2021-02-04 NOTE — Telephone Encounter (Signed)
Called to confirm/remind patient of their appointment at [facility] on 02/05/21. However he canceled his appointment and will give our office to reschedule.

## 2021-02-05 ENCOUNTER — Encounter (HOSPITAL_COMMUNITY): Payer: No Typology Code available for payment source

## 2021-02-05 ENCOUNTER — Telehealth (HOSPITAL_COMMUNITY): Payer: Self-pay | Admitting: Cardiology

## 2021-02-05 NOTE — Telephone Encounter (Signed)
Opened in error

## 2021-02-19 ENCOUNTER — Telehealth: Payer: Self-pay | Admitting: *Deleted

## 2021-02-19 DIAGNOSIS — Z5181 Encounter for therapeutic drug level monitoring: Secondary | ICD-10-CM

## 2021-02-19 DIAGNOSIS — I10 Essential (primary) hypertension: Secondary | ICD-10-CM

## 2021-02-19 MED ORDER — TORSEMIDE 20 MG PO TABS: ORAL_TABLET | ORAL | 3 refills | Status: DC

## 2021-02-19 NOTE — Telephone Encounter (Signed)
-----   Message from Chilton Si, MD sent at 02/19/2021  3:22 PM EST ----- Kidney function is going in the wrong direction.  Switch torsemide to only Monday Wednesday Friday.  Check a BMP and a BNP in 1 week.

## 2021-02-19 NOTE — Telephone Encounter (Signed)
Advised patient of lab results and medication change, verbalized understanding  Lab orders placed

## 2021-03-14 ENCOUNTER — Encounter (HOSPITAL_BASED_OUTPATIENT_CLINIC_OR_DEPARTMENT_OTHER): Payer: Self-pay

## 2021-03-27 DIAGNOSIS — Z5181 Encounter for therapeutic drug level monitoring: Secondary | ICD-10-CM | POA: Diagnosis not present

## 2021-03-27 DIAGNOSIS — I10 Essential (primary) hypertension: Secondary | ICD-10-CM | POA: Diagnosis not present

## 2021-03-28 LAB — BRAIN NATRIURETIC PEPTIDE: BNP: 35.6 pg/mL (ref 0.0–100.0)

## 2021-03-28 LAB — BASIC METABOLIC PANEL
BUN/Creatinine Ratio: 11 (ref 10–24)
BUN: 16 mg/dL (ref 8–27)
CO2: 21 mmol/L (ref 20–29)
Calcium: 9.9 mg/dL (ref 8.6–10.2)
Chloride: 98 mmol/L (ref 96–106)
Creatinine, Ser: 1.49 mg/dL — ABNORMAL HIGH (ref 0.76–1.27)
Glucose: 371 mg/dL — ABNORMAL HIGH (ref 70–99)
Potassium: 4.6 mmol/L (ref 3.5–5.2)
Sodium: 134 mmol/L (ref 134–144)
eGFR: 53 mL/min/{1.73_m2} — ABNORMAL LOW (ref >59)

## 2021-04-01 ENCOUNTER — Other Ambulatory Visit: Payer: Self-pay | Admitting: Internal Medicine

## 2021-04-01 DIAGNOSIS — E1165 Type 2 diabetes mellitus with hyperglycemia: Secondary | ICD-10-CM | POA: Diagnosis not present

## 2021-04-01 DIAGNOSIS — Z125 Encounter for screening for malignant neoplasm of prostate: Secondary | ICD-10-CM | POA: Diagnosis not present

## 2021-04-01 DIAGNOSIS — I5022 Chronic systolic (congestive) heart failure: Secondary | ICD-10-CM | POA: Diagnosis not present

## 2021-04-01 DIAGNOSIS — K5904 Chronic idiopathic constipation: Secondary | ICD-10-CM | POA: Diagnosis not present

## 2021-04-01 DIAGNOSIS — E559 Vitamin D deficiency, unspecified: Secondary | ICD-10-CM | POA: Diagnosis not present

## 2021-04-01 DIAGNOSIS — I10 Essential (primary) hypertension: Secondary | ICD-10-CM | POA: Diagnosis not present

## 2021-04-01 DIAGNOSIS — J4520 Mild intermittent asthma, uncomplicated: Secondary | ICD-10-CM | POA: Diagnosis not present

## 2021-04-01 DIAGNOSIS — M1000 Idiopathic gout, unspecified site: Secondary | ICD-10-CM | POA: Diagnosis not present

## 2021-04-01 DIAGNOSIS — Z0000 Encounter for general adult medical examination without abnormal findings: Secondary | ICD-10-CM | POA: Diagnosis not present

## 2021-04-01 DIAGNOSIS — E7849 Other hyperlipidemia: Secondary | ICD-10-CM | POA: Diagnosis not present

## 2021-04-03 LAB — COMPLETE METABOLIC PANEL WITH GFR
AG Ratio: 1.7 (calc) (ref 1.0–2.5)
ALT: 15 U/L (ref 9–46)
AST: 23 U/L (ref 10–35)
Albumin: 4.5 g/dL (ref 3.6–5.1)
Alkaline phosphatase (APISO): 102 U/L (ref 35–144)
BUN: 14 mg/dL (ref 7–25)
CO2: 25 mmol/L (ref 20–32)
Calcium: 9.8 mg/dL (ref 8.6–10.3)
Chloride: 103 mmol/L (ref 98–110)
Creat: 1.34 mg/dL (ref 0.70–1.35)
Globulin: 2.7 g/dL (calc) (ref 1.9–3.7)
Glucose, Bld: 93 mg/dL (ref 65–99)
Potassium: 4.4 mmol/L (ref 3.5–5.3)
Sodium: 139 mmol/L (ref 135–146)
Total Bilirubin: 1.1 mg/dL (ref 0.2–1.2)
Total Protein: 7.2 g/dL (ref 6.1–8.1)
eGFR: 60 mL/min/{1.73_m2} (ref >=60)

## 2021-04-03 LAB — CBC
HCT: 43.3 % (ref 38.5–50.0)
Hemoglobin: 14.4 g/dL (ref 13.2–17.1)
MCH: 29.9 pg (ref 27.0–33.0)
MCHC: 33.3 g/dL (ref 32.0–36.0)
MCV: 89.8 fL (ref 80.0–100.0)
MPV: 11.2 fL (ref 7.5–12.5)
Platelets: 357 10*3/uL (ref 140–400)
RBC: 4.82 10*6/uL (ref 4.20–5.80)
RDW: 13.5 % (ref 11.0–15.0)
WBC: 9 10*3/uL (ref 3.8–10.8)

## 2021-04-03 LAB — LIPID PANEL
Cholesterol: 309 mg/dL — ABNORMAL HIGH (ref <200)
HDL: 95 mg/dL (ref >=40)
LDL Cholesterol (Calc): 193 mg/dL (calc) — ABNORMAL HIGH
Non-HDL Cholesterol (Calc): 214 mg/dL (calc) — ABNORMAL HIGH (ref <130)
Total CHOL/HDL Ratio: 3.3 (calc) (ref <5.0)
Triglycerides: 92 mg/dL (ref <150)

## 2021-04-03 LAB — TSH: TSH: 2.9 mIU/L (ref 0.40–4.50)

## 2021-04-03 LAB — PSA: PSA: 1.06 ng/mL (ref <=4.00)

## 2021-04-03 LAB — URIC ACID: Uric Acid, Serum: 7.5 mg/dL (ref 4.0–8.0)

## 2021-04-03 LAB — VITAMIN D 25 HYDROXY (VIT D DEFICIENCY, FRACTURES): Vit D, 25-Hydroxy: 36 ng/mL (ref 30–100)

## 2021-05-31 ENCOUNTER — Encounter (HOSPITAL_BASED_OUTPATIENT_CLINIC_OR_DEPARTMENT_OTHER): Payer: Self-pay | Admitting: Cardiovascular Disease

## 2021-05-31 ENCOUNTER — Other Ambulatory Visit: Payer: Self-pay

## 2021-05-31 ENCOUNTER — Ambulatory Visit (INDEPENDENT_AMBULATORY_CARE_PROVIDER_SITE_OTHER): Payer: Medicaid Other | Admitting: Cardiovascular Disease

## 2021-05-31 VITALS — BP 110/76 | HR 81 | Ht 66.0 in | Wt 245.4 lb

## 2021-05-31 DIAGNOSIS — E7800 Pure hypercholesterolemia, unspecified: Secondary | ICD-10-CM | POA: Diagnosis not present

## 2021-05-31 DIAGNOSIS — R079 Chest pain, unspecified: Secondary | ICD-10-CM | POA: Insufficient documentation

## 2021-05-31 DIAGNOSIS — Z5181 Encounter for therapeutic drug level monitoring: Secondary | ICD-10-CM

## 2021-05-31 DIAGNOSIS — I700 Atherosclerosis of aorta: Secondary | ICD-10-CM | POA: Insufficient documentation

## 2021-05-31 DIAGNOSIS — I493 Ventricular premature depolarization: Secondary | ICD-10-CM | POA: Diagnosis not present

## 2021-05-31 DIAGNOSIS — I10 Essential (primary) hypertension: Secondary | ICD-10-CM

## 2021-05-31 DIAGNOSIS — I5042 Chronic combined systolic (congestive) and diastolic (congestive) heart failure: Secondary | ICD-10-CM

## 2021-05-31 HISTORY — DX: Atherosclerosis of aorta: I70.0

## 2021-05-31 HISTORY — DX: Chest pain, unspecified: R07.9

## 2021-05-31 MED ORDER — ATORVASTATIN CALCIUM 40 MG PO TABS: 40.0000 mg | ORAL_TABLET | Freq: Every day | ORAL | 3 refills | Status: DC

## 2021-05-31 NOTE — Assessment & Plan Note (Signed)
Noted on chest CT.  Continue aspirin.  Lipids are poorly controlled.  Add atorvastatin 40mg .  LDL goal <70. ?

## 2021-05-31 NOTE — Patient Instructions (Signed)
Medication Instructions:  ?START ATORVASTATIN 40 MG DAILY  ? ?*If you need a refill on your cardiac medications before your next appointment, please call your pharmacy* ? ?Lab Work: ?FASTING LP/CMET FIRST OF June  ? ?If you have labs (blood work) drawn today and your tests are completely normal, you will receive your results only by: ?MyChart Message (if you have MyChart) OR ?A paper copy in the mail ?If you have any lab test that is abnormal or we need to change your treatment, we will call you to review the results. ? ?Testing/Procedures: ?Your physician has requested that you have en exercise stress myoview. For further information please visit [email or web address]. Please follow instruction sheet, as given. ?HOLD YOUR CARVEDILOL NIGHT BEFORE AND MORNING OF STRESS TEST ? ?Follow-Up: ?At [facility], you and your health needs are our priority.  As part of our continuing mission to provide you with exceptional heart care, we have created designated Provider Care Teams.  These Care Teams include your primary Cardiologist (physician) and Advanced Practice Providers (APPs -  Physician Assistants and Nurse Practitioners) who all work together to provide you with the care you need, when you need it. ? ?We recommend signing up for the patient portal called "MyChart".  Sign up information is provided on this After Visit Summary.  MyChart is used to connect with patients for Virtual Visits (Telemedicine).  Patients are able to view lab/test results, encounter notes, upcoming appointments, etc.  Non-urgent messages can be sent to your provider as well.   ?To learn more about what you can do with MyChart, go to [email or web address].   ? ?Your next appointment:   ?6 month(s) ? ?The format for your next appointment:   ?In Person ? ?Provider:   ?Skeet Latch, MD{ ? ?

## 2021-05-31 NOTE — Assessment & Plan Note (Signed)
LVEF 35 to 40%.  He is euvolemic and doing well from a volume standpoint.  He has gained weight but this does not seem to be fluid weight.  He is going to work on increasing his exercise and limiting his carbohydrate intake to try to lose some weight.  Continue carvedilol, losartan, spironolactone, and torsemide. ?

## 2021-05-31 NOTE — Assessment & Plan Note (Signed)
He was congratulated on his exercise and encouraged to get at least 150 minutes weekly.  We also discussed the importance of limiting carbohydrate intake.  He expressed understanding. ?

## 2021-05-31 NOTE — Progress Notes (Signed)
Cardiology Office Note:    Date:  05/31/2021   ID:  Kevin Long, DOB 1960-08-04, MRN [id number]  PCP:  Nolene Ebbs, MD   [facility] Providers Cardiologist:  None     Referring MD: Nolene Ebbs, MD   No chief complaint on file.   History of Present Illness:    Kevin Long is a 61 y.o. male with a hx of chronic systolic and diastolic heart failure, aortic atherosclerosis, diabetes, hyperlipidemia, OSA, and obesity here for follow-up. He had a LHC in 2014 which showed normal coronaries.  In 2017, his LVEF was 25-30% but he was not able to get a cardiac MRI due to body size. He intermittently stopped all his medication and struggled with vertigo on Entresto and did not retry it. He had followed-up with the heart failure clinic. In 05/2019 he was infected with COVID, PE and MSSA pneumonia. An Echo 07/2019 revealed LVEF <20% with severe RV dysfunction and an LV thrombus. He was not followed for a year but was admitted 08/2020 with heart failure symptoms. At the time he re-established care. An Echo 12/2020 show improved  LVEF 35-40% and no evidence of LV thrombus. He was last seen 12/2020 and stopped spironolactone because he felt it was causing nose bleeds. Carvedilol was increase to 6.25 mg, torsemide was increased, and he was asked to start back taking spironolactone. An out patient sleep study was ordered.   At his last appointment he was doing well other than some occasional fluid retention.  He was encouraged to keep exercising.  BNP was 35.6 on 02/2021.  He reports some occasional chest discomfort.  One episode occurred when watching tv.  He felt pressure in his chest.  There was no shortness of breath or nausea.  It occurred a couple times with walking.  He has been walking every other day for about a mile.  He has no LE edema, orthopnea or PND.  He notes that he has been drinking a lot of juice and eating 10 chocolate bars every night.  Past Medical History:  Diagnosis Date    Abnormal LFTs    Aortic atherosclerosis ([place]) 05/31/2021   Asthma    Bacteremia 09/19/2020   Chest pain of uncertain etiology 0000000   Degeneration of lumbosacral intervertebral disc 09/19/2020   Diabetes mellitus, type 2 ([place])    Essential hypertension 02/09/2007   Qualifier: Diagnosis of  By: Ronnald Ramp CNA/MA, Jessica     Gout 09/19/2020   HFrEF (heart failure with reduced ejection fraction) ([place]) 09/25/2016   Echocardiogram 09/22/20: EF < 20, global HK, Gr 2 DD, elevated LA pressure, D shaped LV c/w RV vol overload, mod reduced RVSF, mild BAE, trivial MR, severe TR   History of COVID-19    Admit in 07/2019 - course c/b pulmonary embolism, pneumonia, R lung mass (?infectious), MSSA bacteremia, a/c CHF, metabolic encephalopathy, AKI   Hx of Pulmonary Embolism 08/12/2019   In setting of COVID-19 pneumonia with post obstructive pneumonia (pt left hosp AMA)   Hypercholesterolemia    Mass of upper lobe of right lung 08/12/2019   during admit for COVID-19 pneumonia   Morbid obesity ([place]) 04/21/2013   NICM (nonischemic cardiomyopathy) ([place]) 08/13/2015   Normal Cath in 2014   Obesity    OSA (obstructive sleep apnea)    Pain in joint involving pelvic region and thigh 09/19/2020   Pain in joint, lower leg 09/19/2020   Palpitations 08/13/2015   PVC's (premature ventricular contractions) 03/28/2016   Noted on prior monitor  but not quantified   Tobacco use disorder 09/19/2020   Vertigo 09/25/2016    Past Surgical History:  Procedure Laterality Date   FOOT SURGERY     has pin and screws   LEFT AND RIGHT HEART CATHETERIZATION WITH CORONARY ANGIOGRAM N/A 08/04/2012   Procedure: LEFT AND RIGHT HEART CATHETERIZATION WITH CORONARY ANGIOGRAM;  Surgeon: Jettie Booze, MD;  Location: [facility] CATH LAB;  Service: Cardiovascular;  Laterality: N/A;    Current Medications: Current Meds  Medication Sig   albuterol (PROVENTIL) (2.5 MG/3ML) 0.083% nebulizer solution Take 2.5 mg by nebulization every 6 (six)  hours as needed for wheezing or shortness of breath.   albuterol (VENTOLIN HFA) 108 (90 Base) MCG/ACT inhaler Inhale 2 puffs into the lungs every 6 (six) hours as needed for wheezing or shortness of breath.   aspirin EC 81 MG tablet Take 81 mg by mouth daily. Swallow whole.   atorvastatin (LIPITOR) 40 MG tablet Take 1 tablet (40 mg total) by mouth daily.   carvedilol (COREG) 6.25 MG tablet Take 1 tablet (6.25 mg total) by mouth 2 (two) times daily with a meal.   dapagliflozin propanediol (FARXIGA) 10 MG TABS tablet Take by mouth daily.   diclofenac Sodium (VOLTAREN) 1 % GEL Apply 1 application topically daily.   insulin aspart (NOVOLOG FLEXPEN) 100 UNIT/ML FlexPen 0-20 Units, Subcutaneous, 3 times daily with meals CBG < 70: Implement Hypoglycemia measures CBG 70 - 120: 0 units CBG 121 - 150: 3 units CBG 151 - 200: 4 units CBG 201 - 250: 7 units CBG 251 - 300: 11 units CBG 301 - 350: 15 units CBG 351 - 400: 20 units CBG > 400: call MD   linaclotide (LINZESS) 145 MCG CAPS capsule Take 145 mcg by mouth daily as needed (constipation).   losartan (COZAAR) 25 MG tablet Take 1 tablet (25 mg total) by mouth daily.   Multiple Vitamin (MULTIVITAMIN WITH MINERALS) TABS tablet Take 1 tablet by mouth daily.   spironolactone (ALDACTONE) 25 MG tablet Take 25 mg by mouth daily.   torsemide (DEMADEX) 20 MG tablet Take 1 tablet Monday, Wednesday, and Friday only     Allergies:   Celebrex [celecoxib], Rosuvastatin, and Entresto [sacubitril-valsartan]   Social History   Socioeconomic History   Marital status: Single    Spouse name: Not on file   Number of children: Not on file   Years of education: Not on file   Highest education level: Not on file  Occupational History   Not on file  Tobacco Use   Smoking status: Former    Packs/day: 1.00    Years: 25.00    Pack years: 25.00    Types: Cigarettes    Quit date: 03/31/2005    Years since quitting: 16.1   Smokeless tobacco: Never  Vaping Use   Vaping  Use: Never used  Substance and Sexual Activity   Alcohol use: No   Drug use: No   Sexual activity: Not on file  Other Topics Concern   Not on file  Social History Narrative   Not on file   Social Determinants of Health   Financial Resource Strain: Not on file  Food Insecurity: Not on file  Transportation Needs: Not on file  Physical Activity: Not on file  Stress: Not on file  Social Connections: Not on file     Family History: The patient's family history includes Anxiety disorder in his brother; Depression in his son; Diabetes in his mother; Heart murmur in his son.  There is no history of Heart attack, Hypertension, or Stroke.  ROS:   Please see the history of present illness.    (+) Edema  All other systems reviewed and are negative.  EKGs/Labs/Other Studies Reviewed:    The following studies were reviewed today: Echo 01/01/21  1. Left ventricular ejection fraction, by estimation, is 35 to 40%. Left  ventricular ejection fraction by 2D MOD biplane is 39.5 %. The left  ventricle has moderately decreased function. The left ventricle  demonstrates global hypokinesis. The left  ventricular internal cavity size was mildly dilated. Left ventricular  diastolic parameters are consistent with Grade II diastolic dysfunction  (pseudonormalization). Elevated left ventricular end-diastolic pressure.  The E/e' is 4.   2. Right ventricular systolic function is mildly reduced. The right  ventricular size is normal. There is normal pulmonary artery systolic  pressure. The estimated right ventricular systolic pressure is [id number] mmHg.   3. The mitral valve is abnormal. Mild mitral valve regurgitation.   4. The aortic valve is tricuspid. Aortic valve regurgitation is not  visualized.   5. The inferior vena cava is normal in size with greater than 50%  respiratory variability, suggesting right atrial pressure of 3 mmHg.  Comparison(s): Changes from prior study are noted. 09/22/2020: LVEF  <20%, global hypokinesis.   Echo 09/22/20  1. Left ventricular ejection fraction, by estimation, is <20%. The left  ventricle has severely decreased function. The left ventricle demonstrates  global hypokinesis. The left ventricular internal cavity size was  moderately dilated. Left ventricular  diastolic parameters are consistent with Grade II diastolic dysfunction  (pseudonormalization). Elevated left atrial pressure. There is the  interventricular septum is flattened in diastole ('D' shaped left  ventricle), consistent with right ventricular  volume overload.   2. Right ventricular systolic function is moderately reduced. The right  ventricular size is moderately enlarged.   3. Left atrial size was mildly dilated.   4. Right atrial size was mildly dilated.   5. The mitral valve is normal in structure. Trivial mitral valve  regurgitation.   6. The tricuspid valve is abnormal. Tricuspid valve regurgitation is  severe.   7. The aortic valve is tricuspid. Aortic valve regurgitation is not  visualized. No aortic stenosis is present.   EKG:  No EKG ordered today  Recent Labs: 09/28/2020: Magnesium 2.1 03/27/2021: BNP 35.6 04/01/2021: ALT 15; BUN 14; Creat 1.34; Hemoglobin 14.4; Platelets 357; Potassium 4.4; Sodium 139; TSH 2.90  Recent Lipid Panel    Component Value Date/Time   CHOL 309 (H) 04/01/2021 1422   TRIG 92 04/01/2021 1422   HDL 95 04/01/2021 1422   CHOLHDL 3.3 04/01/2021 1422   VLDL 13 09/25/2020 0216   LDLCALC 193 (H) 04/01/2021 1422     Physical Exam:    VS:  BP 110/76 (BP Location: Left Arm, Patient Position: Sitting, Cuff Size: Large)    Pulse 81    Ht 5\' 6"  (1.676 m)    Wt 245 lb 6.4 oz (111.3 kg)    SpO2 94%    BMI 39.61 kg/m  , BMI Body mass index is 39.61 kg/m. GENERAL:  Well appearing HEENT: Pupils equal round and reactive, fundi not visualized, oral mucosa unremarkable NECK:  No jugular venous distention, waveform within normal limits, carotid upstroke  brisk and symmetric, no bruits, no thyromegaly LUNGS:  Clear to auscultation bilaterally HEART:  RRR.  PMI not displaced or sustained,S1 and S2 within normal limits, no S3, no S4, no clicks, no rubs, no murmurs  ABD:  Flat, positive bowel sounds normal in frequency in pitch, no bruits, no rebound, no guarding, no midline pulsatile mass, no hepatomegaly, no splenomegaly EXT:  2 plus pulses throughout, no edema, no cyanosis no clubbing SKIN:  No rashes no nodules NEURO:  Cranial nerves II through XII grossly intact, motor grossly intact throughout PSYCH:  Cognitively intact, oriented to person place and time   ASSESSMENT:    1. Essential hypertension   2. Chronic combined systolic and diastolic heart failure ([place])   3. PVC's (premature ventricular contractions)   4. Therapeutic drug monitoring   5. Chest pain of uncertain etiology   6. Pure hypercholesterolemia   7. Morbid obesity ([place])     PLAN:    Chronic combined systolic and diastolic heart failure (HCC) LVEF 35 to 40%.  He is euvolemic and doing well from a volume standpoint.  He has gained weight but this does not seem to be fluid weight.  He is going to work on increasing his exercise and limiting his carbohydrate intake to try to lose some weight.  Continue carvedilol, losartan, spironolactone, and torsemide.  PVC's (premature ventricular contractions) Stable on carvedilol.  Chest pain of uncertain etiology He has chest discomfort both at rest and with exertion.  We will get an exercise Myoview to assess for ischemia.  Continue aspirin.  Aortic atherosclerosis ([place]) Noted on chest CT.  Continue aspirin.  Lipids are poorly controlled.  Add atorvastatin 40mg .  LDL goal <70.  Hyperlipidemia Lipids are very poorly controlled.  He did not tolerate rosuvastatin in the past due to hives.  We will try atorvastatin 40 mg.  LDL goal is less than 70.  Repeat lipids and a CMP in 3 months.  We also discussed importance of limiting fried  foods, fatty foods, red meat, and cheese.  Morbid obesity ([place]) He was congratulated on his exercise and encouraged to get at least 150 minutes weekly.  We also discussed the importance of limiting carbohydrate intake.  He expressed understanding.     Medication Adjustments/Labs and Tests Ordered: Current medicines are reviewed at length with the patient today.  Concerns regarding medicines are outlined above.  Orders Placed This Encounter  Procedures   Lipid panel   Comprehensive metabolic panel   MYOCARDIAL PERFUSION IMAGING    Meds ordered this encounter  Medications   atorvastatin (LIPITOR) 40 MG tablet    Sig: Take 1 tablet (40 mg total) by mouth daily.    Dispense:  90 tablet    Refill:  3    Patient Instructions  Medication Instructions:  START ATORVASTATIN 40 MG DAILY   *If you need a refill on your cardiac medications before your next appointment, please call your pharmacy*  Lab Work: FASTING LP/CMET FIRST OF June   If you have labs (blood work) drawn today and your tests are completely normal, you will receive your results only by: [place] (if you have MyChart) OR A paper copy in the mail If you have any lab test that is abnormal or we need to change your treatment, we will call you to review the results.  Testing/Procedures: Your physician has requested that you have en exercise stress myoview. For further information please visit [email or web address]. Please follow instruction sheet, as given. HOLD YOUR CARVEDILOL NIGHT BEFORE AND MORNING OF STRESS TEST  Follow-Up: At [facility], you and your health needs are our priority.  As part of our continuing mission to provide you with exceptional heart care, we have created designated  Provider Care Teams.  These Care Teams include your primary Cardiologist (physician) and Advanced Practice Providers (APPs -  Physician Assistants and Nurse Practitioners) who all work together to provide you with the care you  need, when you need it.  We recommend signing up for the patient portal called "MyChart".  Sign up information is provided on this After Visit Summary.  MyChart is used to connect with patients for Virtual Visits (Telemedicine).  Patients are able to view lab/test results, encounter notes, upcoming appointments, etc.  Non-urgent messages can be sent to your provider as well.   To learn more about what you can do with MyChart, go to [email or web address].    Your next appointment:   6 month(s)  The format for your next appointment:   In Person  Provider:   Skeet Latch, MD{    Disposition: FU with Kaylena Pacifico C. Oval Linsey, MD, [facility] in 4 months   Signed, Skeet Latch, MD  05/31/2021 9:28 AM    [place]

## 2021-05-31 NOTE — Assessment & Plan Note (Signed)
Stable on carvedilol. ?

## 2021-05-31 NOTE — Assessment & Plan Note (Addendum)
Lipids are very poorly controlled.  He did not tolerate rosuvastatin in the past due to hives.  We will try atorvastatin 40 mg.  LDL goal is less than 70.  Repeat lipids and a CMP in 3 months.  We also discussed importance of limiting fried foods, fatty foods, red meat, and cheese. ?

## 2021-05-31 NOTE — Assessment & Plan Note (Signed)
He has chest discomfort both at rest and with exertion.  We will get an exercise Myoview to assess for ischemia.  Continue aspirin. ?

## 2021-06-03 ENCOUNTER — Telehealth (HOSPITAL_COMMUNITY): Payer: Self-pay | Admitting: *Deleted

## 2021-06-03 NOTE — Telephone Encounter (Signed)
Patient given detailed instructions per Myocardial Perfusion Study Information Sheet for the test on 06/10/21 at 10:45. Patient notified to arrive 15 minutes early and that it is imperative to arrive on time for appointment to keep from having the test rescheduled. ? If you need to cancel or reschedule your appointment, please call the office within 24 hours of your appointment. . Patient verbalized understanding.Kevin Long S ? ? ?

## 2021-06-10 ENCOUNTER — Ambulatory Visit (HOSPITAL_COMMUNITY): Payer: Medicaid Other | Attending: Cardiology

## 2021-06-10 ENCOUNTER — Other Ambulatory Visit: Payer: Self-pay

## 2021-06-10 DIAGNOSIS — I10 Essential (primary) hypertension: Secondary | ICD-10-CM | POA: Insufficient documentation

## 2021-06-10 DIAGNOSIS — I493 Ventricular premature depolarization: Secondary | ICD-10-CM | POA: Insufficient documentation

## 2021-06-10 DIAGNOSIS — R079 Chest pain, unspecified: Secondary | ICD-10-CM | POA: Insufficient documentation

## 2021-06-10 LAB — MYOCARDIAL PERFUSION IMAGING
Estimated workload: 4.6
Exercise duration (min): 2 min
Exercise duration (sec): 23 s
LV dias vol: 198 mL (ref 62–150)
LV sys vol: 138 mL
MPHR: 158 {beats}/min
Nuc Stress EF: 30 %
Peak HR: 127 {beats}/min
Percent HR: 80 %
RPE: 20
Rest HR: 64 {beats}/min
Rest Nuclear Isotope Dose: 10.2 mCi
SDS: 3
SRS: 4
SSS: 8
ST Depression (mm): 0 mm
Stress Nuclear Isotope Dose: 33 mCi
TID: 0.98

## 2021-06-10 MED ORDER — TECHNETIUM TC 99M TETROFOSMIN IV KIT
33.0000 | PACK | Freq: Once | INTRAVENOUS | Status: AC | PRN
  Administered 2021-06-10: 33 via INTRAVENOUS
  Filled 2021-06-10: qty 33

## 2021-06-10 MED ORDER — REGADENOSON 0.4 MG/5ML IV SOLN
0.4000 mg | Freq: Once | INTRAVENOUS | Status: AC
  Administered 2021-06-10: 0.4 mg via INTRAVENOUS

## 2021-06-10 MED ORDER — TECHNETIUM TC 99M TETROFOSMIN IV KIT
10.2000 | PACK | Freq: Once | INTRAVENOUS | Status: AC | PRN
  Administered 2021-06-10: 10.2 via INTRAVENOUS
  Filled 2021-06-10: qty 11

## 2021-06-26 ENCOUNTER — Ambulatory Visit (INDEPENDENT_AMBULATORY_CARE_PROVIDER_SITE_OTHER): Payer: Medicaid Other | Admitting: Internal Medicine

## 2021-06-26 ENCOUNTER — Encounter: Payer: Self-pay | Admitting: Internal Medicine

## 2021-06-26 VITALS — BP 130/90 | HR 80 | Temp 98.2°F | Ht 66.0 in | Wt 247.4 lb

## 2021-06-26 DIAGNOSIS — I10 Essential (primary) hypertension: Secondary | ICD-10-CM

## 2021-06-26 DIAGNOSIS — E782 Mixed hyperlipidemia: Secondary | ICD-10-CM

## 2021-06-26 DIAGNOSIS — N182 Chronic kidney disease, stage 2 (mild): Secondary | ICD-10-CM | POA: Diagnosis not present

## 2021-06-26 DIAGNOSIS — I700 Atherosclerosis of aorta: Secondary | ICD-10-CM | POA: Diagnosis not present

## 2021-06-26 DIAGNOSIS — I5042 Chronic combined systolic (congestive) and diastolic (congestive) heart failure: Secondary | ICD-10-CM

## 2021-06-26 DIAGNOSIS — Z794 Long term (current) use of insulin: Secondary | ICD-10-CM

## 2021-06-26 DIAGNOSIS — E1165 Type 2 diabetes mellitus with hyperglycemia: Secondary | ICD-10-CM

## 2021-06-26 LAB — POCT GLYCOSYLATED HEMOGLOBIN (HGB A1C): Hemoglobin A1C: 9.7 % — AB (ref 4.0–5.6)

## 2021-06-26 MED ORDER — "PEN NEEDLES 1/2"" 29G X 12MM MISC": 3 refills | Status: DC

## 2021-06-26 MED ORDER — OZEMPIC (0.25 OR 0.5 MG/DOSE) 2 MG/1.5ML ~~LOC~~ SOPN: 0.5000 mg | PEN_INJECTOR | SUBCUTANEOUS | 2 refills | Status: DC

## 2021-06-26 MED ORDER — ATORVASTATIN CALCIUM 40 MG PO TABS: 40.0000 mg | ORAL_TABLET | Freq: Every day | ORAL | 1 refills | Status: DC

## 2021-06-26 NOTE — Progress Notes (Signed)
? ? ?New Patient Office Visit ? ? ? ? ?This visit occurred during the SARS-CoV-2 public health emergency.  Safety protocols were in place, including screening questions prior to the visit, additional usage of staff PPE, and extensive cleaning of exam room while observing appropriate contact time as indicated for disinfecting solutions.  ? ? ?CC/Reason for Visit: Establish care, discuss chronic medical conditions ?Previous PCP: Nolene Ebbs, MD ?Last Visit: Unknown ? ?HPI: Kevin Long is a 61 y.o. male who is coming in today for the above mentioned reasons. Past Medical History is significant for: Hypertension, hyperlipidemia, type 2 diabetes that has been uncontrolled, combined heart failure with nonischemic cardiomyopathy followed by Dr. Oval Linsey.  He had a PE after COVID infection in 2021.  He also suffers with erectile dysfunction.  He is a retired Scientist, research (life sciences), is married, has a grown son.  He was a smoker of about a pack a day for 20 years but quit over 15 years ago.  Drinks alcohol only occasionally.  He has no past surgical history, no family history of significance.  He states he had a reaction to a statin that was recently prescribed by Dr. Oval Linsey however he does not recall the reaction.  He only took it for 2 days.  Upon chart review based on his creatinine level he also appears to have stage II-III chronic kidney disease with a GFR in the upper 50s to low 60s.  For management of diabetes he is on farxiga 10 mg daily and NovoLog sliding scale that he cannot fully describe to me today.  He has no acute complaints today. ? ? ?Past Medical/Surgical History: ?Past Medical History:  ?Diagnosis Date  ? Abnormal LFTs   ? Aortic atherosclerosis ([place]) 05/31/2021  ? Asthma   ? Bacteremia 09/19/2020  ? Chest pain of uncertain etiology 0000000  ? Degeneration of lumbosacral intervertebral disc 09/19/2020  ? Diabetes mellitus, type 2 ([place])   ? Essential hypertension 02/09/2007  ? Qualifier: Diagnosis of  By: Ronnald Ramp  CNA/MA, Janett Billow    ? Gout 09/19/2020  ? HFrEF (heart failure with reduced ejection fraction) ([place]) 09/25/2016  ? Echocardiogram 09/22/20: EF < 20, global HK, Gr 2 DD, elevated LA pressure, D shaped LV c/w RV vol overload, mod reduced RVSF, mild BAE, trivial MR, severe TR  ? History of COVID-19   ? Admit in 07/2019 - course c/b pulmonary embolism, pneumonia, R lung mass (?infectious), MSSA bacteremia, a/c CHF, metabolic encephalopathy, AKI  ? Hx of Pulmonary Embolism 08/12/2019  ? In setting of COVID-19 pneumonia with post obstructive pneumonia (pt left hosp AMA)  ? Hypercholesterolemia   ? Mass of upper lobe of right lung 08/12/2019  ? during admit for COVID-19 pneumonia  ? Morbid obesity ([place]) 04/21/2013  ? NICM (nonischemic cardiomyopathy) ([place]) 08/13/2015  ? Normal Cath in 2014  ? Obesity   ? OSA (obstructive sleep apnea)   ? Pain in joint involving pelvic region and thigh 09/19/2020  ? Pain in joint, lower leg 09/19/2020  ? Palpitations 08/13/2015  ? PVC's (premature ventricular contractions) 03/28/2016  ? Noted on prior monitor but not quantified  ? Tobacco use disorder 09/19/2020  ? Vertigo 09/25/2016  ? ? ?Past Surgical History:  ?Procedure Laterality Date  ? FOOT SURGERY    ? has pin and screws  ? LEFT AND RIGHT HEART CATHETERIZATION WITH CORONARY ANGIOGRAM N/A 08/04/2012  ? Procedure: LEFT AND RIGHT HEART CATHETERIZATION WITH CORONARY ANGIOGRAM;  Surgeon: Jettie Booze, MD;  Location: [facility] CATH LAB;  Service: Cardiovascular;  Laterality: N/A;  ? ? ?Social History: ? reports that he quit smoking about 16 years ago. His smoking use included cigarettes. He has a 25.00 pack-year smoking history. He has never used smokeless tobacco. He reports that he does not drink alcohol and does not use drugs. ? ?Allergies: ?Allergies  ?Allergen Reactions  ? Celebrex [Celecoxib] Swelling  ?  Swelling in face  ? Rosuvastatin Hives  ? Entresto [Sacubitril-Valsartan] Other (See Comments)  ?  Dizziness  ? ? ?Family History:   ?Family History  ?Problem Relation Age of Onset  ? Diabetes Mother   ? Anxiety disorder Brother   ? Depression Son   ? Heart murmur Son   ? Heart attack Neg Hx   ? Hypertension Neg Hx   ? Stroke Neg Hx   ? ? ? ?Current Outpatient Medications:  ?  albuterol (PROVENTIL) (2.5 MG/3ML) 0.083% nebulizer solution, Take 2.5 mg by nebulization every 6 (six) hours as needed for wheezing or shortness of breath., Disp: , Rfl:  ?  albuterol (VENTOLIN HFA) 108 (90 Base) MCG/ACT inhaler, Inhale 2 puffs into the lungs every 6 (six) hours as needed for wheezing or shortness of breath., Disp: , Rfl:  ?  aspirin EC 81 MG tablet, Take 81 mg by mouth daily. Swallow whole., Disp: , Rfl:  ?  carvedilol (COREG) 6.25 MG tablet, Take 1 tablet (6.25 mg total) by mouth 2 (two) times daily with a meal., Disp: 180 tablet, Rfl: 3 ?  dapagliflozin propanediol (FARXIGA) 10 MG TABS tablet, Take by mouth daily., Disp: , Rfl:  ?  diclofenac Sodium (VOLTAREN) 1 % GEL, Apply 1 application topically daily., Disp: , Rfl:  ?  insulin aspart (NOVOLOG FLEXPEN) 100 UNIT/ML FlexPen, 0-20 Units, Subcutaneous, 3 times daily with meals CBG < 70: Implement Hypoglycemia measures CBG 70 - 120: 0 units CBG 121 - 150: 3 units CBG 151 - 200: 4 units CBG 201 - 250: 7 units CBG 251 - 300: 11 units CBG 301 - 350: 15 units CBG 351 - 400: 20 units CBG > 400: call MD, Disp: 15 mL, Rfl: 0 ?  linaclotide (LINZESS) 145 MCG CAPS capsule, Take 145 mcg by mouth daily as needed (constipation)., Disp: , Rfl:  ?  losartan (COZAAR) 25 MG tablet, Take 1 tablet (25 mg total) by mouth daily., Disp: 30 tablet, Rfl: 3 ?  Multiple Vitamin (MULTIVITAMIN WITH MINERALS) TABS tablet, Take 1 tablet by mouth daily., Disp: , Rfl:  ?  Semaglutide,0.25 or 0.5MG /DOS, (OZEMPIC, 0.25 OR 0.5 MG/DOSE,) 2 MG/1.5ML SOPN, Inject 0.5 mg into the skin once a week. Start at 0.35 mg injected weekly for 4 weeks, then increase to 0.5 mg weekly., Disp: 1.5 mL, Rfl: 2 ?  spironolactone (ALDACTONE) 25 MG tablet,  Take 25 mg by mouth daily., Disp: , Rfl:  ?  torsemide (DEMADEX) 20 MG tablet, Take 1 tablet Monday, Wednesday, and Friday only, Disp: 40 tablet, Rfl: 3 ?  atorvastatin (LIPITOR) 40 MG tablet, Take 1 tablet (40 mg total) by mouth daily., Disp: 90 tablet, Rfl: 1 ?  Insulin Pen Needle (PEN NEEDLES 29GX1/2") 29G X 12MM MISC, Use to check glucose 1 time daily.  Dx E11.9, Disp: 100 each, Rfl: 3 ? ?Review of Systems:  ?Constitutional: Denies fever, chills, diaphoresis, appetite change and fatigue.  ?HEENT: Denies photophobia, eye pain, redness, hearing loss, ear pain, congestion, sore throat, rhinorrhea, sneezing, mouth sores, trouble swallowing, neck pain, neck stiffness and tinnitus.   ?Respiratory: Denies SOB, DOE, cough,  chest tightness,  and wheezing.   ?Cardiovascular: Denies chest pain, palpitations and leg swelling.  ?Gastrointestinal: Denies nausea, vomiting, abdominal pain, diarrhea, constipation, blood in stool and abdominal distention.  ?Genitourinary: Denies dysuria, urgency, frequency, hematuria, flank pain and difficulty urinating.  ?Endocrine: Denies: hot or cold intolerance, sweats, changes in hair or nails, polyuria, polydipsia. ?Musculoskeletal: Denies myalgias, back pain, joint swelling, arthralgias and gait problem.  ?Skin: Denies pallor, rash and wound.  ?Neurological: Denies dizziness, seizures, syncope, weakness, light-headedness, numbness and headaches.  ?Hematological: Denies adenopathy. Easy bruising, personal or family bleeding history  ?Psychiatric/Behavioral: Denies suicidal ideation, mood changes, confusion, nervousness, sleep disturbance and agitation ? ? ? ?Physical Exam: ?Vitals:  ? 06/26/21 0957  ?BP: 130/90  ?Pulse: 80  ?Temp: 98.2 ?F (36.8 ?C)  ?TempSrc: Oral  ?SpO2: 98%  ?Weight: 247 lb 6.4 oz (112.2 kg)  ?Height: 5\' 6"  (1.676 m)  ? ?Body mass index is 39.93 kg/m?. ? ?Constitutional: NAD, calm, comfortable, obese ?Eyes: PERRL, lids and conjunctivae normal ?ENMT: Mucous membranes are  moist.  ?Respiratory: clear to auscultation bilaterally, no wheezing, no crackles. Normal respiratory effort. No accessory muscle use.  ?Cardiovascular: Regular rate and rhythm, no murmurs / rubs / gallops.

## 2021-06-26 NOTE — Patient Instructions (Signed)
-  Nice seeing you today!! ? ?-Restart atorvastatin 40 mg daily. ? ?-Start semaglutide 0.25 mg injected weekly for the first 4 weeks and then increase to 0.5 mg weekly. ? ?-Schedule follow up in 3 months. ?

## 2021-06-27 ENCOUNTER — Other Ambulatory Visit: Payer: Self-pay | Admitting: *Deleted

## 2021-06-27 ENCOUNTER — Encounter: Payer: Self-pay | Admitting: Internal Medicine

## 2021-06-27 MED ORDER — FREESTYLE LIBRE 2 SENSOR MISC: 1.0000 | 3 refills | Status: DC

## 2021-06-27 NOTE — Telephone Encounter (Signed)
Spoke with someone at [facility] and prescription should be faxed to [phone]. ?

## 2021-07-01 ENCOUNTER — Other Ambulatory Visit (HOSPITAL_BASED_OUTPATIENT_CLINIC_OR_DEPARTMENT_OTHER): Payer: Self-pay | Admitting: *Deleted

## 2021-07-01 DIAGNOSIS — E7800 Pure hypercholesterolemia, unspecified: Secondary | ICD-10-CM

## 2021-07-01 DIAGNOSIS — I10 Essential (primary) hypertension: Secondary | ICD-10-CM

## 2021-07-01 DIAGNOSIS — Z5181 Encounter for therapeutic drug level monitoring: Secondary | ICD-10-CM

## 2021-07-01 NOTE — Telephone Encounter (Signed)
Fax was not working.  Refaxed to [phone]. ?

## 2021-07-15 ENCOUNTER — Telehealth: Payer: Self-pay | Admitting: *Deleted

## 2021-07-15 NOTE — Telephone Encounter (Signed)
Prior Auth started for  ?Ozempic ?[id number] Key ?

## 2021-07-23 NOTE — Telephone Encounter (Signed)
More information faxed to insurance company. ?

## 2021-07-24 NOTE — Telephone Encounter (Signed)
PA is approved from 07/23/21 - 07/24/22. ?

## 2021-07-31 ENCOUNTER — Other Ambulatory Visit: Payer: Self-pay | Admitting: Internal Medicine

## 2021-07-31 ENCOUNTER — Telehealth: Payer: Self-pay | Admitting: Internal Medicine

## 2021-07-31 ENCOUNTER — Other Ambulatory Visit: Payer: Self-pay | Admitting: *Deleted

## 2021-07-31 DIAGNOSIS — E1165 Type 2 diabetes mellitus with hyperglycemia: Secondary | ICD-10-CM

## 2021-07-31 MED ORDER — OZEMPIC (0.25 OR 0.5 MG/DOSE) 2 MG/1.5ML ~~LOC~~ SOPN: PEN_INJECTOR | SUBCUTANEOUS | 2 refills | Status: DC

## 2021-07-31 MED ORDER — OZEMPIC (0.25 OR 0.5 MG/DOSE) 2 MG/1.5ML ~~LOC~~ SOPN: PEN_INJECTOR | SUBCUTANEOUS | 0 refills | Status: DC

## 2021-07-31 MED ORDER — OZEMPIC (0.25 OR 0.5 MG/DOSE) 2 MG/1.5ML ~~LOC~~ SOPN: 0.5000 mg | PEN_INJECTOR | SUBCUTANEOUS | 2 refills | Status: DC

## 2021-07-31 NOTE — Telephone Encounter (Signed)
Rx with new directions sent. 

## 2021-07-31 NOTE — Telephone Encounter (Signed)
[employer] tech is calling and needs clarification on Semaglutide,0.25 or 0.5MG /DOS, (OZEMPIC, 0.25 OR 0.5 MG/DOSE,) 2 MG/1.5ML SOPN  ?[id number] - [place] ([place]), [place] - [address] Phone:  [phone]  ?Fax:  [phone]  ?  ? ?

## 2021-08-01 ENCOUNTER — Telehealth: Payer: Self-pay | Admitting: Internal Medicine

## 2021-08-01 ENCOUNTER — Other Ambulatory Visit: Payer: Self-pay | Admitting: Internal Medicine

## 2021-08-01 MED ORDER — OZEMPIC (0.25 OR 0.5 MG/DOSE) 2 MG/1.5ML ~~LOC~~ SOPN: PEN_INJECTOR | SUBCUTANEOUS | 5 refills | Status: DC

## 2021-08-01 NOTE — Telephone Encounter (Signed)
New Rx sent.  Spoke with pharmacist. ?

## 2021-08-01 NOTE — Telephone Encounter (Signed)
Patient states the pharmacy needs a call because they said the Ozempic only come in the 0.25, and can only be increased by 0.25.   ? ?Financial planner-- [employer] ?

## 2021-08-13 DIAGNOSIS — E7800 Pure hypercholesterolemia, unspecified: Secondary | ICD-10-CM | POA: Diagnosis not present

## 2021-08-13 DIAGNOSIS — Z5181 Encounter for therapeutic drug level monitoring: Secondary | ICD-10-CM | POA: Diagnosis not present

## 2021-08-13 DIAGNOSIS — I10 Essential (primary) hypertension: Secondary | ICD-10-CM | POA: Diagnosis not present

## 2021-08-14 LAB — LIPID PANEL
Chol/HDL Ratio: 3.6 ratio (ref 0.0–5.0)
Cholesterol, Total: 300 mg/dL — ABNORMAL HIGH (ref 100–199)
HDL: 84 mg/dL (ref >39)
LDL Chol Calc (NIH): 197 mg/dL — ABNORMAL HIGH (ref 0–99)
Triglycerides: 111 mg/dL (ref 0–149)
VLDL Cholesterol Cal: 19 mg/dL (ref 5–40)

## 2021-08-14 LAB — COMPREHENSIVE METABOLIC PANEL
ALT: 11 IU/L (ref 0–44)
AST: 17 IU/L (ref 0–40)
Albumin/Globulin Ratio: 1.6 (ref 1.2–2.2)
Albumin: 4.4 g/dL (ref 3.8–4.8)
Alkaline Phosphatase: 105 IU/L (ref 44–121)
BUN/Creatinine Ratio: 10 (ref 10–24)
BUN: 13 mg/dL (ref 8–27)
Bilirubin Total: 0.8 mg/dL (ref 0.0–1.2)
CO2: 23 mmol/L (ref 20–29)
Calcium: 9.8 mg/dL (ref 8.6–10.2)
Chloride: 99 mmol/L (ref 96–106)
Creatinine, Ser: 1.32 mg/dL — ABNORMAL HIGH (ref 0.76–1.27)
Globulin, Total: 2.7 g/dL (ref 1.5–4.5)
Glucose: 164 mg/dL — ABNORMAL HIGH (ref 70–99)
Potassium: 4.7 mmol/L (ref 3.5–5.2)
Sodium: 139 mmol/L (ref 134–144)
Total Protein: 7.1 g/dL (ref 6.0–8.5)
eGFR: 61 mL/min/{1.73_m2} (ref >59)

## 2021-09-04 NOTE — Telephone Encounter (Signed)
Patient would like to follow up on the prescription and PA for the Continuous Blood Gluc Sensor (FREESTYLE LIBRE 2 SENSOR) MISC. Patient states that he has not received the device and the last time he spoke with [employer] they were still needing a PA.  Patient also dropped off paperwork that is needing to be completed. He would like a callback when completed and will pick it up in person.     Good callback number is [phone]      Please advise

## 2021-09-05 MED ORDER — FREESTYLE LIBRE 2 SENSOR MISC: 1.0000 | 3 refills | Status: DC

## 2021-09-05 NOTE — Addendum Note (Signed)
Addended by: Kern Reap B on: 09/05/2021 01:01 PM   Modules accepted: Orders

## 2021-09-09 ENCOUNTER — Telehealth: Payer: Self-pay | Admitting: *Deleted

## 2021-09-09 NOTE — Telephone Encounter (Signed)
Prior Josem Kaufmann has been started for   Continuous Blood Gluc Sensor (FREESTYLE LIBRE 2 SENSOR) MISC  Spoke with Estill Bamberg [phone]  Will be faxed with the outcome.

## 2021-09-10 NOTE — Telephone Encounter (Signed)
Prior Berkley Harvey was denied.  More information will be faxed.

## 2021-09-24 ENCOUNTER — Telehealth: Payer: Self-pay | Admitting: Internal Medicine

## 2021-09-24 MED ORDER — INSULIN PEN NEEDLE 31G X 5 MM MISC: 1.0000 | Freq: Every day | 3 refills | Status: AC

## 2021-09-26 ENCOUNTER — Encounter: Payer: Self-pay | Admitting: Internal Medicine

## 2021-09-26 ENCOUNTER — Ambulatory Visit (INDEPENDENT_AMBULATORY_CARE_PROVIDER_SITE_OTHER): Payer: Medicaid Other | Admitting: Internal Medicine

## 2021-09-26 ENCOUNTER — Telehealth: Payer: Self-pay | Admitting: *Deleted

## 2021-09-26 ENCOUNTER — Other Ambulatory Visit: Payer: Self-pay | Admitting: *Deleted

## 2021-09-26 VITALS — BP 120/84 | HR 76 | Temp 98.1°F | Wt 250.3 lb

## 2021-09-26 DIAGNOSIS — I10 Essential (primary) hypertension: Secondary | ICD-10-CM | POA: Diagnosis not present

## 2021-09-26 DIAGNOSIS — N182 Chronic kidney disease, stage 2 (mild): Secondary | ICD-10-CM

## 2021-09-26 DIAGNOSIS — E782 Mixed hyperlipidemia: Secondary | ICD-10-CM | POA: Diagnosis not present

## 2021-09-26 DIAGNOSIS — M722 Plantar fascial fibromatosis: Secondary | ICD-10-CM | POA: Diagnosis not present

## 2021-09-26 DIAGNOSIS — I700 Atherosclerosis of aorta: Secondary | ICD-10-CM | POA: Diagnosis not present

## 2021-09-26 DIAGNOSIS — Z794 Long term (current) use of insulin: Secondary | ICD-10-CM | POA: Diagnosis not present

## 2021-09-26 DIAGNOSIS — E1165 Type 2 diabetes mellitus with hyperglycemia: Secondary | ICD-10-CM | POA: Diagnosis not present

## 2021-09-26 DIAGNOSIS — U071 COVID-19: Secondary | ICD-10-CM | POA: Diagnosis not present

## 2021-09-26 DIAGNOSIS — I2699 Other pulmonary embolism without acute cor pulmonale: Secondary | ICD-10-CM

## 2021-09-26 LAB — LIPID PANEL
Cholesterol: 283 mg/dL — ABNORMAL HIGH (ref 0–200)
HDL: 75.2 mg/dL (ref >39.00)
LDL Cholesterol: 190 mg/dL — ABNORMAL HIGH (ref 0–99)
NonHDL: 207.64
Total CHOL/HDL Ratio: 4
Triglycerides: 86 mg/dL (ref 0.0–149.0)
VLDL: 17.2 mg/dL (ref 0.0–40.0)

## 2021-09-26 LAB — CBC WITH DIFFERENTIAL/PLATELET
Basophils Absolute: 0.1 10*3/uL (ref 0.0–0.1)
Basophils Relative: 1 % (ref 0.0–3.0)
Eosinophils Absolute: 0.6 10*3/uL (ref 0.0–0.7)
Eosinophils Relative: 7.2 % — ABNORMAL HIGH (ref 0.0–5.0)
HCT: 44.7 % (ref 39.0–52.0)
Hemoglobin: 14.9 g/dL (ref 13.0–17.0)
Lymphocytes Relative: 22.4 % (ref 12.0–46.0)
Lymphs Abs: 2 10*3/uL (ref 0.7–4.0)
MCHC: 33.3 g/dL (ref 30.0–36.0)
MCV: 87.9 fl (ref 78.0–100.0)
Monocytes Absolute: 0.8 10*3/uL (ref 0.1–1.0)
Monocytes Relative: 9 % (ref 3.0–12.0)
Neutro Abs: 5.3 10*3/uL (ref 1.4–7.7)
Neutrophils Relative %: 60.4 % (ref 43.0–77.0)
Platelets: 338 10*3/uL (ref 150.0–400.0)
RBC: 5.09 Mil/uL (ref 4.22–5.81)
RDW: 14.2 % (ref 11.5–15.5)
WBC: 8.8 10*3/uL (ref 4.0–10.5)

## 2021-09-26 LAB — POCT GLYCOSYLATED HEMOGLOBIN (HGB A1C): Hemoglobin A1C: 8.1 % — AB (ref 4.0–5.6)

## 2021-09-26 LAB — COMPREHENSIVE METABOLIC PANEL
ALT: 13 U/L (ref 0–53)
AST: 22 U/L (ref 0–37)
Albumin: 4.5 g/dL (ref 3.5–5.2)
Alkaline Phosphatase: 85 U/L (ref 39–117)
BUN: 15 mg/dL (ref 6–23)
CO2: 27 mEq/L (ref 19–32)
Calcium: 10.1 mg/dL (ref 8.4–10.5)
Chloride: 100 mEq/L (ref 96–112)
Creatinine, Ser: 1.32 mg/dL (ref 0.40–1.50)
GFR: 57.68 mL/min — ABNORMAL LOW (ref >60.00)
Glucose, Bld: 136 mg/dL — ABNORMAL HIGH (ref 70–99)
Potassium: 3.7 mEq/L (ref 3.5–5.1)
Sodium: 138 mEq/L (ref 135–145)
Total Bilirubin: 1.1 mg/dL (ref 0.2–1.2)
Total Protein: 7.9 g/dL (ref 6.0–8.3)

## 2021-09-26 LAB — MICROALBUMIN / CREATININE URINE RATIO
Creatinine,U: 221.8 mg/dL
Microalb Creat Ratio: 0.4 mg/g (ref 0.0–30.0)
Microalb, Ur: 0.8 mg/dL (ref 0.0–1.9)

## 2021-09-26 MED ORDER — ROSUVASTATIN CALCIUM 10 MG PO TABS: 10.0000 mg | ORAL_TABLET | Freq: Every day | ORAL | 1 refills | Status: DC

## 2021-09-26 MED ORDER — SEMAGLUTIDE (1 MG/DOSE) 4 MG/3ML ~~LOC~~ SOPN: 1.0000 mg | PEN_INJECTOR | SUBCUTANEOUS | 0 refills | Status: AC

## 2021-09-26 NOTE — Telephone Encounter (Signed)
Patient had an office visit 09/26/21. Patient states that the [facility] was approved for 6 months.

## 2021-09-26 NOTE — Progress Notes (Signed)
Established Patient Office Visit     CC/Reason for Visit: 63-month follow-up chronic medical conditions  HPI: Kevin Long is a 61 y.o. male who is coming in today for the above mentioned reasons. Past Medical History is significant for: Hypertension, hyperlipidemia, type 2 diabetes that has been uncontrolled, combined heart failure with nonischemic cardiomyopathy followed by Dr. Duke Salvia.  He had a PE after COVID infection in 2021.  He also suffers with erectile dysfunction.  He has not yet resumed his statin despite our last discussion.  He did start taking Ozempic 5 mg and is tolerating this well.  His A1c has shown improvement down to 8.1.  He is also on Farxiga 10 mg.  He is due for microalbumin.  He is complaining of some right heel pain that is especially worse when he needs to wake up in the middle of the night to use the restroom and is barefoot.   Past Medical/Surgical History: Past Medical History:  Diagnosis Date   Abnormal LFTs    Aortic atherosclerosis (HCC) 05/31/2021   Asthma    Bacteremia 09/19/2020   Chest pain of uncertain etiology 05/31/2021   Degeneration of lumbosacral intervertebral disc 09/19/2020   Diabetes mellitus, type 2 (HCC)    Essential hypertension 02/09/2007   Qualifier: Diagnosis of  By: Yetta Barre CNA/MA, Jessica     Gout 09/19/2020   HFrEF (heart failure with reduced ejection fraction) (HCC) 09/25/2016   Echocardiogram 09/22/20: EF < 20, global HK, Gr 2 DD, elevated LA pressure, D shaped LV c/w RV vol overload, mod reduced RVSF, mild BAE, trivial MR, severe TR   History of COVID-19    Admit in 07/2019 - course c/b pulmonary embolism, pneumonia, R lung mass (?infectious), MSSA bacteremia, a/c CHF, metabolic encephalopathy, AKI   Hx of Pulmonary Embolism 08/12/2019   In setting of COVID-19 pneumonia with post obstructive pneumonia (pt left hosp AMA)   Hypercholesterolemia    Mass of upper lobe of right lung 08/12/2019   during admit for COVID-19 pneumonia    Morbid obesity (HCC) 04/21/2013   NICM (nonischemic cardiomyopathy) (HCC) 08/13/2015   Normal Cath in 2014   Obesity    OSA (obstructive sleep apnea)    Pain in joint involving pelvic region and thigh 09/19/2020   Pain in joint, lower leg 09/19/2020   Palpitations 08/13/2015   PVC's (premature ventricular contractions) 03/28/2016   Noted on prior monitor but not quantified   Tobacco use disorder 09/19/2020   Vertigo 09/25/2016    Past Surgical History:  Procedure Laterality Date   FOOT SURGERY     has pin and screws   LEFT AND RIGHT HEART CATHETERIZATION WITH CORONARY ANGIOGRAM N/A 08/04/2012   Procedure: LEFT AND RIGHT HEART CATHETERIZATION WITH CORONARY ANGIOGRAM;  Surgeon: Corky Crafts, MD;  Location: [facility] CATH LAB;  Service: Cardiovascular;  Laterality: N/A;    Social History:  reports that he quit smoking about 16 years ago. His smoking use included cigarettes. He has a 25.00 pack-year smoking history. He has never used smokeless tobacco. He reports that he does not drink alcohol and does not use drugs.  Allergies: Allergies  Allergen Reactions   Celebrex [Celecoxib] Swelling    Swelling in face   Rosuvastatin Hives   Entresto [Sacubitril-Valsartan] Other (See Comments)    Dizziness    Family History:  Family History  Problem Relation Age of Onset   Diabetes Mother    Anxiety disorder Brother    Depression Son  Heart murmur Son    Heart attack Neg Hx    Hypertension Neg Hx    Stroke Neg Hx      Current Outpatient Medications:    albuterol (PROVENTIL) (2.5 MG/3ML) 0.083% nebulizer solution, Take 2.5 mg by nebulization every 6 (six) hours as needed for wheezing or shortness of breath., Disp: , Rfl:    albuterol (VENTOLIN HFA) 108 (90 Base) MCG/ACT inhaler, Inhale 2 puffs into the lungs every 6 (six) hours as needed for wheezing or shortness of breath., Disp: , Rfl:    aspirin EC 81 MG tablet, Take 81 mg by mouth daily. Swallow whole., Disp: , Rfl:     Continuous Blood Gluc Sensor (FREESTYLE LIBRE 2 SENSOR) MISC, 1 each by Does not apply route every 14 (fourteen) days., Disp: 2 each, Rfl: 3   diclofenac Sodium (VOLTAREN) 1 % GEL, Apply 1 application topically daily., Disp: , Rfl:    insulin aspart (NOVOLOG FLEXPEN) 100 UNIT/ML FlexPen, 0-20 Units, Subcutaneous, 3 times daily with meals CBG < 70: Implement Hypoglycemia measures CBG 70 - 120: 0 units CBG 121 - 150: 3 units CBG 151 - 200: 4 units CBG 201 - 250: 7 units CBG 251 - 300: 11 units CBG 301 - 350: 15 units CBG 351 - 400: 20 units CBG > 400: call MD, Disp: 15 mL, Rfl: 0   Insulin Pen Needle 31G X 5 MM MISC, 1 each by Does not apply route daily., Disp: 100 each, Rfl: 3   linaclotide (LINZESS) 145 MCG CAPS capsule, Take 145 mcg by mouth daily as needed (constipation)., Disp: , Rfl:    losartan (COZAAR) 25 MG tablet, Take 1 tablet (25 mg total) by mouth daily., Disp: 30 tablet, Rfl: 3   Multiple Vitamin (MULTIVITAMIN WITH MINERALS) TABS tablet, Take 1 tablet by mouth daily., Disp: , Rfl:    Semaglutide, 1 MG/DOSE, 4 MG/3ML SOPN, Inject 1 mg as directed once a week., Disp: 9 mL, Rfl: 0   torsemide (DEMADEX) 20 MG tablet, Take 1 tablet Monday, Wednesday, and Friday only, Disp: 40 tablet, Rfl: 3   carvedilol (COREG) 6.25 MG tablet, Take 1 tablet (6.25 mg total) by mouth 2 (two) times daily with a meal., Disp: 180 tablet, Rfl: 3  Review of Systems:  Constitutional: Denies fever, chills, diaphoresis, appetite change and fatigue.  HEENT: Denies photophobia, eye pain, redness, hearing loss, ear pain, congestion, sore throat, rhinorrhea, sneezing, mouth sores, trouble swallowing, neck pain, neck stiffness and tinnitus.   Respiratory: Denies SOB, DOE, cough, chest tightness,  and wheezing.   Cardiovascular: Denies chest pain, palpitations and leg swelling.  Gastrointestinal: Denies nausea, vomiting, abdominal pain, diarrhea, constipation, blood in stool and abdominal distention.  Genitourinary: Denies  dysuria, urgency, frequency, hematuria, flank pain and difficulty urinating.  Endocrine: Denies: hot or cold intolerance, sweats, changes in hair or nails, polyuria, polydipsia. Musculoskeletal: Denies myalgias, back pain, joint swelling, arthralgias and gait problem.  Skin: Denies pallor, rash and wound.  Neurological: Denies dizziness, seizures, syncope, weakness, light-headedness, numbness and headaches.  Hematological: Denies adenopathy. Easy bruising, personal or family bleeding history  Psychiatric/Behavioral: Denies suicidal ideation, mood changes, confusion, nervousness, sleep disturbance and agitation    Physical Exam: Vitals:   09/26/21 1000  BP: 120/84  Pulse: 76  Temp: 98.1 F (36.7 C)  TempSrc: Oral  SpO2: 96%  Weight: 250 lb 4.8 oz (113.5 kg)    Body mass index is 40.4 kg/m.   Constitutional: NAD, calm, comfortable, obese Eyes: PERRL, lids and conjunctivae normal  ENMT: Mucous membranes are moist.  Respiratory: clear to auscultation bilaterally, no wheezing, no crackles. Normal respiratory effort. No accessory muscle use.  Cardiovascular: Regular rate and rhythm, no murmurs / rubs / gallops. No extremity edema.  Psychiatric: Normal judgment and insight. Alert and oriented x 3. Normal mood.    Impression and Plan:  Uncontrolled type 2 diabetes mellitus with hyperglycemia, with long-term current use of insulin (HCC)  - Plan: POCT glycosylated hemoglobin (Hb A1C), Urine microalbumin-creatinine with uACR, CBC with Differential/Platelet, Comprehensive metabolic panel, Comprehensive metabolic panel, CBC with Differential/Platelet, Semaglutide, 1 MG/DOSE, 4 MG/3ML SOPN -A1c shows improvement from 9.7 in March down to 8.1 today, still not at goal. -Increase Ozempic from 0.5 to 1 mg.  CKD (chronic kidney disease) stage 2, GFR 60-89 ml/min -Check renal function today.  Aortic atherosclerosis (HCC) -Needs to resume statin as discussed.  Pulmonary embolism associated  with COVID-19 (HCC) -Noted, no longer on anticoagulation.  Morbid obesity (HCC) -Discussed healthy lifestyle, including increased physical activity and better food choices to promote weight loss.  Mixed hyperlipidemia  - Plan: Lipid panel, Lipid panel  Essential hypertension -Well-controlled  Plantar fasciitis of right foot -We have discussed importance of supportive footwear with good arch support and heel cup, he will do massages with tennis balls and frozen water bottles and report back.    Time spent:33 minutes reviewing chart, interviewing and examining patient and formulating plan of care.    Chaya Jan, MD [facility]

## 2021-09-26 NOTE — Telephone Encounter (Signed)
Prior Berkley Harvey has been started for  [employer] Key: [id number]

## 2021-09-30 NOTE — Telephone Encounter (Signed)
PA was denied. Office note was faxed to appeals

## 2021-10-08 ENCOUNTER — Telehealth: Payer: Self-pay | Admitting: Internal Medicine

## 2021-10-08 NOTE — Telephone Encounter (Signed)
Appointment scheduled.

## 2021-10-08 NOTE — Telephone Encounter (Signed)
Pt states he just left [facility] and was told he may need xray of his knees.  Pt would like a referral for xrays.  Please advise.  [phone]

## 2021-10-09 ENCOUNTER — Encounter: Payer: Self-pay | Admitting: Family Medicine

## 2021-10-09 ENCOUNTER — Ambulatory Visit (INDEPENDENT_AMBULATORY_CARE_PROVIDER_SITE_OTHER): Payer: Medicaid Other | Admitting: Family Medicine

## 2021-10-09 ENCOUNTER — Ambulatory Visit (INDEPENDENT_AMBULATORY_CARE_PROVIDER_SITE_OTHER): Payer: Medicaid Other

## 2021-10-09 VITALS — BP 128/84 | HR 73 | Temp 97.9°F | Wt 252.8 lb

## 2021-10-09 DIAGNOSIS — M79671 Pain in right foot: Secondary | ICD-10-CM | POA: Diagnosis not present

## 2021-10-09 DIAGNOSIS — G8929 Other chronic pain: Secondary | ICD-10-CM

## 2021-10-09 DIAGNOSIS — M7631 Iliotibial band syndrome, right leg: Secondary | ICD-10-CM

## 2021-10-09 DIAGNOSIS — M25562 Pain in left knee: Secondary | ICD-10-CM

## 2021-10-09 DIAGNOSIS — M25561 Pain in right knee: Secondary | ICD-10-CM | POA: Diagnosis not present

## 2021-10-09 DIAGNOSIS — M170 Bilateral primary osteoarthritis of knee: Secondary | ICD-10-CM

## 2021-10-09 DIAGNOSIS — Z9889 Other specified postprocedural states: Secondary | ICD-10-CM | POA: Diagnosis not present

## 2021-10-09 DIAGNOSIS — M23204 Derangement of unspecified medial meniscus due to old tear or injury, left knee: Secondary | ICD-10-CM

## 2021-10-09 DIAGNOSIS — Z981 Arthrodesis status: Secondary | ICD-10-CM | POA: Diagnosis not present

## 2021-10-09 DIAGNOSIS — S92901A Unspecified fracture of right foot, initial encounter for closed fracture: Secondary | ICD-10-CM | POA: Diagnosis not present

## 2021-10-09 NOTE — Patient Instructions (Signed)
A referral was placed for you to see the orthopedic doctors regarding your knees and heel pain.  You should expect a phone call about scheduling this appointment.

## 2021-10-09 NOTE — Progress Notes (Signed)
Subjective:    Patient ID: Kevin Long, male    DOB: 15-Aug-1960, 61 y.o.   MRN: [id number]  Chief Complaint  Patient presents with   Knee Pain    Pt c/o of both of knees pain. Been going on for years. Taking diclofenac gel for knee pain    HPI Patient was followed by Dr. Jerilee Hoh, was seen today for ongoing concern.  Patient endorses history of OA in bilateral knees.  Had MRI in 2007 at [facility] that showed left knee meniscus tear and posterior horn and body.  With a possible small displaced fragment.  Has reports from imaging with him this visit.  Patient tried to have imaging done yesterday at [facility] but was advised he needed an order/referral.  Patient does not recall seeing Ortho.  Patient also endorses right heel pain x4-6 weeks.  States heel hurts with getting out of bed but improves as the day goes on.  Patient tried ice and Voltaren gel.  Patient has a history of surgery on right foot with pin placement for fracture.  States at the time his left knee gave out and he fell on right foot.  Patient also notes history of IT band syndrome on the right.  States hip and side of thigh is tight.  May do some stretching.  Past Medical History:  Diagnosis Date   Abnormal LFTs    Aortic atherosclerosis ([place]) 05/31/2021   Asthma    Bacteremia 09/19/2020   Chest pain of uncertain etiology 0000000   Degeneration of lumbosacral intervertebral disc 09/19/2020   Diabetes mellitus, type 2 ([place])    Essential hypertension 02/09/2007   Qualifier: Diagnosis of  By: Ronnald Ramp CNA/MA, Jessica     Gout 09/19/2020   HFrEF (heart failure with reduced ejection fraction) ([place]) 09/25/2016   Echocardiogram 09/22/20: EF < 20, global HK, Gr 2 DD, elevated LA pressure, D shaped LV c/w RV vol overload, mod reduced RVSF, mild BAE, trivial MR, severe TR   History of COVID-19    Admit in 07/2019 - course c/b pulmonary embolism, pneumonia, R lung mass (?infectious), MSSA bacteremia, a/c CHF, metabolic  encephalopathy, AKI   Hx of Pulmonary Embolism 08/12/2019   In setting of COVID-19 pneumonia with post obstructive pneumonia (pt left hosp AMA)   Hypercholesterolemia    Mass of upper lobe of right lung 08/12/2019   during admit for COVID-19 pneumonia   Morbid obesity ([place]) 04/21/2013   NICM (nonischemic cardiomyopathy) ([place]) 08/13/2015   Normal Cath in 2014   Obesity    OSA (obstructive sleep apnea)    Pain in joint involving pelvic region and thigh 09/19/2020   Pain in joint, lower leg 09/19/2020   Palpitations 08/13/2015   PVC's (premature ventricular contractions) 03/28/2016   Noted on prior monitor but not quantified   Tobacco use disorder 09/19/2020   Vertigo 09/25/2016    Allergies  Allergen Reactions   Celebrex [Celecoxib] Swelling    Swelling in face   Rosuvastatin Hives   Entresto [Sacubitril-Valsartan] Other (See Comments)    Dizziness    ROS General: Denies fever, chills, night sweats, changes in weight, changes in appetite HEENT: Denies headaches, ear pain, changes in vision, rhinorrhea, sore throat CV: Denies CP, palpitations, SOB, orthopnea Pulm: Denies SOB, cough, wheezing GI: Denies abdominal pain, nausea, vomiting, diarrhea, constipation GU: Denies dysuria, hematuria, frequency Msk: Denies muscle cramps, joint pains  + bilateral knee pain, right heel pain, right lateral hip and thigh pain Neuro: Denies weakness, numbness, tingling Skin:  Denies rashes, bruising Psych: Denies depression, anxiety, hallucinations    Objective:    Blood pressure 128/84, pulse 73, temperature 97.9 F (36.6 C), temperature source Oral, weight 252 lb 12.8 oz (114.7 kg), SpO2 95 %.  Gen. Pleasant, well-nourished, in no distress, normal affect   HEENT: [place]/AT, face symmetric, conjunctiva clear, no scleral icterus, PERRLA, EOMI, nares patent without drainage Lungs: no accessory muscle use Cardiovascular: RRR, no m/r/g, no peripheral edema Musculoskeletal: Crepitus of bilateral  knees.  No effusion.  TTP of medial right heel.  No deformities, no cyanosis or clubbing, normal tone Neuro:  A&Ox3, CN II-XII intact, normal gait Skin:  Warm, no lesions/ rash.  Well-healed surgical incisions on dorsum of right foot.   Wt Readings from Last 3 Encounters:  10/09/21 252 lb 12.8 oz (114.7 kg)  09/26/21 250 lb 4.8 oz (113.5 kg)  06/26/21 247 lb 6.4 oz (112.2 kg)    Lab Results  Component Value Date   WBC 8.8 09/26/2021   HGB 14.9 09/26/2021   HCT 44.7 09/26/2021   PLT 338.0 09/26/2021   GLUCOSE 136 (H) 09/26/2021   CHOL 283 (H) 09/26/2021   TRIG 86.0 09/26/2021   HDL 75.20 09/26/2021   LDLCALC 190 (H) 09/26/2021   ALT 13 09/26/2021   AST 22 09/26/2021   NA 138 09/26/2021   K 3.7 09/26/2021   CL 100 09/26/2021   CREATININE 1.32 09/26/2021   BUN 15 09/26/2021   CO2 27 09/26/2021   TSH 2.90 04/01/2021   PSA 1.06 04/01/2021   INR 1.5 (H) 09/21/2020   HGBA1C 8.1 (A) 09/26/2021   MICROALBUR 0.8 09/26/2021    Assessment/Plan:  Chronic pain of both knees - Plan: DG Knee Complete 4 Views Left, DG Knee Complete 4 Views Right, Ambulatory referral to Orthopedic Surgery  Primary osteoarthritis of both knees - Plan: DG Knee Complete 4 Views Left, DG Knee Complete 4 Views Right, Ambulatory referral to Orthopedic Surgery  Old tear of medial meniscus of left knee, unspecified tear type - Plan: Ambulatory referral to Orthopedic Surgery  Pain of right heel - Plan: DG Foot Complete Right, Ambulatory referral to Orthopedic Surgery  Iliotibial band syndrome of right side  Discussed OA likely worsened as of been several years since last imaging.  Reports from [facility] from 2007 reviewed.   MRI lumbar spine without contrast from 10/18/2005 with left foraminal L3-4 disc extrusion impinging upon exiting left L3 root and left L3 foramen.  Additional levels of lumbar disc bulging and facet arthrosis also noted.  MRI right knee without contrast 09/17/2006 with High grade  chondromalacia of lateral patella, quadricep tendinosis, thinning and surface irregularity of the popliteus tendon suggesting tendinosis and fraying a discrete full-thickness tendon tear is not confirmed.  Scattered nonspecific fluid and edema along the IT band.  MRI left knee without contrast 10/18/2005 posterior horn and body medial meniscal tear a small displaced meniscal fragment may be present within the medial intercondylar notch.  High-grade chondromalacia of dorsal weightbearing medial femoral condyle, quadriceps and patellar tendinosis, high-grade chondromalacia of the lateral patellar facet and medial femoral trochlea.  Trace Baker's cyst accumulation.  Popliteus tendinosis, intrasubstance mucoid degeneration of the distal PCL.  We will obtain updated x-rays of bilateral knees and right heel.  Discussed right heel pain possibly 2/2 heel spurs.  Also consider plantar fasciitis.  Discussed supportive treatments including OTC pain relievers, topical analgesics, stretching, new insoles as current insoles appear worn and flattened.  Referral to Ortho placed.  Given stretching  information  F/u as needed with PCP  Abbe Amsterdam, MD

## 2021-10-16 ENCOUNTER — Ambulatory Visit: Payer: No Typology Code available for payment source | Admitting: Orthopaedic Surgery

## 2021-10-23 ENCOUNTER — Ambulatory Visit (INDEPENDENT_AMBULATORY_CARE_PROVIDER_SITE_OTHER): Payer: Medicaid Other | Admitting: Orthopaedic Surgery

## 2021-10-23 ENCOUNTER — Encounter: Payer: Self-pay | Admitting: Orthopaedic Surgery

## 2021-10-23 DIAGNOSIS — M1711 Unilateral primary osteoarthritis, right knee: Secondary | ICD-10-CM | POA: Diagnosis not present

## 2021-10-23 DIAGNOSIS — M170 Bilateral primary osteoarthritis of knee: Secondary | ICD-10-CM

## 2021-10-23 DIAGNOSIS — M79671 Pain in right foot: Secondary | ICD-10-CM | POA: Diagnosis not present

## 2021-10-23 MED ORDER — BUPIVACAINE HCL 0.25 % IJ SOLN
2.0000 mL | INTRAMUSCULAR | Status: AC | PRN
  Administered 2021-10-23: 2 mL via INTRA_ARTICULAR

## 2021-10-23 MED ORDER — LIDOCAINE HCL 1 % IJ SOLN
2.0000 mL | INTRAMUSCULAR | Status: AC | PRN
  Administered 2021-10-23: 2 mL

## 2021-10-23 MED ORDER — METHYLPREDNISOLONE ACETATE 40 MG/ML IJ SUSP
80.0000 mg | INTRAMUSCULAR | Status: AC | PRN
  Administered 2021-10-23: 80 mg via INTRA_ARTICULAR

## 2021-10-23 NOTE — Progress Notes (Signed)
Office Visit Note   Patient: Kevin Long           Date of Birth: 04-16-1960           MRN: [id number] Visit Date: 10/23/2021              Requested by: Billie Ruddy, MD [place],  [place] [id number] PCP: Isaac Bliss, Rayford Halsted, MD   Assessment & Plan: Visit Diagnoses:  1. Primary osteoarthritis of both knees   2. Pain in right foot     Plan: Kevin Long is a pleasant 61 year old gentleman with a chief complaint of right greater than left knee pain.  Also some stiffness in his right foot.  He is status post ORIF Lisfranc injury fusion in 2009 performed by Dr. Lorin Mercy.  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  Last treatment he had was a cortisone injection in 2015 he is not sure it helped very much.  He has had no previous surgery.  He is using topical Voltaren gel.  He does have braces and has some instability issues with his knees.  We recommend going forward with a cortisone injection into the more symptomatic knee today specifically the right knee.  He is a diabetic that is not very well controlled so we will delay his other injection into the knee 10 days.  We will also go forward with authorization for viscosupplementation into both knees which we can do after the cortisone injections.  Previous x-rays demonstrate almost bone-on-bone medial compartment arthritis with subchondral sclerosis encouraged him to use his braces for stability as needed This patient is diagnosed with osteoarthritis of the knee(s).    Radiographs show evidence of joint space narrowing, osteophytes, subchondral sclerosis and/or subchondral cysts.  This patient has knee pain which interferes with functional and activities of daily living.    This patient has experienced inadequate response, adverse effects and/or intolerance with conservative treatments such as acetaminophen, NSAIDS, topical creams, physical  therapy or regular exercise, knee bracing and/or weight loss.   This patient has experienced inadequate response or has a contraindication to intra articular steroid injections for at least 3 months.   This patient is not scheduled to have a total knee replacement within 6 months of starting treatment with viscosupplementation.  Orders:  Orders Placed This Encounter  Procedures   Large Joint Inj: R knee       Procedures: Large Joint Inj: R knee on 10/23/2021 1:59 PM Indications: pain and diagnostic evaluation Details: 25 G 1.5 in needle, anteromedial approach  Arthrogram: No  Medications: 80 mg methylPREDNISolone acetate 40 MG/ML; 2 mL lidocaine 1 %; 2 mL bupivacaine 0.25 % Outcome: tolerated well, no immediate complications Procedure, treatment alternatives, risks and benefits explained, specific risks discussed. Consent was given by the patient.      Clinical Data: No additional findings.   Subjective: Chief Complaint  Patient presents with   Left Knee - New Patient (Initial Visit)   Right Knee - New Patient (Initial Visit)   Right Foot - New Patient (Initial Visit)   Patient presents today as a new patient for bilateral knee pain and right foot pain. With regards to his bilateral knee pain he feels like his left knee has become worse then his right knee. Pain has been ongoing since 1980. He has tried injection, which last injection was given in 2015 which was giving  slight relief. At this time he is currently using Voltaren gel. Patient denies having any previous knee surgeries. With his right foot he states that pain was noticed around  months ago. Patient had a fall in 2009 with Dr. Ophelia Charter. He notices stiffness in the morning. Ice is being applied to the bottom of his foot.  Review of Systems  All other systems reviewed and are negative.    Objective: Vital Signs: Ht 5\' 6"  (1.676 m)   Wt 250 lb 9.6 oz (113.7 kg)   BMI 40.45 kg/m   Physical Exam Constitutional:       Appearance: Normal appearance.  Pulmonary:     Effort: Pulmonary effort is normal.  Skin:    General: Skin is warm and dry.  Neurological:     Mental Status: He is alert.     Ortho Exam Examination of his bilateral knees no effusions no redness no cellulitis.  Compartments of the lower leg are soft and nontender.  He does have tenderness more of the medial and lateral joint line with slight varus malalignment more on the right than the left.   Specialty Comments:  No specialty comments available.  Imaging: No results found.   PMFS History: Patient Active Problem List   Diagnosis Date Noted   Osteoarthritis of knees, bilateral 10/23/2021   Pain in right foot 10/23/2021   CKD (chronic kidney disease) stage 2, GFR 60-89 ml/min 06/26/2021   Chest pain of uncertain etiology 05/31/2021   Aortic atherosclerosis (HCC) 05/31/2021   Acute exacerbation of CHF (congestive heart failure) (HCC) 09/21/2020   Anasarca 09/21/2020   Pulmonary embolism associated with COVID-19 (HCC) 09/21/2020   Pain in joint involving pelvic region and thigh 09/19/2020   Low back pain 09/19/2020   Gout 09/19/2020   Degeneration of lumbosacral intervertebral disc 09/19/2020   Tobacco use disorder 09/19/2020   Pain in joint, lower leg 09/19/2020   Abnormal LFTs    Pneumonia of right upper lobe due to methicillin susceptible Staphylococcus aureus (MSSA) (HCC)    Uncontrolled type 2 diabetes mellitus with hyperglycemia, with long-term current use of insulin (HCC) 08/12/2019   Mass of upper lobe of right lung 08/12/2019   Chronic combined systolic and diastolic heart failure (HCC) 09/25/2016   Vertigo 09/25/2016   PVC's (premature ventricular contractions) 03/28/2016   Cardiomyopathy (HCC) 08/13/2015   Morbid obesity (HCC) 04/21/2013   Other primary cardiomyopathies    Type 2 diabetes mellitus without complication, without long-term current use of insulin (HCC) 02/09/2007   Hyperlipidemia 02/09/2007    OBSTRUCTIVE SLEEP APNEA 02/09/2007   Essential hypertension 02/09/2007   ASTHMA 02/09/2007   Past Medical History:  Diagnosis Date   Abnormal LFTs    Aortic atherosclerosis (HCC) 05/31/2021   Asthma    Bacteremia 09/19/2020   Chest pain of uncertain etiology 05/31/2021   Degeneration of lumbosacral intervertebral disc 09/19/2020   Diabetes mellitus, type 2 (HCC)    Essential hypertension 02/09/2007   Qualifier: Diagnosis of  By: 13/01/2007 CNA/MA, Jessica     Gout 09/19/2020   HFrEF (heart failure with reduced ejection fraction) (HCC) 09/25/2016   Echocardiogram 09/22/20: EF < 20, global HK, Gr 2 DD, elevated LA pressure, D shaped LV c/w RV vol overload, mod reduced RVSF, mild BAE, trivial MR, severe TR   History of COVID-19    Admit in 07/2019 - course c/b pulmonary embolism, pneumonia, R lung mass (?infectious), MSSA bacteremia, a/c CHF, metabolic encephalopathy, AKI   Hx of Pulmonary Embolism 08/12/2019  In setting of COVID-19 pneumonia with post obstructive pneumonia (pt left hosp AMA)   Hypercholesterolemia    Mass of upper lobe of right lung 08/12/2019   during admit for COVID-19 pneumonia   Morbid obesity (HCC) 04/21/2013   NICM (nonischemic cardiomyopathy) (HCC) 08/13/2015   Normal Cath in 2014   Obesity    OSA (obstructive sleep apnea)    Pain in joint involving pelvic region and thigh 09/19/2020   Pain in joint, lower leg 09/19/2020   Palpitations 08/13/2015   PVC's (premature ventricular contractions) 03/28/2016   Noted on prior monitor but not quantified   Tobacco use disorder 09/19/2020   Vertigo 09/25/2016    Family History  Problem Relation Age of Onset   Diabetes Mother    Anxiety disorder Brother    Depression Son    Heart murmur Son    Heart attack Neg Hx    Hypertension Neg Hx    Stroke Neg Hx     Past Surgical History:  Procedure Laterality Date   FOOT SURGERY     has pin and screws   LEFT AND RIGHT HEART CATHETERIZATION WITH CORONARY ANGIOGRAM N/A  08/04/2012   Procedure: LEFT AND RIGHT HEART CATHETERIZATION WITH CORONARY ANGIOGRAM;  Surgeon: Corky Crafts, MD;  Location: [facility] CATH LAB;  Service: Cardiovascular;  Laterality: N/A;   Social History   Occupational History   Not on file  Tobacco Use   Smoking status: Former    Packs/day: 1.00    Years: 25.00    Total pack years: 25.00    Types: Cigarettes    Quit date: 03/31/2005    Years since quitting: 16.5   Smokeless tobacco: Never  Vaping Use   Vaping Use: Never used  Substance and Sexual Activity   Alcohol use: No   Drug use: No   Sexual activity: Not on file

## 2021-11-04 ENCOUNTER — Ambulatory Visit: Payer: No Typology Code available for payment source | Admitting: Physician Assistant

## 2021-11-07 ENCOUNTER — Telehealth: Payer: Self-pay

## 2021-11-07 NOTE — Telephone Encounter (Signed)
VOB submitted for Orthovisc, bilateral knee 

## 2021-11-07 NOTE — Telephone Encounter (Signed)
-----   Message from Roxan Hockey, CMA sent at 10/23/2021  1:49 PM EDT ----- Regarding: GEL INJECTIONS Dr. Cleophas Dunker would like to get patient approved for gel injections in his left and right knee !

## 2021-11-26 ENCOUNTER — Encounter: Payer: Self-pay | Admitting: Internal Medicine

## 2021-11-26 ENCOUNTER — Ambulatory Visit (INDEPENDENT_AMBULATORY_CARE_PROVIDER_SITE_OTHER): Payer: Medicaid Other | Admitting: Internal Medicine

## 2021-11-26 VITALS — BP 130/80 | HR 70 | Temp 97.1°F | Wt 250.1 lb

## 2021-11-26 DIAGNOSIS — M170 Bilateral primary osteoarthritis of knee: Secondary | ICD-10-CM | POA: Diagnosis not present

## 2021-11-26 DIAGNOSIS — Z87828 Personal history of other (healed) physical injury and trauma: Secondary | ICD-10-CM | POA: Diagnosis not present

## 2021-11-26 DIAGNOSIS — E119 Type 2 diabetes mellitus without complications: Secondary | ICD-10-CM

## 2021-11-26 NOTE — Progress Notes (Signed)
Established Patient Office Visit     CC/Reason for Visit: Discuss VA disability  HPI: Kevin Long is a 61 y.o. male who is coming in today for the above mentioned reasons.  He is here today to discuss VA compensation for his knee.  He is requesting that I write a letter in support of this.  He brings in all previous orthopedic office visit notes and MRI reports.  Past Medical/Surgical History: Past Medical History:  Diagnosis Date   Abnormal LFTs    Aortic atherosclerosis (HCC) 05/31/2021   Asthma    Bacteremia 09/19/2020   Chest pain of uncertain etiology 05/31/2021   Degeneration of lumbosacral intervertebral disc 09/19/2020   Diabetes mellitus, type 2 (HCC)    Essential hypertension 02/09/2007   Qualifier: Diagnosis of  By: Yetta Barre CNA/MA, Jessica     Gout 09/19/2020   HFrEF (heart failure with reduced ejection fraction) (HCC) 09/25/2016   Echocardiogram 09/22/20: EF < 20, global HK, Gr 2 DD, elevated LA pressure, D shaped LV c/w RV vol overload, mod reduced RVSF, mild BAE, trivial MR, severe TR   History of COVID-19    Admit in 07/2019 - course c/b pulmonary embolism, pneumonia, R lung mass (?infectious), MSSA bacteremia, a/c CHF, metabolic encephalopathy, AKI   Hx of Pulmonary Embolism 08/12/2019   In setting of COVID-19 pneumonia with post obstructive pneumonia (pt left hosp AMA)   Hypercholesterolemia    Mass of upper lobe of right lung 08/12/2019   during admit for COVID-19 pneumonia   Morbid obesity (HCC) 04/21/2013   NICM (nonischemic cardiomyopathy) (HCC) 08/13/2015   Normal Cath in 2014   Obesity    OSA (obstructive sleep apnea)    Pain in joint involving pelvic region and thigh 09/19/2020   Pain in joint, lower leg 09/19/2020   Palpitations 08/13/2015   PVC's (premature ventricular contractions) 03/28/2016   Noted on prior monitor but not quantified   Tobacco use disorder 09/19/2020   Vertigo 09/25/2016    Past Surgical History:  Procedure Laterality Date    FOOT SURGERY     has pin and screws   LEFT AND RIGHT HEART CATHETERIZATION WITH CORONARY ANGIOGRAM N/A 08/04/2012   Procedure: LEFT AND RIGHT HEART CATHETERIZATION WITH CORONARY ANGIOGRAM;  Surgeon: Corky Crafts, MD;  Location: [facility] CATH LAB;  Service: Cardiovascular;  Laterality: N/A;    Social History:  reports that he quit smoking about 16 years ago. His smoking use included cigarettes. He has a 25.00 pack-year smoking history. He has never used smokeless tobacco. He reports that he does not drink alcohol and does not use drugs.  Allergies: Allergies  Allergen Reactions   Celebrex [Celecoxib] Swelling    Swelling in face   Rosuvastatin Hives   Entresto [Sacubitril-Valsartan] Other (See Comments)    Dizziness    Family History:  Family History  Problem Relation Age of Onset   Diabetes Mother    Anxiety disorder Brother    Depression Son    Heart murmur Son    Heart attack Neg Hx    Hypertension Neg Hx    Stroke Neg Hx      Current Outpatient Medications:    albuterol (PROVENTIL) (2.5 MG/3ML) 0.083% nebulizer solution, Take 2.5 mg by nebulization every 6 (six) hours as needed for wheezing or shortness of breath., Disp: , Rfl:    albuterol (VENTOLIN HFA) 108 (90 Base) MCG/ACT inhaler, Inhale 2 puffs into the lungs every 6 (six) hours as needed for wheezing or shortness  of breath., Disp: , Rfl:    aspirin EC 81 MG tablet, Take 81 mg by mouth daily. Swallow whole., Disp: , Rfl:    Continuous Blood Gluc Sensor (FREESTYLE LIBRE 2 SENSOR) MISC, 1 each by Does not apply route every 14 (fourteen) days., Disp: 2 each, Rfl: 3   diclofenac Sodium (VOLTAREN) 1 % GEL, Apply 1 application topically daily., Disp: , Rfl:    insulin aspart (NOVOLOG FLEXPEN) 100 UNIT/ML FlexPen, 0-20 Units, Subcutaneous, 3 times daily with meals CBG < 70: Implement Hypoglycemia measures CBG 70 - 120: 0 units CBG 121 - 150: 3 units CBG 151 - 200: 4 units CBG 201 - 250: 7 units CBG 251 - 300: 11 units CBG  301 - 350: 15 units CBG 351 - 400: 20 units CBG > 400: call MD, Disp: 15 mL, Rfl: 0   Insulin Pen Needle 31G X 5 MM MISC, 1 each by Does not apply route daily., Disp: 100 each, Rfl: 3   linaclotide (LINZESS) 145 MCG CAPS capsule, Take 145 mcg by mouth daily as needed (constipation)., Disp: , Rfl:    losartan (COZAAR) 25 MG tablet, Take 1 tablet (25 mg total) by mouth daily., Disp: 30 tablet, Rfl: 3   Multiple Vitamin (MULTIVITAMIN WITH MINERALS) TABS tablet, Take 1 tablet by mouth daily., Disp: , Rfl:    Semaglutide, 1 MG/DOSE, 4 MG/3ML SOPN, Inject 1 mg as directed once a week., Disp: 9 mL, Rfl: 0   torsemide (DEMADEX) 20 MG tablet, Take 1 tablet Monday, Wednesday, and Friday only, Disp: 40 tablet, Rfl: 3   carvedilol (COREG) 6.25 MG tablet, Take 1 tablet (6.25 mg total) by mouth 2 (two) times daily with a meal., Disp: 180 tablet, Rfl: 3  Review of Systems:  Constitutional: Denies fever, chills, diaphoresis, appetite change and fatigue.  HEENT: Denies photophobia, eye pain, redness, hearing loss, ear pain, congestion, sore throat, rhinorrhea, sneezing, mouth sores, trouble swallowing, neck pain, neck stiffness and tinnitus.   Respiratory: Denies SOB, DOE, cough, chest tightness,  and wheezing.   Cardiovascular: Denies chest pain, palpitations and leg swelling.  Gastrointestinal: Denies nausea, vomiting, abdominal pain, diarrhea, constipation, blood in stool and abdominal distention.  Genitourinary: Denies dysuria, urgency, frequency, hematuria, flank pain and difficulty urinating.  Endocrine: Denies: hot or cold intolerance, sweats, changes in hair or nails, polyuria, polydipsia. Musculoskeletal: Positive for myalgias, back pain, joint swelling, arthralgias and gait problem.  Skin: Denies pallor, rash and wound.  Neurological: Denies dizziness, seizures, syncope, weakness, light-headedness, numbness and headaches.  Hematological: Denies adenopathy. Easy bruising, personal or family bleeding  history  Psychiatric/Behavioral: Denies suicidal ideation, mood changes, confusion, nervousness, sleep disturbance and agitation    Physical Exam: Vitals:   11/26/21 1435  BP: 130/80  Pulse: 70  Temp: (!) 97.1 F (36.2 C)  TempSrc: Oral  SpO2: 98%  Weight: 250 lb 1.6 oz (113.4 kg)    Body mass index is 40.37 kg/m.   Constitutional: NAD, calm, comfortable Eyes: PERRL, lids and conjunctivae normal ENMT: Mucous membranes are moist. .  Psychiatric: Normal judgment and insight. Alert and oriented x 3. Normal mood.    Impression and Plan:  Type 2 diabetes mellitus without complication, without long-term current use of insulin (HCC) - Plan: Ambulatory referral to Ophthalmology  Primary osteoarthritis of both knees  History of meniscal tear  -Patient and I have spent a significant amount of time today going over his previous records both from orthopedics and MRIs.  We have gone over requirements for [facility]  documentation for him to receive this compensation.  I believe he is eligible based on information provided to me.  Letter will be drafted in support of this.   Time spent:33 minutes reviewing chart, interviewing and examining patient and formulating plan of care.    Chaya Jan, MD [facility]

## 2021-12-26 ENCOUNTER — Ambulatory Visit: Payer: Medicaid Other | Admitting: Internal Medicine

## 2021-12-26 ENCOUNTER — Encounter: Payer: Self-pay | Admitting: Internal Medicine

## 2021-12-26 VITALS — BP 127/78 | HR 77 | Temp 98.2°F | Resp 18 | Ht 66.0 in | Wt 244.1 lb

## 2021-12-26 DIAGNOSIS — N182 Chronic kidney disease, stage 2 (mild): Secondary | ICD-10-CM

## 2021-12-26 DIAGNOSIS — I10 Essential (primary) hypertension: Secondary | ICD-10-CM

## 2021-12-26 DIAGNOSIS — E782 Mixed hyperlipidemia: Secondary | ICD-10-CM | POA: Diagnosis not present

## 2021-12-26 DIAGNOSIS — I5042 Chronic combined systolic (congestive) and diastolic (congestive) heart failure: Secondary | ICD-10-CM

## 2021-12-26 DIAGNOSIS — I700 Atherosclerosis of aorta: Secondary | ICD-10-CM | POA: Diagnosis not present

## 2021-12-26 DIAGNOSIS — E1169 Type 2 diabetes mellitus with other specified complication: Secondary | ICD-10-CM | POA: Diagnosis not present

## 2021-12-26 LAB — POCT GLYCOSYLATED HEMOGLOBIN (HGB A1C): Hemoglobin A1C: 7.5 % — AB (ref 4.0–5.6)

## 2021-12-26 MED ORDER — SEMAGLUTIDE (1 MG/DOSE) 4 MG/3ML ~~LOC~~ SOPN: 1.0000 mg | PEN_INJECTOR | SUBCUTANEOUS | 1 refills | Status: DC

## 2021-12-26 NOTE — Progress Notes (Signed)
Established Patient Office Visit     CC/Reason for Visit: 66-month follow-up chronic medical conditions  HPI: Kevin Long is a 61 y.o. male who is coming in today for the above mentioned reasons. Past Medical History is significant for:-Hypertension, hyperlipidemia, morbid obesity, type 2 diabetes, erectile dysfunction, chronic combined heart failure.  He is doing well today.  He has lost 6 pounds.  He is injecting semaglutide weekly.  He tried statin prescribed at last visit but did not tolerate it.   Past Medical/Surgical History: Past Medical History:  Diagnosis Date   Abnormal LFTs    Aortic atherosclerosis ([place]) 05/31/2021   Asthma    Bacteremia 09/19/2020   Chest pain of uncertain etiology 0000000   Degeneration of lumbosacral intervertebral disc 09/19/2020   Diabetes mellitus, type 2 ([place])    Essential hypertension 02/09/2007   Qualifier: Diagnosis of  By: Ronnald Ramp CNA/MA, Jessica     Gout 09/19/2020   HFrEF (heart failure with reduced ejection fraction) ([place]) 09/25/2016   Echocardiogram 09/22/20: EF < 20, global HK, Gr 2 DD, elevated LA pressure, D shaped LV c/w RV vol overload, mod reduced RVSF, mild BAE, trivial MR, severe TR   History of COVID-19    Admit in 07/2019 - course c/b pulmonary embolism, pneumonia, R lung mass (?infectious), MSSA bacteremia, a/c CHF, metabolic encephalopathy, AKI   Hx of Pulmonary Embolism 08/12/2019   In setting of COVID-19 pneumonia with post obstructive pneumonia (pt left hosp AMA)   Hypercholesterolemia    Mass of upper lobe of right lung 08/12/2019   during admit for COVID-19 pneumonia   Morbid obesity ([place]) 04/21/2013   NICM (nonischemic cardiomyopathy) ([place]) 08/13/2015   Normal Cath in 2014   Obesity    OSA (obstructive sleep apnea)    Pain in joint involving pelvic region and thigh 09/19/2020   Pain in joint, lower leg 09/19/2020   Palpitations 08/13/2015   PVC's (premature ventricular contractions) 03/28/2016   Noted on prior  monitor but not quantified   Tobacco use disorder 09/19/2020   Vertigo 09/25/2016    Past Surgical History:  Procedure Laterality Date   FOOT SURGERY     has pin and screws   LEFT AND RIGHT HEART CATHETERIZATION WITH CORONARY ANGIOGRAM N/A 08/04/2012   Procedure: LEFT AND RIGHT HEART CATHETERIZATION WITH CORONARY ANGIOGRAM;  Surgeon: Jettie Booze, MD;  Location: [facility] CATH LAB;  Service: Cardiovascular;  Laterality: N/A;    Social History:  reports that he quit smoking about 16 years ago. His smoking use included cigarettes. He has a 25.00 pack-year smoking history. He has never used smokeless tobacco. He reports that he does not drink alcohol and does not use drugs.  Allergies: Allergies  Allergen Reactions   Celebrex [Celecoxib] Swelling    Swelling in face   Rosuvastatin Hives   Entresto [Sacubitril-Valsartan] Other (See Comments)    Dizziness    Family History:  Family History  Problem Relation Age of Onset   Diabetes Mother    Anxiety disorder Brother    Depression Son    Heart murmur Son    Heart attack Neg Hx    Hypertension Neg Hx    Stroke Neg Hx      Current Outpatient Medications:    albuterol (PROVENTIL) (2.5 MG/3ML) 0.083% nebulizer solution, Take 2.5 mg by nebulization every 6 (six) hours as needed for wheezing or shortness of breath., Disp: , Rfl:    albuterol (VENTOLIN HFA) 108 (90 Base) MCG/ACT inhaler, Inhale 2  puffs into the lungs every 6 (six) hours as needed for wheezing or shortness of breath., Disp: , Rfl:    aspirin EC 81 MG tablet, Take 81 mg by mouth daily. Swallow whole., Disp: , Rfl:    Continuous Blood Gluc Sensor (FREESTYLE LIBRE 2 SENSOR) MISC, 1 each by Does not apply route every 14 (fourteen) days., Disp: 2 each, Rfl: 3   diclofenac Sodium (VOLTAREN) 1 % GEL, Apply 1 application topically daily., Disp: , Rfl:    insulin aspart (NOVOLOG FLEXPEN) 100 UNIT/ML FlexPen, 0-20 Units, Subcutaneous, 3 times daily with meals CBG < 70: Implement  Hypoglycemia measures CBG 70 - 120: 0 units CBG 121 - 150: 3 units CBG 151 - 200: 4 units CBG 201 - 250: 7 units CBG 251 - 300: 11 units CBG 301 - 350: 15 units CBG 351 - 400: 20 units CBG > 400: call MD, Disp: 15 mL, Rfl: 0   Insulin Pen Needle 31G X 5 MM MISC, 1 each by Does not apply route daily., Disp: 100 each, Rfl: 3   linaclotide (LINZESS) 145 MCG CAPS capsule, Take 145 mcg by mouth daily as needed (constipation)., Disp: , Rfl:    losartan (COZAAR) 25 MG tablet, Take 1 tablet (25 mg total) by mouth daily., Disp: 30 tablet, Rfl: 3   Multiple Vitamin (MULTIVITAMIN WITH MINERALS) TABS tablet, Take 1 tablet by mouth daily., Disp: , Rfl:    Semaglutide, 1 MG/DOSE, 4 MG/3ML SOPN, Inject 1 mg as directed once a week., Disp: 3 mL, Rfl: 1   torsemide (DEMADEX) 20 MG tablet, Take 1 tablet Monday, Wednesday, and Friday only, Disp: 40 tablet, Rfl: 3   carvedilol (COREG) 6.25 MG tablet, Take 1 tablet (6.25 mg total) by mouth 2 (two) times daily with a meal., Disp: 180 tablet, Rfl: 3  Review of Systems:  Constitutional: Denies fever, chills, diaphoresis, appetite change and fatigue.  HEENT: Denies photophobia, eye pain, redness, hearing loss, ear pain, congestion, sore throat, rhinorrhea, sneezing, mouth sores, trouble swallowing, neck pain, neck stiffness and tinnitus.   Respiratory: Denies SOB, DOE, cough, chest tightness,  and wheezing.   Cardiovascular: Denies chest pain, palpitations and leg swelling.  Gastrointestinal: Denies nausea, vomiting, abdominal pain, diarrhea, constipation, blood in stool and abdominal distention.  Genitourinary: Denies dysuria, urgency, frequency, hematuria, flank pain and difficulty urinating.  Endocrine: Denies: hot or cold intolerance, sweats, changes in hair or nails, polyuria, polydipsia. Musculoskeletal: Denies myalgias, back pain, joint swelling. Skin: Denies pallor, rash and wound.  Neurological: Denies dizziness, seizures, syncope, weakness, light-headedness,  numbness and headaches.  Hematological: Denies adenopathy. Easy bruising, personal or family bleeding history  Psychiatric/Behavioral: Denies suicidal ideation, mood changes, confusion, nervousness, sleep disturbance and agitation    Physical Exam: Vitals:   12/26/21 1101  BP: 127/78  Pulse: 77  Resp: 18  Temp: 98.2 F (36.8 C)  TempSrc: Oral  SpO2: 98%  Weight: 244 lb 1.6 oz (110.7 kg)  Height: 5\' 6"  (1.676 m)    Body mass index is 39.4 kg/m.   Constitutional: NAD, calm, comfortable, obese Eyes: PERRL, lids and conjunctivae normal ENMT: Mucous membranes are moist.  Respiratory: clear to auscultation bilaterally, no wheezing, no crackles. Normal respiratory effort. No accessory muscle use.  Cardiovascular: Regular rate and rhythm, no murmurs / rubs / gallops. No extremity edema. Psychiatric: Normal judgment and insight. Alert and oriented x 3. Normal mood.    Impression and Plan:  Type 2 diabetes mellitus with other specified complication, without long-term current use of  insulin ([place]) - Plan: POCT glycosylated hemoglobin (Hb A1C), Semaglutide, 1 MG/DOSE, 4 MG/3ML SOPN  Mixed hyperlipidemia - Plan: AMB Referral to [facility]  Essential hypertension  Chronic combined systolic and diastolic heart failure (HCC)  Aortic atherosclerosis (HCC)  CKD (chronic kidney disease) stage 2, GFR 60-89 ml/min  -Diabetes continues to show signs of improvement with an A1c down to 7.5 today, he has also lost 6 pounds since last visit. -Continue semaglutide 1 mg weekly, refill sent. -I am concerned about his hyperlipidemia, he did not tolerate statin because of dizziness.  I will place referral to lipid clinic, should probably be on a PCSK9 inhibitor.  Last lipid panel with total cholesterol 283, triglycerides 86 and LDL 190. -Blood pressure is well controlled.  Time spent:32 minutes reviewing chart, interviewing and examining patient and formulating plan of  care.     Lelon Frohlich, MD [facility] at [facility]

## 2022-01-07 ENCOUNTER — Encounter: Payer: Self-pay | Admitting: Internal Medicine

## 2022-01-07 MED ORDER — LINACLOTIDE 145 MCG PO CAPS: 145.0000 ug | ORAL_CAPSULE | Freq: Every day | ORAL | 1 refills | Status: DC | PRN

## 2022-02-04 ENCOUNTER — Ambulatory Visit: Payer: Medicaid Other

## 2022-02-15 IMAGING — DX DG FOOT COMPLETE 3+V*L*
3 series · 3 of 3 positions shown · non-contrast
Comparison: None.

CLINICAL DATA: Left foot pain, bacteremia

EXAM:
LEFT FOOT - COMPLETE 3+ VIEW

[foot ap]
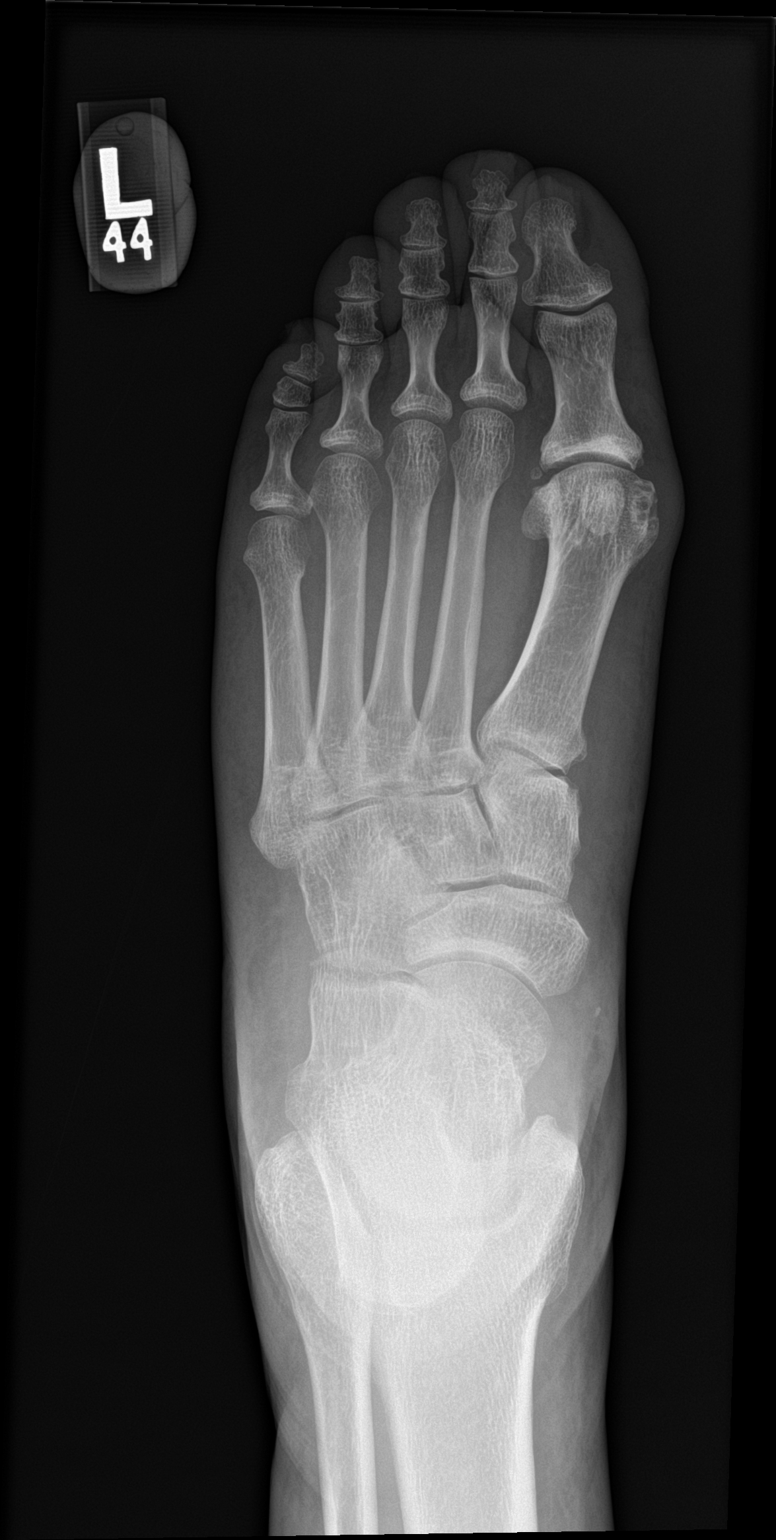

[foot obl]
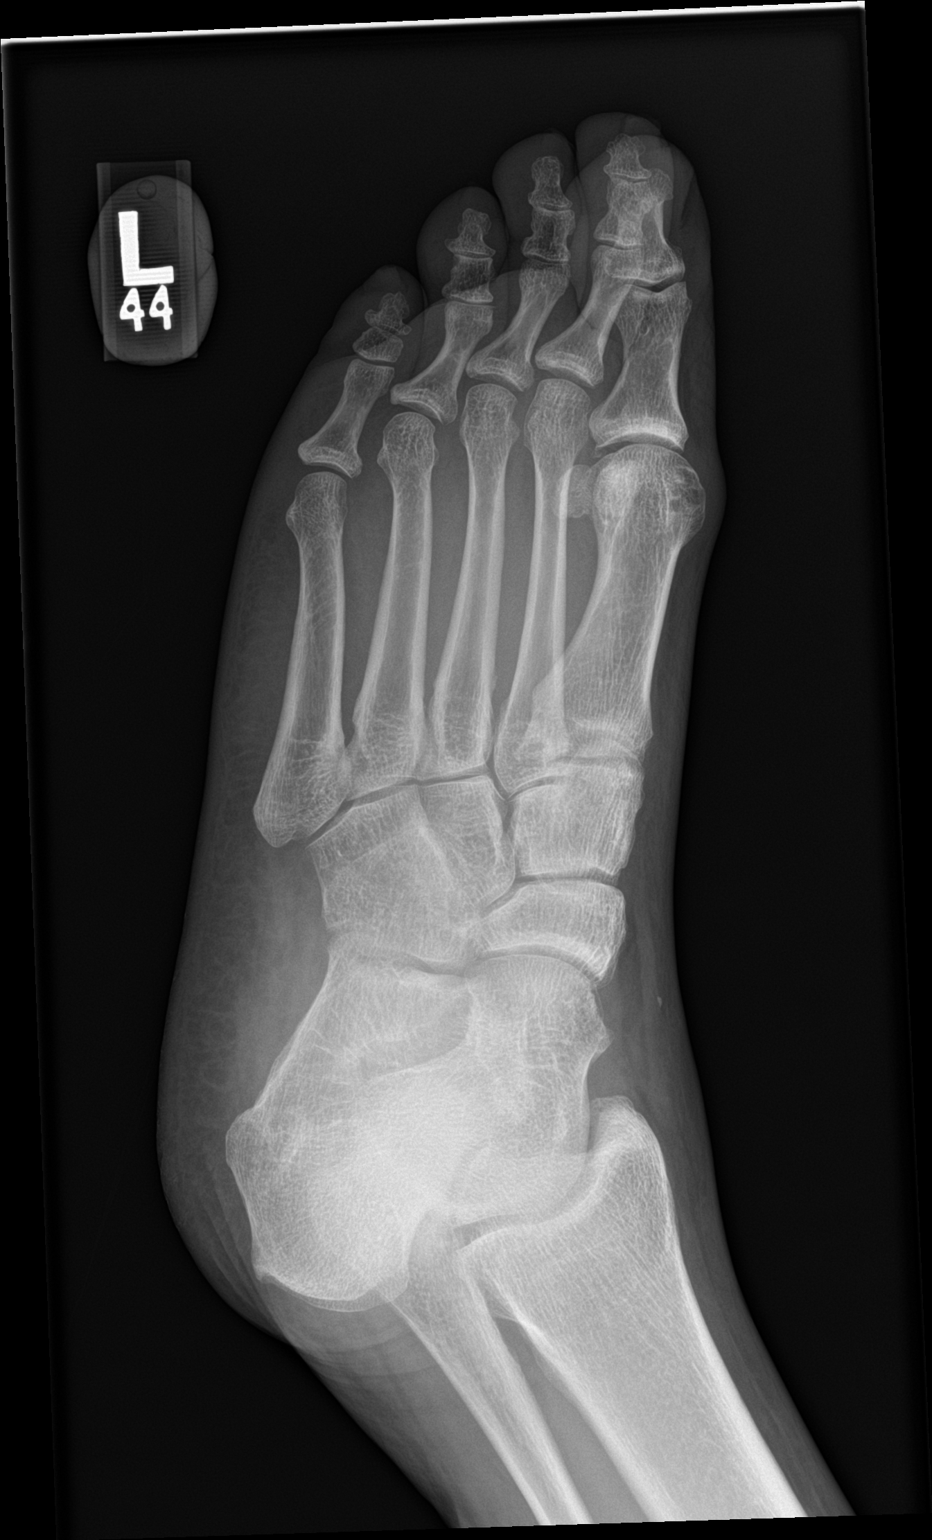

[foot lat]
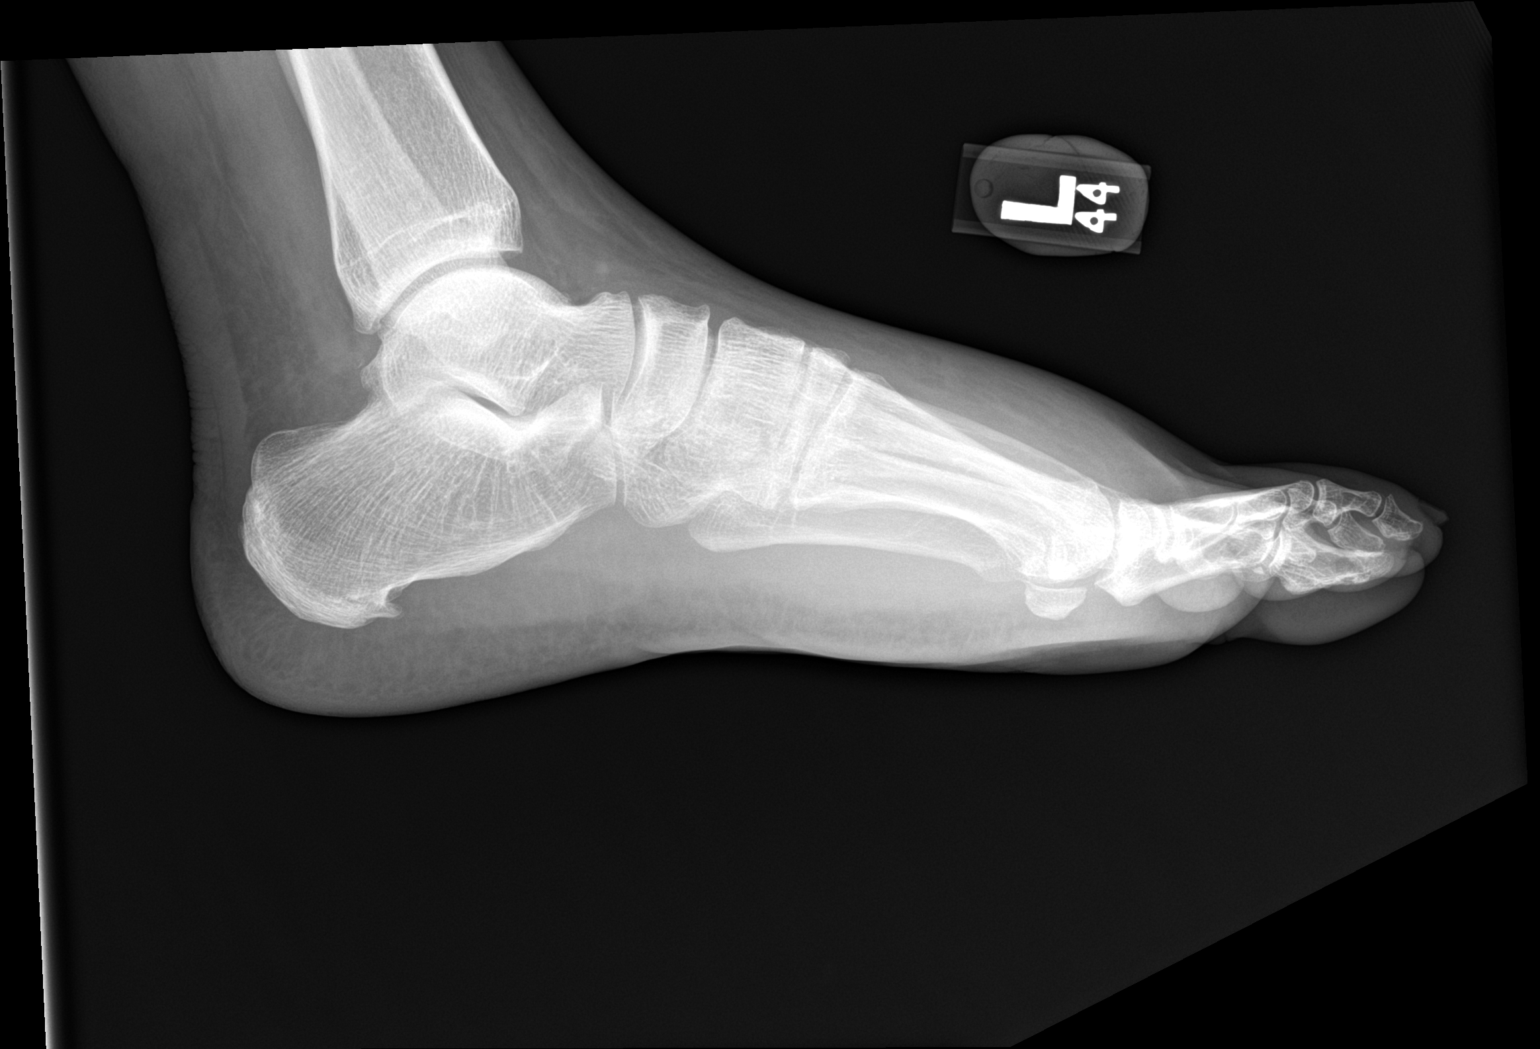

[3 of 3 positions shown; findings below may reference images not displayed]

FINDINGS: No acute fracture. No dislocation. Mild first MTP joint space
narrowing. Well defined marginal lucencies with sclerotic margins at
the medial aspect of the left first metatarsal head, which can be
seen in the setting of a crystalline arthropathy such as gout. Small
plantar calcaneal spur. There is soft tissue swelling over the
dorsum of the forefoot.
IMPRESSION: 1. Soft tissue swelling over the dorsum of the forefoot without
underlying fracture or dislocation.
2. Small well defined marginal lucencies at the medial aspect of the
left first metatarsal head, which may reflect sequela of gout.

## 2022-02-26 ENCOUNTER — Other Ambulatory Visit (HOSPITAL_COMMUNITY): Payer: Self-pay | Admitting: Cardiology

## 2022-03-03 ENCOUNTER — Other Ambulatory Visit (HOSPITAL_COMMUNITY): Payer: Self-pay | Admitting: Cardiology

## 2022-03-03 ENCOUNTER — Other Ambulatory Visit: Payer: Self-pay | Admitting: Internal Medicine

## 2022-03-04 MED ORDER — ALBUTEROL SULFATE HFA 108 (90 BASE) MCG/ACT IN AERS: 2.0000 | INHALATION_SPRAY | Freq: Four times a day (QID) | RESPIRATORY_TRACT | 2 refills | Status: DC | PRN

## 2022-03-04 MED ORDER — DICLOFENAC SODIUM 1 % EX GEL: 2.0000 g | Freq: Every day | CUTANEOUS | 2 refills | Status: DC

## 2022-03-12 ENCOUNTER — Telehealth: Payer: Self-pay | Admitting: Internal Medicine

## 2022-03-12 ENCOUNTER — Telehealth: Payer: Self-pay | Admitting: Cardiovascular Disease

## 2022-03-12 MED ORDER — TORSEMIDE 20 MG PO TABS: ORAL_TABLET | ORAL | 3 refills | Status: DC

## 2022-03-12 MED ORDER — FREESTYLE LIBRE 2 SENSOR MISC: 1.0000 | 3 refills | Status: DC

## 2022-03-12 NOTE — Telephone Encounter (Signed)
Confirmed with pharmacy.

## 2022-03-12 NOTE — Telephone Encounter (Signed)
Pt is calling and would like refill on Continuous Blood Gluc Sensor (FREESTYLE LIBRE 2 SENSOR) MISC  [facility] - [place] ([place]), [place] - [address] Phone: [phone]  Fax: [phone]

## 2022-03-12 NOTE — Telephone Encounter (Signed)
Rx request sent to pharmacy.  

## 2022-03-12 NOTE — Telephone Encounter (Signed)
Calling to verify if the patient should receive 2 sensors Continuous Blood Gluc Sensor (FREESTYLE LIBRE 2 SENSOR) MISC

## 2022-03-12 NOTE — Telephone Encounter (Signed)
*  STAT* If patient is at the pharmacy, call can be transferred to refill team.   1. Which medications need to be refilled? (please list name of each medication and dose if known)  torsemide (DEMADEX) 20 MG tablet   2. Which pharmacy/location (including street and city if local pharmacy) is medication to be sent to? [facility] - [place] ([place] - [address]  3. Do they need a 30 day or 90 day supply?  90 day supply

## 2022-03-17 ENCOUNTER — Other Ambulatory Visit (HOSPITAL_COMMUNITY): Payer: Self-pay

## 2022-03-17 ENCOUNTER — Telehealth: Payer: Self-pay

## 2022-03-17 NOTE — Telephone Encounter (Addendum)
Patient Advocate Encounter  Prior Authorization for [employer] 2 Reader Device has been approved through [employer].    Effective dates: 03/17/22 through 03/18/23 1 device for 365 days

## 2022-03-17 NOTE — Telephone Encounter (Signed)
Patient Advocate Encounter  Prior Authorization for [employer] 2 sensors has been approved through [employer].     Effective dates: 03/17/22 through 03/18/23 Quantity of 2 for 28 days

## 2022-03-17 NOTE — Telephone Encounter (Deleted)
Error

## 2022-04-02 ENCOUNTER — Ambulatory Visit: Payer: Medicaid Other | Admitting: Internal Medicine

## 2022-04-02 ENCOUNTER — Telehealth: Payer: Self-pay | Admitting: Internal Medicine

## 2022-04-02 ENCOUNTER — Encounter: Payer: Self-pay | Admitting: Internal Medicine

## 2022-04-02 VITALS — BP 120/78 | HR 70 | Temp 98.0°F | Wt 243.1 lb

## 2022-04-02 DIAGNOSIS — E782 Mixed hyperlipidemia: Secondary | ICD-10-CM | POA: Diagnosis not present

## 2022-04-02 DIAGNOSIS — N183 Chronic kidney disease, stage 3 unspecified: Secondary | ICD-10-CM

## 2022-04-02 DIAGNOSIS — E1122 Type 2 diabetes mellitus with diabetic chronic kidney disease: Secondary | ICD-10-CM | POA: Diagnosis not present

## 2022-04-02 DIAGNOSIS — I5042 Chronic combined systolic (congestive) and diastolic (congestive) heart failure: Secondary | ICD-10-CM | POA: Diagnosis not present

## 2022-04-02 DIAGNOSIS — I10 Essential (primary) hypertension: Secondary | ICD-10-CM | POA: Diagnosis not present

## 2022-04-02 DIAGNOSIS — E119 Type 2 diabetes mellitus without complications: Secondary | ICD-10-CM

## 2022-04-02 LAB — POCT GLYCOSYLATED HEMOGLOBIN (HGB A1C): Hemoglobin A1C: 7.1 % — AB (ref 4.0–5.6)

## 2022-04-02 NOTE — Progress Notes (Signed)
Established Patient Office Visit     CC/Reason for Visit: Follow-up chronic conditions, requesting form  HPI: Kevin Long is a 62 y.o. male who is coming in today for the above mentioned reasons. Past Medical History is significant for: Hypertension, hyperlipidemia, type 2 diabetes, morbid obesity, chronic combined CHF.  He has a scheduled appointment to see his cardiologist in February.  He would like me to fill out a permit to allow him to tint his windows that the [facility] has sent him.  He states he needs the tint because the glare of oncoming lights really bother him now when he drives.  He is otherwise feeling well.  He canceled the appointment that we had made with him at the lipid clinic.   Past Medical/Surgical History: Past Medical History:  Diagnosis Date   Abnormal LFTs    Aortic atherosclerosis (HCC) 05/31/2021   Asthma    Bacteremia 09/19/2020   Chest pain of uncertain etiology 05/31/2021   Degeneration of lumbosacral intervertebral disc 09/19/2020   Diabetes mellitus, type 2 (HCC)    Essential hypertension 02/09/2007   Qualifier: Diagnosis of  By: Yetta Barre CNA/MA, Jessica     Gout 09/19/2020   HFrEF (heart failure with reduced ejection fraction) (HCC) 09/25/2016   Echocardiogram 09/22/20: EF < 20, global HK, Gr 2 DD, elevated LA pressure, D shaped LV c/w RV vol overload, mod reduced RVSF, mild BAE, trivial MR, severe TR   History of COVID-19    Admit in 07/2019 - course c/b pulmonary embolism, pneumonia, R lung mass (?infectious), MSSA bacteremia, a/c CHF, metabolic encephalopathy, AKI   Hx of Pulmonary Embolism 08/12/2019   In setting of COVID-19 pneumonia with post obstructive pneumonia (pt left hosp AMA)   Hypercholesterolemia    Mass of upper lobe of right lung 08/12/2019   during admit for COVID-19 pneumonia   Morbid obesity (HCC) 04/21/2013   NICM (nonischemic cardiomyopathy) (HCC) 08/13/2015   Normal Cath in 2014   Obesity    OSA (obstructive sleep apnea)    Pain  in joint involving pelvic region and thigh 09/19/2020   Pain in joint, lower leg 09/19/2020   Palpitations 08/13/2015   PVC's (premature ventricular contractions) 03/28/2016   Noted on prior monitor but not quantified   Tobacco use disorder 09/19/2020   Vertigo 09/25/2016    Past Surgical History:  Procedure Laterality Date   FOOT SURGERY     has pin and screws   LEFT AND RIGHT HEART CATHETERIZATION WITH CORONARY ANGIOGRAM N/A 08/04/2012   Procedure: LEFT AND RIGHT HEART CATHETERIZATION WITH CORONARY ANGIOGRAM;  Surgeon: Corky Crafts, MD;  Location: [facility] CATH LAB;  Service: Cardiovascular;  Laterality: N/A;    Social History:  reports that he quit smoking about 17 years ago. His smoking use included cigarettes. He has a 25.00 pack-year smoking history. He has never used smokeless tobacco. He reports that he does not drink alcohol and does not use drugs.  Allergies: Allergies  Allergen Reactions   Celebrex [Celecoxib] Swelling    Swelling in face   Rosuvastatin Hives   Entresto [Sacubitril-Valsartan] Other (See Comments)    Dizziness    Family History:  Family History  Problem Relation Age of Onset   Diabetes Mother    Anxiety disorder Brother    Depression Son    Heart murmur Son    Heart attack Neg Hx    Hypertension Neg Hx    Stroke Neg Hx      Current Outpatient Medications:  albuterol (PROVENTIL) (2.5 MG/3ML) 0.083% nebulizer solution, Take 2.5 mg by nebulization every 6 (six) hours as needed for wheezing or shortness of breath., Disp: , Rfl:    albuterol (VENTOLIN HFA) 108 (90 Base) MCG/ACT inhaler, Inhale 2 puffs into the lungs every 6 (six) hours as needed for wheezing or shortness of breath., Disp: 18 g, Rfl: 2   aspirin EC 81 MG tablet, Take 81 mg by mouth daily. Swallow whole., Disp: , Rfl:    carvedilol (COREG) 6.25 MG tablet, Take 1 tablet (6.25 mg total) by mouth 2 (two) times daily with a meal. NEEDS FOLLOW UP APPOINTMENT FOR ANYMORE REFILLS, Disp:  180 tablet, Rfl: 0   Continuous Blood Gluc Sensor (FREESTYLE LIBRE 2 SENSOR) MISC, 1 each by Does not apply route every 14 (fourteen) days., Disp: 2 each, Rfl: 3   diclofenac Sodium (VOLTAREN) 1 % GEL, Apply 2 g topically daily., Disp: 150 g, Rfl: 2   insulin aspart (NOVOLOG FLEXPEN) 100 UNIT/ML FlexPen, 0-20 Units, Subcutaneous, 3 times daily with meals CBG < 70: Implement Hypoglycemia measures CBG 70 - 120: 0 units CBG 121 - 150: 3 units CBG 151 - 200: 4 units CBG 201 - 250: 7 units CBG 251 - 300: 11 units CBG 301 - 350: 15 units CBG 351 - 400: 20 units CBG > 400: call MD, Disp: 15 mL, Rfl: 0   Insulin Pen Needle 31G X 5 MM MISC, 1 each by Does not apply route daily., Disp: 100 each, Rfl: 3   linaclotide (LINZESS) 145 MCG CAPS capsule, Take 1 capsule (145 mcg total) by mouth daily as needed (constipation)., Disp: 30 capsule, Rfl: 1   losartan (COZAAR) 25 MG tablet, Take 1 tablet (25 mg total) by mouth daily., Disp: 30 tablet, Rfl: 3   Multiple Vitamin (MULTIVITAMIN WITH MINERALS) TABS tablet, Take 1 tablet by mouth daily., Disp: , Rfl:    Semaglutide, 1 MG/DOSE, 4 MG/3ML SOPN, Inject 1 mg as directed once a week., Disp: 3 mL, Rfl: 1   torsemide (DEMADEX) 20 MG tablet, Take 1 tablet Monday, Wednesday, and Friday only, Disp: 40 tablet, Rfl: 3  Review of Systems:  Constitutional: Denies fever, chills, diaphoresis, appetite change and fatigue.  HEENT: Denies photophobia, eye pain, redness, hearing loss, ear pain, congestion, sore throat, rhinorrhea, sneezing, mouth sores, trouble swallowing, neck pain, neck stiffness and tinnitus.   Respiratory: Denies SOB, DOE, cough, chest tightness,  and wheezing.   Cardiovascular: Denies chest pain, palpitations and leg swelling.  Gastrointestinal: Denies nausea, vomiting, abdominal pain, diarrhea, constipation, blood in stool and abdominal distention.  Genitourinary: Denies dysuria, urgency, frequency, hematuria, flank pain and difficulty urinating.  Endocrine:  Denies: hot or cold intolerance, sweats, changes in hair or nails, polyuria, polydipsia. Musculoskeletal: Denies myalgias, back pain, joint swelling, arthralgias and gait problem.  Skin: Denies pallor, rash and wound.  Neurological: Denies dizziness, seizures, syncope, weakness, light-headedness, numbness and headaches.  Hematological: Denies adenopathy. Easy bruising, personal or family bleeding history  Psychiatric/Behavioral: Denies suicidal ideation, mood changes, confusion, nervousness, sleep disturbance and agitation    Physical Exam: Vitals:   04/02/22 1402  BP: 120/78  Pulse: 70  Temp: 98 F (36.7 C)  TempSrc: Oral  SpO2: 98%  Weight: 243 lb 1.6 oz (110.3 kg)    Body mass index is 39.24 kg/m.   Constitutional: NAD, calm, comfortable, obese Eyes: PERRL, lids and conjunctivae normal ENMT: Mucous membranes are moist.  Respiratory: clear to auscultation bilaterally, no wheezing, no crackles. Normal respiratory effort. No accessory  muscle use.  Cardiovascular: Regular rate and rhythm, no murmurs / rubs / gallops. No extremity edema.   Psychiatric: Normal judgment and insight. Alert and oriented x 3. Normal mood.    Impression and Plan:  Type 2 diabetes mellitus without complication, without long-term current use of insulin ([place]) - Plan: POCT glycosylated hemoglobin (Hb A1C), Ambulatory referral to Ophthalmology  Essential hypertension  Mixed hyperlipidemia  Morbid obesity ([place])  Chronic combined systolic and diastolic heart failure ([place])  CKD stage 3 due to type 2 diabetes mellitus (HCC)  -A1c of 7.1 is improved from 7.5 and close to goal.  He continues semaglutide, continue to focus on lifestyle changes. -I remain very concerned about his lipid profile with a total cholesterol of 283, triglycerides 86 and LDL 190.  He has not tolerated atorvastatin, simvastatin or rosuvastatin due to myalgias and unspecified stomach discomfort.  Referral to lipid clinic was placed  last year, however he canceled and never rescheduled.  Phone number provided to patient today. -I will fill out form for window tint, however he has not seen ophthalmologist in a couple years at least, with his diabetes I am concerned that his blurry vision might be a sign of issues.  Ophthalmology referral placed. -He will be seeing his cardiologist in February for his cardiac issues. -Discussed healthy lifestyle, including increased physical activity and better food choices to promote weight loss. -Blood pressure is well-controlled.  Time spent:32 minutes reviewing chart, interviewing and examining patient and formulating plan of care.      Lelon Frohlich, MD [facility]

## 2022-04-02 NOTE — Telephone Encounter (Signed)
Pt was just seen today. Pt called to say he is a little confused about the "lipid clinic" he was told he needed to go to in July of 2024, and is asking for clarification.  Please call Pt back to clarify.

## 2022-04-03 NOTE — Telephone Encounter (Signed)
Patient is aware and declines to take a statin at this time.

## 2022-04-03 NOTE — Telephone Encounter (Signed)
Patient would like to know if he should start medication until his appointment?

## 2022-05-20 ENCOUNTER — Ambulatory Visit (INDEPENDENT_AMBULATORY_CARE_PROVIDER_SITE_OTHER): Payer: Medicaid Other | Admitting: Cardiovascular Disease

## 2022-05-20 ENCOUNTER — Encounter (HOSPITAL_BASED_OUTPATIENT_CLINIC_OR_DEPARTMENT_OTHER): Payer: Self-pay | Admitting: Cardiovascular Disease

## 2022-05-20 VITALS — BP 114/68 | HR 69 | Ht 66.0 in | Wt 240.9 lb

## 2022-05-20 DIAGNOSIS — E7800 Pure hypercholesterolemia, unspecified: Secondary | ICD-10-CM

## 2022-05-20 DIAGNOSIS — I10 Essential (primary) hypertension: Secondary | ICD-10-CM

## 2022-05-20 DIAGNOSIS — I5042 Chronic combined systolic (congestive) and diastolic (congestive) heart failure: Secondary | ICD-10-CM | POA: Diagnosis not present

## 2022-05-20 DIAGNOSIS — I700 Atherosclerosis of aorta: Secondary | ICD-10-CM | POA: Diagnosis not present

## 2022-05-20 DIAGNOSIS — R079 Chest pain, unspecified: Secondary | ICD-10-CM

## 2022-05-20 MED ORDER — DAPAGLIFLOZIN PROPANEDIOL 10 MG PO TABS: 10.0000 mg | ORAL_TABLET | Freq: Every day | ORAL | 3 refills | Status: DC

## 2022-05-20 NOTE — Assessment & Plan Note (Addendum)
Blood pressure is very well-controlled on carvedilol.  And losartan.  Encouraged him to increase his exercise.

## 2022-05-20 NOTE — Assessment & Plan Note (Addendum)
He is euvolemic and doing well.  Continue carvedilol and losartan, and we will add Farxiga 10 mg daily.

## 2022-05-20 NOTE — Assessment & Plan Note (Signed)
Kevin Long continues to have occasional chest pain.  At times it occurs when walking.  Stress test was negative in 2023.  Continue aspirin and carvedilol.  He hasn't tolerated statins.  Lipids are poorly.

## 2022-05-20 NOTE — Assessment & Plan Note (Signed)
Lipids are uncontrolled.  He has not tolerated statins.  Will refer him to our pharmacist to try and get him on PCSK9 inhibitors.  Continue aspirin.

## 2022-05-20 NOTE — Progress Notes (Signed)
Cardiology Office Note:    Date:  05/20/2022   ID:  Kevin Long, DOB [birth year], MRN [id number]  PCP:  Isaac Bliss, Rayford Halsted, MD   [facility] Providers Cardiologist:  None     Referring MD: Isaac Bliss, [place]*   No chief complaint on file.   History of Present Illness:    Kevin Long is a 62 y.o. male with a hx of chronic systolic and diastolic heart failure, aortic atherosclerosis, diabetes, hyperlipidemia, OSA, and obesity here for follow-up. He had a LHC in 2014 which showed normal coronaries.  In 2017, his LVEF was 25-30% but he was not able to get a cardiac MRI due to body size. He intermittently stopped all his medication and struggled with vertigo on Entresto and did not retry it. He had followed-up with the heart failure clinic. In 05/2019 he was infected with COVID, PE and MSSA pneumonia. An Echo 07/2019 revealed LVEF <20% with severe RV dysfunction and an LV thrombus. He was not followed for a year but was admitted 08/2020 with heart failure symptoms. At the time he re-established care. An Echo 12/2020 show improved  LVEF 35-40% and no evidence of LV thrombus. He was last seen 12/2020 and stopped spironolactone because he felt it was causing nose bleeds. Carvedilol was increase to 6.25 mg, torsemide was increased, and he was asked to start back taking spironolactone. An out patient sleep study was ordered.   At his last appointment he reported occasional chest discomfort. He had an exercise Myoview 06/2021 that revealed LVEF 30%with evidence of prior infarct but no ischemia. He achieved 4.6 METS on Bruce protocol. Today, he is feeling alright other than recovering from a recent cold. He has had a few episodes of chest pain that subsided quickly. Most recently the chest pain occurred 2 months ago. At one time he was walking in [place] which he does sometimes for exercise. He denies associated shortness of breath. However, if he is too active he will feel PVCs. Lately he has  noticed some swelling in his knees. He believes torsemide has been managing his abdominal fluid retention. He denies any lightheadedness, headaches, syncope, orthopnea, or PND.    Past Medical History:  Diagnosis Date   Abnormal LFTs    Aortic atherosclerosis ([place]) 05/31/2021   Asthma    Bacteremia 09/19/2020   Chest pain of uncertain etiology 0000000   Degeneration of lumbosacral intervertebral disc 09/19/2020   Diabetes mellitus, type 2 ([place])    Essential hypertension 02/09/2007   Qualifier: Diagnosis of  By: Ronnald Ramp CNA/MA, Jessica     Gout 09/19/2020   HFrEF (heart failure with reduced ejection fraction) ([place]) 09/25/2016   Echocardiogram 09/22/20: EF < 20, global HK, Gr 2 DD, elevated LA pressure, D shaped LV c/w RV vol overload, mod reduced RVSF, mild BAE, trivial MR, severe TR   History of COVID-19    Admit in 07/2019 - course c/b pulmonary embolism, pneumonia, R lung mass (?infectious), MSSA bacteremia, a/c CHF, metabolic encephalopathy, AKI   Hx of Pulmonary Embolism 08/12/2019   In setting of COVID-19 pneumonia with post obstructive pneumonia (pt left hosp AMA)   Hypercholesterolemia    Mass of upper lobe of right lung 08/12/2019   during admit for COVID-19 pneumonia   Morbid obesity ([place]) 04/21/2013   NICM (nonischemic cardiomyopathy) ([place]) 08/13/2015   Normal Cath in 2014   Obesity    OSA (obstructive sleep apnea)    Pain in joint involving pelvic region and thigh 09/19/2020  Pain in joint, lower leg 09/19/2020   Palpitations 08/13/2015   PVC's (premature ventricular contractions) 03/28/2016   Noted on prior monitor but not quantified   Tobacco use disorder 09/19/2020   Vertigo 09/25/2016    Past Surgical History:  Procedure Laterality Date   FOOT SURGERY     has pin and screws   LEFT AND RIGHT HEART CATHETERIZATION WITH CORONARY ANGIOGRAM N/A 08/04/2012   Procedure: LEFT AND RIGHT HEART CATHETERIZATION WITH CORONARY ANGIOGRAM;  Surgeon: Jettie Booze, MD;   Location: [facility] CATH LAB;  Service: Cardiovascular;  Laterality: N/A;    Current Medications: Current Meds  Medication Sig   albuterol (PROVENTIL) (2.5 MG/3ML) 0.083% nebulizer solution Take 2.5 mg by nebulization every 6 (six) hours as needed for wheezing or shortness of breath.   albuterol (VENTOLIN HFA) 108 (90 Base) MCG/ACT inhaler Inhale 2 puffs into the lungs every 6 (six) hours as needed for wheezing or shortness of breath.   aspirin EC 81 MG tablet Take 81 mg by mouth daily. Swallow whole.   carvedilol (COREG) 6.25 MG tablet Take 1 tablet (6.25 mg total) by mouth 2 (two) times daily with a meal. NEEDS FOLLOW UP APPOINTMENT FOR ANYMORE REFILLS   Continuous Blood Gluc Sensor (FREESTYLE LIBRE 2 SENSOR) MISC 1 each by Does not apply route every 14 (fourteen) days.   dapagliflozin propanediol (FARXIGA) 10 MG TABS tablet Take 1 tablet (10 mg total) by mouth daily before breakfast.   diclofenac Sodium (VOLTAREN) 1 % GEL Apply 2 g topically daily.   insulin aspart (NOVOLOG FLEXPEN) 100 UNIT/ML FlexPen 0-20 Units, Subcutaneous, 3 times daily with meals CBG < 70: Implement Hypoglycemia measures CBG 70 - 120: 0 units CBG 121 - 150: 3 units CBG 151 - 200: 4 units CBG 201 - 250: 7 units CBG 251 - 300: 11 units CBG 301 - 350: 15 units CBG 351 - 400: 20 units CBG > 400: call MD   Insulin Pen Needle 31G X 5 MM MISC 1 each by Does not apply route daily.   linaclotide (LINZESS) 145 MCG CAPS capsule Take 1 capsule (145 mcg total) by mouth daily as needed (constipation).   Semaglutide, 1 MG/DOSE, 4 MG/3ML SOPN Inject 1 mg as directed once a week.   torsemide (DEMADEX) 20 MG tablet Take 1 tablet Monday, Wednesday, and Friday only     Allergies:   Celebrex [celecoxib], Rosuvastatin, and Entresto [sacubitril-valsartan]   Social History   Socioeconomic History   Marital status: Married    Spouse name: Not on file   Number of children: Not on file   Years of education: Not on file   Highest education level:  Not on file  Occupational History   Not on file  Tobacco Use   Smoking status: Former    Packs/day: 1.00    Years: 25.00    Total pack years: 25.00    Types: Cigarettes    Quit date: 03/31/2005    Years since quitting: 17.1   Smokeless tobacco: Never  Vaping Use   Vaping Use: Never used  Substance and Sexual Activity   Alcohol use: No   Drug use: No   Sexual activity: Not on file  Other Topics Concern   Not on file  Social History Narrative   Not on file   Social Determinants of Health   Financial Resource Strain: Low Risk  (09/25/2021)   Overall Financial Resource Strain (CARDIA)    Difficulty of Paying Living Expenses: Not hard at all  Food Insecurity: No Food Insecurity (09/25/2021)   Hunger Vital Sign    Worried About Running Out of Food in the Last Year: Never true    [place] in the Last Year: Never true  Transportation Needs: Unknown (09/25/2021)   PRAPARE - Transportation    Lack of Transportation (Medical): Patient refused    Lack of Transportation (Non-Medical): Patient refused  Physical Activity: Unknown (09/25/2021)   Exercise Vital Sign    Days of Exercise per Week: Patient refused    Minutes of Exercise per Session: Not on file  Stress: Unknown (09/25/2021)   [place]    Feeling of Stress : Patient refused  Social Connections: Unknown (09/25/2021)   Social Connection and Isolation Panel [NHANES]    Frequency of Communication with Friends and Family: Once a week    Frequency of Social Gatherings with Friends and Family: Patient refused    Attends Religious Services: Patient refused    Marine scientist or Organizations: Not on file    Attends Archivist Meetings: Not on file    Marital Status: Married     Family History: The patient's family history includes Anxiety disorder in his brother; Depression in his son; Diabetes in his mother; Heart murmur in his son. There is  no history of Heart attack, Hypertension, or Stroke.  ROS:   Please see the history of present illness.    (+) Rare chest pain (+) LE edema in knees (+) Rare palpitations All other systems reviewed and are negative.  EKGs/Labs/Other Studies Reviewed:    The following studies were reviewed today:  [place]um 06/10/2021:   Findings are consistent with prior myocardial infarction. The study is high risk.   Poor exercise capacity, achieved 4.6 METS   Peak heart rate 129 bpm, 81% max age predicted HR.  Since unable to reach target heart rate, was switched to Lexiscan   Normal BP response to exercise   No ST deviation was noted.   LV perfusion is abnormal. There is no evidence of ischemia. There is evidence of infarction. Defect 1: There is a small defect with mild reduction in uptake present in the apical to mid inferior location(s) that is fixed. There is abnormal wall motion in the defect area. Consistent with infarction. Defect 2: There is a medium defect with mild reduction in uptake present in the apical anterior, lateral and apex location(s) that is fixed. There is abnormal wall motion in the defect area. Consistent with infarction.   Left ventricular function is abnormal. Nuclear stress EF: 30 %. The left ventricular ejection fraction is moderately decreased (30-44%). End diastolic cavity size is severely enlarged. End systolic cavity size is severely enlarged.   Prior study available for comparison from 10/05/2009.   Poor exercise capacity, achieved 4.6 METS and unable to reach target heart rate, study was switched to Lexiscan Fixed perfusion defect in apical to mid inferior wall and apical anterior/lateral walls and apex with hypokinesis in these regions suggesting infarct Severe systolic dysfunction (EF [id number]) High risk study due to systolic dysfunction.  No ischemia  Echo 01/01/21  1. Left ventricular ejection fraction, by estimation, is 35 to 40%. Left  ventricular  ejection fraction by 2D MOD biplane is 39.5 %. The left  ventricle has moderately decreased function. The left ventricle  demonstrates global hypokinesis. The left  ventricular internal cavity size was mildly dilated. Left ventricular  diastolic parameters are consistent with Grade  II diastolic dysfunction  (pseudonormalization). Elevated left ventricular end-diastolic pressure.  The E/e' is 20.   2. Right ventricular systolic function is mildly reduced. The right  ventricular size is normal. There is normal pulmonary artery systolic  pressure. The estimated right ventricular systolic pressure is [id number] mmHg.   3. The mitral valve is abnormal. Mild mitral valve regurgitation.   4. The aortic valve is tricuspid. Aortic valve regurgitation is not  visualized.   5. The inferior vena cava is normal in size with greater than 50%  respiratory variability, suggesting right atrial pressure of 3 mmHg.  Comparison(s): Changes from prior study are noted. 09/22/2020: LVEF <20%, global hypokinesis.   Echo 09/22/20  1. Left ventricular ejection fraction, by estimation, is <20%. The left  ventricle has severely decreased function. The left ventricle demonstrates  global hypokinesis. The left ventricular internal cavity size was  moderately dilated. Left ventricular  diastolic parameters are consistent with Grade II diastolic dysfunction  (pseudonormalization). Elevated left atrial pressure. There is the  interventricular septum is flattened in diastole ('D' shaped left  ventricle), consistent with right ventricular  volume overload.   2. Right ventricular systolic function is moderately reduced. The right  ventricular size is moderately enlarged.   3. Left atrial size was mildly dilated.   4. Right atrial size was mildly dilated.   5. The mitral valve is normal in structure. Trivial mitral valve  regurgitation.   6. The tricuspid valve is abnormal. Tricuspid valve regurgitation is  severe.   7. The  aortic valve is tricuspid. Aortic valve regurgitation is not  visualized. No aortic stenosis is present.   EKG:  EKG is personally reviewed. 05/20/2022:  Sinus rhythm. Rate 69 bpm.   Recent Labs: 09/26/2021: ALT 13; BUN 15; Creatinine, Ser 1.32; Hemoglobin 14.9; Platelets 338.0; Potassium 3.7; Sodium 138   Recent Lipid Panel    Component Value Date/Time   CHOL 283 (H) 09/26/2021 1027   CHOL 300 (H) 08/13/2021 0846   TRIG 86.0 09/26/2021 1027   HDL 75.20 09/26/2021 1027   HDL 84 08/13/2021 0846   CHOLHDL 4 09/26/2021 1027   VLDL 17.2 09/26/2021 1027   LDLCALC 190 (H) 09/26/2021 1027   LDLCALC 197 (H) 08/13/2021 0846   LDLCALC 193 (H) 04/01/2021 1422     Physical Exam:    VS:  BP 114/68 (BP Location: Right Arm, Patient Position: Sitting, Cuff Size: Large)   Pulse 69   Ht 5' 6"$  (1.676 m)   Wt 240 lb 14.4 oz (109.3 kg)   BMI 38.88 kg/m  , BMI Body mass index is 38.88 kg/m. GENERAL:  Well appearing HEENT: Pupils equal round and reactive, fundi not visualized, oral mucosa unremarkable NECK:  No jugular venous distention, waveform within normal limits, carotid upstroke brisk and symmetric, no bruits, no thyromegaly LUNGS:  Clear to auscultation bilaterally HEART:  RRR.  PMI not displaced or sustained,S1 and S2 within normal limits, no S3, no S4, no clicks, no rubs, no murmurs ABD:  Flat, positive bowel sounds normal in frequency in pitch, no bruits, no rebound, no guarding, no midline pulsatile mass, no hepatomegaly, no splenomegaly EXT:  2 plus pulses throughout, no edema, no cyanosis no clubbing SKIN:  No rashes no nodules NEURO:  Cranial nerves II through XII grossly intact, motor grossly intact throughout PSYCH:  Cognitively intact, oriented to person place and time   ASSESSMENT:    1. Chest pain of uncertain etiology   2. Pure hypercholesterolemia   3. Essential hypertension  4. Chronic combined systolic and diastolic heart failure ([place])   5. Aortic atherosclerosis  ([place])   6. Morbid obesity ([place])     PLAN:    Chest pain of uncertain etiology Mr. Matheny continues to have occasional chest pain.  At times it occurs when walking.  Stress test was negative in 2023.  Continue aspirin and carvedilol.  He hasn't tolerated statins.  Lipids are poorly.    Essential hypertension Blood pressure is very well-controlled on carvedilol.  And losartan.  Encouraged him to increase his exercise.  Chronic combined systolic and diastolic heart failure ([place]) He is euvolemic and doing well.  Continue carvedilol and losartan, and we will add Farxiga 10 mg daily.  Aortic atherosclerosis ([facility]) Lipids are uncontrolled.  He has not tolerated statins.  Will refer him to our pharmacist to try and get him on PCSK9 inhibitors.  Continue aspirin.  Morbid obesity ([place]) Continue to encourage diet and exercise.  Continue Ozempic.   Disposition:  FU with Herminio Kniskern C. Oval Linsey, MD, [facility] in 6 months.  Medication Adjustments/Labs and Tests Ordered: Current medicines are reviewed at length with the patient today.  Concerns regarding medicines are outlined above.   Orders Placed This Encounter  Procedures   Lipid panel   Comprehensive metabolic panel   AMB Referral to Heartcare Pharm-D   EKG 12-Lead   Meds ordered this encounter  Medications   dapagliflozin propanediol (FARXIGA) 10 MG TABS tablet    Sig: Take 1 tablet (10 mg total) by mouth daily before breakfast.    Dispense:  90 tablet    Refill:  3   Patient Instructions  Medication Instructions:  START FARXIGA 10 MG DAILY   *If you need a refill on your cardiac medications before your next appointment, please call your pharmacy*  Lab Work: LP/CMET IN 3 MONTHS ABOUT 1 WEEK PRIOR TO FOLLOW UP  If you have labs (blood work) drawn today and your tests are completely normal, you will receive your results only by: MyChart Message (if you have MyChart) OR A paper copy in the mail If you have any lab test that is abnormal  or we need to change your treatment, we will call you to review the results.  Testing/Procedures: NONE  Follow-Up: At [facility], you and your health needs are our priority.  As part of our continuing mission to provide you with exceptional heart care, we have created designated Provider Care Teams.  These Care Teams include your primary Cardiologist (physician) and Advanced Practice Providers (APPs -  Physician Assistants and Nurse Practitioners) who all work together to provide you with the care you need, when you need it.  We recommend signing up for the patient portal called "MyChart".  Sign up information is provided on this After Visit Summary.  MyChart is used to connect with patients for Virtual Visits (Telemedicine).  Patients are able to view lab/test results, encounter notes, upcoming appointments, etc.  Non-urgent messages can be sent to your provider as well.   To learn more about what you can do with MyChart, go to [email or web address].    Your next appointment:   3 month(s)  Provider:   Laurann Montana, NP   [address] [facility]    [facility] FOR LIPID MANAGEMENT     I,Mathew Stumpf,acting as a scribe for Skeet Latch, MD.,have documented all relevant documentation on the behalf of Skeet Latch, MD,as directed by  Skeet Latch, MD while in the presence of Skeet Latch, MD.  I, Janiel Crisostomo C. Oval Linsey, MD have reviewed all documentation for this visit.  The documentation of the exam, diagnosis, procedures, and orders on 05/20/2022 are all accurate and complete.   Signed, Skeet Latch, MD  05/20/2022 1:19 PM    [facility]

## 2022-05-20 NOTE — Assessment & Plan Note (Signed)
Continue to encourage diet and exercise.  Continue Ozempic.

## 2022-05-20 NOTE — Patient Instructions (Addendum)
Medication Instructions:  START FARXIGA 10 MG DAILY   *If you need a refill on your cardiac medications before your next appointment, please call your pharmacy*  Lab Work: LP/CMET IN 3 MONTHS ABOUT 1 WEEK PRIOR TO FOLLOW UP  If you have labs (blood work) drawn today and your tests are completely normal, you will receive your results only by: [place] (if you have MyChart) OR A paper copy in the mail If you have any lab test that is abnormal or we need to change your treatment, we will call you to review the results.  Testing/Procedures: NONE  Follow-Up: At [facility], you and your health needs are our priority.  As part of our continuing mission to provide you with exceptional heart care, we have created designated Provider Care Teams.  These Care Teams include your primary Cardiologist (physician) and Advanced Practice Providers (APPs -  Physician Assistants and Nurse Practitioners) who all work together to provide you with the care you need, when you need it.  We recommend signing up for the patient portal called "MyChart".  Sign up information is provided on this After Visit Summary.  MyChart is used to connect with patients for Virtual Visits (Telemedicine).  Patients are able to view lab/test results, encounter notes, upcoming appointments, etc.  Non-urgent messages can be sent to your provider as well.   To learn more about what you can do with MyChart, go to [email or web address].    Your next appointment:   3 month(s)  Provider:   Laurann Montana, NP   [address] [place] FOR LIPID MANAGEMENT

## 2022-05-25 ENCOUNTER — Other Ambulatory Visit: Payer: Self-pay | Admitting: Internal Medicine

## 2022-05-25 DIAGNOSIS — E1169 Type 2 diabetes mellitus with other specified complication: Secondary | ICD-10-CM

## 2022-05-29 ENCOUNTER — Other Ambulatory Visit (HOSPITAL_COMMUNITY): Payer: Self-pay | Admitting: Cardiology

## 2022-06-16 NOTE — Progress Notes (Unsigned)
Office Visit    Patient Name: Kevin Long Date of Encounter: 06/17/2022  Primary Care Provider:  Isaac Bliss, Rayford Halsted, MD Primary Cardiologist:  None  Chief Complaint    Hyperlipidemia - familial  Significant Past Medical History   CAD Prior MI seen on stress test; aortic atherosclerosis seen on CT (08/2019)  CHF 3/23 LVEF 30% by stress test; on losartan, carveilol,   DM2 1/24 A1c 7.1 - on Novolog, Ozempic  OSA Follow up sleep study  obesity 2/24 visit BMI 38.88     Allergies  Allergen Reactions   Celebrex [Celecoxib] Swelling    Swelling in face   Rosuvastatin Hives   Entresto [Sacubitril-Valsartan] Other (See Comments)    Dizziness    History of Present Illness    Kevin Long is a 62 y.o. male patient of Dr Oval Linsey, in the office today to discuss options for cholesterol management.  His most recent LDL was last summer at 190, and before that at 197.  His chart indicates history of MI as well.  Because of longstanding pain/arthritis issues, he was on Darvocet for several years, starting in 2006 until the recall in 2010.  He believes it is the cause of much of his heart disease at this time, and is 100 % service connect with the [place].   He continues to have arthritic pain, and uses diclofenac gel to help.    Today he is in the office with his wife to discuss other options for lowering LDL.    Insurance Carrier:  [employer] ([place] [employer])/ DOD?    LDL Cholesterol goal:  LDL <55  Current Medications:   none  Previously tried:  rosuvastatin  - rash; atorvastatin, pravastatin, simvastatin - myalgias  Family Hx:  parents diseased; sister has high blood pressure (truck drivers); children healthy  Social Hx: Tobacco: no - quit 20+ years ago Alcohol:   no   Diet: mostly  home cooked ; meats are all baked or broiled; mix of meats; beans , greens      Exercise: none, walks in big box stores   Accessory Clinical Findings   Lab Results  Component Value  Date   CHOL 283 (H) 09/26/2021   HDL 75.20 09/26/2021   LDLCALC 190 (H) 09/26/2021   TRIG 86.0 09/26/2021   CHOLHDL 4 09/26/2021    Lab Results  Component Value Date   ALT 13 09/26/2021   AST 22 09/26/2021   ALKPHOS 85 09/26/2021   BILITOT 1.1 09/26/2021   Lab Results  Component Value Date   CREATININE 1.32 09/26/2021   BUN 15 09/26/2021   NA 138 09/26/2021   K 3.7 09/26/2021   CL 100 09/26/2021   CO2 27 09/26/2021   Lab Results  Component Value Date   HGBA1C 7.1 (A) 04/02/2022    Home Medications    Current Outpatient Medications  Medication Sig Dispense Refill   albuterol (PROVENTIL) (2.5 MG/3ML) 0.083% nebulizer solution Take 2.5 mg by nebulization every 6 (six) hours as needed for wheezing or shortness of breath.     albuterol (VENTOLIN HFA) 108 (90 Base) MCG/ACT inhaler Inhale 2 puffs into the lungs every 6 (six) hours as needed for wheezing or shortness of breath. 18 g 2   aspirin EC 81 MG tablet Take 81 mg by mouth daily. Swallow whole.     carvedilol (COREG) 6.25 MG tablet TAKE 1 TABLET BY MOUTH TWICE DAILY WITH A MEAL. NEEDS FOLLOW UP APPOINTMENT FOR ANYMORE REFILLS. 180 tablet 0  Continuous Blood Gluc Sensor (FREESTYLE LIBRE 2 SENSOR) MISC 1 each by Does not apply route every 14 (fourteen) days. 2 each 3   diclofenac Sodium (VOLTAREN) 1 % GEL Apply 2 g topically daily. 150 g 2   insulin aspart (NOVOLOG FLEXPEN) 100 UNIT/ML FlexPen 0-20 Units, Subcutaneous, 3 times daily with meals CBG < 70: Implement Hypoglycemia measures CBG 70 - 120: 0 units CBG 121 - 150: 3 units CBG 151 - 200: 4 units CBG 201 - 250: 7 units CBG 251 - 300: 11 units CBG 301 - 350: 15 units CBG 351 - 400: 20 units CBG > 400: call MD 15 mL 0   Insulin Pen Needle 31G X 5 MM MISC 1 each by Does not apply route daily. 100 each 3   Iron-Vitamins (GERITOL COMPLETE) TABS Take 1 tablet by mouth daily.     linaclotide (LINZESS) 145 MCG CAPS capsule Take 1 capsule (145 mcg total) by mouth daily as needed  (constipation). 30 capsule 1   OZEMPIC, 1 MG/DOSE, 4 MG/3ML SOPN INJECT 1 MG AS DIRECTED ONCE A WEEK. 9 mL 0   torsemide (DEMADEX) 20 MG tablet Take 1 tablet by mouth every other day 45 tablet 1   dapagliflozin propanediol (FARXIGA) 10 MG TABS tablet Take 1 tablet (10 mg total) by mouth daily before breakfast. (Patient not taking: Reported on 06/17/2022) 90 tablet 3   No current facility-administered medications for this visit.     Assessment & Plan    Chronic combined systolic and diastolic heart failure ([place]) Patient notes edema worse on weekends because takes torsemide MWF.  By Monday morning is usually up a few pounds.  Will have him increase torsemide to 20 mg every other day.    Hyperlipidemia Assessment: Patient with ASCVD/familial hyperlipidemia not at LDL goal of < 55 Most recent LDL 190 on 08/2021 Labs are > 4 months old, will have new labs drawn today Not able to tolerate statins (atorvastatin, simvastatin, pravastatin) secondary to myalgias, rosuvastatin caused rash Reviewed options for lowering LDL cholesterol, including ezetimibe, PCSK-9 inhibitors, bempedoic acid and inclisiran.  Discussed mechanisms of action, dosing, side effects, potential decreases in LDL cholesterol and costs.  Also reviewed potential options for patient assistance.  Plan: Patient agreeable to starting Repatha Pushtronix 420 mg once monthly Repeat labs after:  3 doses Lipid Liver function Is on Medicaid with 100% service connect through [facility], PharmD CPP [facility] [address] [place]  [place], [place] [address] [phone]  06/17/2022, 2:34 PM

## 2022-06-17 ENCOUNTER — Ambulatory Visit (INDEPENDENT_AMBULATORY_CARE_PROVIDER_SITE_OTHER): Payer: Medicaid Other | Admitting: Pharmacist Clinician (PhC)/ Clinical Pharmacy Specialist

## 2022-06-17 ENCOUNTER — Encounter (HOSPITAL_BASED_OUTPATIENT_CLINIC_OR_DEPARTMENT_OTHER): Payer: Self-pay | Admitting: Pharmacist Clinician (PhC)/ Clinical Pharmacy Specialist

## 2022-06-17 VITALS — Ht 66.0 in | Wt 239.4 lb

## 2022-06-17 DIAGNOSIS — I5042 Chronic combined systolic (congestive) and diastolic (congestive) heart failure: Secondary | ICD-10-CM | POA: Diagnosis not present

## 2022-06-17 DIAGNOSIS — E7800 Pure hypercholesterolemia, unspecified: Secondary | ICD-10-CM

## 2022-06-17 MED ORDER — TORSEMIDE 20 MG PO TABS: ORAL_TABLET | ORAL | 1 refills | Status: DC

## 2022-06-17 NOTE — Assessment & Plan Note (Signed)
Assessment: Patient with ASCVD/familial hyperlipidemia not at LDL goal of < 55 Most recent LDL 190 on 08/2021 Labs are > 4 months old, will have new labs drawn today Not able to tolerate statins (atorvastatin, simvastatin, pravastatin) secondary to myalgias, rosuvastatin caused rash Reviewed options for lowering LDL cholesterol, including ezetimibe, PCSK-9 inhibitors, bempedoic acid and inclisiran.  Discussed mechanisms of action, dosing, side effects, potential decreases in LDL cholesterol and costs.  Also reviewed potential options for patient assistance.  Plan: Patient agreeable to starting Repatha Pushtronix 420 mg once monthly Repeat labs after:  3 doses Lipid Liver function Is on Medicaid with 100% service connect through [place]

## 2022-06-17 NOTE — Patient Instructions (Signed)
Your Results:             Your most recent labs Goal  Total Cholesterol 283 < 200  Triglycerides 86 < 150  HDL (happy/good cholesterol) 75.2 > 40  LDL (lousy/bad cholesterol 190 < 55   Medication changes:  We will start the process to get Repatha covered by your insurance.  Once the prior authorization is complete, I will call/send a MyChart message to let you know and confirm pharmacy information.   You will use 1 pod every month  Lab orders:  We want to repeat labs after 2-3 months.  We will send you a lab order to remind you once we get closer to that time.    Patient Assistance:  The [employer] foundation offers assistance to help pay for medication copays.  They will cover copays for all cholesterol lowering meds, including statins, fibrates, omega-3 oils, ezetimibe, Repatha, Praluent, Nexletol, Nexlizet.  The cards are usually good for $2,500 or 12 months, whichever comes first. Go to healthwellfoundation.org Click on "Apply Now" Answer questions as to whom is applying (patient or representative) Your disease fund will be "hypercholesterolemia - Medicare access" Select the cholesterol medication you need assistance with (Repatha, Praluent, Nexlizet...) They will ask question about qualifying diagnosis - you can mark "yes"; and do you have insurance coverage.   When they ask what type of assistance you are interested in - "copay assistance" When you submit, the approval is usually within minutes.  You will need to print the card information from the site You will need to show this information to your pharmacy, they will bill your Medicare Part D plan first -then bill [employer] Well --for the copay.   You can also call them at [phone], although the hold times can be quite long.   Thank you for choosing [facility]

## 2022-06-17 NOTE — Assessment & Plan Note (Signed)
Patient notes edema worse on weekends because takes torsemide MWF.  By Monday morning is usually up a few pounds.  Will have him increase torsemide to 20 mg every other day.

## 2022-06-18 LAB — LIPID PANEL
Chol/HDL Ratio: 3.7 ratio (ref 0.0–5.0)
Cholesterol, Total: 266 mg/dL — ABNORMAL HIGH (ref 100–199)
HDL: 72 mg/dL (ref >39)
LDL Chol Calc (NIH): 177 mg/dL — ABNORMAL HIGH (ref 0–99)
Triglycerides: 98 mg/dL (ref 0–149)
VLDL Cholesterol Cal: 17 mg/dL (ref 5–40)

## 2022-06-19 ENCOUNTER — Other Ambulatory Visit: Payer: Self-pay | Admitting: Internal Medicine

## 2022-07-02 ENCOUNTER — Telehealth: Payer: Self-pay | Admitting: Internal Medicine

## 2022-07-02 ENCOUNTER — Ambulatory Visit: Payer: Medicaid Other | Admitting: Internal Medicine

## 2022-07-02 ENCOUNTER — Encounter: Payer: Self-pay | Admitting: Internal Medicine

## 2022-07-02 VITALS — BP 128/74 | HR 55 | Temp 97.9°F | Wt 238.9 lb

## 2022-07-02 DIAGNOSIS — E782 Mixed hyperlipidemia: Secondary | ICD-10-CM

## 2022-07-02 DIAGNOSIS — I5042 Chronic combined systolic (congestive) and diastolic (congestive) heart failure: Secondary | ICD-10-CM | POA: Diagnosis not present

## 2022-07-02 DIAGNOSIS — I10 Essential (primary) hypertension: Secondary | ICD-10-CM

## 2022-07-02 DIAGNOSIS — E1169 Type 2 diabetes mellitus with other specified complication: Secondary | ICD-10-CM

## 2022-07-02 DIAGNOSIS — G4733 Obstructive sleep apnea (adult) (pediatric): Secondary | ICD-10-CM

## 2022-07-02 DIAGNOSIS — E119 Type 2 diabetes mellitus without complications: Secondary | ICD-10-CM

## 2022-07-02 LAB — POCT GLYCOSYLATED HEMOGLOBIN (HGB A1C): Hemoglobin A1C: 7.5 % — AB (ref 4.0–5.6)

## 2022-07-02 MED ORDER — FREESTYLE LIBRE 2 SENSOR MISC: 6 refills | Status: DC

## 2022-07-02 MED ORDER — FREESTYLE LIBRE 2 SENSOR MISC
6 refills | Status: DC
Start: 2022-07-02 — End: 2022-10-01

## 2022-07-02 MED ORDER — SEMAGLUTIDE (2 MG/DOSE) 8 MG/3ML ~~LOC~~ SOPN
2.0000 mg | PEN_INJECTOR | SUBCUTANEOUS | 2 refills | Status: DC
Start: 2022-07-02 — End: 2022-11-17

## 2022-07-02 NOTE — Telephone Encounter (Signed)
New Rx sent.

## 2022-07-02 NOTE — Telephone Encounter (Signed)
[employer] tech is calling to verify Continuous Blood Gluc Sensor (FREESTYLE LIBRE 2 SENSOR) MISC qty

## 2022-07-02 NOTE — Progress Notes (Signed)
Established Patient Office Visit     CC/Reason for Visit: Follow-up chronic conditions  HPI: Kevin Long is a 62 y.o. male who is coming in today for the above mentioned reasons. Past Medical History is significant for: Hypertension, hyperlipidemia, type 2 diabetes, morbid obesity, chronic combined CHF.  He is doing well and has no concerns.  Unfortunately he discontinued taking farxiga as it "did not agree with his system".  He is eating multiple candy bars a day.   Past Medical/Surgical History: Past Medical History:  Diagnosis Date   Abnormal LFTs    Aortic atherosclerosis 05/31/2021   Asthma    Bacteremia 09/19/2020   Chest pain of uncertain etiology 0000000   Degeneration of lumbosacral intervertebral disc 09/19/2020   Diabetes mellitus, type 2    Essential hypertension 02/09/2007   Qualifier: Diagnosis of  By: Ronnald Ramp CNA/MA, Jessica     Gout 09/19/2020   HFrEF (heart failure with reduced ejection fraction) 09/25/2016   Echocardiogram 09/22/20: EF < 20, global HK, Gr 2 DD, elevated LA pressure, D shaped LV c/w RV vol overload, mod reduced RVSF, mild BAE, trivial MR, severe TR   History of COVID-19    Admit in 07/2019 - course c/b pulmonary embolism, pneumonia, R lung mass (?infectious), MSSA bacteremia, a/c CHF, metabolic encephalopathy, AKI   Hx of Pulmonary Embolism 08/12/2019   In setting of COVID-19 pneumonia with post obstructive pneumonia (pt left hosp AMA)   Hypercholesterolemia    Mass of upper lobe of right lung 08/12/2019   during admit for COVID-19 pneumonia   Morbid obesity 04/21/2013   NICM (nonischemic cardiomyopathy) 08/13/2015   Normal Cath in 2014   Obesity    OSA (obstructive sleep apnea)    Pain in joint involving pelvic region and thigh 09/19/2020   Pain in joint, lower leg 09/19/2020   Palpitations 08/13/2015   PVC's (premature ventricular contractions) 03/28/2016   Noted on prior monitor but not quantified   Tobacco use disorder 09/19/2020    Vertigo 09/25/2016    Past Surgical History:  Procedure Laterality Date   FOOT SURGERY     has pin and screws   LEFT AND RIGHT HEART CATHETERIZATION WITH CORONARY ANGIOGRAM N/A 08/04/2012   Procedure: LEFT AND RIGHT HEART CATHETERIZATION WITH CORONARY ANGIOGRAM;  Surgeon: Jettie Booze, MD;  Location: [facility] CATH LAB;  Service: Cardiovascular;  Laterality: N/A;    Social History:  reports that he quit smoking about 17 years ago. His smoking use included cigarettes. He has a 25.00 pack-year smoking history. He has never used smokeless tobacco. He reports that he does not drink alcohol and does not use drugs.  Allergies: Allergies  Allergen Reactions   Celebrex [Celecoxib] Swelling    Swelling in face   Rosuvastatin Hives   Entresto [Sacubitril-Valsartan] Other (See Comments)    Dizziness    Family History:  Family History  Problem Relation Age of Onset   Diabetes Mother    Anxiety disorder Brother    Depression Son    Heart murmur Son    Heart attack Neg Hx    Hypertension Neg Hx    Stroke Neg Hx      Current Outpatient Medications:    albuterol (PROVENTIL) (2.5 MG/3ML) 0.083% nebulizer solution, Take 2.5 mg by nebulization every 6 (six) hours as needed for wheezing or shortness of breath., Disp: , Rfl:    albuterol (VENTOLIN HFA) 108 (90 Base) MCG/ACT inhaler, Inhale 2 puffs into the lungs every 6 (six) hours  as needed for wheezing or shortness of breath., Disp: 18 g, Rfl: 2   aspirin EC 81 MG tablet, Take 81 mg by mouth daily. Swallow whole., Disp: , Rfl:    carvedilol (COREG) 6.25 MG tablet, TAKE 1 TABLET BY MOUTH TWICE DAILY WITH A MEAL. NEEDS FOLLOW UP APPOINTMENT FOR ANYMORE REFILLS., Disp: 180 tablet, Rfl: 0   diclofenac Sodium (VOLTAREN) 1 % GEL, Apply 2 g topically daily., Disp: 150 g, Rfl: 2   insulin aspart (NOVOLOG FLEXPEN) 100 UNIT/ML FlexPen, 0-20 Units, Subcutaneous, 3 times daily with meals CBG < 70: Implement Hypoglycemia measures CBG 70 - 120: 0 units CBG  121 - 150: 3 units CBG 151 - 200: 4 units CBG 201 - 250: 7 units CBG 251 - 300: 11 units CBG 301 - 350: 15 units CBG 351 - 400: 20 units CBG > 400: call MD, Disp: 15 mL, Rfl: 0   Insulin Pen Needle 31G X 5 MM MISC, 1 each by Does not apply route daily., Disp: 100 each, Rfl: 3   Iron-Vitamins (GERITOL COMPLETE) TABS, Take 1 tablet by mouth daily., Disp: , Rfl:    linaclotide (LINZESS) 145 MCG CAPS capsule, Take 1 capsule (145 mcg total) by mouth daily as needed (constipation)., Disp: 30 capsule, Rfl: 1   Semaglutide, 2 MG/DOSE, 8 MG/3ML SOPN, Inject 2 mg as directed once a week., Disp: 3 mL, Rfl: 2   torsemide (DEMADEX) 20 MG tablet, Take 1 tablet by mouth every other day, Disp: 45 tablet, Rfl: 1   Continuous Blood Gluc Sensor (FREESTYLE LIBRE 2 SENSOR) MISC, USE 1 SENSOR EVERY 14 DAYS, Disp: 2 each, Rfl: 6  Review of Systems:  Negative unless indicated in HPI.   Physical Exam: Vitals:   07/02/22 1403  BP: 128/74  Pulse: (!) 55  Temp: 97.9 F (36.6 C)  TempSrc: Oral  SpO2: 99%  Weight: 238 lb 14.4 oz (108.4 kg)    Body mass index is 38.56 kg/m.   Physical Exam Vitals reviewed.  Constitutional:      Appearance: Normal appearance.  HENT:     Head: Normocephalic and atraumatic.  Eyes:     Conjunctiva/sclera: Conjunctivae normal.     Pupils: Pupils are equal, round, and reactive to light.  Cardiovascular:     Rate and Rhythm: Normal rate and regular rhythm.  Pulmonary:     Effort: Pulmonary effort is normal.     Breath sounds: Normal breath sounds.  Skin:    General: Skin is warm and dry.  Neurological:     General: No focal deficit present.     Mental Status: He is alert and oriented to person, place, and time.  Psychiatric:        Mood and Affect: Mood normal.        Behavior: Behavior normal.        Thought Content: Thought content normal.        Judgment: Judgment normal.      Impression and Plan:  Type 2 diabetes mellitus with other specified complication,  without long-term current use of insulin - Plan: Continuous Blood Gluc Sensor (FREESTYLE LIBRE 2 SENSOR) MISC, Semaglutide, 2 MG/DOSE, 8 MG/3ML SOPN  Type 2 diabetes mellitus without complication, without long-term current use of insulin - Plan: POC HgB A1c  Essential hypertension  Mixed hyperlipidemia  OBSTRUCTIVE SLEEP APNEA  Morbid obesity  Chronic combined systolic and diastolic heart failure  -123456 of 7.5 is above goal.  Increase Ozempic from 1 to 2 mg.  He  does not wish to resume [place]. -LDL remains suboptimal.  He is working with cardiology on getting started on a PCSK9 inhibitor. -Blood pressure is well-controlled. -Volume status is compensated.  Time spent:32 minutes reviewing chart, interviewing and examining patient and formulating plan of care.     Lelon Frohlich, MD [facility]

## 2022-07-07 ENCOUNTER — Telehealth: Payer: Self-pay

## 2022-07-07 ENCOUNTER — Other Ambulatory Visit (HOSPITAL_COMMUNITY): Payer: Self-pay

## 2022-07-07 ENCOUNTER — Telehealth (HOSPITAL_BASED_OUTPATIENT_CLINIC_OR_DEPARTMENT_OTHER): Payer: Self-pay

## 2022-07-07 DIAGNOSIS — E7800 Pure hypercholesterolemia, unspecified: Secondary | ICD-10-CM

## 2022-07-07 MED ORDER — REPATHA PUSHTRONEX SYSTEM 420 MG/3.5ML ~~LOC~~ SOCT
420.00 mg | SUBCUTANEOUS | 6 refills | Status: DC
Start: 2022-07-07 — End: 2022-10-06

## 2022-07-07 NOTE — Telephone Encounter (Signed)
Pharmacy Patient Advocate Encounter   Received notification from [facility] that prior authorization for REPATHA is needed.    PA submitted on 07/07/22 Key [id number] Status is pending  Haze Rushing, CPhT Pharmacy Patient Advocate Specialist Direct Number: [phone] Fax: [phone]

## 2022-07-07 NOTE — Telephone Encounter (Addendum)
Seen by patient Kevin Long on 07/07/2022  9:06 AM; orders placed and labs mailed to patient, follow up mychart message sent to patient.     ----- Message from Alver Sorrow, NP sent at 07/06/2022  6:04 PM EDT ----- Total cholesterol and LDL (bad cholesterol) are elevated. Recommend start Repatha as recommended by Belenda Cruise, [facility] at visit 06/17/22. Will repeat labs after 3 doses of Repatha pushtronex.

## 2022-07-07 NOTE — Telephone Encounter (Signed)
Pharmacy Patient Advocate Encounter  Prior Authorization for REPATHA has been approved.    Effective dates: 07/07/22 through 07/07/23  Haze Rushing, CPhT Pharmacy Patient Advocate Specialist Direct Number: [phone] Fax: [phone]

## 2022-08-01 ENCOUNTER — Telehealth: Payer: Self-pay | Admitting: Internal Medicine

## 2022-08-01 NOTE — Telephone Encounter (Signed)
Verlon Au with [employer] is calling to reach out to patient concern dm supplies. Verlon Au has tried to called pt around 6 times no success. I called pt and gave him leslie contact info

## 2022-08-13 ENCOUNTER — Telehealth: Payer: Self-pay | Admitting: Internal Medicine

## 2022-08-13 ENCOUNTER — Other Ambulatory Visit: Payer: Self-pay | Admitting: Internal Medicine

## 2022-08-13 DIAGNOSIS — E1169 Type 2 diabetes mellitus with other specified complication: Secondary | ICD-10-CM

## 2022-08-13 NOTE — Telephone Encounter (Signed)
Diagnose code needed that coincides with prescription for Semaglutide, 2 MG/DOSE, 8 MG/3ML SOPN

## 2022-08-14 NOTE — Telephone Encounter (Signed)
PA has been approved though 08/14/23

## 2022-08-18 ENCOUNTER — Ambulatory Visit (HOSPITAL_BASED_OUTPATIENT_CLINIC_OR_DEPARTMENT_OTHER): Payer: Medicaid Other | Admitting: Family

## 2022-08-21 ENCOUNTER — Telehealth: Payer: Self-pay | Admitting: Internal Medicine

## 2022-08-21 NOTE — Telephone Encounter (Signed)
Verlon Au with [employer] [phone]  Called to say they cannot seem to reach the patient. Pt's contact numbers were verified.  She is asking if we could try to reach him and have him call: Intake Dept: [phone]

## 2022-08-21 NOTE — Telephone Encounter (Signed)
Pt said he get his dm supplies from [facility]

## 2022-08-21 NOTE — Telephone Encounter (Signed)
Left message on machine for patient to return our call.  Message sent via MyChart.

## 2022-08-22 NOTE — Telephone Encounter (Signed)
Pt said he does not use [employer]

## 2022-08-31 ENCOUNTER — Other Ambulatory Visit (HOSPITAL_COMMUNITY): Payer: Self-pay | Admitting: Cardiology

## 2022-09-26 ENCOUNTER — Other Ambulatory Visit: Payer: Self-pay | Admitting: Internal Medicine

## 2022-09-26 ENCOUNTER — Telehealth: Payer: Self-pay | Admitting: Internal Medicine

## 2022-09-26 NOTE — Telephone Encounter (Signed)
Pt currently receiving Continuous Blood Gluc Sensor (FREESTYLE LIBRE 2 SENSOR) MISC, would like to change to [facility] 3 sensor.   [id number] - [place] ([place]), [place] - [address] Phone: [phone]  Fax: [phone]

## 2022-10-01 MED ORDER — FREESTYLE LIBRE 3 SENSOR MISC: 2 refills | Status: DC

## 2022-10-01 NOTE — Addendum Note (Signed)
Addended by: Waymon Amato R on: 10/01/2022 11:47 AM   Modules accepted: Orders

## 2022-10-01 NOTE — Telephone Encounter (Signed)
Rx changed to [facility] 3 sensor

## 2022-10-06 ENCOUNTER — Ambulatory Visit: Payer: Medicaid Other | Admitting: Internal Medicine

## 2022-10-06 ENCOUNTER — Encounter: Payer: Self-pay | Admitting: Internal Medicine

## 2022-10-06 VITALS — BP 118/82 | HR 82 | Temp 98.0°F

## 2022-10-06 DIAGNOSIS — I5042 Chronic combined systolic (congestive) and diastolic (congestive) heart failure: Secondary | ICD-10-CM

## 2022-10-06 DIAGNOSIS — I10 Essential (primary) hypertension: Secondary | ICD-10-CM | POA: Diagnosis not present

## 2022-10-06 DIAGNOSIS — N182 Chronic kidney disease, stage 2 (mild): Secondary | ICD-10-CM | POA: Diagnosis not present

## 2022-10-06 DIAGNOSIS — E782 Mixed hyperlipidemia: Secondary | ICD-10-CM

## 2022-10-06 DIAGNOSIS — Z7985 Long-term (current) use of injectable non-insulin antidiabetic drugs: Secondary | ICD-10-CM

## 2022-10-06 DIAGNOSIS — E1169 Type 2 diabetes mellitus with other specified complication: Secondary | ICD-10-CM

## 2022-10-06 LAB — POCT GLYCOSYLATED HEMOGLOBIN (HGB A1C): Hemoglobin A1C: 7.3 % — AB (ref 4.0–5.6)

## 2022-10-06 NOTE — Assessment & Plan Note (Signed)
Baseline creatinine remains around 1.3-1.4.

## 2022-10-06 NOTE — Assessment & Plan Note (Signed)
Has not tolerated any statins.  Has not started evolocumab that was prescribed by cardiology.  Have advised that he start taking and discuss with cardiology.

## 2022-10-06 NOTE — Assessment & Plan Note (Signed)
Volume status is compensated, no chest pain, followed by cardiology.

## 2022-10-06 NOTE — Progress Notes (Signed)
Established Patient Office Visit     CC/Reason for Visit: Follow-up chronic medical conditions  HPI: Kevin Long is a 62 y.o. male who is coming in today for the above mentioned reasons. Past Medical History is significant for: Hypertension, hyperlipidemia, type 2 diabetes, morbid obesity, chronic combined CHF.  At last visit his Ozempic was increased from 1 to 2 mg and he has been compliant with this.  He is overdue for diabetic eye exam.  He never started evolocumab that was prescribed by cardiology.   Past Medical/Surgical History: Past Medical History:  Diagnosis Date   Abnormal LFTs    Aortic atherosclerosis (HCC) 05/31/2021   Asthma    Bacteremia 09/19/2020   Chest pain of uncertain etiology 05/31/2021   Degeneration of lumbosacral intervertebral disc 09/19/2020   Diabetes mellitus, type 2 (HCC)    Essential hypertension 02/09/2007   Qualifier: Diagnosis of  By: Yetta Barre CNA/MA, Jessica     Gout 09/19/2020   HFrEF (heart failure with reduced ejection fraction) (HCC) 09/25/2016   Echocardiogram 09/22/20: EF < 20, global HK, Gr 2 DD, elevated LA pressure, D shaped LV c/w RV vol overload, mod reduced RVSF, mild BAE, trivial MR, severe TR   History of COVID-19    Admit in 07/2019 - course c/b pulmonary embolism, pneumonia, R lung mass (?infectious), MSSA bacteremia, a/c CHF, metabolic encephalopathy, AKI   Hx of Pulmonary Embolism 08/12/2019   In setting of COVID-19 pneumonia with post obstructive pneumonia (pt left hosp AMA)   Hypercholesterolemia    Mass of upper lobe of right lung 08/12/2019   during admit for COVID-19 pneumonia   Morbid obesity (HCC) 04/21/2013   NICM (nonischemic cardiomyopathy) (HCC) 08/13/2015   Normal Cath in 2014   Obesity    OSA (obstructive sleep apnea)    Pain in joint involving pelvic region and thigh 09/19/2020   Pain in joint, lower leg 09/19/2020   Palpitations 08/13/2015   PVC's (premature ventricular contractions) 03/28/2016   Noted on  prior monitor but not quantified   Tobacco use disorder 09/19/2020   Vertigo 09/25/2016    Past Surgical History:  Procedure Laterality Date   FOOT SURGERY     has pin and screws   LEFT AND RIGHT HEART CATHETERIZATION WITH CORONARY ANGIOGRAM N/A 08/04/2012   Procedure: LEFT AND RIGHT HEART CATHETERIZATION WITH CORONARY ANGIOGRAM;  Surgeon: Corky Crafts, MD;  Location: [facility] CATH LAB;  Service: Cardiovascular;  Laterality: N/A;    Social History:  reports that he quit smoking about 17 years ago. His smoking use included cigarettes. He has a 25.00 pack-year smoking history. He has never used smokeless tobacco. He reports that he does not drink alcohol and does not use drugs.  Allergies: Allergies  Allergen Reactions   Celebrex [Celecoxib] Swelling    Swelling in face   Rosuvastatin Hives   Entresto [Sacubitril-Valsartan] Other (See Comments)    Dizziness    Family History:  Family History  Problem Relation Age of Onset   Diabetes Mother    Anxiety disorder Brother    Depression Son    Heart murmur Son    Heart attack Neg Hx    Hypertension Neg Hx    Stroke Neg Hx      Current Outpatient Medications:    albuterol (PROVENTIL) (2.5 MG/3ML) 0.083% nebulizer solution, Take 2.5 mg by nebulization every 6 (six) hours as needed for wheezing or shortness of breath., Disp: , Rfl:    albuterol (VENTOLIN HFA) 108 (90 Base)  MCG/ACT inhaler, Inhale 2 puffs into the lungs every 6 (six) hours as needed for wheezing or shortness of breath., Disp: 18 g, Rfl: 2   aspirin EC 81 MG tablet, Take 81 mg by mouth daily. Swallow whole., Disp: , Rfl:    carvedilol (COREG) 6.25 MG tablet, TAKE 1 TABLET BY MOUTH TWICE DAILY WITH A MEAL. APPOINTMENT REQUIRED FOR FUTURE REFILLS, Disp: 90 tablet, Rfl: 0   Continuous Glucose Sensor (FREESTYLE LIBRE 3 SENSOR) MISC, Place 1 sensor on the skin every 14 days. Use to check glucose continuously, Disp: 2 each, Rfl: 2   diclofenac Sodium (VOLTAREN) 1 % GEL,  Apply 2 g topically daily., Disp: 150 g, Rfl: 2   insulin aspart (NOVOLOG FLEXPEN) 100 UNIT/ML FlexPen, 0-20 Units, Subcutaneous, 3 times daily with meals CBG < 70: Implement Hypoglycemia measures CBG 70 - 120: 0 units CBG 121 - 150: 3 units CBG 151 - 200: 4 units CBG 201 - 250: 7 units CBG 251 - 300: 11 units CBG 301 - 350: 15 units CBG 351 - 400: 20 units CBG > 400: call MD, Disp: 15 mL, Rfl: 0   Insulin Pen Needle 31G X 5 MM MISC, 1 each by Does not apply route daily., Disp: 100 each, Rfl: 3   Iron-Vitamins (GERITOL COMPLETE) TABS, Take 1 tablet by mouth daily., Disp: , Rfl:    LINZESS 145 MCG CAPS capsule, TAKE 1 CAPSULE BY MOUTH ONCE DAILY AS NEEDED FOR CONSTIPATION., Disp: 30 capsule, Rfl: 0   Semaglutide, 2 MG/DOSE, 8 MG/3ML SOPN, Inject 2 mg as directed once a week., Disp: 3 mL, Rfl: 2   torsemide (DEMADEX) 20 MG tablet, Take 1 tablet by mouth every other day, Disp: 45 tablet, Rfl: 1  Review of Systems:  Negative unless indicated in HPI.   Physical Exam: Vitals:   10/06/22 1358  BP: 118/82  Pulse: 82  Temp: 98 F (36.7 C)  TempSrc: Oral  SpO2: 98%    There is no height or weight on file to calculate BMI.   Physical Exam Vitals reviewed.  Constitutional:      Appearance: Normal appearance.  HENT:     Head: Normocephalic and atraumatic.  Eyes:     Conjunctiva/sclera: Conjunctivae normal.     Pupils: Pupils are equal, round, and reactive to light.  Cardiovascular:     Rate and Rhythm: Normal rate and regular rhythm.  Pulmonary:     Effort: Pulmonary effort is normal.     Breath sounds: Normal breath sounds.  Skin:    General: Skin is warm and dry.  Neurological:     General: No focal deficit present.     Mental Status: He is alert and oriented to person, place, and time.  Psychiatric:        Mood and Affect: Mood normal.        Behavior: Behavior normal.        Thought Content: Thought content normal.        Judgment: Judgment normal.      Impression and  Plan:  Type 2 diabetes mellitus with other specified complication, without long-term current use of insulin (HCC) -     Microalbumin / creatinine urine ratio; Future -     POCT glycosylated hemoglobin (Hb A1C) -     Ambulatory referral to Ophthalmology  Essential hypertension Assessment & Plan: Well-controlled on current.   Mixed hyperlipidemia Assessment & Plan: Has not tolerated any statins.  Has not started evolocumab that was prescribed by cardiology.  Have advised that he start taking and discuss with cardiology.   Morbid obesity (HCC) Assessment & Plan: -Discussed healthy lifestyle, including increased physical activity and better food choices to promote weight loss.    Chronic combined systolic and diastolic heart failure (HCC) Assessment & Plan: Volume status is compensated, no chest pain, followed by cardiology.   CKD (chronic kidney disease) stage 2, GFR 60-89 ml/min Assessment & Plan: Baseline creatinine remains around 1.3-1.4.      Time spent:32 minutes reviewing chart, interviewing and examining patient and formulating plan of care.     Chaya Jan, MD [facility]

## 2022-10-06 NOTE — Assessment & Plan Note (Signed)
Discussed healthy lifestyle, including increased physical activity and better food choices to promote weight loss.  

## 2022-10-06 NOTE — Assessment & Plan Note (Signed)
Well controlled on current

## 2022-10-14 ENCOUNTER — Other Ambulatory Visit (HOSPITAL_COMMUNITY): Payer: Self-pay | Admitting: Cardiology

## 2022-10-26 ENCOUNTER — Other Ambulatory Visit: Payer: Self-pay | Admitting: Internal Medicine

## 2022-10-27 ENCOUNTER — Other Ambulatory Visit: Payer: Self-pay | Admitting: Internal Medicine

## 2022-11-10 ENCOUNTER — Other Ambulatory Visit: Payer: Self-pay | Admitting: Internal Medicine

## 2022-11-10 DIAGNOSIS — E1169 Type 2 diabetes mellitus with other specified complication: Secondary | ICD-10-CM

## 2022-12-02 ENCOUNTER — Ambulatory Visit (HOSPITAL_BASED_OUTPATIENT_CLINIC_OR_DEPARTMENT_OTHER): Payer: Medicaid Other | Admitting: Cardiovascular Disease

## 2022-12-02 ENCOUNTER — Encounter (HOSPITAL_BASED_OUTPATIENT_CLINIC_OR_DEPARTMENT_OTHER): Payer: Self-pay | Admitting: Cardiovascular Disease

## 2022-12-02 VITALS — BP 131/75 | HR 71 | Ht 66.0 in | Wt 239.0 lb

## 2022-12-02 DIAGNOSIS — E7800 Pure hypercholesterolemia, unspecified: Secondary | ICD-10-CM

## 2022-12-02 DIAGNOSIS — I10 Essential (primary) hypertension: Secondary | ICD-10-CM

## 2022-12-02 DIAGNOSIS — G4733 Obstructive sleep apnea (adult) (pediatric): Secondary | ICD-10-CM | POA: Diagnosis not present

## 2022-12-02 DIAGNOSIS — I700 Atherosclerosis of aorta: Secondary | ICD-10-CM

## 2022-12-02 DIAGNOSIS — I493 Ventricular premature depolarization: Secondary | ICD-10-CM

## 2022-12-02 DIAGNOSIS — I5042 Chronic combined systolic (congestive) and diastolic (congestive) heart failure: Secondary | ICD-10-CM | POA: Diagnosis not present

## 2022-12-02 NOTE — Progress Notes (Signed)
Cardiology Office Note:  .    Date:  12/02/2022  ID:  Kevin Long, DOB 03/10/1961, MRN [id number] PCP: Philip Aspen, Limmie Patricia, MD  [facility] Providers Cardiologist:  None     History of Present Illness: .    Necalli Stoup is a 62 y.o. male with a hx of chronic systolic and diastolic heart failure, aortic atherosclerosis, diabetes, hyperlipidemia, OSA, and obesity, here for follow-up. He had a LHC in 2014 which showed normal coronaries.  In 2017, his LVEF was 25-30% but he was not able to get a cardiac MRI due to body size. He intermittently stopped all his medication and struggled with vertigo on Entresto and did not retry it. He had followed-up with the heart failure clinic. In 05/2019 he was infected with COVID, PE and MSSA pneumonia. An Echo 07/2019 revealed LVEF <20% with severe RV dysfunction and an LV thrombus. He was not followed for a year but was admitted 08/2020 with heart failure symptoms. At the time he re-established care. An Echo 12/2020 showed improved LVEF 35-40% and no evidence of LV thrombus. He was last seen 12/2020 and stopped spironolactone because he felt it was causing nose bleeds. Carvedilol was increased to 6.25 mg, torsemide was increased, and he was asked to start back taking spironolactone. An out patient sleep study was ordered.  He had an exercise Myoview 06/2021 that revealed LVEF 30% with evidence of prior infarct but no ischemia. He achieved 4.6 METS on Bruce protocol.    At his visit 05/2022 he continued to have occasional chest pain that was not thought to be ischemic.  Lipids were uncontrolled and he had not tolerated statins.  He was referred to the pharmacist with plans to start Repatha but he has not yet started it.  Today, he states he is feeling relaxed. He complains of knee pain and has been working on weight loss. About 4 months ago he received a steroidal injection which has not helped much. He is going to gradually increase his exercise. He  recently obtained a treadmill, has been using it about 3 days per week for 5 minutes. No anginal symptoms. Currently he is on Ozempic and tolerating it well. He confirms that he had not started the Repatha; he has been wary of this as he previously felt dizzy and nauseous on prior medications. During his lab work in 05/2022 he was not fasting. In the office his blood pressure is 131/75. He has been out of his carvedilol for about 4 days. He will plan to pick this up today. He denies any palpitations, chest pain, shortness of breath, peripheral edema, lightheadedness, headaches, syncope, orthopnea, or PND.  ROS:  Please see the history of present illness. All other systems are reviewed and negative.  (+) Knee pain  Studies Reviewed: .       Echo 01/01/2021:  1. Left ventricular ejection fraction, by estimation, is 35 to 40%. Left  ventricular ejection fraction by 2D MOD biplane is 39.5 %. The left  ventricle has moderately decreased function. The left ventricle  demonstrates global hypokinesis. The left  ventricular internal cavity size was mildly dilated. Left ventricular  diastolic parameters are consistent with Grade II diastolic dysfunction  (pseudonormalization). Elevated left ventricular end-diastolic pressure.  The E/e' is 20.   2. Right ventricular systolic function is mildly reduced. The right  ventricular size is normal. There is normal pulmonary artery systolic  pressure. The estimated right ventricular systolic pressure is 35.3 mmHg.   3. The  mitral valve is abnormal. Mild mitral valve regurgitation.   4. The aortic valve is tricuspid. Aortic valve regurgitation is not  visualized.   5. The inferior vena cava is normal in size with greater than 50%  respiratory variability, suggesting right atrial pressure of 3 mmHg.   Risk Assessment/Calculations:             Physical Exam:    VS:  BP 131/75   Pulse 71   Ht 5\' 6"  (1.676 m)   Wt 239 lb (108.4 kg)   BMI 38.58 kg/m  , BMI  Body mass index is 38.58 kg/m. GENERAL:  Well appearing HEENT: Pupils equal round and reactive, fundi not visualized, oral mucosa unremarkable NECK:  No jugular venous distention, waveform within normal limits, carotid upstroke brisk and symmetric, no bruits, no thyromegaly LUNGS:  Clear to auscultation bilaterally HEART:  RRR.  PMI not displaced or sustained,S1 and S2 within normal limits, no S3, no S4, no clicks, no rubs, no murmurs ABD:  Flat, positive bowel sounds normal in frequency in pitch, no bruits, no rebound, no guarding, no midline pulsatile mass, no hepatomegaly, no splenomegaly EXT:  2 plus pulses throughout, no edema, no cyanosis no clubbing SKIN:  No rashes no nodules NEURO:  Cranial nerves II through XII grossly intact, motor grossly intact throughout PSYCH:  Cognitively intact, oriented to person place and time  Wt Readings from Last 3 Encounters:  12/02/22 239 lb (108.4 kg)  07/02/22 238 lb 14.4 oz (108.4 kg)  06/17/22 239 lb 6.7 oz (108.6 kg)     ASSESSMENT AND PLAN: .    # Chronic systolic and diastolic HF:  LVEF improved from <20% to 35-40%.  He is euvolemic and doing well.  BP well-controlled.  He has been out of carvedilol for a few days but will pick it up today.  Continue carvedilol and torsemide.  He has not tolerated Entresto due to dizziness.  Repeat echo at follow up.   # Aortic atherosclerosis:  # Hyperlipidemia High LDL levels noted in previous labs. Patient has had adverse reactions to statins and Repatha. Discussed potential use of Inclisiran. -Order fasting lipid panel and comprehensive metabolic panel to reassess current levels. -Consider Inclisiran pending lab results and consultation with lipid specialist.  LDL goal <70 given aortic atherosclerosis.   # Hypertension Patient ran out of Carvedilol for four days. -Advised patient to resume Carvedilol as prescribed. -Encouraged patient to maintain medication adherence.  General Health  Maintenance -Plan to follow up in one year, with interim contact to discuss cholesterol lab results.      Dispo:  FU with Varnell Orvis C. Duke Salvia, MD, [facility] in 1 year.  I,Mathew Stumpf,acting as a Neurosurgeon for Chilton Si, MD.,have documented all relevant documentation on the behalf of Chilton Si, MD,as directed by  Chilton Si, MD while in the presence of Chilton Si, MD.  I, Jalesa Thien C. Duke Salvia, MD have reviewed all documentation for this visit.  The documentation of the exam, diagnosis, procedures, and orders on 12/02/2022 are all accurate and complete.   Signed, Chilton Si, MD

## 2022-12-02 NOTE — Patient Instructions (Signed)
Medication Instructions:  Your physician recommends that you continue on your current medications as directed. Please refer to the Current Medication list given to you today.  *If you need a refill on your cardiac medications before your next appointment, please call your pharmacy*   Lab Work: Your physician recommends that you return for lab work within the next week or so for fasting lipid and CMP  Follow-Up: At [facility], you and your health needs are our priority.  As part of our continuing mission to provide you with exceptional heart care, we have created designated Provider Care Teams.  These Care Teams include your primary Cardiologist (physician) and Advanced Practice Providers (APPs -  Physician Assistants and Nurse Practitioners) who all work together to provide you with the care you need, when you need it.  We recommend signing up for the patient portal called "MyChart".  Sign up information is provided on this After Visit Summary.  MyChart is used to connect with patients for Virtual Visits (Telemedicine).  Patients are able to view lab/test results, encounter notes, upcoming appointments, etc.  Non-urgent messages can be sent to your provider as well.   To learn more about what you can do with MyChart, go to [email or web address].    Your next appointment:   1 year follow up with Dr. Duke Salvia

## 2022-12-02 NOTE — Progress Notes (Deleted)
  Cardiology Office Note:  .   Date:  12/02/2022  ID:  Kevin Long, DOB 1961-01-17, MRN [id number] PCP: Philip Aspen, Limmie Patricia, MD  [facility] Providers Cardiologist:  None { Click to update primary MD,subspecialty MD or APP then REFRESH:1}   History of Present Illness: .   Kevin Long is a 62 y.o. male with a hx of chronic systolic and diastolic heart failure, aortic atherosclerosis, diabetes, hyperlipidemia, OSA, and obesity here for follow-up. He had a LHC in 2014 which showed normal coronaries.  In 2017, his LVEF was 25-30% but he was not able to get a cardiac MRI due to body size. He intermittently stopped all his medication and struggled with vertigo on Entresto and did not retry it. He had followed-up with the heart failure clinic. In 05/2019 he was infected with COVID, PE and MSSA pneumonia. An Echo 07/2019 revealed LVEF <20% with severe RV dysfunction and an LV thrombus. He was not followed for a year but was admitted 08/2020 with heart failure symptoms. At the time he re-established care. An Echo 12/2020 show improved  LVEF 35-40% and no evidence of LV thrombus. He was last seen 12/2020 and stopped spironolactone because he felt it was causing nose bleeds. Carvedilol was increase to 6.25 mg, torsemide was increased, and he was asked to start back taking spironolactone. An out patient sleep study was ordered.  He had an exercise Myoview 06/2021 that revealed LVEF 30%with evidence of prior infarct but no ischemia. He achieved 4.6 METS on Bruce protocol.   At his visit 05/2022 he continued to have occasional chest pain that was not thought to be ischemic.  Lipids were uncontrolled and he had not tolerated statins.  He was referred to the pharmacist with plans to start Repatha but he has not yet started it.  ROS: ***  Studies Reviewed: .        *** Risk Assessment/Calculations:   {Does this patient have ATRIAL FIBRILLATION?:[phone]} No BP recorded.  {Refresh Note OR Click here  to enter BP  :1}***       Physical Exam:   VS:  There were no vitals taken for this visit.   Wt Readings from Last 3 Encounters:  07/02/22 238 lb 14.4 oz (108.4 kg)  06/17/22 239 lb 6.7 oz (108.6 kg)  05/20/22 240 lb 14.4 oz (109.3 kg)    GEN: Well nourished, well developed in no acute distress NECK: No JVD; No carotid bruits CARDIAC: ***RRR, no murmurs, rubs, gallops RESPIRATORY:  Clear to auscultation without rales, wheezing or rhonchi  ABDOMEN: Soft, non-tender, non-distended EXTREMITIES:  No edema; No deformity   ASSESSMENT AND PLAN: .   ***    {Are you ordering a CV Procedure (e.g. stress test, cath, DCCV, TEE, etc)?   Press F2        :[id number]}  Dispo: ***  Signed, Chilton Si, MD

## 2022-12-09 ENCOUNTER — Other Ambulatory Visit: Payer: Self-pay | Admitting: Internal Medicine

## 2022-12-09 DIAGNOSIS — E1169 Type 2 diabetes mellitus with other specified complication: Secondary | ICD-10-CM

## 2022-12-26 ENCOUNTER — Other Ambulatory Visit (HOSPITAL_COMMUNITY): Payer: Self-pay | Admitting: Cardiology

## 2023-01-02 ENCOUNTER — Other Ambulatory Visit: Payer: Self-pay | Admitting: Internal Medicine

## 2023-01-02 DIAGNOSIS — Z1211 Encounter for screening for malignant neoplasm of colon: Secondary | ICD-10-CM

## 2023-01-02 DIAGNOSIS — Z1212 Encounter for screening for malignant neoplasm of rectum: Secondary | ICD-10-CM

## 2023-01-07 ENCOUNTER — Other Ambulatory Visit: Payer: Self-pay | Admitting: Internal Medicine

## 2023-01-07 DIAGNOSIS — E1169 Type 2 diabetes mellitus with other specified complication: Secondary | ICD-10-CM

## 2023-01-14 ENCOUNTER — Encounter: Payer: Self-pay | Admitting: Internal Medicine

## 2023-01-14 ENCOUNTER — Ambulatory Visit: Payer: Medicaid Other | Admitting: Internal Medicine

## 2023-01-14 VITALS — BP 110/80 | HR 80 | Temp 98.0°F | Ht 66.0 in | Wt 233.0 lb

## 2023-01-14 DIAGNOSIS — E1169 Type 2 diabetes mellitus with other specified complication: Secondary | ICD-10-CM

## 2023-01-14 DIAGNOSIS — Z1159 Encounter for screening for other viral diseases: Secondary | ICD-10-CM

## 2023-01-14 DIAGNOSIS — Z7985 Long-term (current) use of injectable non-insulin antidiabetic drugs: Secondary | ICD-10-CM

## 2023-01-14 DIAGNOSIS — I10 Essential (primary) hypertension: Secondary | ICD-10-CM

## 2023-01-14 DIAGNOSIS — N183 Chronic kidney disease, stage 3 unspecified: Secondary | ICD-10-CM | POA: Diagnosis not present

## 2023-01-14 DIAGNOSIS — E782 Mixed hyperlipidemia: Secondary | ICD-10-CM | POA: Diagnosis not present

## 2023-01-14 DIAGNOSIS — Z0000 Encounter for general adult medical examination without abnormal findings: Secondary | ICD-10-CM | POA: Diagnosis not present

## 2023-01-14 DIAGNOSIS — E1122 Type 2 diabetes mellitus with diabetic chronic kidney disease: Secondary | ICD-10-CM | POA: Diagnosis not present

## 2023-01-14 LAB — LIPID PANEL
Cholesterol: 273 mg/dL — ABNORMAL HIGH (ref 0–200)
HDL: 70.8 mg/dL (ref >39.00)
LDL Cholesterol: 178 mg/dL — ABNORMAL HIGH (ref 0–99)
NonHDL: 201.86
Total CHOL/HDL Ratio: 4
Triglycerides: 118 mg/dL (ref 0.0–149.0)
VLDL: 23.6 mg/dL (ref 0.0–40.0)

## 2023-01-14 LAB — CBC WITH DIFFERENTIAL/PLATELET
Basophils Absolute: 0.1 10*3/uL (ref 0.0–0.1)
Basophils Relative: 1.3 % (ref 0.0–3.0)
Eosinophils Absolute: 0.5 10*3/uL (ref 0.0–0.7)
Eosinophils Relative: 5.9 % — ABNORMAL HIGH (ref 0.0–5.0)
HCT: 45.1 % (ref 39.0–52.0)
Hemoglobin: 14.9 g/dL (ref 13.0–17.0)
Lymphocytes Relative: 26.2 % (ref 12.0–46.0)
Lymphs Abs: 2.3 10*3/uL (ref 0.7–4.0)
MCHC: 32.9 g/dL (ref 30.0–36.0)
MCV: 87.3 fL (ref 78.0–100.0)
Monocytes Absolute: 0.7 10*3/uL (ref 0.1–1.0)
Monocytes Relative: 8.3 % (ref 3.0–12.0)
Neutro Abs: 5 10*3/uL (ref 1.4–7.7)
Neutrophils Relative %: 58.3 % (ref 43.0–77.0)
Platelets: 329 10*3/uL (ref 150.0–400.0)
RBC: 5.17 Mil/uL (ref 4.22–5.81)
RDW: 14.7 % (ref 11.5–15.5)
WBC: 8.6 10*3/uL (ref 4.0–10.5)

## 2023-01-14 LAB — COMPREHENSIVE METABOLIC PANEL
ALT: 9 U/L (ref 0–53)
AST: 14 U/L (ref 0–37)
Albumin: 4.2 g/dL (ref 3.5–5.2)
Alkaline Phosphatase: 79 U/L (ref 39–117)
BUN: 9 mg/dL (ref 6–23)
CO2: 28 meq/L (ref 19–32)
Calcium: 9.6 mg/dL (ref 8.4–10.5)
Chloride: 102 meq/L (ref 96–112)
Creatinine, Ser: 1.15 mg/dL (ref 0.40–1.50)
GFR: 67.44 mL/min (ref >60.00)
Glucose, Bld: 134 mg/dL — ABNORMAL HIGH (ref 70–99)
Potassium: 4.1 meq/L (ref 3.5–5.1)
Sodium: 139 meq/L (ref 135–145)
Total Bilirubin: 1 mg/dL (ref 0.2–1.2)
Total Protein: 6.9 g/dL (ref 6.0–8.3)

## 2023-01-14 LAB — TSH: TSH: 2.18 u[IU]/mL (ref 0.35–5.50)

## 2023-01-14 LAB — MICROALBUMIN / CREATININE URINE RATIO
Creatinine,U: 335.5 mg/dL
Microalb Creat Ratio: 0.5 mg/g (ref 0.0–30.0)
Microalb, Ur: 1.8 mg/dL (ref 0.0–1.9)

## 2023-01-14 LAB — VITAMIN B12: Vitamin B-12: 179 pg/mL — ABNORMAL LOW (ref 211–911)

## 2023-01-14 LAB — PSA: PSA: 1.73 ng/mL (ref 0.10–4.00)

## 2023-01-14 LAB — VITAMIN D 25 HYDROXY (VIT D DEFICIENCY, FRACTURES): VITD: 18.66 ng/mL — ABNORMAL LOW (ref 30.00–100.00)

## 2023-01-14 LAB — HEMOGLOBIN A1C: Hgb A1c MFr Bld: 7.3 % — ABNORMAL HIGH (ref 4.6–6.5)

## 2023-01-14 NOTE — Progress Notes (Signed)
Established Patient Office Visit     CC/Reason for Visit: Annual preventive exam  HPI: Kevin Long is a 62 y.o. male who is coming in today for the above mentioned reasons. Past Medical History is significant for: Hypertension, hyperlipidemia, type 2 diabetes, morbid obesity, chronic combined CHF.  Perhaps his most important comorbidity is his nonadherence to conventional medical therapy.  He declines all vaccines and age-appropriate cancer screening.  He will take medications sporadically, has not yet started Repatha or inclisiran that has been recommended for him.   Past Medical/Surgical History: Past Medical History:  Diagnosis Date   Abnormal LFTs    Aortic atherosclerosis (HCC) 05/31/2021   Asthma    Bacteremia 09/19/2020   Chest pain of uncertain etiology 05/31/2021   Degeneration of lumbosacral intervertebral disc 09/19/2020   Diabetes mellitus, type 2 (HCC)    Essential hypertension 02/09/2007   Qualifier: Diagnosis of  By: Yetta Barre CNA/MA, Jessica     Gout 09/19/2020   HFrEF (heart failure with reduced ejection fraction) (HCC) 09/25/2016   Echocardiogram 09/22/20: EF < 20, global HK, Gr 2 DD, elevated LA pressure, D shaped LV c/w RV vol overload, mod reduced RVSF, mild BAE, trivial MR, severe TR   History of COVID-19    Admit in 07/2019 - course c/b pulmonary embolism, pneumonia, R lung mass (?infectious), MSSA bacteremia, a/c CHF, metabolic encephalopathy, AKI   Hx of Pulmonary Embolism 08/12/2019   In setting of COVID-19 pneumonia with post obstructive pneumonia (pt left hosp AMA)   Hypercholesterolemia    Mass of upper lobe of right lung 08/12/2019   during admit for COVID-19 pneumonia   Morbid obesity (HCC) 04/21/2013   NICM (nonischemic cardiomyopathy) (HCC) 08/13/2015   Normal Cath in 2014   Obesity    OSA (obstructive sleep apnea)    Pain in joint involving pelvic region and thigh 09/19/2020   Pain in joint, lower leg 09/19/2020   Palpitations 08/13/2015    PVC's (premature ventricular contractions) 03/28/2016   Noted on prior monitor but not quantified   Tobacco use disorder 09/19/2020   Vertigo 09/25/2016    Past Surgical History:  Procedure Laterality Date   FOOT SURGERY     has pin and screws   LEFT AND RIGHT HEART CATHETERIZATION WITH CORONARY ANGIOGRAM N/A 08/04/2012   Procedure: LEFT AND RIGHT HEART CATHETERIZATION WITH CORONARY ANGIOGRAM;  Surgeon: Corky Crafts, MD;  Location: [facility] CATH LAB;  Service: Cardiovascular;  Laterality: N/A;    Social History:  reports that he quit smoking about 17 years ago. His smoking use included cigarettes. He started smoking about 42 years ago. He has a 25 pack-year smoking history. He has never used smokeless tobacco. He reports that he does not drink alcohol and does not use drugs.  Allergies: Allergies  Allergen Reactions   Celebrex [Celecoxib] Swelling    Swelling in face   Rosuvastatin Hives   Entresto [Sacubitril-Valsartan] Other (See Comments)    Dizziness    Family History:  Family History  Problem Relation Age of Onset   Diabetes Mother    Anxiety disorder Brother    Depression Son    Heart murmur Son    Heart attack Neg Hx    Hypertension Neg Hx    Stroke Neg Hx      Current Outpatient Medications:    albuterol (PROVENTIL) (2.5 MG/3ML) 0.083% nebulizer solution, Take 2.5 mg by nebulization every 6 (six) hours as needed for wheezing or shortness of breath., Disp: , Rfl:  albuterol (VENTOLIN HFA) 108 (90 Base) MCG/ACT inhaler, Inhale 2 puffs into the lungs every 6 (six) hours as needed for wheezing or shortness of breath., Disp: 18 g, Rfl: 2   aspirin EC 81 MG tablet, Take 81 mg by mouth daily. Swallow whole., Disp: , Rfl:    carvedilol (COREG) 6.25 MG tablet, TAKE 1 TABLET BY MOUTH TWICE DAILY WITH A MEAL . APPOINTMENT REQUIRED FOR FUTURE REFILLS, Disp: 60 tablet, Rfl: 0   Continuous Glucose Sensor (FREESTYLE LIBRE 3 SENSOR) MISC, Place 1 sensor on the skin every 14  days. Use to check glucose continuously, Disp: 2 each, Rfl: 2   diclofenac Sodium (VOLTAREN) 1 % GEL, Apply 2 g topically daily., Disp: 150 g, Rfl: 2   insulin aspart (NOVOLOG FLEXPEN) 100 UNIT/ML FlexPen, 0-20 Units, Subcutaneous, 3 times daily with meals CBG < 70: Implement Hypoglycemia measures CBG 70 - 120: 0 units CBG 121 - 150: 3 units CBG 151 - 200: 4 units CBG 201 - 250: 7 units CBG 251 - 300: 11 units CBG 301 - 350: 15 units CBG 351 - 400: 20 units CBG > 400: call MD, Disp: 15 mL, Rfl: 0   Insulin Pen Needle 31G X 5 MM MISC, 1 each by Does not apply route daily., Disp: 100 each, Rfl: 3   linaclotide (LINZESS) 145 MCG CAPS capsule, TAKE 1 CAPSULE BY MOUTH ONCE DAILY AS NEEDED FOR CONSTIPATION, Disp: 90 capsule, Rfl: 1   OZEMPIC, 2 MG/DOSE, 8 MG/3ML SOPN, INJECT 2 MG SUBCUTANEOUSLY ONCE A WEEK, Disp: 3 mL, Rfl: 0   torsemide (DEMADEX) 20 MG tablet, Take 1 tablet by mouth every other day, Disp: 45 tablet, Rfl: 1  Review of Systems:  Negative unless indicated in HPI.   Physical Exam: Vitals:   01/14/23 0857  BP: 110/80  Pulse: 80  Temp: 98 F (36.7 C)  TempSrc: Oral  SpO2: 97%  Weight: 233 lb (105.7 kg)  Height: 5\' 6"  (1.676 m)    Body mass index is 37.61 kg/m.   Physical Exam Vitals reviewed.  Constitutional:      General: He is not in acute distress.    Appearance: Normal appearance. He is not ill-appearing, toxic-appearing or diaphoretic.  HENT:     Head: Normocephalic.     Right Ear: Tympanic membrane, ear canal and external ear normal. There is no impacted cerumen.     Left Ear: Tympanic membrane, ear canal and external ear normal. There is no impacted cerumen.     Nose: Nose normal.     Mouth/Throat:     Mouth: Mucous membranes are moist.     Pharynx: Oropharynx is clear. No oropharyngeal exudate or posterior oropharyngeal erythema.  Eyes:     General: No scleral icterus.       Right eye: No discharge.        Left eye: No discharge.     Conjunctiva/sclera:  Conjunctivae normal.     Pupils: Pupils are equal, round, and reactive to light.  Neck:     Vascular: No carotid bruit.  Cardiovascular:     Rate and Rhythm: Normal rate and regular rhythm.     Pulses: Normal pulses.     Heart sounds: Normal heart sounds.  Pulmonary:     Effort: Pulmonary effort is normal. No respiratory distress.     Breath sounds: Normal breath sounds.  Abdominal:     General: Abdomen is flat. Bowel sounds are normal.     Palpations: Abdomen is soft.  Musculoskeletal:  General: Normal range of motion.     Cervical back: Normal range of motion.  Skin:    General: Skin is warm and dry.  Neurological:     General: No focal deficit present.     Mental Status: He is alert and oriented to person, place, and time. Mental status is at baseline.  Psychiatric:        Mood and Affect: Mood normal.        Behavior: Behavior normal.        Thought Content: Thought content normal.        Judgment: Judgment normal.     Flowsheet Row Office Visit from 01/14/2023 in [facility] [place]  PHQ-9 Total Score 3       Impression and Plan:  Encounter for preventive health examination -     PSA; Future  Type 2 diabetes mellitus with other specified complication, without long-term current use of insulin (HCC) -     TSH; Future -     Vitamin B12; Future -     VITAMIN D 25 Hydroxy (Vit-D Deficiency, Fractures); Future -     Hemoglobin A1c; Future -     Microalbumin / creatinine urine ratio; Future  Essential hypertension -     CBC with Differential/Platelet; Future -     Comprehensive metabolic panel; Future  Mixed hyperlipidemia -     Lipid panel; Future  CKD stage 3 due to type 2 diabetes mellitus (HCC)  Encounter for hepatitis C screening test for low risk patient -     Hepatitis C antibody; Future  Morbid obesity (HCC)   -Recommend routine eye and dental care. -Healthy lifestyle discussed in detail. -Labs to be updated  today. -Prostate cancer screening: PSA today Health Maintenance  Topic Date Due   Eye exam for diabetics  Never done   Hepatitis C Screening  Never done   Cologuard (Stool DNA test)  Never done   Yearly kidney function blood test for diabetes  09/27/2022   Yearly kidney health urinalysis for diabetes  09/27/2022   Zoster (Shingles) Vaccine (1 of 2) 04/16/2023*   Flu Shot  06/29/2023*   DTaP/Tdap/Td vaccine (1 - Tdap) 01/14/2024*   Hemoglobin A1C  04/08/2023   Complete foot exam   07/02/2023   HIV Screening  Completed   HPV Vaccine  Aged Out   COVID-19 Vaccine  Discontinued  *Topic was postponed. The date shown is not the original due date.     -Declines all vaccines, declines age-appropriate cancer screening, declines referrals for eye and dental exams.     Chaya Jan, MD [facility]

## 2023-01-15 LAB — HEPATITIS C ANTIBODY: Hepatitis C Ab: NONREACTIVE

## 2023-01-22 ENCOUNTER — Encounter: Payer: Self-pay | Admitting: Internal Medicine

## 2023-01-22 ENCOUNTER — Other Ambulatory Visit: Payer: Self-pay | Admitting: Internal Medicine

## 2023-01-22 ENCOUNTER — Other Ambulatory Visit: Payer: Self-pay | Admitting: *Deleted

## 2023-01-22 DIAGNOSIS — E559 Vitamin D deficiency, unspecified: Secondary | ICD-10-CM | POA: Insufficient documentation

## 2023-01-22 DIAGNOSIS — E538 Deficiency of other specified B group vitamins: Secondary | ICD-10-CM | POA: Insufficient documentation

## 2023-01-22 MED ORDER — BD SAFETYGLIDE SYRINGE/NEEDLE 25G X 1" 3 ML MISC: 3 refills | Status: DC

## 2023-01-22 MED ORDER — CYANOCOBALAMIN 1000 MCG/ML IJ SOLN: INTRAMUSCULAR | 11 refills | Status: DC

## 2023-01-22 MED ORDER — VITAMIN D (ERGOCALCIFEROL) 1.25 MG (50000 UNIT) PO CAPS: 50000.0000 [IU] | ORAL_CAPSULE | ORAL | 0 refills | Status: AC

## 2023-01-24 ENCOUNTER — Other Ambulatory Visit (HOSPITAL_BASED_OUTPATIENT_CLINIC_OR_DEPARTMENT_OTHER): Payer: Self-pay | Admitting: Cardiovascular Disease

## 2023-01-24 ENCOUNTER — Other Ambulatory Visit (HOSPITAL_COMMUNITY): Payer: Self-pay | Admitting: Cardiology

## 2023-01-30 ENCOUNTER — Telehealth: Payer: Self-pay | Admitting: Internal Medicine

## 2023-01-30 NOTE — Telephone Encounter (Signed)
Do not have SYRINGE-NEEDLE, DISP, 3 ML (BD SAFETYGLIDE SYRINGE/NEEDLE) 25G X 1" 3 ML MISC  but they do have 1ml 25 gauge 1 inch length and are suggesting that as a substitution. Says you can call with the response or send in a new script

## 2023-02-02 ENCOUNTER — Other Ambulatory Visit: Payer: Self-pay | Admitting: Internal Medicine

## 2023-02-02 MED ORDER — BD DISP NEEDLE 25G X 1" MISC: 2 refills | Status: AC

## 2023-02-02 NOTE — Telephone Encounter (Signed)
Refill sent.

## 2023-02-02 NOTE — Addendum Note (Signed)
Addended by: Kern Reap B on: 02/02/2023 11:10 AM   Modules accepted: Orders

## 2023-02-05 ENCOUNTER — Other Ambulatory Visit: Payer: Self-pay | Admitting: Internal Medicine

## 2023-02-05 DIAGNOSIS — E1169 Type 2 diabetes mellitus with other specified complication: Secondary | ICD-10-CM

## 2023-02-13 ENCOUNTER — Telehealth: Payer: Self-pay | Admitting: Internal Medicine

## 2023-02-13 NOTE — Telephone Encounter (Signed)
Prescription Request  02/13/2023  LOV: 01/14/2023  What is the name of the medication or equipment? insulin aspart (NOVOLOG FLEXPEN) 100 UNIT/ML FlexPen , Continuous Glucose Sensor (FREESTYLE LIBRE 3 SENSOR) MISC   Have you contacted your pharmacy to request a refill? Yes   Which pharmacy would you like this sent to?  [id number] - [place] ([place]), [place] - [address]  ([place]) [place] [address] Phone: [phone] Fax: [phone]     Patient notified that their request is being sent to the clinical staff for review and that they should receive a response within 2 business days.   Please advise at Mobile [phone] (mobile)

## 2023-02-16 MED ORDER — FREESTYLE LIBRE 3 SENSOR MISC: 2 refills | Status: DC

## 2023-02-16 MED ORDER — NOVOLOG FLEXPEN 100 UNIT/ML ~~LOC~~ SOPN: PEN_INJECTOR | SUBCUTANEOUS | 0 refills | Status: DC

## 2023-02-16 NOTE — Telephone Encounter (Signed)
Refills sent

## 2023-02-23 ENCOUNTER — Other Ambulatory Visit (HOSPITAL_COMMUNITY): Payer: Self-pay | Admitting: Cardiology

## 2023-03-12 ENCOUNTER — Other Ambulatory Visit: Payer: Self-pay | Admitting: Internal Medicine

## 2023-03-12 DIAGNOSIS — E1169 Type 2 diabetes mellitus with other specified complication: Secondary | ICD-10-CM

## 2023-03-26 IMAGING — DX DG CHEST 1V PORT
1 series · 1 of 1 positions shown · non-contrast
Comparison: 09/17/2020 and older studies.

CLINICAL DATA: Shortness of breath and swelling.

EXAM:
PORTABLE CHEST 1 VIEW

[chest ap]
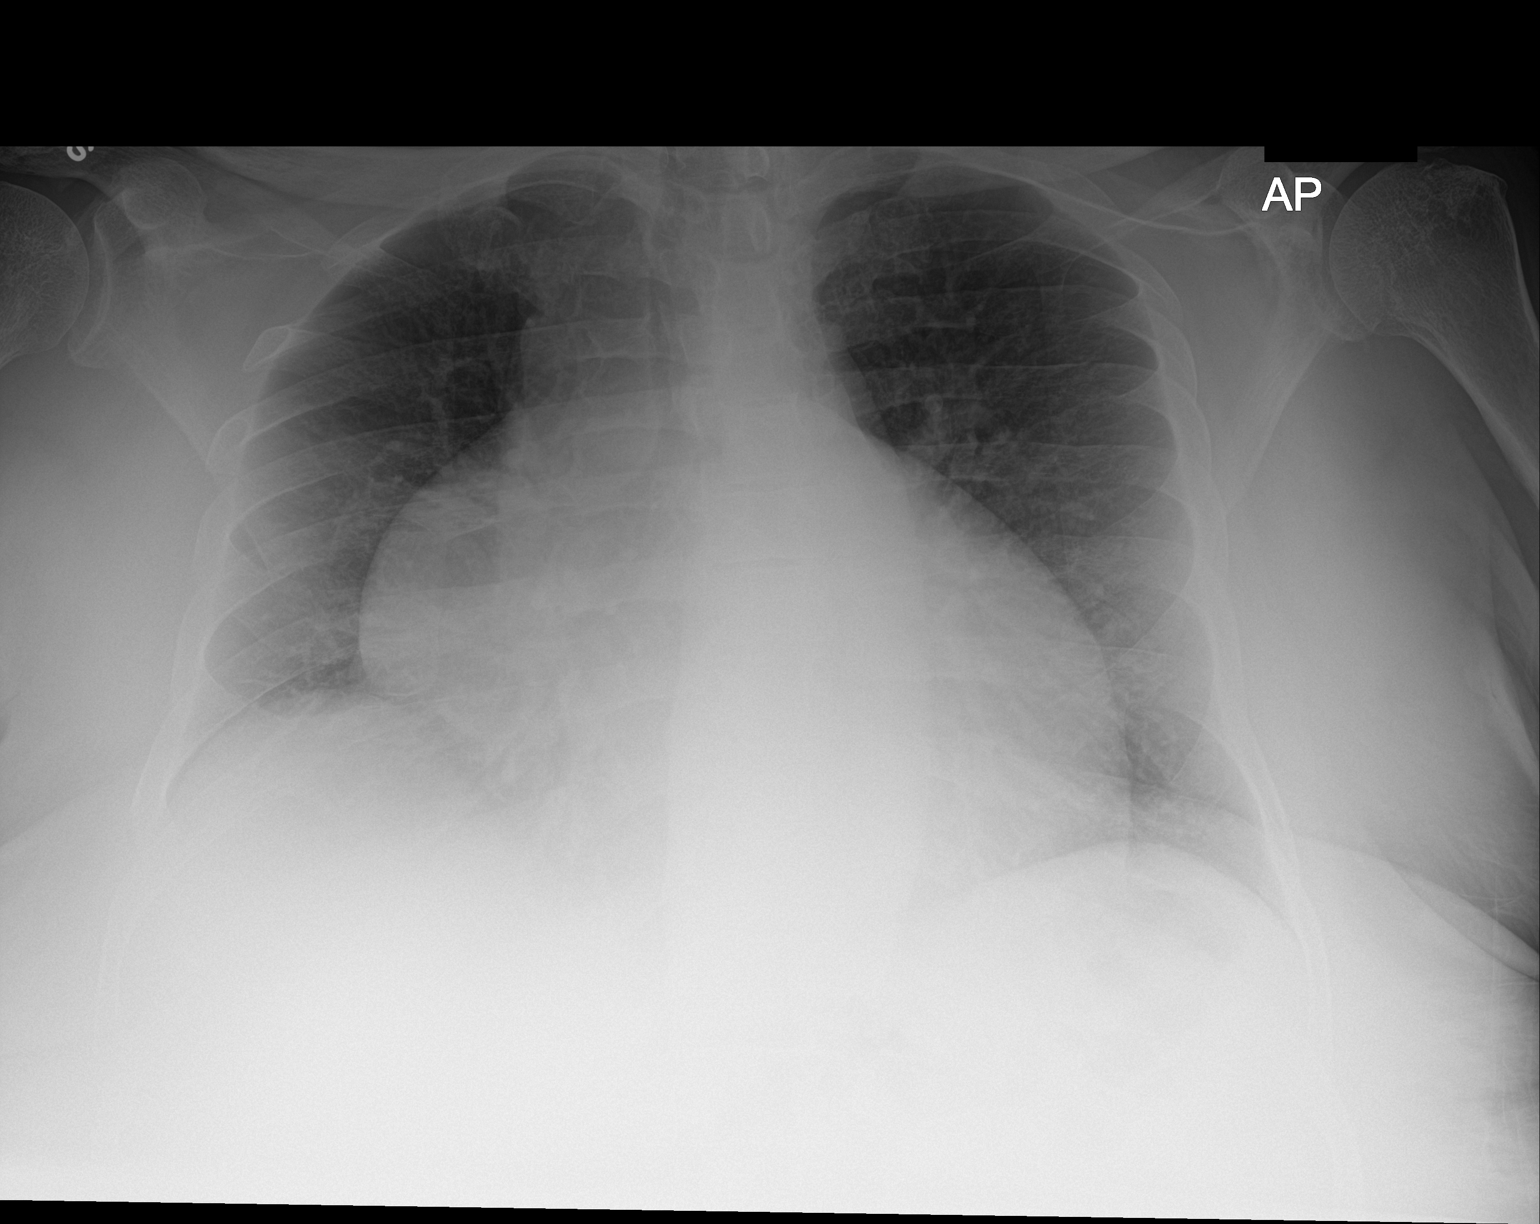

[1 of 1 positions shown; findings below may reference images not displayed]

FINDINGS: Stable mild to moderate. Enlargement of the cardiopericardial
silhouette, no mediastinal or hilar masses.

Clear lungs.  No convincing pleural effusion or pneumothorax.

Skeletal structures are grossly intact.
IMPRESSION: 1. No acute cardiopulmonary disease.  No evidence pulmonary edema.
2. Stable cardiomegaly.

## 2023-03-28 IMAGING — US US ABDOMEN LIMITED
1 series · 14 of 25 positions shown · non-contrast
Comparison: None.

CLINICAL DATA: Elevated bilirubin.

EXAM:
ULTRASOUND ABDOMEN LIMITED RIGHT UPPER QUADRANT

[Series 1: us abdomen limited ruq (liver/gb) · 14 of 36 slices shown]
[im 1/36]
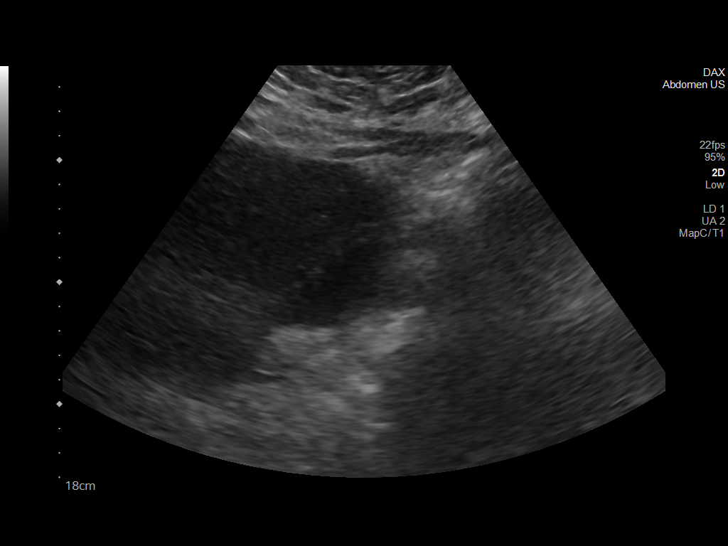
[im 3/36]
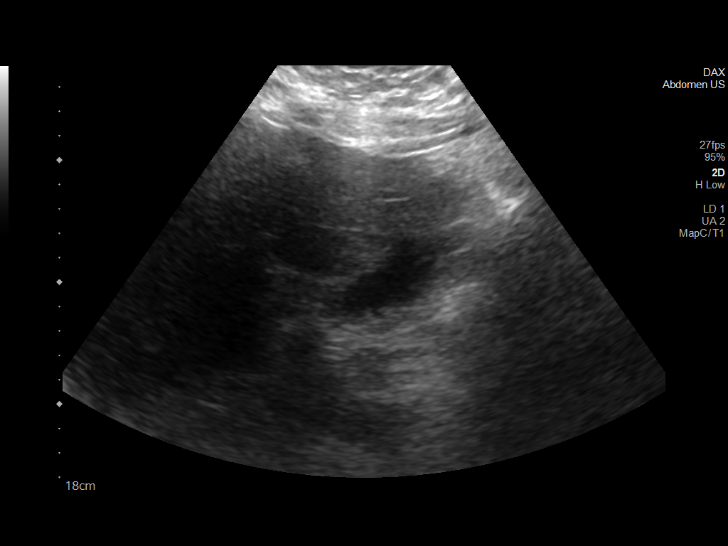
[im 6/36]
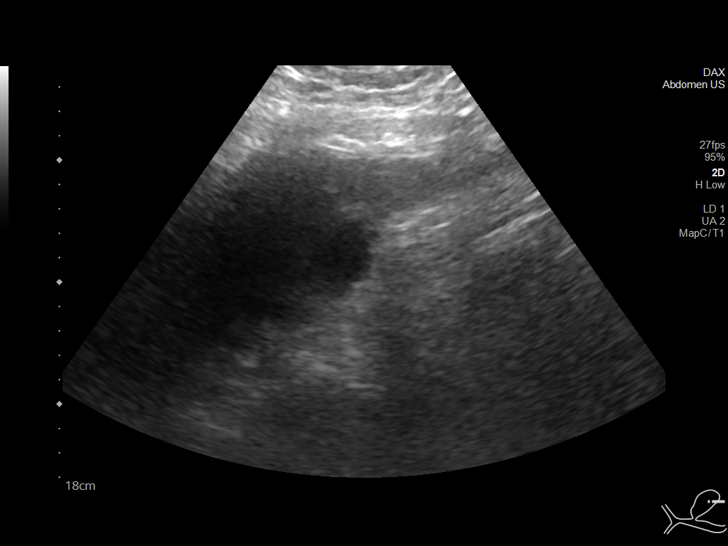
[im 9/36]
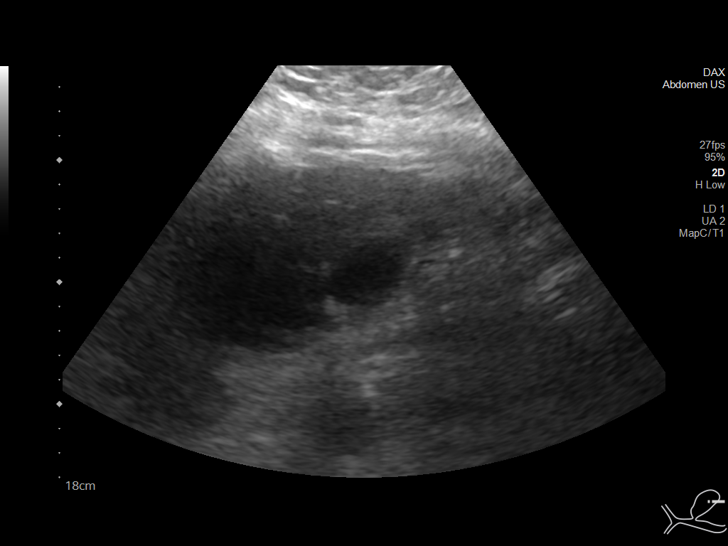
[im 12/36]
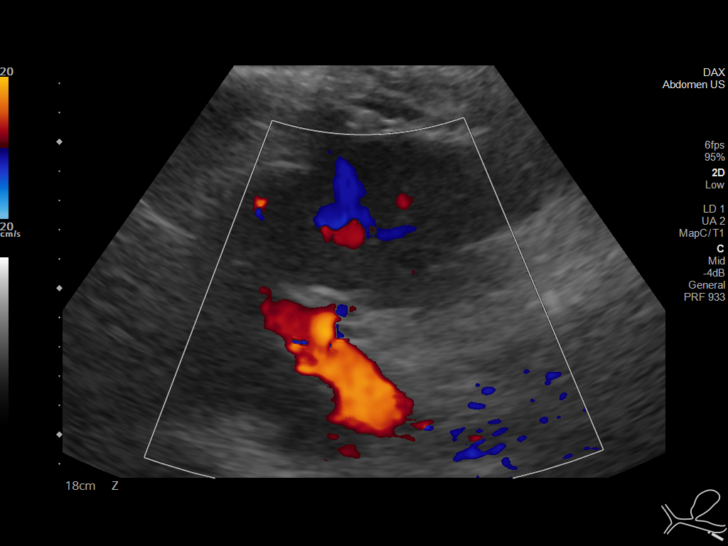
[im 14/36]
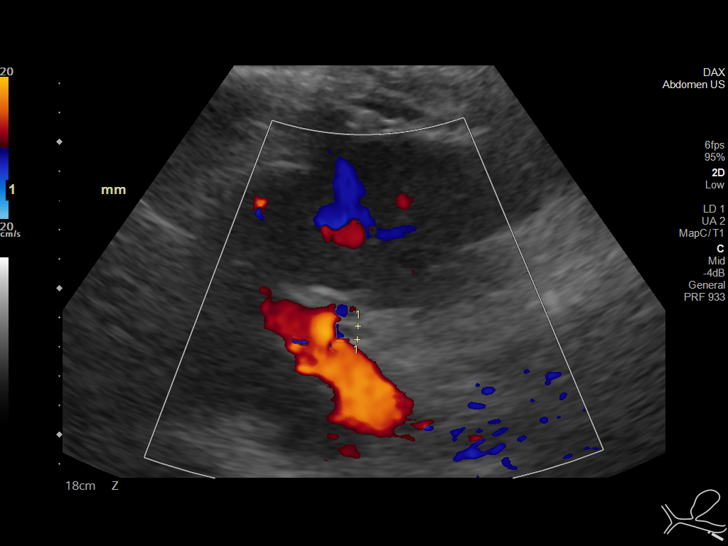
[im 17/36]
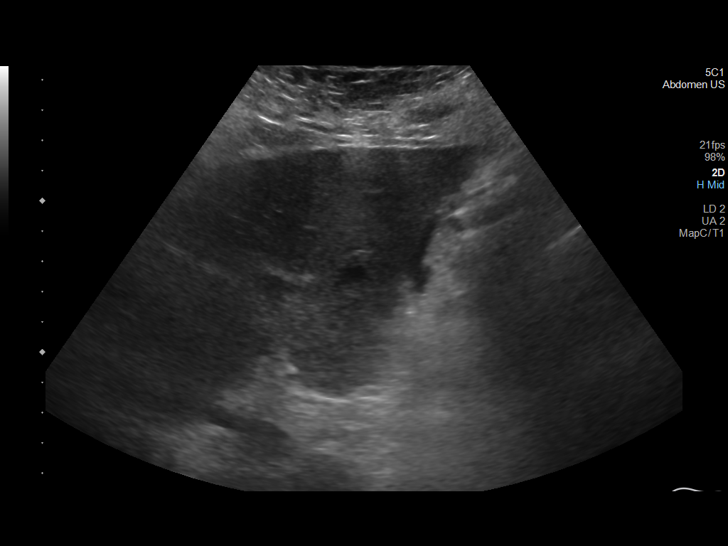
[im 19/36]
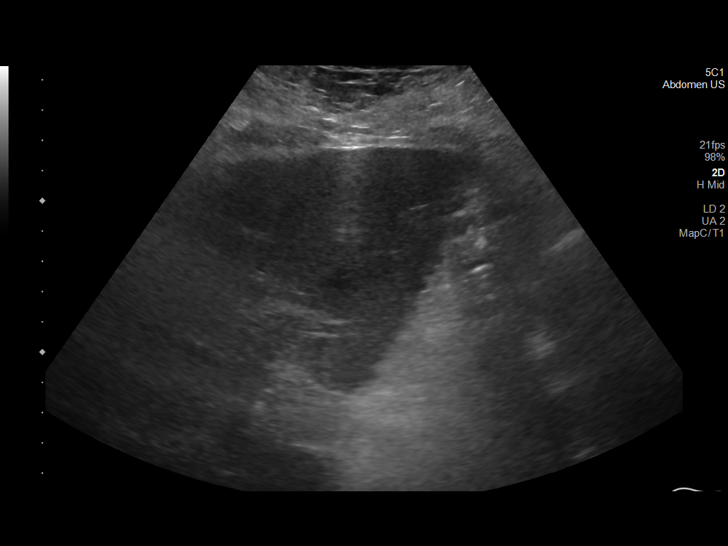
[im 22/36]
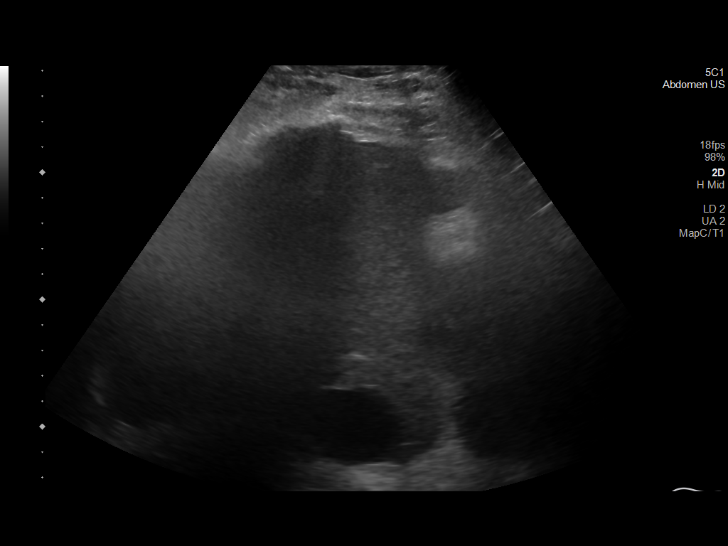
[im 24/36]
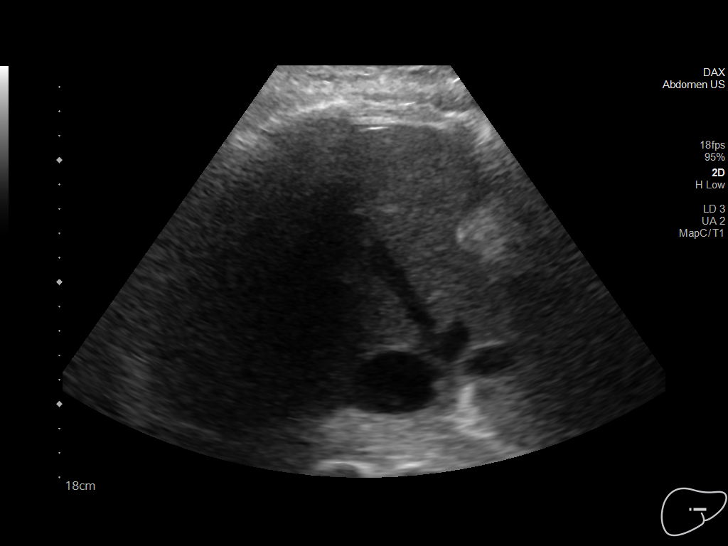
[im 27/36]
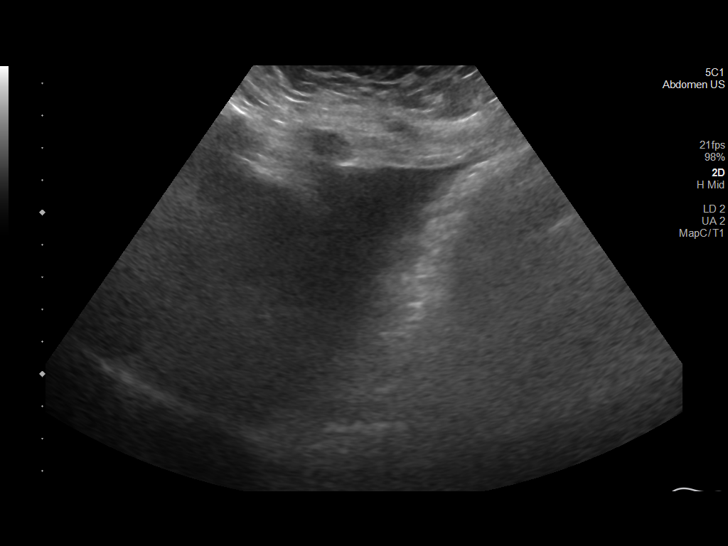
[im 30/36]
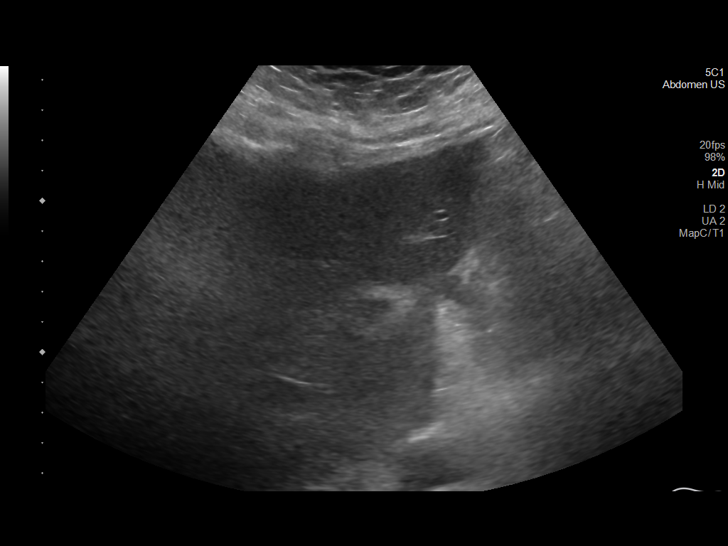
[im 33/36]
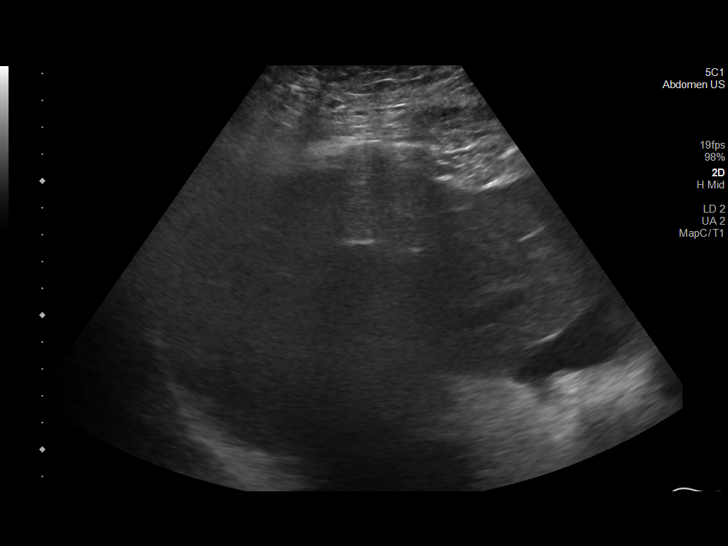
[im 36/36]
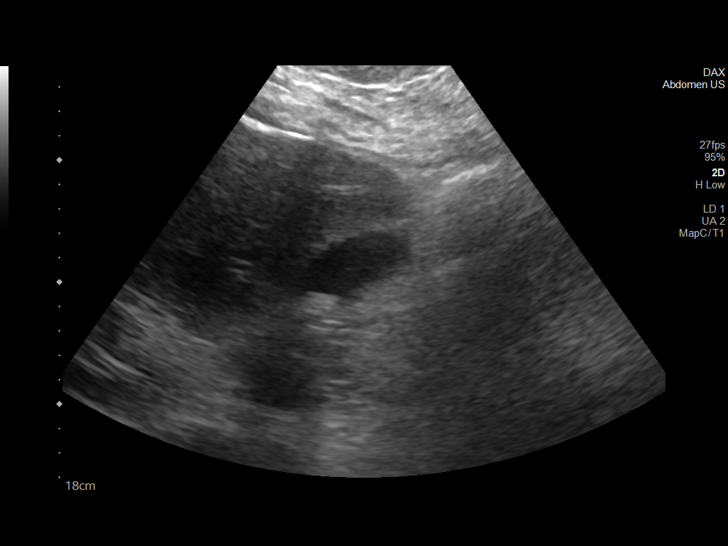

[14 of 25 positions shown; findings below may reference images not displayed]

FINDINGS: Gallbladder:

Limited evaluation demonstrates no abnormalities.

Common bile duct:

Diameter: 4.6 mm

Liver:

No focal lesion identified. Within normal limits in parenchymal
echogenicity. Portal vein is patent on color Doppler imaging with
normal direction of blood flow towards the liver.

Other: None.
IMPRESSION: Limited study due to patient body habitus. No abnormalities are
identified.

## 2023-04-07 ENCOUNTER — Other Ambulatory Visit: Payer: Self-pay | Admitting: Internal Medicine

## 2023-04-07 DIAGNOSIS — E1169 Type 2 diabetes mellitus with other specified complication: Secondary | ICD-10-CM

## 2023-04-15 ENCOUNTER — Other Ambulatory Visit: Payer: Self-pay | Admitting: Internal Medicine

## 2023-04-20 ENCOUNTER — Other Ambulatory Visit (HOSPITAL_BASED_OUTPATIENT_CLINIC_OR_DEPARTMENT_OTHER): Payer: Self-pay | Admitting: Cardiovascular Disease

## 2023-04-29 ENCOUNTER — Telehealth: Payer: Self-pay

## 2023-04-29 ENCOUNTER — Other Ambulatory Visit (HOSPITAL_COMMUNITY): Payer: Self-pay

## 2023-04-29 NOTE — Telephone Encounter (Signed)
Pharmacy Patient Advocate Encounter   Received notification from CoverMyMeds that prior authorization for FreeStyle Libre 3 Sensor is required/requested.   Insurance verification completed.   The patient is insured through [facility] .   Per test claim: PA required; PA submitted to above mentioned insurance via CoverMyMeds Key/confirmation #/EOC [id number] Status is pending

## 2023-04-30 ENCOUNTER — Other Ambulatory Visit (HOSPITAL_COMMUNITY): Payer: Self-pay

## 2023-04-30 NOTE — Telephone Encounter (Signed)
Pharmacy Patient Advocate Encounter  Received notification from [facility] that Prior Authorization for FreeStyle Libre 3 Sensor has been APPROVED from 04/29/2023 to 04/28/2024. Ran test claim, Copay is $0.00. This test claim was processed through [facility]- copay amounts may vary at other pharmacies due to pharmacy/plan contracts, or as the patient moves through the different stages of their insurance plan.   PA #/Case ID/Reference #: [id number]

## 2023-05-05 ENCOUNTER — Other Ambulatory Visit: Payer: Self-pay | Admitting: Internal Medicine

## 2023-05-05 DIAGNOSIS — E1169 Type 2 diabetes mellitus with other specified complication: Secondary | ICD-10-CM

## 2023-05-13 ENCOUNTER — Other Ambulatory Visit: Payer: Self-pay | Admitting: *Deleted

## 2023-05-13 MED ORDER — NOVOLOG FLEXPEN 100 UNIT/ML ~~LOC~~ SOPN: PEN_INJECTOR | SUBCUTANEOUS | 11 refills | Status: AC

## 2023-05-19 ENCOUNTER — Encounter: Payer: Self-pay | Admitting: Internal Medicine

## 2023-05-19 ENCOUNTER — Ambulatory Visit: Payer: Medicaid Other | Admitting: Internal Medicine

## 2023-05-19 VITALS — BP 124/84 | HR 76 | Temp 97.8°F | Wt 228.7 lb

## 2023-05-19 DIAGNOSIS — E1169 Type 2 diabetes mellitus with other specified complication: Secondary | ICD-10-CM | POA: Diagnosis not present

## 2023-05-19 DIAGNOSIS — E559 Vitamin D deficiency, unspecified: Secondary | ICD-10-CM | POA: Diagnosis not present

## 2023-05-19 DIAGNOSIS — I5042 Chronic combined systolic (congestive) and diastolic (congestive) heart failure: Secondary | ICD-10-CM

## 2023-05-19 DIAGNOSIS — E782 Mixed hyperlipidemia: Secondary | ICD-10-CM

## 2023-05-19 DIAGNOSIS — I420 Dilated cardiomyopathy: Secondary | ICD-10-CM | POA: Diagnosis not present

## 2023-05-19 DIAGNOSIS — E538 Deficiency of other specified B group vitamins: Secondary | ICD-10-CM | POA: Diagnosis not present

## 2023-05-19 DIAGNOSIS — Z7985 Long-term (current) use of injectable non-insulin antidiabetic drugs: Secondary | ICD-10-CM

## 2023-05-19 DIAGNOSIS — I10 Essential (primary) hypertension: Secondary | ICD-10-CM | POA: Diagnosis not present

## 2023-05-19 DIAGNOSIS — N182 Chronic kidney disease, stage 2 (mild): Secondary | ICD-10-CM | POA: Diagnosis not present

## 2023-05-19 LAB — POCT GLYCOSYLATED HEMOGLOBIN (HGB A1C): Hemoglobin A1C: 7.3 % — AB (ref 4.0–5.6)

## 2023-05-19 NOTE — Assessment & Plan Note (Signed)
 Uncontrolled, refuses statin, has seen lipid clinic and has also refused inclisiran and Repatha.

## 2023-05-19 NOTE — Assessment & Plan Note (Signed)
Fairly well-controlled on current.

## 2023-05-19 NOTE — Assessment & Plan Note (Signed)
 A1c is 7.3.  Continue Ozempic and mealtime NovoLog.  He has declined referral to endocrinology on multiple occasions.

## 2023-05-19 NOTE — Assessment & Plan Note (Signed)
 Stable, baseline creatinine around 1.150

## 2023-05-19 NOTE — Assessment & Plan Note (Signed)
 Discussed healthy lifestyle, including increased physical activity and better food choices to promote weight loss.

## 2023-05-19 NOTE — Assessment & Plan Note (Signed)
 Stable, followed by cardiology

## 2023-05-19 NOTE — Progress Notes (Signed)
 Established Patient Office Visit     CC/Reason for Visit: Follow-up chronic conditions  HPI: Kevin Long is a 63 y.o. male who is coming in today for the above mentioned reasons. Past Medical History is significant for: Morbid obesity, hypertension, hyperlipidemia, type 2 diabetes, chronic combined heart failure.  Perhaps his biggest issue is his medical nonadherence.  He is now wearing a continuous glucose monitor and has been able to maintain his A1c around 7.3-7.5.  He is aware of the fact that they are changes that he could make in regards to lifestyle changes that would improve his overall health.   Past Medical/Surgical History: Past Medical History:  Diagnosis Date   Abnormal LFTs    Aortic atherosclerosis (HCC) 05/31/2021   Asthma    Bacteremia 09/19/2020   Chest pain of uncertain etiology 05/31/2021   Degeneration of lumbosacral intervertebral disc 09/19/2020   Diabetes mellitus, type 2 (HCC)    Essential hypertension 02/09/2007   Qualifier: Diagnosis of  By: Yetta Barre CNA/MA, Jessica     Gout 09/19/2020   HFrEF (heart failure with reduced ejection fraction) (HCC) 09/25/2016   Echocardiogram 09/22/20: EF < 20, global HK, Gr 2 DD, elevated LA pressure, D shaped LV c/w RV vol overload, mod reduced RVSF, mild BAE, trivial MR, severe TR   History of COVID-19    Admit in 07/2019 - course c/b pulmonary embolism, pneumonia, R lung mass (?infectious), MSSA bacteremia, a/c CHF, metabolic encephalopathy, AKI   Hx of Pulmonary Embolism 08/12/2019   In setting of COVID-19 pneumonia with post obstructive pneumonia (pt left hosp AMA)   Hypercholesterolemia    Mass of upper lobe of right lung 08/12/2019   during admit for COVID-19 pneumonia   Morbid obesity (HCC) 04/21/2013   NICM (nonischemic cardiomyopathy) (HCC) 08/13/2015   Normal Cath in 2014   Obesity    OSA (obstructive sleep apnea)    Pain in joint involving pelvic region and thigh 09/19/2020   Pain in joint, lower leg  09/19/2020   Palpitations 08/13/2015   PVC's (premature ventricular contractions) 03/28/2016   Noted on prior monitor but not quantified   Tobacco use disorder 09/19/2020   Vertigo 09/25/2016    Past Surgical History:  Procedure Laterality Date   FOOT SURGERY     has pin and screws   LEFT AND RIGHT HEART CATHETERIZATION WITH CORONARY ANGIOGRAM N/A 08/04/2012   Procedure: LEFT AND RIGHT HEART CATHETERIZATION WITH CORONARY ANGIOGRAM;  Surgeon: Corky Crafts, MD;  Location: [facility] CATH LAB;  Service: Cardiovascular;  Laterality: N/A;    Social History:  reports that he quit smoking about 18 years ago. His smoking use included cigarettes. He started smoking about 43 years ago. He has a 25 pack-year smoking history. He has never used smokeless tobacco. He reports that he does not drink alcohol and does not use drugs.  Allergies: Allergies  Allergen Reactions   Celebrex [Celecoxib] Swelling    Swelling in face   Rosuvastatin Hives   Entresto [Sacubitril-Valsartan] Other (See Comments)    Dizziness    Family History:  Family History  Problem Relation Age of Onset   Diabetes Mother    Anxiety disorder Brother    Depression Son    Heart murmur Son    Heart attack Neg Hx    Hypertension Neg Hx    Stroke Neg Hx      Current Outpatient Medications:    albuterol (PROVENTIL) (2.5 MG/3ML) 0.083% nebulizer solution, Take 2.5 mg by nebulization every  6 (six) hours as needed for wheezing or shortness of breath., Disp: , Rfl:    albuterol (VENTOLIN HFA) 108 (90 Base) MCG/ACT inhaler, Inhale 2 puffs into the lungs every 6 (six) hours as needed for wheezing or shortness of breath., Disp: 18 g, Rfl: 2   aspirin EC 81 MG tablet, Take 81 mg by mouth daily. Swallow whole., Disp: , Rfl:    carvedilol (COREG) 6.25 MG tablet, TAKE 1 TABLET BY MOUTH TWICE DAILY WITH MEALS . APPOINTMENT REQUIRED FOR FUTURE REFILLS, Disp: 90 tablet, Rfl: 3   Continuous Glucose Sensor (FREESTYLE LIBRE 3 SENSOR) MISC,  Place 1 sensor on the skin every 14 days. Use to check glucose continuously, Disp: 2 each, Rfl: 2   cyanocobalamin (VITAMIN B12) 1000 MCG/ML injection, Inject 1 ml into the muscle once a week for a month.  Then injected 1 ml once a month thereafter, Disp: 6 mL, Rfl: 11   diclofenac Sodium (VOLTAREN) 1 % GEL, Apply 2 g topically daily., Disp: 150 g, Rfl: 2   insulin aspart (NOVOLOG FLEXPEN) 100 UNIT/ML FlexPen, 0-20 Units, Subcutaneous, 3 times daily with meals CBG < 70: Implement Hypoglycemia measures CBG 70 - 120: 0 units CBG 121 - 150: 3 units CBG 151 - 200: 4 units CBG 201 - 250: 7 units CBG 251 - 300: 11 units CBG 301 - 350: 15 units CBG 351 - 400: 20 units CBG > 400: call MD, Disp: 15 mL, Rfl: 11   Insulin Pen Needle 31G X 5 MM MISC, 1 each by Does not apply route daily., Disp: 100 each, Rfl: 3   LINZESS 145 MCG CAPS capsule, TAKE 1 CAPSULE BY MOUTH ONCE DAILY AS NEEDED FOR CONSTIPATION, Disp: 90 capsule, Rfl: 0   NEEDLE, DISP, 25 G (B-D DISP NEEDLE 25GX1") 25G X 1" MISC, Use for b12 injections, Disp: 100 each, Rfl: 2   OZEMPIC, 2 MG/DOSE, 8 MG/3ML SOPN, INJECT 2 MG SUBCUTANEOUSLY ONCE A WEEK, Disp: 3 mL, Rfl: 0   torsemide (DEMADEX) 20 MG tablet, TAKE 1 TABLET BY MOUTH EVERY OTHER DAY, Disp: 45 tablet, Rfl: 2   Tuberculin-Allergy Syringes (VANISHPOINT TUBERCULIN SYRINGE) 25G X 1" 1 ML MISC, USE FOR B12 INJECTIONS, Disp: 100 each, Rfl: 3  Review of Systems:  Negative unless indicated in HPI.   Physical Exam: Vitals:   05/19/23 1258  BP: 124/84  Pulse: 76  Temp: 97.8 F (36.6 C)  TempSrc: Oral  SpO2: 97%  Weight: 228 lb 11.2 oz (103.7 kg)    Body mass index is 36.91 kg/m.   Physical Exam Vitals reviewed.  Constitutional:      Appearance: Normal appearance. He is obese.  HENT:     Head: Normocephalic and atraumatic.  Eyes:     Conjunctiva/sclera: Conjunctivae normal.     Pupils: Pupils are equal, round, and reactive to light.  Cardiovascular:     Rate and Rhythm: Normal  rate and regular rhythm.  Pulmonary:     Effort: Pulmonary effort is normal.     Breath sounds: Normal breath sounds.  Skin:    General: Skin is warm and dry.  Neurological:     General: No focal deficit present.     Mental Status: He is alert and oriented to person, place, and time.  Psychiatric:        Mood and Affect: Mood normal.        Behavior: Behavior normal.        Thought Content: Thought content normal.  Judgment: Judgment normal.      Impression and Plan:  Type 2 diabetes mellitus with other specified complication, without long-term current use of insulin (HCC) Assessment & Plan: A1c is 7.3.  Continue Ozempic and mealtime NovoLog.  He has declined referral to endocrinology on multiple occasions.  Orders: -     POCT glycosylated hemoglobin (Hb A1C)  Essential hypertension Assessment & Plan: Fairly well-controlled on current.   Mixed hyperlipidemia Assessment & Plan: Uncontrolled, refuses statin, has seen lipid clinic and has also refused inclisiran and Repatha.   Morbid obesity (HCC) Assessment & Plan: -Discussed healthy lifestyle, including increased physical activity and better food choices to promote weight loss.    Dilated cardiomyopathy (HCC)  CKD (chronic kidney disease) stage 2, GFR 60-89 ml/min Assessment & Plan: Stable, baseline creatinine around 1.150   Vitamin D deficiency  Vitamin B12 deficiency  Chronic combined systolic and diastolic heart failure (HCC) Assessment & Plan: Stable, followed by cardiology.      Time spent:32 minutes reviewing chart, interviewing and examining patient and formulating plan of care.     Chaya Jan, MD [facility]

## 2023-06-02 ENCOUNTER — Other Ambulatory Visit: Payer: Self-pay | Admitting: Internal Medicine

## 2023-06-02 DIAGNOSIS — E1169 Type 2 diabetes mellitus with other specified complication: Secondary | ICD-10-CM

## 2023-06-28 ENCOUNTER — Other Ambulatory Visit: Payer: Self-pay | Admitting: Internal Medicine

## 2023-06-28 DIAGNOSIS — E1169 Type 2 diabetes mellitus with other specified complication: Secondary | ICD-10-CM

## 2023-07-14 ENCOUNTER — Other Ambulatory Visit: Payer: Self-pay | Admitting: Internal Medicine

## 2023-07-26 ENCOUNTER — Other Ambulatory Visit: Payer: Self-pay | Admitting: Internal Medicine

## 2023-07-26 DIAGNOSIS — E1169 Type 2 diabetes mellitus with other specified complication: Secondary | ICD-10-CM

## 2023-08-18 ENCOUNTER — Other Ambulatory Visit: Payer: Self-pay | Admitting: Internal Medicine

## 2023-08-18 ENCOUNTER — Ambulatory Visit: Payer: Medicaid Other | Admitting: Internal Medicine

## 2023-08-18 ENCOUNTER — Encounter: Payer: Self-pay | Admitting: Internal Medicine

## 2023-08-18 VITALS — BP 130/80 | HR 82 | Temp 98.7°F | Wt 231.3 lb

## 2023-08-18 DIAGNOSIS — Z7984 Long term (current) use of oral hypoglycemic drugs: Secondary | ICD-10-CM

## 2023-08-18 DIAGNOSIS — I10 Essential (primary) hypertension: Secondary | ICD-10-CM | POA: Diagnosis not present

## 2023-08-18 DIAGNOSIS — E1169 Type 2 diabetes mellitus with other specified complication: Secondary | ICD-10-CM

## 2023-08-18 DIAGNOSIS — E538 Deficiency of other specified B group vitamins: Secondary | ICD-10-CM | POA: Diagnosis not present

## 2023-08-18 DIAGNOSIS — E782 Mixed hyperlipidemia: Secondary | ICD-10-CM | POA: Diagnosis not present

## 2023-08-18 LAB — POCT GLYCOSYLATED HEMOGLOBIN (HGB A1C): Hemoglobin A1C: 7.3 % — AB (ref 4.0–5.6)

## 2023-08-18 NOTE — Assessment & Plan Note (Signed)
Fairly well-controlled on current.

## 2023-08-18 NOTE — Assessment & Plan Note (Signed)
 Recheck levels today

## 2023-08-18 NOTE — Assessment & Plan Note (Signed)
 A1c is 7.3.  Continue Ozempic and mealtime NovoLog.  He has declined referral to endocrinology on multiple occasions.

## 2023-08-18 NOTE — Progress Notes (Signed)
 Established Patient Office Visit     CC/Reason for Visit: Follow-up chronic medical conditions  HPI: Kevin Long is a 63 y.o. male who is coming in today for the above mentioned reasons. Past Medical History is significant for: Morbid obesity, hypertension, hyperlipidemia, type 2 diabetes, chronic combined heart failure. Perhaps his biggest issue is his medical nonadherence.  He has no acute concerns or complaints today.   Past Medical/Surgical History: Past Medical History:  Diagnosis Date   Abnormal LFTs    Aortic atherosclerosis (HCC) 05/31/2021   Asthma    Bacteremia 09/19/2020   Chest pain of uncertain etiology 05/31/2021   Degeneration of lumbosacral intervertebral disc 09/19/2020   Diabetes mellitus, type 2 (HCC)    Essential hypertension 02/09/2007   Qualifier: Diagnosis of  By: Rochelle Chu CNA/MA, Jessica     Gout 09/19/2020   HFrEF (heart failure with reduced ejection fraction) (HCC) 09/25/2016   Echocardiogram 09/22/20: EF < 20, global HK, Gr 2 DD, elevated LA pressure, D shaped LV c/w RV vol overload, mod reduced RVSF, mild BAE, trivial MR, severe TR   History of COVID-19    Admit in 07/2019 - course c/b pulmonary embolism, pneumonia, R lung mass (?infectious), MSSA bacteremia, a/c CHF, metabolic encephalopathy, AKI   Hx of Pulmonary Embolism 08/12/2019   In setting of COVID-19 pneumonia with post obstructive pneumonia (pt left hosp AMA)   Hypercholesterolemia    Mass of upper lobe of right lung 08/12/2019   during admit for COVID-19 pneumonia   Morbid obesity (HCC) 04/21/2013   NICM (nonischemic cardiomyopathy) (HCC) 08/13/2015   Normal Cath in 2014   Obesity    OSA (obstructive sleep apnea)    Pain in joint involving pelvic region and thigh 09/19/2020   Pain in joint, lower leg 09/19/2020   Palpitations 08/13/2015   PVC's (premature ventricular contractions) 03/28/2016   Noted on prior monitor but not quantified   Tobacco use disorder 09/19/2020   Vertigo  09/25/2016    Past Surgical History:  Procedure Laterality Date   FOOT SURGERY     has pin and screws   LEFT AND RIGHT HEART CATHETERIZATION WITH CORONARY ANGIOGRAM N/A 08/04/2012   Procedure: LEFT AND RIGHT HEART CATHETERIZATION WITH CORONARY ANGIOGRAM;  Surgeon: Lucendia Rusk, MD;  Location: [facility] CATH LAB;  Service: Cardiovascular;  Laterality: N/A;    Social History:  reports that he quit smoking about 18 years ago. His smoking use included cigarettes. He started smoking about 43 years ago. He has a 25 pack-year smoking history. He has never used smokeless tobacco. He reports that he does not drink alcohol and does not use drugs.  Allergies: Allergies  Allergen Reactions   Celebrex [Celecoxib] Swelling    Swelling in face   Rosuvastatin  Hives   Entresto  [Sacubitril -Valsartan ] Other (See Comments)    Dizziness    Family History:  Family History  Problem Relation Age of Onset   Diabetes Mother    Anxiety disorder Brother    Depression Son    Heart murmur Son    Heart attack Neg Hx    Hypertension Neg Hx    Stroke Neg Hx      Current Outpatient Medications:    albuterol  (PROVENTIL ) (2.5 MG/3ML) 0.083% nebulizer solution, Take 2.5 mg by nebulization every 6 (six) hours as needed for wheezing or shortness of breath., Disp: , Rfl:    albuterol  (VENTOLIN  HFA) 108 (90 Base) MCG/ACT inhaler, Inhale 2 puffs into the lungs every 6 (six) hours as  needed for wheezing or shortness of breath., Disp: 18 g, Rfl: 2   aspirin  EC 81 MG tablet, Take 81 mg by mouth daily. Swallow whole., Disp: , Rfl:    carvedilol  (COREG ) 6.25 MG tablet, TAKE 1 TABLET BY MOUTH TWICE DAILY WITH MEALS . APPOINTMENT REQUIRED FOR FUTURE REFILLS, Disp: 90 tablet, Rfl: 3   cyanocobalamin  (VITAMIN B12) 1000 MCG/ML injection, Inject 1 ml into the muscle once a week for a month.  Then injected 1 ml once a month thereafter, Disp: 6 mL, Rfl: 11   diclofenac  Sodium (VOLTAREN ) 1 % GEL, Apply 2 g topically daily., Disp:  150 g, Rfl: 2   insulin  aspart (NOVOLOG  FLEXPEN) 100 UNIT/ML FlexPen, 0-20 Units, Subcutaneous, 3 times daily with meals CBG < 70: Implement Hypoglycemia measures CBG 70 - 120: 0 units CBG 121 - 150: 3 units CBG 151 - 200: 4 units CBG 201 - 250: 7 units CBG 251 - 300: 11 units CBG 301 - 350: 15 units CBG 351 - 400: 20 units CBG > 400: call MD, Disp: 15 mL, Rfl: 11   Insulin  Pen Needle 31G X 5 MM MISC, 1 each by Does not apply route daily., Disp: 100 each, Rfl: 3   linaclotide  (LINZESS ) 145 MCG CAPS capsule, TAKE 1 CAPSULE BY MOUTH ONCE DAILY AS NEEDED FOR CONSTIPATION, Disp: 90 capsule, Rfl: 1   NEEDLE, DISP, 25 G (B-D DISP NEEDLE 25GX1") 25G X 1" MISC, Use for b12 injections, Disp: 100 each, Rfl: 2   OZEMPIC , 2 MG/DOSE, 8 MG/3ML SOPN, INJECT 2MG   SUBCUTANEOUSLY ONCE A WEEK, Disp: 3 mL, Rfl: 0   torsemide  (DEMADEX ) 20 MG tablet, TAKE 1 TABLET BY MOUTH EVERY OTHER DAY, Disp: 45 tablet, Rfl: 2   Tuberculin-Allergy Syringes (VANISHPOINT TUBERCULIN SYRINGE) 25G X 1" 1 ML MISC, USE FOR B12 INJECTIONS, Disp: 100 each, Rfl: 3   Continuous Glucose Sensor (FREESTYLE LIBRE 3 SENSOR) MISC, PLACE 1 SENSOR ON THE SKIN EVERY 14 DAYS .USE TO CHECK GLUCOSE CONTINUOUSLY., Disp: 2 each, Rfl: 5  Review of Systems:  Negative unless indicated in HPI.   Physical Exam: Vitals:   08/18/23 1317  BP: 130/80  Pulse: 82  Temp: 98.7 F (37.1 C)  TempSrc: Temporal  SpO2: 92%  Weight: 231 lb 4.8 oz (104.9 kg)    Body mass index is 37.33 kg/m.   Physical Exam   Impression and Plan:  Type 2 diabetes mellitus with other specified complication, without long-term current use of insulin  (HCC) Assessment & Plan: A1c is 7.3.  Continue Ozempic  and mealtime NovoLog .  He has declined referral to endocrinology on multiple occasions.  Orders: -     POCT glycosylated hemoglobin (Hb A1C)  Vitamin B12 deficiency Assessment & Plan: Recheck levels today.  Orders: -     Vitamin B12  Essential hypertension Assessment  & Plan: Fairly well-controlled on current.   Mixed hyperlipidemia Assessment & Plan: Uncontrolled, refuses statin, has seen lipid clinic and has also refused inclisiran and Repatha .      Time spent:32 minutes reviewing chart, interviewing and examining patient and formulating plan of care.     Marguerita Shih, MD [facility] at [facility]

## 2023-08-18 NOTE — Assessment & Plan Note (Signed)
 Uncontrolled, refuses statin, has seen lipid clinic and has also refused inclisiran and Repatha.

## 2023-08-26 ENCOUNTER — Other Ambulatory Visit: Payer: Self-pay | Admitting: Internal Medicine

## 2023-08-26 DIAGNOSIS — E1169 Type 2 diabetes mellitus with other specified complication: Secondary | ICD-10-CM

## 2023-08-27 ENCOUNTER — Telehealth: Payer: Self-pay

## 2023-08-27 ENCOUNTER — Other Ambulatory Visit (HOSPITAL_COMMUNITY): Payer: Self-pay

## 2023-08-27 NOTE — Telephone Encounter (Signed)
 Pharmacy Patient Advocate Encounter   Received notification from CoverMyMeds that prior authorization for Ozempic  8 is required/requested.   Insurance verification completed.   The patient is insured through [facility] .   Per test claim: PA required; PA submitted to above mentioned insurance via CoverMyMeds Key/confirmation #/EOC [id number] Status is pending

## 2023-08-27 NOTE — Telephone Encounter (Signed)
 Pharmacy Patient Advocate Encounter  Received notification from [facility] that Prior Authorization for Ozempic  8 has been APPROVED from 08/27/23 to 08/26/24. Ran test claim, Copay is $4.00. This test claim was processed through [facility]- copay amounts may vary at other pharmacies due to pharmacy/plan contracts, or as the patient moves through the different stages of their insurance plan.   PA #/Case ID/Reference #: [id number]

## 2023-09-28 ENCOUNTER — Other Ambulatory Visit: Payer: Self-pay | Admitting: Internal Medicine

## 2023-09-28 DIAGNOSIS — E1169 Type 2 diabetes mellitus with other specified complication: Secondary | ICD-10-CM

## 2023-10-05 ENCOUNTER — Telehealth: Payer: Self-pay | Admitting: *Deleted

## 2023-10-05 DIAGNOSIS — I10 Essential (primary) hypertension: Secondary | ICD-10-CM

## 2023-10-05 NOTE — Progress Notes (Unsigned)
 Complex Care Management Note Care Guide Note  10/05/2023 Name: Kevin Long MRN: [id number] DOB: 11/05/1960   Complex Care Management Outreach Attempts: An unsuccessful telephone outreach was attempted today to offer the patient information about available complex care management services.  Follow Up Plan:  Additional outreach attempts will be made to offer the patient complex care management information and services.   Encounter Outcome:  No Answer  Harlene Satterfield  [facility]  [facility], [facility] Guide  Direct Dial: [phone]  Fax [phone]

## 2023-10-06 NOTE — Progress Notes (Unsigned)
 Complex Care Management Note Care Guide Note  10/06/2023 Name: Kevin Long MRN: [id number] DOB: 03-28-1961   Complex Care Management Outreach Attempts: A second unsuccessful outreach was attempted today to offer the patient with information about available complex care management services.  Follow Up Plan:  Additional outreach attempts will be made to offer the patient complex care management information and services.   Encounter Outcome:  No Answer  Harlene Satterfield  [facility]  [facility] Guide  Direct Dial: [phone]  Fax [phone]

## 2023-10-07 ENCOUNTER — Telehealth: Payer: Self-pay | Admitting: Internal Medicine

## 2023-10-07 MED ORDER — CARVEDILOL 6.25 MG PO TABS: 6.2500 mg | ORAL_TABLET | Freq: Two times a day (BID) | ORAL | 0 refills | Status: DC

## 2023-10-07 NOTE — Progress Notes (Signed)
 Complex Care Management Note Care Guide Note  10/07/2023 Name: Kevin Long MRN: [id number] DOB: November 17, 1960   Complex Care Management Outreach Attempts: A third unsuccessful outreach was attempted today to offer the patient with information about available complex care management services.  Follow Up Plan:  No further outreach attempts will be made at this time. We have been unable to contact the patient to offer or enroll patient in complex care management services.  Encounter Outcome:  No Answer  Harlene Satterfield  [facility]  [facility], [facility] Guide  Direct Dial: [phone]  Fax [phone]

## 2023-10-07 NOTE — Telephone Encounter (Signed)
 Copied from CRM [id number]. Topic: Clinical - Medication Refill >> Oct 07, 2023  1:57 PM Franky GRADE wrote: Medication: carvedilol  (COREG ) 6.25 MG tablet [[id number]]  Has the patient contacted their pharmacy? No, they asked patient to contact ordering provider.  (Agent: If no, request that the patient contact the pharmacy for the refill. If patient does not wish to contact the pharmacy document the reason why and proceed with request.) (Agent: If yes, when and what did the pharmacy advise?)  This is the patient's preferred pharmacy:  [id number] - [place] - [address] [place] ([place]) [place] [address] Phone: [phone] Fax: [phone]    Is this the correct pharmacy for this prescription? Yes If no, delete pharmacy and type the correct one.   Has the prescription been filled recently? No  Is the patient out of the medication? Yes  Has the patient been seen for an appointment in the last year OR does the patient have an upcoming appointment? Yes  Can we respond through MyChart? Yes  Agent: Please be advised that Rx refills may take up to 3 business days. We ask that you follow-up with your pharmacy.

## 2023-10-23 ENCOUNTER — Other Ambulatory Visit: Payer: Self-pay | Admitting: Internal Medicine

## 2023-10-23 DIAGNOSIS — E1169 Type 2 diabetes mellitus with other specified complication: Secondary | ICD-10-CM

## 2023-11-16 ENCOUNTER — Other Ambulatory Visit: Payer: Self-pay | Admitting: Internal Medicine

## 2023-11-16 DIAGNOSIS — E1169 Type 2 diabetes mellitus with other specified complication: Secondary | ICD-10-CM

## 2023-11-18 ENCOUNTER — Ambulatory Visit (INDEPENDENT_AMBULATORY_CARE_PROVIDER_SITE_OTHER): Admitting: Internal Medicine

## 2023-11-18 ENCOUNTER — Encounter: Payer: Self-pay | Admitting: Internal Medicine

## 2023-11-18 VITALS — BP 124/84 | HR 76 | Temp 98.1°F | Wt 236.2 lb

## 2023-11-18 DIAGNOSIS — E782 Mixed hyperlipidemia: Secondary | ICD-10-CM | POA: Diagnosis not present

## 2023-11-18 DIAGNOSIS — E1169 Type 2 diabetes mellitus with other specified complication: Secondary | ICD-10-CM

## 2023-11-18 DIAGNOSIS — I10 Essential (primary) hypertension: Secondary | ICD-10-CM | POA: Diagnosis not present

## 2023-11-18 DIAGNOSIS — J4520 Mild intermittent asthma, uncomplicated: Secondary | ICD-10-CM

## 2023-11-18 DIAGNOSIS — Z7985 Long-term (current) use of injectable non-insulin antidiabetic drugs: Secondary | ICD-10-CM

## 2023-11-18 LAB — POCT GLYCOSYLATED HEMOGLOBIN (HGB A1C): Hemoglobin A1C: 7.5 % — AB (ref 4.0–5.6)

## 2023-11-18 MED ORDER — ALBUTEROL SULFATE HFA 108 (90 BASE) MCG/ACT IN AERS: 2.0000 | INHALATION_SPRAY | Freq: Four times a day (QID) | RESPIRATORY_TRACT | 2 refills | Status: AC | PRN

## 2023-11-18 NOTE — Assessment & Plan Note (Signed)
 Uncontrolled, refuses statin, has seen lipid clinic and has also refused inclisiran and Repatha.

## 2023-11-18 NOTE — Progress Notes (Signed)
 Established Patient Office Visit     CC/Reason for Visit: Follow-up chronic conditions  HPI: Kevin Long is a 63 y.o. male who is coming in today for the above mentioned reasons. Past Medical History is significant for: Morbid obesity, hypertension, hyperlipidemia, type 2 diabetes, chronic combined heart failure.  Feeling well without concerns or complaints.  He is known to be nonadherent to medical therapy.  He agrees to ophthalmology and endocrinology referral today.  Has declined treatment for lipids.  Is due for Medicare wellness visit.   Past Medical/Surgical History: Past Medical History:  Diagnosis Date   Abnormal LFTs    Aortic atherosclerosis (HCC) 05/31/2021   Asthma    Bacteremia 09/19/2020   Chest pain of uncertain etiology 05/31/2021   Degeneration of lumbosacral intervertebral disc 09/19/2020   Diabetes mellitus, type 2 (HCC)    Essential hypertension 02/09/2007   Qualifier: Diagnosis of  By: Joshua CNA/MA, Jessica     Gout 09/19/2020   HFrEF (heart failure with reduced ejection fraction) (HCC) 09/25/2016   Echocardiogram 09/22/20: EF < 20, global HK, Gr 2 DD, elevated LA pressure, D shaped LV c/w RV vol overload, mod reduced RVSF, mild BAE, trivial MR, severe TR   History of COVID-19    Admit in 07/2019 - course c/b pulmonary embolism, pneumonia, R lung mass (?infectious), MSSA bacteremia, a/c CHF, metabolic encephalopathy, AKI   Hx of Pulmonary Embolism 08/12/2019   In setting of COVID-19 pneumonia with post obstructive pneumonia (pt left hosp AMA)   Hypercholesterolemia    Mass of upper lobe of right lung 08/12/2019   during admit for COVID-19 pneumonia   Morbid obesity (HCC) 04/21/2013   NICM (nonischemic cardiomyopathy) (HCC) 08/13/2015   Normal Cath in 2014   Obesity    OSA (obstructive sleep apnea)    Pain in joint involving pelvic region and thigh 09/19/2020   Pain in joint, lower leg 09/19/2020   Palpitations 08/13/2015   PVC's (premature ventricular  contractions) 03/28/2016   Noted on prior monitor but not quantified   Tobacco use disorder 09/19/2020   Vertigo 09/25/2016    Past Surgical History:  Procedure Laterality Date   FOOT SURGERY     has pin and screws   LEFT AND RIGHT HEART CATHETERIZATION WITH CORONARY ANGIOGRAM N/A 08/04/2012   Procedure: LEFT AND RIGHT HEART CATHETERIZATION WITH CORONARY ANGIOGRAM;  Surgeon: Candyce GORMAN Reek, MD;  Location: [facility] CATH LAB;  Service: Cardiovascular;  Laterality: N/A;    Social History:  reports that he quit smoking about 18 years ago. His smoking use included cigarettes. He started smoking about 43 years ago. He has a 25 pack-year smoking history. He has never used smokeless tobacco. He reports that he does not drink alcohol and does not use drugs.  Allergies: Allergies  Allergen Reactions   Celebrex [Celecoxib] Swelling    Swelling in face   Rosuvastatin  Hives   Entresto  [Sacubitril -Valsartan ] Other (See Comments)    Dizziness    Family History:  Family History  Problem Relation Age of Onset   Diabetes Mother    Anxiety disorder Brother    Depression Son    Heart murmur Son    Heart attack Neg Hx    Hypertension Neg Hx    Stroke Neg Hx      Current Outpatient Medications:    albuterol  (PROVENTIL ) (2.5 MG/3ML) 0.083% nebulizer solution, Take 2.5 mg by nebulization every 6 (six) hours as needed for wheezing or shortness of breath., Disp: , Rfl:  aspirin  EC 81 MG tablet, Take 81 mg by mouth daily. Swallow whole., Disp: , Rfl:    carvedilol  (COREG ) 6.25 MG tablet, Take 1 tablet (6.25 mg total) by mouth 2 (two) times daily with a meal., Disp: 180 tablet, Rfl: 0   Continuous Glucose Sensor (FREESTYLE LIBRE 3 SENSOR) MISC, PLACE 1 SENSOR ON THE SKIN EVERY 14 DAYS .USE TO CHECK GLUCOSE CONTINUOUSLY., Disp: 2 each, Rfl: 5   diclofenac  Sodium (VOLTAREN ) 1 % GEL, Apply 2 g topically daily., Disp: 150 g, Rfl: 2   insulin  aspart (NOVOLOG  FLEXPEN) 100 UNIT/ML FlexPen, 0-20 Units,  Subcutaneous, 3 times daily with meals CBG < 70: Implement Hypoglycemia measures CBG 70 - 120: 0 units CBG 121 - 150: 3 units CBG 151 - 200: 4 units CBG 201 - 250: 7 units CBG 251 - 300: 11 units CBG 301 - 350: 15 units CBG 351 - 400: 20 units CBG > 400: call MD, Disp: 15 mL, Rfl: 11   Insulin  Pen Needle 31G X 5 MM MISC, 1 each by Does not apply route daily., Disp: 100 each, Rfl: 3   linaclotide  (LINZESS ) 145 MCG CAPS capsule, TAKE 1 CAPSULE BY MOUTH ONCE DAILY AS NEEDED FOR CONSTIPATION, Disp: 90 capsule, Rfl: 1   NEEDLE, DISP, 25 G (B-D DISP NEEDLE 25GX1) 25G X 1 MISC, Use for b12 injections, Disp: 100 each, Rfl: 2   OZEMPIC , 2 MG/DOSE, 8 MG/3ML SOPN, INJECT 2 MG  SUBCUTANEOUSLY ONCE A WEEK, Disp: 3 mL, Rfl: 0   torsemide  (DEMADEX ) 20 MG tablet, TAKE 1 TABLET BY MOUTH EVERY OTHER DAY, Disp: 45 tablet, Rfl: 2   Tuberculin-Allergy Syringes (VANISHPOINT TUBERCULIN SYRINGE) 25G X 1 1 ML MISC, USE FOR B12 INJECTIONS, Disp: 100 each, Rfl: 3   albuterol  (VENTOLIN  HFA) 108 (90 Base) MCG/ACT inhaler, Inhale 2 puffs into the lungs every 6 (six) hours as needed for wheezing or shortness of breath., Disp: 18 g, Rfl: 2   cyanocobalamin  (VITAMIN B12) 1000 MCG/ML injection, Inject 1 ml into the muscle once a week for a month.  Then injected 1 ml once a month thereafter (Patient not taking: Reported on 11/18/2023), Disp: 6 mL, Rfl: 11  Review of Systems:  Negative unless indicated in HPI.   Physical Exam: Vitals:   11/18/23 1530  BP: 124/84  Pulse: 76  Temp: 98.1 F (36.7 C)  TempSrc: Oral  SpO2: 98%  Weight: 236 lb 3.2 oz (107.1 kg)    Body mass index is 38.12 kg/m.   Physical Exam Vitals reviewed.  Constitutional:      Appearance: Normal appearance. He is obese.  HENT:     Head: Normocephalic and atraumatic.  Eyes:     Conjunctiva/sclera: Conjunctivae normal.  Cardiovascular:     Rate and Rhythm: Normal rate and regular rhythm.  Pulmonary:     Effort: Pulmonary effort is normal.      Breath sounds: Normal breath sounds.  Skin:    General: Skin is warm and dry.  Neurological:     General: No focal deficit present.     Mental Status: He is alert and oriented to person, place, and time.  Psychiatric:        Mood and Affect: Mood normal.        Behavior: Behavior normal.        Thought Content: Thought content normal.        Judgment: Judgment normal.      Impression and Plan:  Type 2 diabetes mellitus with other specified  complication, without long-term current use of insulin  (HCC) Assessment & Plan: A1c is 7.5.  Continue Ozempic  and mealtime NovoLog .  Placed referral to endocrinology.  Orders: -     POCT glycosylated hemoglobin (Hb A1C) -     Ambulatory referral to Ophthalmology -     Ambulatory referral to Endocrinology  Essential hypertension Assessment & Plan: Fairly well-controlled on current.   Mixed hyperlipidemia Assessment & Plan: Uncontrolled, refuses statin, has seen lipid clinic and has also refused inclisiran and Repatha .   Morbid obesity (HCC) Assessment & Plan: -Discussed healthy lifestyle, including increased physical activity and better food choices to promote weight loss.    Mild intermittent asthma without complication -     Albuterol  Sulfate HFA; Inhale 2 puffs into the lungs every 6 (six) hours as needed for wheezing or shortness of breath.  Dispense: 18 g; Refill: 2     Time spent:33 minutes reviewing chart, interviewing and examining patient and formulating plan of care.     Tully Theophilus Andrews, MD [facility] at [facility]

## 2023-11-18 NOTE — Assessment & Plan Note (Signed)
 A1c is 7.5.  Continue Ozempic  and mealtime NovoLog .  Placed referral to endocrinology.

## 2023-11-18 NOTE — Assessment & Plan Note (Signed)
 Discussed healthy lifestyle, including increased physical activity and better food choices to promote weight loss.

## 2023-11-18 NOTE — Assessment & Plan Note (Signed)
Fairly well-controlled on current.

## 2023-12-09 ENCOUNTER — Other Ambulatory Visit: Payer: Self-pay | Admitting: Internal Medicine

## 2023-12-09 DIAGNOSIS — E1169 Type 2 diabetes mellitus with other specified complication: Secondary | ICD-10-CM

## 2023-12-16 ENCOUNTER — Telehealth: Payer: Self-pay

## 2023-12-16 ENCOUNTER — Other Ambulatory Visit (HOSPITAL_COMMUNITY): Payer: Self-pay

## 2023-12-16 NOTE — Telephone Encounter (Signed)
 Pharmacy Patient Advocate Encounter   Received notification from Onbase that prior authorization for Ozempic  (2 MG/DOSE) 8MG /3ML pen-injectors is required/requested.   Insurance verification completed.   The patient is insured through [facility] .   Per test claim: PA required; PA submitted to above mentioned insurance via Latent Key/confirmation #/EOC [facility] Status is pending

## 2023-12-17 ENCOUNTER — Other Ambulatory Visit (HOSPITAL_COMMUNITY): Payer: Self-pay

## 2023-12-17 NOTE — Telephone Encounter (Signed)
 Pharmacy Patient Advocate Encounter  Received notification from [employer] that Prior Authorization for  Ozempic  (2 MG/DOSE) 8MG /3ML pen-injectors  has been APPROVED from 12/16/23 to 03/30/2098. Ran test claim, Copay is $4.80. This test claim was processed through [facility]- copay amounts may vary at other pharmacies due to pharmacy/plan contracts, or as the patient moves through the different stages of their insurance plan.   PA #/Case ID/Reference #: [id number]

## 2023-12-31 ENCOUNTER — Other Ambulatory Visit: Payer: Self-pay | Admitting: Internal Medicine

## 2024-01-11 ENCOUNTER — Other Ambulatory Visit: Payer: Self-pay | Admitting: Internal Medicine

## 2024-01-11 DIAGNOSIS — E1169 Type 2 diabetes mellitus with other specified complication: Secondary | ICD-10-CM

## 2024-01-18 ENCOUNTER — Encounter (HOSPITAL_BASED_OUTPATIENT_CLINIC_OR_DEPARTMENT_OTHER): Payer: Self-pay

## 2024-01-19 ENCOUNTER — Ambulatory Visit (HOSPITAL_BASED_OUTPATIENT_CLINIC_OR_DEPARTMENT_OTHER): Admitting: Cardiovascular Disease

## 2024-02-01 ENCOUNTER — Other Ambulatory Visit: Payer: Self-pay | Admitting: Internal Medicine

## 2024-02-09 ENCOUNTER — Other Ambulatory Visit: Payer: Self-pay | Admitting: Internal Medicine

## 2024-02-09 DIAGNOSIS — E1169 Type 2 diabetes mellitus with other specified complication: Secondary | ICD-10-CM

## 2024-03-03 ENCOUNTER — Telehealth: Payer: Self-pay | Admitting: Internal Medicine

## 2024-03-03 NOTE — Telephone Encounter (Signed)
 Patient states he needs a refill on the [facility] 3 sensor.  He said that one was sent in, however he said it doesn't need to be verified through Medicare A/B, only the [employer] because they are supposed to cover it 100%.  If any questions please call the patient- [phone].    Pharmacy- [facility] on [address] and [address].

## 2024-03-05 ENCOUNTER — Other Ambulatory Visit: Payer: Self-pay | Admitting: Internal Medicine

## 2024-03-05 DIAGNOSIS — E1169 Type 2 diabetes mellitus with other specified complication: Secondary | ICD-10-CM

## 2024-03-07 ENCOUNTER — Other Ambulatory Visit (HOSPITAL_COMMUNITY): Payer: Self-pay

## 2024-03-07 ENCOUNTER — Telehealth: Payer: Self-pay

## 2024-03-07 NOTE — Telephone Encounter (Signed)
 Pharmacy Patient Advocate Encounter   Received notification from [employer]s Mess[employer] that prior authorization for Freestyle Libre 3 sensors is required/requested.   Insurance verification completed.   The patient is insured through [employer].   Per test claim: The current 28 day co-pay is, $0.00.  No PA needed at this time. This test claim was processed through [facility]- copay amounts may vary at other pharmacies due to pharmacy/plan contracts, or as the patient moves through the different stages of their insurance plan.     Patient current approved PA expires 04/28/24.

## 2024-03-08 ENCOUNTER — Other Ambulatory Visit (HOSPITAL_COMMUNITY): Payer: Self-pay

## 2024-03-08 NOTE — Telephone Encounter (Signed)
 PA request has been Received. New Encounter has been or will be created for follow up. For additional info see Pharmacy Prior Auth telephone encounter from 03/07/24.

## 2024-03-14 ENCOUNTER — Other Ambulatory Visit (HOSPITAL_BASED_OUTPATIENT_CLINIC_OR_DEPARTMENT_OTHER): Payer: Self-pay

## 2024-03-14 ENCOUNTER — Ambulatory Visit: Admitting: Internal Medicine

## 2024-03-14 ENCOUNTER — Encounter: Payer: Self-pay | Admitting: Internal Medicine

## 2024-03-14 VITALS — BP 130/77 | HR 88 | Ht 66.0 in | Wt 241.0 lb

## 2024-03-14 DIAGNOSIS — Z0000 Encounter for general adult medical examination without abnormal findings: Secondary | ICD-10-CM

## 2024-03-14 DIAGNOSIS — E785 Hyperlipidemia, unspecified: Secondary | ICD-10-CM

## 2024-03-14 DIAGNOSIS — E1122 Type 2 diabetes mellitus with diabetic chronic kidney disease: Secondary | ICD-10-CM | POA: Insufficient documentation

## 2024-03-14 DIAGNOSIS — E559 Vitamin D deficiency, unspecified: Secondary | ICD-10-CM

## 2024-03-14 DIAGNOSIS — E1165 Type 2 diabetes mellitus with hyperglycemia: Secondary | ICD-10-CM

## 2024-03-14 DIAGNOSIS — E538 Deficiency of other specified B group vitamins: Secondary | ICD-10-CM

## 2024-03-14 DIAGNOSIS — I10 Essential (primary) hypertension: Secondary | ICD-10-CM

## 2024-03-14 DIAGNOSIS — E1169 Type 2 diabetes mellitus with other specified complication: Secondary | ICD-10-CM

## 2024-03-14 LAB — CBC WITH DIFFERENTIAL/PLATELET
Basophils Absolute: 0.1 K/uL (ref 0.0–0.1)
Basophils Relative: 1.3 % (ref 0.0–3.0)
Eosinophils Absolute: 0.4 K/uL (ref 0.0–0.7)
Eosinophils Relative: 5.1 % — ABNORMAL HIGH (ref 0.0–5.0)
HCT: 41.6 % (ref 39.0–52.0)
Hemoglobin: 14 g/dL (ref 13.0–17.0)
Lymphocytes Relative: 23.9 % (ref 12.0–46.0)
Lymphs Abs: 2 K/uL (ref 0.7–4.0)
MCHC: 33.7 g/dL (ref 30.0–36.0)
MCV: 85.8 fl (ref 78.0–100.0)
Monocytes Absolute: 0.7 K/uL (ref 0.1–1.0)
Monocytes Relative: 8.5 % (ref 3.0–12.0)
Neutro Abs: 5.1 K/uL (ref 1.4–7.7)
Neutrophils Relative %: 61.2 % (ref 43.0–77.0)
Platelets: 282 K/uL (ref 150.0–400.0)
RBC: 4.85 Mil/uL (ref 4.22–5.81)
RDW: 14.8 % (ref 11.5–15.5)
WBC: 8.3 K/uL (ref 4.0–10.5)

## 2024-03-14 LAB — COMPREHENSIVE METABOLIC PANEL WITH GFR
ALT: 9 U/L (ref 0–53)
AST: 13 U/L (ref 0–37)
Albumin: 4.3 g/dL (ref 3.5–5.2)
Alkaline Phosphatase: 88 U/L (ref 39–117)
BUN: 8 mg/dL (ref 6–23)
CO2: 26 meq/L (ref 19–32)
Calcium: 9.3 mg/dL (ref 8.4–10.5)
Chloride: 104 meq/L (ref 96–112)
Creatinine, Ser: 1.1 mg/dL (ref 0.40–1.50)
GFR: 70.55 mL/min (ref >60.00)
Glucose, Bld: 149 mg/dL — ABNORMAL HIGH (ref 70–99)
Potassium: 3.9 meq/L (ref 3.5–5.1)
Sodium: 138 meq/L (ref 135–145)
Total Bilirubin: 0.8 mg/dL (ref 0.2–1.2)
Total Protein: 6.9 g/dL (ref 6.0–8.3)

## 2024-03-14 LAB — LIPID PANEL
Cholesterol: 266 mg/dL — ABNORMAL HIGH (ref 0–200)
HDL: 80.7 mg/dL (ref >39.00)
LDL Cholesterol: 160 mg/dL — ABNORMAL HIGH (ref 0–99)
NonHDL: 185.29
Total CHOL/HDL Ratio: 3
Triglycerides: 128 mg/dL (ref 0.0–149.0)
VLDL: 25.6 mg/dL (ref 0.0–40.0)

## 2024-03-14 LAB — HEMOGLOBIN A1C: Hgb A1c MFr Bld: 7.6 % — ABNORMAL HIGH (ref 4.6–6.5)

## 2024-03-14 LAB — VITAMIN D 25 HYDROXY (VIT D DEFICIENCY, FRACTURES): VITD: 19.54 ng/mL — ABNORMAL LOW (ref 30.00–100.00)

## 2024-03-14 LAB — VITAMIN B12: Vitamin B-12: 168 pg/mL — ABNORMAL LOW (ref 211–911)

## 2024-03-14 LAB — PSA: PSA: 1.68 ng/mL (ref 0.10–4.00)

## 2024-03-14 MED ORDER — FREESTYLE LIBRE 3 PLUS SENSOR MISC
3 refills | Status: AC
  Filled 2024-03-14: qty 2, 30d supply, fill #0
  Filled 2024-04-21: qty 2, 30d supply, fill #1

## 2024-03-14 MED ORDER — DICLOFENAC SODIUM 1 % EX GEL: 2.0000 g | Freq: Every day | CUTANEOUS | 2 refills | Status: AC

## 2024-03-14 NOTE — Progress Notes (Signed)
 Established Patient Office Visit     CC/Reason for Visit: Welcome to Medicare visit  HPI: Kevin Long is a 63 y.o. male who is coming in today for the above mentioned reasons. Past Medical History is significant for: Morbid obesity, hypertension, hyperlipidemia, type 2 diabetes, chronic combined heart failure.  He is known to be nonadherent.  He declines all immunizations and cancer screenings.   Past Medical/Surgical History: Past Medical History:  Diagnosis Date   Abnormal LFTs    Aortic atherosclerosis 05/31/2021   Asthma    Bacteremia 09/19/2020   Chest pain of uncertain etiology 05/31/2021   Degeneration of lumbosacral intervertebral disc 09/19/2020   Diabetes mellitus, type 2 (HCC)    Essential hypertension 02/09/2007   Qualifier: Diagnosis of  By: Joshua CNA/MA, Jessica     Gout 09/19/2020   HFrEF (heart failure with reduced ejection fraction) (HCC) 09/25/2016   Echocardiogram 09/22/20: EF < 20, global HK, Gr 2 DD, elevated LA pressure, D shaped LV c/w RV vol overload, mod reduced RVSF, mild BAE, trivial MR, severe TR   History of COVID-19    Admit in 07/2019 - course c/b pulmonary embolism, pneumonia, R lung mass (?infectious), MSSA bacteremia, a/c CHF, metabolic encephalopathy, AKI   Hx of Pulmonary Embolism 08/12/2019   In setting of COVID-19 pneumonia with post obstructive pneumonia (pt left hosp AMA)   Hypercholesterolemia    Mass of upper lobe of right lung 08/12/2019   during admit for COVID-19 pneumonia   Morbid obesity (HCC) 04/21/2013   NICM (nonischemic cardiomyopathy) (HCC) 08/13/2015   Normal Cath in 2014   Obesity    OSA (obstructive sleep apnea)    Pain in joint involving pelvic region and thigh 09/19/2020   Pain in joint, lower leg 09/19/2020   Palpitations 08/13/2015   PVC's (premature ventricular contractions) 03/28/2016   Noted on prior monitor but not quantified   Tobacco use disorder 09/19/2020   Vertigo 09/25/2016    Past Surgical History:   Procedure Laterality Date   FOOT SURGERY     has pin and screws   LEFT AND RIGHT HEART CATHETERIZATION WITH CORONARY ANGIOGRAM N/A 08/04/2012   Procedure: LEFT AND RIGHT HEART CATHETERIZATION WITH CORONARY ANGIOGRAM;  Surgeon: Candyce GORMAN Reek, MD;  Location: [facility] CATH LAB;  Service: Cardiovascular;  Laterality: N/A;    Social History:  reports that he quit smoking about 18 years ago. His smoking use included cigarettes. He started smoking about 43 years ago. He has a 25 pack-year smoking history. He has never used smokeless tobacco. He reports that he does not drink alcohol and does not use drugs.  Allergies: Allergies[1]  Family History:  Family History  Problem Relation Age of Onset   Diabetes Mother    Anxiety disorder Brother    Depression Son    Heart murmur Son    Heart attack Neg Hx    Hypertension Neg Hx    Stroke Neg Hx     Current Medications[2]  Review of Systems:  Negative unless indicated in HPI.   Physical Exam: Vitals:   03/14/24 1435 03/14/24 1440  BP: (!) 150/98 130/77  Pulse: 88   SpO2: 97%   Weight: 241 lb (109.3 kg)   Height: 5' 6 (1.676 m)     Body mass index is 38.9 kg/m.   Welcome to [employer] wellness visit   Visit info / Clinical Intake: Medicare Wellness Visit Type:: Welcome to [employer] (IPPE) Persons participating in visit and providing information:: patient Medicare  Wellness Visit Mode:: In-person (required for WTM) Interpreter Needed?: No Pre-visit prep was completed: yes AWV questionnaire completed by patient prior to visit?: no Living arrangements:: lives with spouse/significant other Patient's Overall Health Status Rating: good Typical amount of pain: some Does pain affect daily life?: no Are you currently prescribed opioids?: no  Dietary Habits and Nutritional Risks How many meals a day?: 3 Eats fruit and vegetables daily?: yes Most meals are obtained by: preparing own meals In the last 2 weeks, have you had any of the  following?: none Diabetic:: (!) yes Any non-healing wounds?: no How often do you check your BS?: as needed Would you like to be referred to a Nutritionist or for Diabetic Management? : no  Functional Status Activities of Daily Living (to include ambulation/medication): Independent Ambulation: Independent Medication Administration: Independent Home Management (perform basic housework or laundry): Independent Manage your own finances?: yes Primary transportation is: driving Concerns about vision?: no *vision screening is required for WTM* Concerns about hearing?: no  Fall Screening Falls in the past year?: 0 Number of falls in past year: 0 Was there an injury with Fall?: 0 Fall Risk Category Calculator: 0 Patient Fall Risk Level: Low Fall Risk  Fall Risk Patient at Risk for Falls Due to: No Fall Risks Fall risk Follow up: Falls evaluation completed  Home and Transportation Safety: All rugs have non-skid backing?: (!) no All stairs or steps have railings?: yes Grab bars in the bathtub or shower?: yes Have non-skid surface in bathtub or shower?: yes Good home lighting?: yes Regular seat belt use?: yes Hospital stays in the last year:: no  Cognitive Assessment Difficulty concentrating, remembering, or making decisions? : no Will 6CIT or Mini Cog be Completed: yes What year is it?: 0 points What month is it?: 0 points Give patient an address phrase to remember (5 components): the cow jumped over the moon About what time is it?: 0 points Count backwards from 20 to 1: 0 points Say the months of the year in reverse: 0 points Repeat the address phrase from earlier: 0 points 6 CIT Score: 0 points  Advance Directives (For Healthcare) Does Patient Have a Medical Advance Directive?: No Would patient like information on creating a medical advance directive?: No - Patient declined  Reviewed/Updated  Reviewed/Updated: Reviewed All (Medical, Surgical, Family, Medications,  Allergies, Care Teams, Patient Goals)    Vision Screening   Right eye Left eye Both eyes  Without correction 20/30 20/30 20/30   With correction         Depression/mood:  Flowsheet Row Office Visit from 01/14/2023 in [facility]  PHQ-9 Total Score 3        Counseling: Counseling given: Not Answered     Lab orders based on risk factors: Laboratory update will be reviewed     Screening: Patient provided with a written and personalized 5-10 year screening schedule in the AVS. Health Maintenance  Topic Date Due   Eye exam for diabetics  Never done   Yearly kidney health urinalysis for diabetes  Never done   DTaP/Tdap/Td vaccine (1 - Tdap) Never done   Pneumococcal Vaccine for age over 73 (1 of 2 - PCV) Never done   Cologuard (Stool DNA test)  Never done   Zoster (Shingles) Vaccine (1 of 2) Never done   Flu Shot  Never done   Yearly kidney function blood test for diabetes  01/14/2024   Hemoglobin A1C  05/20/2024   Complete foot exam  11/17/2024   Medicare Annual Wellness Visit  03/14/2025   Hepatitis C Screening  Completed   HIV Screening  Completed   Hepatitis B Vaccine  Aged Out   Meningitis B Vaccine  Aged Out   COVID-19 Vaccine  Discontinued     Provider List Update: Patient Care Team    Relationship Specialty Notifications Start End  Theophilus Andrews, Tully GRADE, MD PCP - General Internal Medicine  06/26/21        I have personally reviewed and noted the following in the patients chart:   Medical and social history Use of alcohol, tobacco or illicit drugs  Current medications and supplements Functional ability and status Nutritional status Physical activity Advanced directives List of other physicians Hospitalizations, surgeries, and ER visits in previous 12 months Vitals Screenings to include cognitive, depression, and falls Referrals and appointments  In addition, I have reviewed and discussed with patient certain  preventive protocols, quality metrics, and best practice recommendations. A written personalized care plan for preventive services as well as general preventive health recommendations were provided to patient.   Impression and Plan:  Welcome to Medicare preventive visit -     PSA; Future  Essential hypertension -     CBC with Differential/Platelet; Future -     Comprehensive metabolic panel with GFR; Future  Hyperlipidemia, unspecified hyperlipidemia type -     Lipid panel; Future  Vitamin D  deficiency -     VITAMIN D  25 Hydroxy (Vit-D Deficiency, Fractures); Future  Vitamin B12 deficiency -     Vitamin B12; Future  Type 2 diabetes mellitus with other specified complication, unspecified whether long term insulin  use (HCC)  Uncontrolled type 2 diabetes mellitus with hyperglycemia, with long-term current use of insulin  (HCC) -     Hemoglobin A1c; Future -     Microalbumin / creatinine urine ratio; Future -     Diclofenac  Sodium; Apply 2 g topically daily.  Dispense: 150 g; Refill: 2  CKD stage 3 due to type 2 diabetes mellitus (HCC)  Other orders -     FreeStyle Libre 3 Plus Sensor; Change sensor every 15 days.  Dispense: 6 each; Refill: 3  -Recommend routine eye and dental care. -Healthy lifestyle discussed in detail. -Labs to be updated today. -Prostate cancer screening: PSA today Health Maintenance  Topic Date Due   Eye exam for diabetics  Never done   Yearly kidney health urinalysis for diabetes  Never done   DTaP/Tdap/Td vaccine (1 - Tdap) Never done   Pneumococcal Vaccine for age over 77 (1 of 2 - PCV) Never done   Cologuard (Stool DNA test)  Never done   Zoster (Shingles) Vaccine (1 of 2) Never done   Flu Shot  Never done   Yearly kidney function blood test for diabetes  01/14/2024   Hemoglobin A1C  05/20/2024   Complete foot exam   11/17/2024   Medicare Annual Wellness Visit  03/14/2025   Hepatitis C Screening  Completed   HIV Screening  Completed   Hepatitis  B Vaccine  Aged Out   Meningitis B Vaccine  Aged Out   COVID-19 Vaccine  Discontinued     - Declines all immunizations and cancer screenings.    Tully Theophilus Andrews, MD [facility]     [1]  Allergies Allergen Reactions   Celebrex [Celecoxib] Swelling    Swelling in face   Rosuvastatin  Hives   Entresto  [Sacubitril -Valsartan ] Other (See Comments)    Dizziness  [2]  Current  Outpatient Medications:    albuterol  (PROVENTIL ) (2.5 MG/3ML) 0.083% nebulizer solution, Take 2.5 mg by nebulization every 6 (six) hours as needed for wheezing or shortness of breath., Disp: , Rfl:    albuterol  (VENTOLIN  HFA) 108 (90 Base) MCG/ACT inhaler, Inhale 2 puffs into the lungs every 6 (six) hours as needed for wheezing or shortness of breath., Disp: 18 g, Rfl: 2   aspirin  EC 81 MG tablet, Take 81 mg by mouth daily. Swallow whole., Disp: , Rfl:    carvedilol  (COREG ) 6.25 MG tablet, TAKE 1 TABLET BY MOUTH TWICE DAILY WITH A MEAL, Disp: 180 tablet, Rfl: 0   Continuous Glucose Sensor (FREESTYLE LIBRE 3 PLUS SENSOR) MISC, Change sensor every 15 days., Disp: 6 each, Rfl: 3   cyanocobalamin  (VITAMIN B12) 1000 MCG/ML injection, Inject 1 ml into the muscle once a week for a month.  Then injected 1 ml once a month thereafter, Disp: 6 mL, Rfl: 11   insulin  aspart (NOVOLOG  FLEXPEN) 100 UNIT/ML FlexPen, 0-20 Units, Subcutaneous, 3 times daily with meals CBG < 70: Implement Hypoglycemia measures CBG 70 - 120: 0 units CBG 121 - 150: 3 units CBG 151 - 200: 4 units CBG 201 - 250: 7 units CBG 251 - 300: 11 units CBG 301 - 350: 15 units CBG 351 - 400: 20 units CBG > 400: call MD, Disp: 15 mL, Rfl: 11   Insulin  Pen Needle 31G X 5 MM MISC, 1 each by Does not apply route daily., Disp: 100 each, Rfl: 3   LINZESS  145 MCG CAPS capsule, TAKE 1 CAPSULE BY MOUTH ONCE DAILY AS NEEDED FOR CONSTIPATION, Disp: 90 capsule, Rfl: 0   NEEDLE, DISP, 25 G (B-D DISP NEEDLE 25GX1) 25G X 1 MISC, Use for b12 injections,  Disp: 100 each, Rfl: 2   OZEMPIC , 2 MG/DOSE, 8 MG/3ML SOPN, INJECT 2MG   SUBCUTANEOUSLY ONCE A WEEK, Disp: 3 mL, Rfl: 0   torsemide  (DEMADEX ) 20 MG tablet, TAKE 1 TABLET BY MOUTH EVERY OTHER DAY, Disp: 45 tablet, Rfl: 2   Tuberculin-Allergy Syringes (VANISHPOINT TUBERCULIN SYRINGE) 25G X 1 1 ML MISC, USE FOR B12 INJECTIONS, Disp: 100 each, Rfl: 3   diclofenac  Sodium (VOLTAREN ) 1 % GEL, Apply 2 g topically daily., Disp: 150 g, Rfl: 2

## 2024-03-15 ENCOUNTER — Other Ambulatory Visit (HOSPITAL_COMMUNITY): Payer: Self-pay

## 2024-03-15 ENCOUNTER — Ambulatory Visit: Payer: Self-pay | Admitting: Internal Medicine

## 2024-03-15 DIAGNOSIS — E559 Vitamin D deficiency, unspecified: Secondary | ICD-10-CM

## 2024-03-15 DIAGNOSIS — E538 Deficiency of other specified B group vitamins: Secondary | ICD-10-CM

## 2024-03-15 MED ORDER — VITAMIN D (ERGOCALCIFEROL) 1.25 MG (50000 UNIT) PO CAPS: 50000.0000 [IU] | ORAL_CAPSULE | ORAL | 0 refills | Status: AC

## 2024-03-15 MED ORDER — CYANOCOBALAMIN 1000 MCG/ML IJ SOLN: INTRAMUSCULAR | 11 refills | Status: AC

## 2024-03-15 MED ORDER — BD SAFETYGLIDE SYRINGE/NEEDLE 25G X 1" 3 ML MISC: 3 refills | Status: AC

## 2024-03-15 MED ORDER — DAPAGLIFLOZIN PROPANEDIOL 5 MG PO TABS: 5.0000 mg | ORAL_TABLET | Freq: Every day | ORAL | 1 refills | Status: AC

## 2024-03-16 MED ORDER — ROSUVASTATIN CALCIUM 5 MG PO TABS: 5.0000 mg | ORAL_TABLET | Freq: Every day | ORAL | 1 refills | Status: AC

## 2024-03-21 ENCOUNTER — Other Ambulatory Visit (HOSPITAL_COMMUNITY): Payer: Self-pay

## 2024-03-21 ENCOUNTER — Telehealth: Payer: Self-pay

## 2024-03-21 NOTE — Telephone Encounter (Signed)
 Pharmacy Patient Advocate Encounter   Received notification from Onbase that prior authorization for Cyanocobalamin  1000 inj is required/requested.   Insurance verification completed.   The patient is insured through [employer].   Per test claim: PA required; PA submitted to above mentioned insurance via Latent Key/confirmation #/EOC [id number] Status is pending

## 2024-03-21 NOTE — Telephone Encounter (Signed)
 Pharmacy Patient Advocate Encounter  Received notification from [facility] that Prior Authorization for Cyancobalamin 1000 has been DENIED.  Full denial letter will be uploaded to the media tab. See denial reason below.    PA #/Case ID/Reference #: # [id number]

## 2024-03-26 ENCOUNTER — Other Ambulatory Visit: Payer: Self-pay | Admitting: Internal Medicine

## 2024-03-28 ENCOUNTER — Other Ambulatory Visit (HOSPITAL_BASED_OUTPATIENT_CLINIC_OR_DEPARTMENT_OTHER): Payer: Self-pay

## 2024-04-03 ENCOUNTER — Other Ambulatory Visit: Payer: Self-pay | Admitting: Internal Medicine

## 2024-04-03 DIAGNOSIS — E1169 Type 2 diabetes mellitus with other specified complication: Secondary | ICD-10-CM

## 2024-04-14 ENCOUNTER — Other Ambulatory Visit (HOSPITAL_BASED_OUTPATIENT_CLINIC_OR_DEPARTMENT_OTHER): Payer: Self-pay | Admitting: Cardiovascular Disease

## 2024-04-14 ENCOUNTER — Other Ambulatory Visit: Payer: Self-pay | Admitting: Internal Medicine

## 2024-04-15 NOTE — Telephone Encounter (Signed)
 In accordance with refill protocols, please review and address the following requirements before this medication refill can be authorized:  Labs  Mg labs requested, it was not done. Pt was called, message left, for pt to call back for pt to make overdue appt.

## 2024-04-21 ENCOUNTER — Other Ambulatory Visit (HOSPITAL_BASED_OUTPATIENT_CLINIC_OR_DEPARTMENT_OTHER): Payer: Self-pay

## 2024-05-01 ENCOUNTER — Other Ambulatory Visit: Payer: Self-pay | Admitting: Internal Medicine

## 2024-05-01 DIAGNOSIS — E1169 Type 2 diabetes mellitus with other specified complication: Secondary | ICD-10-CM

## 2024-05-30 ENCOUNTER — Ambulatory Visit: Admitting: "Endocrinology

## 2024-06-13 ENCOUNTER — Ambulatory Visit: Admitting: Internal Medicine
